# Patient Record
Sex: Female | Born: 1955 | Race: White | Hispanic: No | Smoking: Never smoker
Health system: Southern US, Community
[De-identification: ages and names within clinical notes are randomized; demographics above are authoritative.]

## PROBLEM LIST (undated history)

## (undated) DIAGNOSIS — R51 Headache with orthostatic component, not elsewhere classified: Secondary | ICD-10-CM

## (undated) DIAGNOSIS — T7840XA Allergy, unspecified, initial encounter: Secondary | ICD-10-CM

## (undated) DIAGNOSIS — F32A Depression, unspecified: Secondary | ICD-10-CM

## (undated) DIAGNOSIS — Z8669 Personal history of other diseases of the nervous system and sense organs: Secondary | ICD-10-CM

## (undated) DIAGNOSIS — E039 Hypothyroidism, unspecified: Secondary | ICD-10-CM

## (undated) DIAGNOSIS — Z9189 Other specified personal risk factors, not elsewhere classified: Secondary | ICD-10-CM

## (undated) DIAGNOSIS — Z8719 Personal history of other diseases of the digestive system: Secondary | ICD-10-CM

## (undated) DIAGNOSIS — F419 Anxiety disorder, unspecified: Secondary | ICD-10-CM

## (undated) DIAGNOSIS — J449 Chronic obstructive pulmonary disease, unspecified: Secondary | ICD-10-CM

## (undated) DIAGNOSIS — M1990 Unspecified osteoarthritis, unspecified site: Secondary | ICD-10-CM

## (undated) DIAGNOSIS — J40 Bronchitis, not specified as acute or chronic: Secondary | ICD-10-CM

## (undated) DIAGNOSIS — Z98811 Dental restoration status: Secondary | ICD-10-CM

## (undated) DIAGNOSIS — Z9109 Other allergy status, other than to drugs and biological substances: Secondary | ICD-10-CM

## (undated) DIAGNOSIS — Z9289 Personal history of other medical treatment: Secondary | ICD-10-CM

## (undated) DIAGNOSIS — M2242 Chondromalacia patellae, left knee: Secondary | ICD-10-CM

## (undated) DIAGNOSIS — K219 Gastro-esophageal reflux disease without esophagitis: Secondary | ICD-10-CM

## (undated) DIAGNOSIS — R7302 Impaired glucose tolerance (oral): Secondary | ICD-10-CM

## (undated) DIAGNOSIS — F329 Major depressive disorder, single episode, unspecified: Secondary | ICD-10-CM

## (undated) DIAGNOSIS — Z87898 Personal history of other specified conditions: Secondary | ICD-10-CM

## (undated) HISTORY — PX: WISDOM TOOTH EXTRACTION: SHX21

## (undated) HISTORY — DX: Personal history of other specified conditions: Z87.898

## (undated) HISTORY — DX: Gastro-esophageal reflux disease without esophagitis: K21.9

## (undated) HISTORY — DX: Personal history of other medical treatment: Z92.89

## (undated) HISTORY — PX: COLONOSCOPY: SHX174

## (undated) HISTORY — DX: Allergy, unspecified, initial encounter: T78.40XA

## (undated) HISTORY — PX: TONSILLECTOMY AND ADENOIDECTOMY: SHX28

## (undated) HISTORY — DX: Impaired glucose tolerance (oral): R73.02

## (undated) HISTORY — DX: Chronic obstructive pulmonary disease, unspecified: J44.9

## (undated) HISTORY — DX: Hypothyroidism, unspecified: E03.9

---

## 1984-12-12 HISTORY — PX: TUBAL LIGATION: SHX77

## 2010-06-17 LAB — PULMONARY FUNCTION TEST

## 2011-04-21 ENCOUNTER — Inpatient Hospital Stay (HOSPITAL_COMMUNITY)
Admission: AD | Admit: 2011-04-21 | Discharge: 2011-04-23 | DRG: 419 | Disposition: A | Discharge status: Home / Self Care | Payer: 59 | Source: Ambulatory Visit | Attending: Surgery | Admitting: Surgery

## 2011-04-21 DIAGNOSIS — R112 Nausea with vomiting, unspecified: Secondary | ICD-10-CM | POA: Diagnosis present

## 2011-04-21 DIAGNOSIS — R1011 Right upper quadrant pain: Secondary | ICD-10-CM | POA: Diagnosis present

## 2011-04-21 DIAGNOSIS — K8010 Calculus of gallbladder with chronic cholecystitis without obstruction: Principal | ICD-10-CM | POA: Diagnosis present

## 2011-04-21 DIAGNOSIS — E669 Obesity, unspecified: Secondary | ICD-10-CM | POA: Diagnosis present

## 2011-04-21 HISTORY — PX: CHOLECYSTECTOMY: SHX55

## 2011-04-21 LAB — CBC
HCT: 37.4 % (ref 36.0–46.0)
Hemoglobin: 12.6 g/dL (ref 12.0–15.0)
MCH: 30.9 pg (ref 26.0–34.0)
MCHC: 33.7 g/dL (ref 30.0–36.0)
MCV: 91.7 fL (ref 78.0–100.0)
Platelets: 309 10*3/uL (ref 150–400)
RBC: 4.08 MIL/uL (ref 3.87–5.11)
RDW: 12.4 % (ref 11.5–15.5)
WBC: 6.3 10*3/uL (ref 4.0–10.5)

## 2011-04-21 LAB — COMPREHENSIVE METABOLIC PANEL
ALT: 24 U/L (ref 0–35)
AST: 21 U/L (ref 0–37)
Albumin: 3.8 g/dL (ref 3.5–5.2)
Alkaline Phosphatase: 75 U/L (ref 39–117)
BUN: 18 mg/dL (ref 6–23)
CO2: 29 mEq/L (ref 19–32)
Calcium: 9.3 mg/dL (ref 8.4–10.5)
Chloride: 105 mEq/L (ref 96–112)
Creatinine, Ser: 0.73 mg/dL (ref 0.4–1.2)
GFR calc Af Amer: >60 mL/min (ref >60)
GFR calc non Af Amer: >60 mL/min (ref >60)
Glucose, Bld: 90 mg/dL (ref 70–99)
Potassium: 3.6 mEq/L (ref 3.5–5.1)
Sodium: 141 mEq/L (ref 135–145)
Total Bilirubin: 0.2 mg/dL — ABNORMAL LOW (ref 0.3–1.2)
Total Protein: 6.2 g/dL (ref 6.0–8.3)

## 2011-04-22 ENCOUNTER — Other Ambulatory Visit: Payer: Self-pay | Admitting: Surgery

## 2011-04-22 ENCOUNTER — Inpatient Hospital Stay (HOSPITAL_COMMUNITY): Payer: 59

## 2011-04-22 LAB — SURGICAL PCR SCREEN
MRSA, PCR: NEGATIVE
Staphylococcus aureus: NEGATIVE

## 2011-04-22 NOTE — Op Note (Signed)
[name] [name], [name] [name]             ACCOUNT NO.:  [id number]  MEDICAL RECORD NO.:  [id number]           PATIENT TYPE:  I  LOCATION:  1528                         FACILITY:  [facility]  PHYSICIAN:  Sandria Bales. Ezzard Standing, M.D.  DATE OF BIRTH:  Jan 09, 1956  DATE OF PROCEDURE:  04/21/2011 DATE OF DISCHARGE:                              OPERATIVE REPORT   Tis is an unedited report.  For final edited dictation, please contact [facility]/Liz Katrinka Blazing.  PREOPERATIVE DIAGNOSES:  Cholecystitis, cholelithiasis.  POSTOPERATIVE DIAGNOSIS:  Edematous cholecystitis with cholelithiasis.  PROCEDURE:  Laparoscopic cholecystectomy with intraoperative cholangiogram.  SURGEON:  Sandria Bales. Ezzard Standing, M.D.  FIRST ASSISTANT:  Dr. Magnus Ivan.  ANESTHESIA:  General endotracheal.  ESTIMATED BLOOD LOSS:  Minimal.  INDICATIONS FOR PROCEDURE:  Ms. Sol is a 55 year old white female who sees Dr. Madelyn Flavors of [place], [place] as per primary medical doctor and has seen Dr. Tillman Sers of [facility]. She had epigastric left upper quadrant abdominal pain and has been found to have symptomatic cholelithiasis.  She originally was to have surgery in [place], because of insurance issues is being cared for in [place].  She presented to our urgent office with acute abdominal pain yesterday, was admitted by Dr. Magnus Ivan and now comes for attempted laparoscopic cholecystectomy.  The indications, potential complications of gallbladder surgery were explained to the patient.  Potential complications include but are not limited to bleeding, infection, bile duct injury and possibility of open surgery.  OPERATIVE NOTE:  The patient was placed in a supine position.  Her abdomen was prepped with ChloraPrep.  She was sterilely draped.  She was in room #6.    A time-out was held and surgical checklist run.  I made an infraumbilical incision with sharp dissection carried down the abdominal  cavity.  A 12-mm Hassan trocar was secured with a 0 Vicryl suture.  I then placed an 11-mm trocar in subxiphoid location, 5-mm in the right mid subcostal and 5 mm in the right lateral subcostal location.  I visualized the right and left lobes of liver, unremarkable.  The stomach was unremarkable.  The bowel that I could see was unremarkable.  Her gallbladder was somewhat whitish and had some edema consistent with acute edematous cholecystitis.  I identified the gallbladder and cystic duct junction.  I divided the cystic artery with 2 clips.  I placed a clip on the gallbladder side of the cystic duct and shot intraoperative cholangiogram.  The cholangiogram was shot using a cutoff taut catheter inserted through a 14-gauge Jelco catheter and into the side of the cut cystic duct.  The catheter was secured with an Endoclip.  I used about 15 cc of half- strength Hypaque solution which showed free flow down the cystic duct into the common bile duct up the hepatic radicals and down into the duodenum.  There was no filling defect, mass or leak and this was feltto be normal intraoperative cholangiogram.  The taut catheter was then removed.  The cystic duct triply clipped and divided.  The patient had another branch of the cystic artery which was posterior which I doubly clipped and divided, and  the gallbladder sharply and bluntly dissected from the gallbladder bed using primarily hook Bovie coagulation.  She did have edema of the gallbladder wall.  I reinspected the gallbladder wall and the triangle of Calot at the end of the procedure and there was no bleeding or bile leak from either these areas.  The gallbladder was placed in EndoCatch bag and delivered through the umbilicus.  It had in it several large stones probably 1 to 1.5 cm in size.  There was a single adhesive band to the underneath surface of the umbilicus may have from a prior tubal ligation.  This was divided.  The trocars were  then removed in turn.  There was no bleeding in any trocar site.  The umbilical port was closed with a 0 Vicryl suture.  The skin at each port closed with 5-0 Vicryl suture, painted with Dermabond sterilely dressed.  The patient tolerated the procedure well, was transferred to recovery room in good condition.  I used about 30 cc of 0.25% Marcaine as a local anesthetic at the incision sites.  Her final sponge and needle counts were correct at the end of the case.  She wants to try to go home today.  So we will see how she does in recovery and then go from there.   Sandria Bales. Ezzard Standing, M.D., FACS   DHN/MEDQ  D:  04/22/2011  T:  04/22/2011  Job:  [id number]  cc:   Dr. Madelyn Flavors, [place], [place]  Dr. Tillman Sers, [facility]  Electronically Signed by Ovidio Kin M.D. on 04/22/2011 03:39:03 PM

## 2011-04-27 NOTE — H&P (Signed)
  [name] [name], [name] [name]             ACCOUNT NO.:  [id number]  MEDICAL RECORD NO.:  [id number]           PATIENT TYPE:  I  LOCATION:  1528                         FACILITY:  [facility]  PHYSICIAN:  Abigail Miyamoto, M.D. DATE OF BIRTH:  07-Nov-1956  DATE OF ADMISSION:  04/21/2011 DATE OF DISCHARGE:                             HISTORY & PHYSICAL   CHIEF COMPLAINT:  Right upper quadrant abdominal pain, nausea and vomiting.  HISTORY:  This is a 55 year old female with known gallstones.  She was actually scheduled to have surgery in [place], [place] next week. She, however, has become acutely ill.  She works in the [employer], has presented here emergently.  She has had several attacks since she has been diagnosed of gallstones, but this is the worse one yet.  Her pain has been unrelenting in the epigastric area, it does actually hurt to her left side.  She has had nausea and vomiting with this.  Otherwise, she is without complaints.  PAST MEDICAL HISTORY: 1. Bronchiectasis. 2. Sliding hiatal hernia. 3. Diverticulosis. 4. Gastritis. 5. She has had T and A as well as bilateral tubal ligation.  MEDICATIONS:  Singulair, Allegra, Nasonex, Robinul, multivitamins, etc.  ALLERGIES:  No known drug allergies.  FAMILY HISTORY:  Noncontributory.  SOCIAL HISTORY:  She does not smoke, does drink alcohol.  REVIEW OF SYSTEMS:  A 16-point review of systems signed on chart has been reviewed with the patient.  It is negative from cardiopulmonary standpoint except for bronchiectasis.  PHYSICAL EXAMINATION:  GENERAL:  This is an ill-appearing female, in no acute distress. HEENT:  Height 5 feet 1inch, weight 193 pounds, blood pressure is 145/88, pulse 67, temperature 98.0.  Eyes, anicteric.  Pupils reactive bilaterally.  ENT, external ears, nose are normal.  Hearing is normal. Oropharynx is clear. NECK:  Supple.  There is no cervical adenopathy.  There is no thyromegaly. LUNGS:  Clear  to auscultation bilaterally.  Normal respiratory effort. CARDIOVASCULAR:  Regular rate and rhythm.  There are no murmurs.  There is no peripheral edema. ABDOMEN:  Soft.  There is tenderness with guarding in the epigastrium and right upper quadrant.  There are no hernias.  There is no organomegaly. EXTREMITIES:  Warm and well perfused.  No edema, clubbing, or cyanosis.  DATA REVIEWED:  I have her notes from [place], [place].  These include CAT scan and ultrasound showing to have cholelithiasis.  Most of these were dated from September last year.  IMPRESSION:  This is a patient with cholelithiasis and I suspect acute cholecystitis.  At this point, she is being admitted to the hospital.  I will place her on IV antibiotics and I will get a CBC and CMP.  I have notified Dr. Ovidio Kin for admission, no doubt she will need a laparoscopic cholecystectomy this admission.     Abigail Miyamoto, M.D.     DB/MEDQ  D:  04/21/2011  T:  04/22/2011  Job:  [id number]  Electronically Signed by Abigail Miyamoto M.D. on 04/27/2011 05:39:40 PM

## 2011-05-03 ENCOUNTER — Emergency Department (HOSPITAL_COMMUNITY)
Admission: EM | Admit: 2011-05-03 | Discharge: 2011-05-03 | Disposition: A | Discharge status: Home / Self Care | Payer: No Typology Code available for payment source | Attending: Emergency Medicine | Admitting: Emergency Medicine

## 2011-05-03 DIAGNOSIS — R52 Pain, unspecified: Secondary | ICD-10-CM | POA: Insufficient documentation

## 2011-05-03 DIAGNOSIS — J4489 Other specified chronic obstructive pulmonary disease: Secondary | ICD-10-CM | POA: Insufficient documentation

## 2011-05-03 DIAGNOSIS — Z043 Encounter for examination and observation following other accident: Secondary | ICD-10-CM | POA: Insufficient documentation

## 2011-05-03 DIAGNOSIS — J449 Chronic obstructive pulmonary disease, unspecified: Secondary | ICD-10-CM | POA: Insufficient documentation

## 2011-05-10 NOTE — Discharge Summary (Signed)
  NAMEJOURNIE, [name] [name]             ACCOUNT NO.:  [id number]  MEDICAL RECORD NO.:  [id number]           PATIENT TYPE:  I  LOCATION:  1528                         [facility]ACILITY:  [facility]  PHYSICIAN:  Sandria Bales. Ezzard Standing, M.D.  DATE OF BIRTH:  [birth year]  DATE OF ADMISSION:  04/21/2011 DATE OF DISCHARGE:  04/23/2011                              DISCHARGE SUMMARY  DISCHARGE DIAGNOSES: 1. Cholecystitis with cholelithiasis. 2. Bronchiectasis. 3. Sliding hiatal hernia. 4. History of diverticulosis. 5. History of gastritis.  OPERATIONS PERFORMED:  The patient underwent a laparoscopic cholecystectomy with intraoperative cholangiogram by Dr. Ovidio Kin on Apr 22, 2011.  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 Madelyn Flavors in [place], [place] as her primary medical doctor, sees Dr. Allena Katz in [place], [place], from a GI standpoint.  She has had left upper quadrant abdominal pain.  She has been through a colonoscopy, endoscopy, CT scans which showed no other specific cause for her symptoms and has known gallstones.  She was originally scheduled for surgery in [place] by a surgeon in that area, but her insurance would not cover his care.She then came and was seen by Dr. Magnus Ivan at [facility] for symptomatic cholelithiasis, possible cholecystitis.  Since he was coming off a call, and I was on-call, I took care of [name] [name].  So, on Apr 22, 2011, she was taken to the operating room where she underwent a laparoscopic cholecystectomy with intraoperative cholangiogram.  She did have an edematous cholecystitis.  By Apr 23, 2011, she was afebrile.  Her nausea which she had early after surgery resolved and she was ready for discharge.  DISCHARGE INSTRUCTIONS:   Included that she should not drive for 2 or 3 days.   She is to return to work on Apr 29, 2011.     She is on low-fat diet.  She will start showering on Sunday, Apr 24, 2011.  She is to see me back in 2 or 3 weeks for followup.  DISCHARGE MEDICATIONS:  She could resume her home medicines which included, acidophilus, albuterol, Allegra, Antivert, Dexilant, Lexapro, Nasonex, Robinul Forte, Singulair, and then she uses vitamins over-the- counter such as B12, and then Zantac.  Her final pathology showed cholelithiasis with cholecystitis.  Her discharge condition was good.   Sandria Bales. Ezzard Standing, M.D., FACS   DHN/MEDQ  D:  05/09/2011  T:  05/09/2011  Job:  [id number]  cc:   Dr. Marisue [place], [place]  Dr. Tillman Sers  Electronically Signed by Ovidio Kin M.D. on 05/10/2011 07:47:43 AM

## 2011-05-21 ENCOUNTER — Emergency Department (HOSPITAL_COMMUNITY)
Admission: EM | Admit: 2011-05-21 | Discharge: 2011-05-21 | Disposition: A | Discharge status: Home / Self Care | Payer: 59 | Attending: Emergency Medicine | Admitting: Emergency Medicine

## 2011-05-21 DIAGNOSIS — J449 Chronic obstructive pulmonary disease, unspecified: Secondary | ICD-10-CM | POA: Insufficient documentation

## 2011-05-21 DIAGNOSIS — I808 Phlebitis and thrombophlebitis of other sites: Secondary | ICD-10-CM | POA: Insufficient documentation

## 2011-05-21 DIAGNOSIS — J4489 Other specified chronic obstructive pulmonary disease: Secondary | ICD-10-CM | POA: Insufficient documentation

## 2012-05-03 ENCOUNTER — Emergency Department (HOSPITAL_COMMUNITY)
Admission: EM | Admit: 2012-05-03 | Discharge: 2012-05-03 | Disposition: A | Discharge status: Nursing Home (Includes ICF) | Payer: 59 | Source: Home / Self Care | Attending: Family Medicine | Admitting: Family Medicine

## 2012-05-03 ENCOUNTER — Emergency Department (INDEPENDENT_AMBULATORY_CARE_PROVIDER_SITE_OTHER): Payer: 59

## 2012-05-03 ENCOUNTER — Encounter (HOSPITAL_COMMUNITY): Payer: Self-pay | Admitting: *Deleted

## 2012-05-03 ENCOUNTER — Emergency Department (HOSPITAL_COMMUNITY)
Admission: EM | Admit: 2012-05-03 | Discharge: 2012-05-03 | Disposition: A | Discharge status: Home / Self Care | Payer: 59 | Attending: Emergency Medicine | Admitting: Emergency Medicine

## 2012-05-03 DIAGNOSIS — J189 Pneumonia, unspecified organism: Secondary | ICD-10-CM | POA: Insufficient documentation

## 2012-05-03 DIAGNOSIS — J470 Bronchiectasis with acute lower respiratory infection: Secondary | ICD-10-CM

## 2012-05-03 DIAGNOSIS — J471 Bronchiectasis with (acute) exacerbation: Secondary | ICD-10-CM

## 2012-05-03 DIAGNOSIS — R05 Cough: Secondary | ICD-10-CM | POA: Insufficient documentation

## 2012-05-03 DIAGNOSIS — R059 Cough, unspecified: Secondary | ICD-10-CM | POA: Insufficient documentation

## 2012-05-03 DIAGNOSIS — R0602 Shortness of breath: Secondary | ICD-10-CM | POA: Insufficient documentation

## 2012-05-03 LAB — CBC
HCT: 39.7 % (ref 36.0–46.0)
Hemoglobin: 13.5 g/dL (ref 12.0–15.0)
MCH: 31.7 pg (ref 26.0–34.0)
MCHC: 34 g/dL (ref 30.0–36.0)
MCV: 93.2 fL (ref 78.0–100.0)
Platelets: 299 10*3/uL (ref 150–400)
RBC: 4.26 MIL/uL (ref 3.87–5.11)
RDW: 13.2 % (ref 11.5–15.5)
WBC: 5.4 10*3/uL (ref 4.0–10.5)

## 2012-05-03 LAB — DIFFERENTIAL
Basophils Absolute: 0 10*3/uL (ref 0.0–0.1)
Basophils Relative: 0 % (ref 0–1)
Eosinophils Absolute: 0 10*3/uL (ref 0.0–0.7)
Eosinophils Relative: 1 % (ref 0–5)
Lymphocytes Relative: 31 % (ref 12–46)
Lymphs Abs: 1.7 10*3/uL (ref 0.7–4.0)
Monocytes Absolute: 0.3 10*3/uL (ref 0.1–1.0)
Monocytes Relative: 5 % (ref 3–12)
Neutro Abs: 3.4 10*3/uL (ref 1.7–7.7)
Neutrophils Relative %: 63 % (ref 43–77)

## 2012-05-03 LAB — BASIC METABOLIC PANEL
BUN: 14 mg/dL (ref 6–23)
CO2: 29 mEq/L (ref 19–32)
Calcium: 9.1 mg/dL (ref 8.4–10.5)
Chloride: 102 mEq/L (ref 96–112)
Creatinine, Ser: 0.83 mg/dL (ref 0.50–1.10)
GFR calc Af Amer: 90 mL/min — ABNORMAL LOW (ref >90)
GFR calc non Af Amer: 77 mL/min — ABNORMAL LOW (ref >90)
Glucose, Bld: 96 mg/dL (ref 70–99)
Potassium: 4 mEq/L (ref 3.5–5.1)
Sodium: 139 mEq/L (ref 135–145)

## 2012-05-03 LAB — LACTIC ACID, PLASMA: Lactic Acid, Venous: 0.5 mmol/L (ref 0.5–2.2)

## 2012-05-03 MED ORDER — IPRATROPIUM BROMIDE 0.02 % IN SOLN
0.5000 mg | Freq: Once | RESPIRATORY_TRACT | Status: AC
  Administered 2012-05-03: 0.5 mg via RESPIRATORY_TRACT
  Filled 2012-05-03: qty 2.5

## 2012-05-03 MED ORDER — DEXTROSE 5 % IV SOLN
1.0000 g | Freq: Once | INTRAVENOUS | Status: AC
  Administered 2012-05-03: 1 g via INTRAVENOUS
  Filled 2012-05-03: qty 10

## 2012-05-03 MED ORDER — ALBUTEROL SULFATE (5 MG/ML) 0.5% IN NEBU
5.0000 mg | INHALATION_SOLUTION | Freq: Once | RESPIRATORY_TRACT | Status: AC
  Administered 2012-05-03: 5 mg via RESPIRATORY_TRACT
  Filled 2012-05-03: qty 1

## 2012-05-03 MED ORDER — AZITHROMYCIN 250 MG PO TABS
500.0000 mg | ORAL_TABLET | Freq: Once | ORAL | Status: AC
  Administered 2012-05-03: 500 mg via ORAL
  Filled 2012-05-03: qty 2

## 2012-05-03 MED ORDER — CLARITHROMYCIN ER 500 MG PO TB24: 1000.0000 mg | ORAL_TABLET | Freq: Every day | ORAL | Status: AC

## 2012-05-03 MED ORDER — SODIUM CHLORIDE 0.9 % IV SOLN
INTRAVENOUS | Status: DC
  Administered 2012-05-03: 21:00:00 via INTRAVENOUS

## 2012-05-03 NOTE — Discharge Instructions (Signed)

## 2012-05-03 NOTE — ED Notes (Signed)
Productive cough thick white

## 2012-05-03 NOTE — ED Notes (Signed)
Pt  Reports  Symptoms  Of   Cough  /  Congestion   Shortness  Of  Breath    For  About 4  Days   She      Has  Been talking  Her  Nebs  But  Has  Not cleared       Lung sounds  Are  Tight     She is  Sitting  Upright on  Exam table  In somewhat   Mod  Distress

## 2012-05-03 NOTE — ED Notes (Signed)
ucc transfer she has been ill for 7 days with a cough and feeling well low grade temp.  She was diagnosed with pneumonia today and sent here for further treatment

## 2012-05-03 NOTE — ED Notes (Signed)
Pt pulse ox while ambulating was 95-96% RA

## 2012-05-03 NOTE — ED Provider Notes (Signed)
History     CSN: [id number]  Arrival date & time 05/03/12  1605   First MD Initiated Contact with Patient 05/03/12 1611      Chief Complaint  Patient presents with  . Cough    (Consider location/radiation/quality/duration/timing/severity/associated sxs/prior treatment) Patient is a 56 y.o. female presenting with cough. The history is provided by the patient.  Cough This is a chronic problem. The current episode started more than 2 days ago. The problem has been gradually worsening. The cough is productive of sputum. There has been no fever. Associated symptoms include shortness of breath and wheezing. Treatments tried: home neb rx qid. The treatment provided no relief. She is not a smoker. Her past medical history is significant for bronchiectasis and COPD. Past medical history comments: 2 flares this year per pulmonologist in [place]..    Past Medical History  Diagnosis Date  . Bronchiectasis     Past Surgical History  Procedure Date  . Cholecystectomy     No family history on file.  History  Substance Use Topics  . Smoking status: Not on file  . Smokeless tobacco: Not on file  . Alcohol Use:     OB History    Grav Para Term Preterm Abortions TAB SAB Ect Mult Living                  Review of Systems  Constitutional: Negative.   HENT: Negative.   Respiratory: Positive for cough, shortness of breath and wheezing.     Allergies  Levaquin  Home Medications   Current Outpatient Rx  Name Route Sig Dispense Refill  . BUDESONIDE 0.5 MG/2ML IN SUSP Nebulization Take 0.5 mg by nebulization 2 (two) times daily.    Marland Kitchen CLARITHROMYCIN 500 MG PO TABS Oral Take 500 mg by mouth 2 (two) times daily.    Marland Kitchen FEXOFENADINE HCL 180 MG PO TABS Oral Take 180 mg by mouth daily.    . GUAIFENESIN 100 MG/5ML PO LIQD Oral Take 200 mg by mouth 3 (three) times daily as needed.    . IPRATROPIUM-ALBUTEROL 0.5-2.5 (3) MG/3ML IN SOLN Nebulization Take 3 mLs by nebulization.    . MOMETASONE  FUROATE 50 MCG/ACT NA SUSP Nasal Place 2 sprays into the nose daily.    Marland Kitchen OMEPRAZOLE 20 MG PO CPDR Oral Take 20 mg by mouth daily.    Marland Kitchen PREDNISONE 10 MG PO TABS Oral Take 10 mg by mouth daily.      BP 157/98  Pulse 82  Temp(Src) 99.1 F (37.3 C) (Oral)  Resp 20  SpO2 97%  Physical Exam  Nursing note and vitals reviewed. Constitutional: She appears well-developed and well-nourished.  HENT:  Head: Normocephalic.  Right Ear: External ear normal.  Left Ear: External ear normal.  Nose: Nose normal.  Mouth/Throat: Oropharynx is clear and moist.  Neck: Normal range of motion. Neck supple.  Cardiovascular: Regular rhythm and normal heart sounds.   Pulmonary/Chest: She has decreased breath sounds. She has wheezes.  Lymphadenopathy:    She has no cervical adenopathy.  Skin: Skin is warm and dry.    ED Course  Procedures (including critical care time)  Labs Reviewed - No data to display Dg Chest 2 View  05/03/2012  *RADIOLOGY REPORT*  Clinical Data: Persistent cough.  CHEST - 2 VIEW  Comparison: None.  Findings: Trachea is midline.  Heart size normal.  There is right basilar airspace disease.  Lungs are otherwise clear.  No pleural fluid.  IMPRESSION: Bibasilar pneumonia.  Follow-up  to clearing is recommended.  Original Report Authenticated By: Reyes Ivan, M.D.     1. Bronchiectasis with acute lower respiratory infection       MDM          Linna Hoff, MD 05/03/12 [phone]

## 2012-05-04 NOTE — ED Provider Notes (Signed)
History     CSN: [id number]  Arrival date & time 05/03/12  1739   First MD Initiated Contact with Patient 05/03/12 1823      Chief Complaint  Patient presents with  . Pneumonia    (Consider location/radiation/quality/duration/timing/severity/associated sxs/prior treatment) HPI Comments: [name] [name] is a 56 y.o. Female who has had a cough for 1 week, with SOB. No fever, N/V, weakness dizziness or CP. Similar illness x 2 in last 4 mos. Treated with Biaxin. Using usual home meds without relief. SoB worse with ambulation. No orthopnea  Patient is a 56 y.o. female presenting with pneumonia. The history is provided by the patient.  Pneumonia    Past Medical History  Diagnosis Date  . Bronchiectasis     Past Surgical History  Procedure Date  . Cholecystectomy     No family history on file.  History  Substance Use Topics  . Smoking status: Never Smoker   . Smokeless tobacco: Not on file  . Alcohol Use: No    OB History    Grav Para Term Preterm Abortions TAB SAB Ect Mult Living                  Review of Systems  All other systems reviewed and are negative.    Allergies  Levaquin  Home Medications   Current Outpatient Rx  Name Route Sig Dispense Refill  . BUDESONIDE 0.5 MG/2ML IN SUSP Nebulization Take 0.5 mg by nebulization 2 (two) times daily.    Marland Kitchen FEXOFENADINE HCL 180 MG PO TABS Oral Take 180 mg by mouth daily.    . GUAIFENESIN 100 MG/5ML PO LIQD Oral Take 200 mg by mouth 3 (three) times daily as needed. For cough    . IPRATROPIUM-ALBUTEROL 0.5-2.5 (3) MG/3ML IN SOLN Nebulization Take 3 mLs by nebulization every 4 (four) hours as needed. For shortness of breath    . MOMETASONE FUROATE 50 MCG/ACT NA SUSP Nasal Place 2 sprays into the nose daily.    Marland Kitchen OMEPRAZOLE 20 MG PO CPDR Oral Take 20 mg by mouth daily.    Marland Kitchen CLARITHROMYCIN ER 500 MG PO TB24 Oral Take 2 tablets (1,000 mg total) by mouth daily. 20 tablet 0    BP 134/78  Pulse 80  Temp(Src) 98.9 F  (37.2 C) (Oral)  Resp 24  SpO2 96%  Physical Exam  Nursing note and vitals reviewed. Constitutional: She is oriented to person, place, and time. She appears well-developed and well-nourished.  HENT:  Head: Normocephalic and atraumatic.  Eyes: Conjunctivae and EOM are normal. Pupils are equal, round, and reactive to light.  Neck: Normal range of motion and phonation normal. Neck supple.  Cardiovascular: Normal rate, regular rhythm and intact distal pulses.   Pulmonary/Chest: Effort normal. She has wheezes (scattered). She has no rales. She exhibits no tenderness.       Decreased BS bilateral.  Abdominal: Soft. She exhibits no distension. There is no tenderness. There is no guarding.  Musculoskeletal: Normal range of motion.  Neurological: She is alert and oriented to person, place, and time. She has normal strength. She exhibits normal muscle tone.  Skin: Skin is warm and dry.  Psychiatric: She has a normal mood and affect. Her behavior is normal. Judgment and thought content normal.    ED Course  Procedures (including critical care time)  ED Treatment: IVF, Neb, Rocephin and Zithromax.   Reevaluation:Repeat vitals and O2 Sat are normal. Pt feels better.    Labs Reviewed  BASIC METABOLIC PANEL -  Abnormal; Notable for the following:    GFR calc non Af Amer 77 (*)    GFR calc Af Amer 90 (*)    All other components within normal limits  CBC  DIFFERENTIAL  LACTIC ACID, PLASMA  LAB REPORT - SCANNED   Dg Chest 2 View  05/03/2012  *RADIOLOGY REPORT*  Clinical Data: Persistent cough.  CHEST - 2 VIEW  Comparison: None.  Findings: Trachea is midline.  Heart size normal.  There is right basilar airspace disease.  Lungs are otherwise clear.  No pleural fluid.  IMPRESSION: Bibasilar pneumonia.  Follow-up to clearing is recommended.  Original Report Authenticated By: Reyes Ivan, M.D.     1. Community acquired pneumonia       MDM  Recurrent PNE with Bronchospasm Pt is on  full spectrum treatment for COPD. She improved with a single Neb in ED so I think Steroids are not indicated. She has Nebs at home, and access to f/u care with her Pulmonologist.Doubt metabolic instability, serious bacterial infection or impending vascular collapse; the patient is stable for discharge.    Plan: Home Medications- Biaxin; Home Treatments- rest; Recommended follow up- PCP or Pulm. If not better in 2-3 days.       Flint Melter, MD 05/04/12 [phone]

## 2012-05-14 ENCOUNTER — Telehealth: Payer: Self-pay | Admitting: Pulmonary Disease

## 2012-05-14 NOTE — Telephone Encounter (Signed)
Thanks for giving me the option of working her in sooner.  If one of the other guys have openings sooner, go ahead and get her in and taken care of.

## 2012-05-14 NOTE — Telephone Encounter (Signed)
Kc pls advise if you want to work this pt in for you or see if someone else has a sooner appt time.

## 2012-05-15 NOTE — Telephone Encounter (Signed)
Spoke with Patsy and notified that first available sooner appt is 05-31-12 with MR. Appt was scheduled and nothing further needed.

## 2012-05-16 ENCOUNTER — Encounter: Payer: Self-pay | Admitting: *Deleted

## 2012-05-16 ENCOUNTER — Encounter: Payer: Self-pay | Admitting: Internal Medicine

## 2012-05-16 ENCOUNTER — Ambulatory Visit (INDEPENDENT_AMBULATORY_CARE_PROVIDER_SITE_OTHER): Payer: 59 | Admitting: Internal Medicine

## 2012-05-16 VITALS — BP 130/82 | HR 88 | Temp 98.6°F | Ht 62.0 in | Wt 211.2 lb

## 2012-05-16 DIAGNOSIS — J449 Chronic obstructive pulmonary disease, unspecified: Secondary | ICD-10-CM

## 2012-05-16 DIAGNOSIS — R059 Cough, unspecified: Secondary | ICD-10-CM

## 2012-05-16 DIAGNOSIS — R05 Cough: Secondary | ICD-10-CM | POA: Insufficient documentation

## 2012-05-16 DIAGNOSIS — R053 Chronic cough: Secondary | ICD-10-CM | POA: Insufficient documentation

## 2012-05-16 DIAGNOSIS — K219 Gastro-esophageal reflux disease without esophagitis: Secondary | ICD-10-CM | POA: Insufficient documentation

## 2012-05-16 DIAGNOSIS — J45909 Unspecified asthma, uncomplicated: Secondary | ICD-10-CM | POA: Insufficient documentation

## 2012-05-16 NOTE — Progress Notes (Signed)
Subjective:    Patient ID: [name] [name], female    DOB: 1956/03/25, 56 y.o.   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   reports that she has never smoked. She does not have any smokeless tobacco history on file. Body mass index is 38.63 kg/(m^2). pcp is HARRIS,SYDNEY M, MD Parents died in age 50s from lung caner and brother in 62s from copd. She has pulmonary Dr. Wandra Mannan in [place], [place] but because she is [employer] employee wishes to establish at [place] as primary now.  Her referring notes indicate copd as a diagnosis  Note: she appears anxious and instructions and questions had to be repeated several times.   Reports 2 primary problems - cough and dyspnea   Cough  - all her life from childhood. She is known as the cough lady. Severe at baseline. Mostly dry but can force sputum out when needed that is clear and scanty. Cough is severe. Day and night. Associated feeling of tickle in throat +. Has associated sinus issues - has ENT specialist in [place] for > 20 years. USes all anti sinus measures. Has periodic flares of sinus that will worsen ccough and cause long recovery to baseline. Also, with positive allergy hx NOS in  sinus. Sees visiting [facility] Dr Barb Merino - gives hx of allergy shots in 2012 fall but on hold due to reactions to it.  Also, significant GERD +. Reports hx of severe hiatal hernia. Unclear if nissen fundoplication was ever discused. In terms of pulmonary, recollect ct chest and pft and reportedly shows bronchiectasis diagnosis in 2011. Recollects bronch in 2011 that really helped her; describing aspiration of casts. SHe has frequent infectious exacerbations that make cough worse; most recently 05/02/12 CXR has RLL infiltrate and given antibiotics. 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    Dr  Gretta Cool Reflux Symptom Index (> 13-15 suggestive of LPR cough) 0 -> 5  =  none ->severe problem  Hoarseness of problem with voice 5  Clearing  Of Throat 5  Excess throat mucus or feeling of post nasal drip 5  Difficulty swallowing food, liquid or tablets 0  Cough after eating or lying down 3  Breathing difficulties or choking episodes 4  Troublesome or annoying cough 5  Sensation of something sticking in throat or lump in throat 3  Heartburn, chest pain, indigestion, or stomach acid coming up 4  TOTAL 32      Kouffman Reflux v Neurogenic Cough Differentiator Reflux Neurogenic Comments  Do you awaken from a sound sleep coughing violently?                            With trouble breathing? Yes    Do you have choking episodes when you cannot  Get enough air, gasping for air ?                  Do you usually cough when you lie down into  The bed, or when you just lie down to rest ?                              Do you usually cough after meals or eating?  Do you cough when (or after) you bend over?    Yes    Do you more-or-less cough all day long?  Yes   Does change of temperature make you cough?  Yes   Does laughing or chuckling cause you to cough?  YEs   Do fumes (perfume, automobile fumes, burned  Toast, etc.,) cause you to cough ?       Yes   Does speaking, singing, or talking on the phone cause you to cough   ?                Yes   Total 2 5        Dyspnea - chronic. She is not sure if this is related to obesity. Stable. Class 3 acgtivities like walking stairs brings it on. No relieving factors. No associated chest pain. THere is strong family hx of COPD _ 2 female members died in age 20s from COPD. COPD CAT score is 30 out of 40 and reflects high symptom burden       [facility]  05/03/12  - RLL infiltrate (personally reviewed)    CAT COPD Symptom and Quality of Life Score ([employer])  0 (no burden) to 5 (highest burden)  Never  Cough -> Cough all the time 5  No phlegm in chest -> Chest is full of phlegm 5  No chest tightness -> Chest feels very tight 5  No dyspnea for 1 flight stairs/hill -> Very dyspneic for 1 flight of stairs 4  No limitations for ADL at home -> Very limited with ADL at home 5  Confident leaving home -> Not at all confident leaving home 2  Sleep soundly -> Do not sleep soundly because of lung condition 4  Lots of Energy -> No energy at all 0  TOTAL Score (max 40)  30     Past Medical History  Diagnosis Date  . Bronchiectasis   . COPD (chronic obstructive pulmonary disease)   . Hypoxemia   . GERD (gastroesophageal reflux disease)      Family History  Problem Relation Age of Onset  . Lung cancer Father 52    dies age 66  . Emphysema Father   . COPD Brother 62    died age 57  . Stroke Mother      History   Social History  . Marital Status: Married    Spouse Name: N/A    Number of Children: N/A  . Years of Education: N/A   Occupational History  . nurse [employer]   Social History Main Topics  . Smoking status: Never Smoker   . Smokeless tobacco: Not on file  . Alcohol Use: No  . Drug Use: No  . Sexually Active: Not on file   Other Topics Concern  . Not on file   Social History Narrative  . No narrative on file     Allergies  Allergen Reactions  . Levaquin (Levofloxacin)     Pain in muscles     Outpatient Prescriptions Prior to Visit  Medication Sig Dispense Refill  . budesonide (PULMICORT) 0.5 MG/2ML nebulizer solution Take 0.5 mg by nebulization 2 (two) times daily.      . fexofenadine (ALLEGRA) 180 MG tablet Take 180 mg by mouth daily.      Marland Kitchen guaiFENesin (ROBITUSSIN) 100 MG/5ML liquid Take 200 mg by mouth 3 (three) times daily as needed. For cough      . ipratropium-albuterol (DUONEB) 0.5-2.5 (  3) MG/3ML SOLN Take 3 mLs by nebulization every 4 (four) hours as needed. For shortness of breath      . mometasone (NASONEX) 50 MCG/ACT nasal spray Place 2 sprays  into the nose daily.      Marland Kitchen omeprazole (PRILOSEC) 20 MG capsule Take 20 mg by mouth daily.             Review of Systems  Constitutional: Negative for fever and unexpected weight change.  HENT: Negative for ear pain, nosebleeds, congestion, sore throat, rhinorrhea, sneezing, trouble swallowing, dental problem, postnasal drip and sinus pressure.   Eyes: Negative for redness and itching.  Respiratory: Positive for cough, chest tightness and shortness of breath. Negative for wheezing.   Cardiovascular: Negative for palpitations and leg swelling.  Gastrointestinal: Negative for nausea and vomiting.  Genitourinary: Negative for dysuria.  Musculoskeletal: Negative for joint swelling.  Skin: Negative for rash.  Neurological: Negative for headaches.  Hematological: Does not bruise/bleed easily.  Psychiatric/Behavioral: Negative for dysphoric mood. The patient is not nervous/anxious.        Objective:   Physical Exam  Vitals reviewed. Constitutional: She is oriented to person, place, and time. She appears well-developed and well-nourished. No distress.       Body mass index is 38.63 kg/(m^2).   HENT:  Head: Normocephalic and atraumatic.  Right Ear: External ear normal.  Left Ear: External ear normal.  Mouth/Throat: Oropharynx is clear and moist. No oropharyngeal exudate.       Barking quality to cough +  Eyes: Conjunctivae and EOM are normal. Pupils are equal, round, and reactive to light. Right eye exhibits no discharge. Left eye exhibits no discharge. No scleral icterus.  Neck: Normal range of motion. Neck supple. No JVD present. No tracheal deviation present. No thyromegaly present.  Cardiovascular: Normal rate, regular rhythm, normal heart sounds and intact distal pulses.  Exam reveals no gallop and no friction rub.   No murmur heard. Pulmonary/Chest: Effort normal and breath sounds normal. No respiratory distress. She has no wheezes. She has no rales. She exhibits no tenderness.    Abdominal: Soft. Bowel sounds are normal. She exhibits no distension and no mass. There is no tenderness. There is no rebound and no guarding.  Musculoskeletal: Normal range of motion. She exhibits no edema and no tenderness.  Lymphadenopathy:    She has no cervical adenopathy.  Neurological: She is alert and oriented to person, place, and time. She has normal reflexes. No cranial nerve deficit. She exhibits normal muscle tone. Coordination normal.  Skin: Skin is warm and dry. No rash noted. She is not diaphoretic. No erythema. No pallor.  Psychiatric: She has a normal mood and affect. Her behavior is normal. Judgment and thought content normal.          Assessment & Plan:

## 2012-05-16 NOTE — Assessment & Plan Note (Signed)
Appears to have significant GERD. There is also hx of hiatal hernia. Cough will not improve unless GERD is controlled. Needs GI eval. Will refer to Dr Erick Blinks GI

## 2012-05-16 NOTE — Assessment & Plan Note (Addendum)
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 I counseled her on this but am not sure she understood it well. Sinus issues, GERD and possibly obstructive lung disease are the fuel fanning the fire of cough wit the cough itself having a life of its own in the form of LPR cough.  In order to address this effectively and resolve cough, I need her cooperation with Rx compliance: she has agreed to do it but am not sure; only time will tell  For sinus issues: need her ENT doctor notes, till then continue current Rx. At some point need to consider allergy eval  For GERD: refer [facility] gI  For hx of obstructive lung disease: need outside CT chest cd rom and outside PFT. Repeat cxr and pft in 6 weeks  For recent pulm infitlrate: repeat cxr 6 weeks  Ok to work: she is not contagious

## 2012-05-16 NOTE — Assessment & Plan Note (Signed)
Referring notes say she has copd. She says she has bronchiectasis. I need to know her pft and ct scans. I have asked her to bring CD Rom of her CT chest and also we need prior PFTs to sort out what she has

## 2012-05-16 NOTE — Patient Instructions (Addendum)
Your cough is from sinus, acid reflux and bronchiectasis  In addition you have LPR or cyclical cough which means cough has taken a life of its own Please follow all sinus Rx and bronchiectasis Rx like you have done so far Finish your prednisone course and return to work as scheduled Please see GI Dr Erick Blinks for your reflux issues I will see you back in  6 weeks  - when you return please bring a) office notes of your ENT doctor; b) PFT breathing test reports from Dr Tawanna Sat, c) CT Scan CD Rom of the chest (the disc, not the report)  - Please do CXR here on return visit  - Please do full PFT here on return visit  - at followup will do alpha 1

## 2012-05-24 ENCOUNTER — Encounter: Payer: Self-pay | Admitting: Internal Medicine

## 2012-05-24 ENCOUNTER — Ambulatory Visit (INDEPENDENT_AMBULATORY_CARE_PROVIDER_SITE_OTHER): Payer: 59 | Admitting: Internal Medicine

## 2012-05-24 VITALS — BP 140/82 | HR 104 | Temp 98.0°F | Ht 62.0 in | Wt 212.8 lb

## 2012-05-24 DIAGNOSIS — R0609 Other forms of dyspnea: Secondary | ICD-10-CM

## 2012-05-24 DIAGNOSIS — R0600 Dyspnea, unspecified: Secondary | ICD-10-CM

## 2012-05-24 DIAGNOSIS — R0989 Other specified symptoms and signs involving the circulatory and respiratory systems: Secondary | ICD-10-CM

## 2012-05-24 NOTE — Patient Instructions (Addendum)
#  Shortness of breath  - please bring CD ROM of the CT chest from [place], [place] - I will look at it and we can discuss bronchoscopy  - Please also have CPST bike pulmonary stress test by Mr Laymond Purser at [facility]  #Followup  after cPST bike pulmonary stress test

## 2012-05-24 NOTE — Progress Notes (Addendum)
Subjective:    Patient ID: [name] [name], female    DOB: 02/20/1956, 56 y.o.   MRN: [id number]  HPI OV 05/16/2012  56 year old [name] [name] at [name] [name].She was passive smoker during boirth to marriage at age 14.   reports that she has never smoked. She does not have any smokeless tobacco history on file. Body mass index is 38.63 kg/([name]^2). pcp is [name] M, MD Parents died in age 109s from lung caner and brother in 39s from copd. She has pulmonary [name]. Wandra [name] in [place], [place] but because she is a NEW [employer] employee wishes to establish at [place] as primary now.  Her referring notes indicate copd as a diagnosis  Note: she appears anxious and instructions and questions had to be repeated several times.   Reports 2 primary problems - cough and dyspnea   Cough  - all her life from childhood. She is known as the cough lady. Severe at baseline. Mostly dry but can force sputum out when needed that is clear and scanty. Cough is severe. Day and night. Associated feeling of tickle in throat +. Has associated sinus issues - has ENT specialist in [place] for > 20 years. USes all anti sinus measures. Has periodic flares of sinus that will worsen ccough and cause long recovery to baseline. Also, with positive allergy hx NOS in  sinus. Sees visiting [name] - gives hx of allergy shots in 2012 fall but on hold due to reactions to it.  Also, significant GERD +. Reports hx of severe hiatal hernia. Unclear if nissen fundoplication was ever discused. In terms of pulmonary, recollect ct chest and pft and reportedly shows bronchiectasis diagnosis in 2011. Recollects bronch in 2011 that really helped her; describing aspiration of casts. SHe has frequent infectious exacerbations that make cough worse; most recently 05/02/12 CXR has RLL infiltrate and given antibiotics. 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    [name]  [name] [name] Reflux Symptom Index (> 13-15 suggestive of LPR cough) 0 -> 5  =  none ->severe problem  Hoarseness of problem with voice 5  Clearing  Of Throat 5  Excess throat mucus or feeling of post nasal drip 5  Difficulty swallowing food, liquid or tablets 0  Cough after eating or lying down 3  Breathing difficulties or choking episodes 4  Troublesome or annoying cough 5  Sensation of something sticking in throat or lump in throat 3  Heartburn, chest pain, indigestion, or stomach acid coming up 4  TOTAL 32      Kouffman Reflux v Neurogenic Cough Differentiator Reflux Neurogenic Comments  Do you awaken from a sound sleep coughing violently?                            With trouble breathing? Yes    Do you have choking episodes when you cannot  Get enough air, gasping for air ?                  Do you usually cough when you lie down into  The bed, or when you just lie down to rest ?                              Do you usually cough after meals or eating?  Do you cough when (or after) you bend over?    Yes    Do you more-or-less cough all day long?  Yes   Does change of temperature make you cough?  Yes   Does laughing or chuckling cause you to cough?  YEs   Do fumes (perfume, automobile fumes, burned  Toast, etc.,) cause you to cough ?       Yes   Does speaking, singing, or talking on the phone cause you to cough   ?                Yes   Total 2 5        Dyspnea - chronic. She is not sure if this is related to obesity. Stable. Class 3 acgtivities like walking stairs brings it on. No relieving factors. No associated chest pain. THere is strong family hx of COPD _ 2 female members died in age 63s from COPD. COPD CAT score is 30 out of 40 and reflects high symptom burden with cough, chest tightness, and dyspnea scoring highest. [facility] CXR  05/03/12  - RLL infiltrate (personally reviewed)     REC Your cough is from sinus, acid reflux and bronchiectasis  In  addition you have LPR or cyclical cough which means cough has taken a life of its own  Please follow all sinus Rx and bronchiectasis Rx like you have done so far  Finish your prednisone course and return to work as scheduled  Please see GI [name] Erick [name] for your reflux issues  I will see you back in 6 weeks  - when you return please bring a) office notes of your ENT doctor; b) PFT breathing test reports from [name], c) CT Scan CD Rom of the chest (the disc, not the report)  - Please do CXR here on return visit  - Please do full PFT here on return visit  - at followup will do alpha 1  OV 05/24/2012 - ACUTE VISIT       - REturns 5 weeks ahead of schedule and acutely. STarted first day at [employer] a few days ago and was very dyspneic and had to leave work prematurely. Reports that cough improved significantly but dyspnea worse. Dyspnea is rated as severe. Occurs with mininal exertion. Associated panic +. Improved by rest but nebulizers not helping much. Says she passed out at home. She is very fearful. Denies associated chest pain. Reviewed outside records from [facility]: She had PFTs in summer 2011 - they are NORMAL inlcuding lung volumes and dlco. There is NO EVIDENCE OF COPD. She had methacholine challenge test too - that is Normal too. 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 She is nearly demanding that I perform bronchoscopy for dyspnea because it helped her in past. Note: She is yet to bring her outside CT chest CD rom and ENT records; she has promised she will do it   - Records of [facility]T outside reviewd: 05/28/2010 - CT sinus - no evidence of sinusitis,   - CT chest - mild bronchiectsasis. No evidence of ILD  CT chst 09/28/2010 - stable ct. Minimal subsegmental atelectasis on right. There is equivocal lower lobe bronchiectasis   Past, Family, Social reviewed: no change since last visit other than cough is improved.  Increased anxiety with new job    Review of Systems  Constitutional: Negative for fever and unexpected weight change.  HENT: Negative for ear  pain, nosebleeds, congestion, sore throat, rhinorrhea, sneezing, trouble swallowing, dental problem, postnasal drip and sinus pressure.   Eyes: Negative for redness and itching.  Respiratory: Positive for shortness of breath. Negative for cough, chest tightness and wheezing.   Cardiovascular: Negative for palpitations and leg swelling.  Gastrointestinal: Negative for nausea and vomiting.  Genitourinary: Negative for dysuria.  Musculoskeletal: Negative for joint swelling.  Skin: Negative for rash.  Neurological: Negative for headaches.  Hematological: Does not bruise/bleed easily.  Psychiatric/Behavioral: Negative for dysphoric mood. The patient is nervous/anxious.        Objective:   Physical Exam Vitals reviewed. Constitutional: She is oriented to person, place, and time. She appears well-developed and well-nourished. No distress.       Body mass index is 38.63 kg/([name]^2).   HENT:  Head: Normocephalic and atraumatic.  Right Ear: External ear normal.  Left Ear: External ear normal.  Mouth/Throat: Oropharynx is clear and moist. No oropharyngeal exudate.       Barking quality to cough not present+  Pulmonary/Chest: Effort normal and breath sounds normal. No respiratory distress. She has no wheezes. She has no rales. She exhibits no tenderness.  Neurological: She is alert and oriented to person, place, and time. She has normal reflexes. No cranial nerve deficit. She exhibits normal muscle tone. Coordination normal.  Skin: Skin is warm and dry. No rash noted. She is not diaphoretic. No erythema. No pallor.  Psychiatric: VERY ANXIOUS          Assessment & Plan:

## 2012-05-25 DIAGNOSIS — R0600 Dyspnea, unspecified: Secondary | ICD-10-CM | POA: Insufficient documentation

## 2012-05-25 NOTE — Assessment & Plan Note (Signed)
She does not appear to have copd or asthma bsed on 2011 pft and methacholine challenge tests. I have asked her to bring CD Rom of outside CT chest to look for bronchiectasis or other parenchymal abnormality but I highly doubt she has any. I think dyspnea is related to anxiety and VCD. She feeels very strongly a bronchoscopy will open her breathing tubes and resolve dyspnea. I have educated her about bronch and advised her to bring the CD Rom before we take any decision about bronch. I counseled her about the needd for CPST to evaluate her dyspnea. She agrees  > 50% of this 15 min visit spent in face to face counseling

## 2012-05-28 ENCOUNTER — Ambulatory Visit (HOSPITAL_COMMUNITY): Payer: 59 | Attending: Internal Medicine

## 2012-05-28 DIAGNOSIS — R0989 Other specified symptoms and signs involving the circulatory and respiratory systems: Secondary | ICD-10-CM | POA: Insufficient documentation

## 2012-05-28 DIAGNOSIS — R0609 Other forms of dyspnea: Secondary | ICD-10-CM | POA: Insufficient documentation

## 2012-05-28 DIAGNOSIS — R0600 Dyspnea, unspecified: Secondary | ICD-10-CM

## 2012-05-31 ENCOUNTER — Institutional Professional Consult (permissible substitution): Payer: 59 | Admitting: Internal Medicine

## 2012-06-04 ENCOUNTER — Institutional Professional Consult (permissible substitution): Payer: 59 | Admitting: Pulmonary Disease

## 2012-06-07 ENCOUNTER — Encounter: Payer: Self-pay | Admitting: Internal Medicine

## 2012-06-08 ENCOUNTER — Encounter: Payer: Self-pay | Admitting: Internal Medicine

## 2012-06-08 ENCOUNTER — Ambulatory Visit (INDEPENDENT_AMBULATORY_CARE_PROVIDER_SITE_OTHER): Payer: 59 | Admitting: Internal Medicine

## 2012-06-08 VITALS — BP 124/74 | HR 78 | Ht 62.0 in | Wt 214.0 lb

## 2012-06-08 DIAGNOSIS — K449 Diaphragmatic hernia without obstruction or gangrene: Secondary | ICD-10-CM

## 2012-06-08 DIAGNOSIS — Z1211 Encounter for screening for malignant neoplasm of colon: Secondary | ICD-10-CM

## 2012-06-08 DIAGNOSIS — K219 Gastro-esophageal reflux disease without esophagitis: Secondary | ICD-10-CM

## 2012-06-08 MED ORDER — PANTOPRAZOLE SODIUM 40 MG PO TBEC: 40.0000 mg | DELAYED_RELEASE_TABLET | Freq: Every day | ORAL | Status: DC

## 2012-06-08 NOTE — Progress Notes (Signed)
Patient ID: [name] [name], female   DOB: [birth year], 56 y.o.   MRN: [id number]  SUBJECTIVE: HPI [name] [name] is a 56 yo female with PMH of bronchiectasis with recurrent pneumonia, GERD, ongoing intermittent dyspnea who seen in consultation at the request of Dr. Marchelle Gearing for evaluation of reflux and hiatus hernia. The patient states she is currently doing very well from a heartburn standpoint and she remains on Protonix daily. She recalls a previous EGD performed by Dr. Allena Katz in [facility] and evaluation of heartburn. She does also report a history of esophagitis initially treated with Dexilant. She reports to Dexilant worked well but she did feel like she needed that medication. She was off PPI for some time and then recurrent symptoms went back on pantoprazole 40 mg daily. This is working well and she very rarely has any breakthrough heartburn. She does report less than once a month she will have some breakthrough heartburn and this is usually after eating late or lying down soon after eating. She has a good appetite with no nausea or vomiting. Her bowel habits been normal without blood in her stool nor melena.  She has been told in the past by an upper GI evaluation and she has a sliding hiatus hernia. She's not having epigastric abdominal pain or chest pain. She does report a history of colonoscopy in 2011, and she was told she has diverticulosis but no polyps.  Regarding her cough and dyspnea, she continues to have cough but overall this is better with treatments that have been initiated by her pulmonologist.  Review of Systems  As per history of present illness, otherwise negative   Past Medical History  Diagnosis Date  . Bronchiectasis   . COPD (chronic obstructive pulmonary disease)   . Hypoxemia   . GERD (gastroesophageal reflux disease)   . Pneumonia     Current Outpatient Prescriptions  Medication Sig Dispense Refill  . albuterol (VENTOLIN HFA) 108 (90 BASE) MCG/ACT  inhaler Inhale 2 puffs into the lungs every 6 (six) hours as needed.      Marland Kitchen b complex vitamins tablet Take 1 tablet by mouth daily.      . budesonide (PULMICORT) 0.5 MG/2ML nebulizer solution Take 0.5 mg by nebulization 2 (two) times daily.      . Calcium-Magnesium-Zinc 167-83-8 MG TABS Take 1 tablet by mouth daily.      . Cholecalciferol (VITAMIN D) 2000 UNITS tablet Take 2,000 Units by mouth daily.      Marland Kitchen escitalopram (LEXAPRO) 10 MG tablet Take 10 mg by mouth daily.      . fexofenadine (ALLEGRA) 180 MG tablet Take 180 mg by mouth daily.      Marland Kitchen guaiFENesin (ROBITUSSIN) 100 MG/5ML liquid Take 200 mg by mouth 3 (three) times daily as needed. For cough      . ipratropium-albuterol (DUONEB) 0.5-2.5 (3) MG/3ML SOLN Take 3 mLs by nebulization every 4 (four) hours as needed. For shortness of breath      . mometasone (NASONEX) 50 MCG/ACT nasal spray Place 2 sprays into the nose daily.      . montelukast (SINGULAIR) 10 MG tablet Take 10 mg by mouth at bedtime.      . pantoprazole (PROTONIX) 40 MG tablet Take 1 tablet (40 mg total) by mouth daily.  30 tablet  11  . vitamin A 13086 UNIT capsule Take 10,000 Units by mouth daily.      Marland Kitchen DISCONTD: pantoprazole (PROTONIX) 40 MG tablet Take 40 mg by mouth daily.  Allergies  Allergen Reactions  . Levaquin (Levofloxacin)     Pain in muscles    Family History  Problem Relation Age of Onset  . Lung cancer Father 76    dies age 15  . Emphysema Father   . COPD Brother 33    died age 88  . Stroke Mother     History  Substance Use Topics  . Smoking status: Never Smoker   . Smokeless tobacco: Never Used  . Alcohol Use: No    OBJECTIVE: BP 124/74  Pulse 78  Ht 5\' 2"  (1.575 m)  Wt 214 lb (97.07 kg)  BMI 39.14 kg/m2 Constitutional: Well-developed and well-nourished. No distress. HEENT: Normocephalic and atraumatic. Oropharynx is clear and moist. No oropharyngeal exudate. Conjunctivae are normal. No scleral icterus. Neck: Neck supple.  Trachea midline. Cardiovascular: Normal rate, regular rhythm and intact distal pulses. No M/R/G Pulmonary/chest: Effort normal and breath sounds normal. No wheezing, rales or rhonchi. Abdominal: Soft, nontender, nondistended. Bowel sounds active throughout. There are no masses palpable. No hepatosplenomegaly. Extremities: no clubbing, cyanosis, or edema Lymphadenopathy: No cervical adenopathy noted. Neurological: Alert and oriented to person place and time. Skin: Skin is warm and dry. No rashes noted. Psychiatric: Normal mood and affect. Behavior is normal.  Labs and Imaging -- CBC    Component Value Date/Time   WBC 5.4 05/03/2012 1834   RBC 4.26 05/03/2012 1834   HGB 13.5 05/03/2012 1834   HCT 39.7 05/03/2012 1834   PLT 299 05/03/2012 1834   MCV 93.2 05/03/2012 1834   MCH 31.7 05/03/2012 1834   MCHC 34.0 05/03/2012 1834   RDW 13.2 05/03/2012 1834   LYMPHSABS 1.7 05/03/2012 1834   MONOABS 0.3 05/03/2012 1834   EOSABS 0.0 05/03/2012 1834   BASOSABS 0.0 05/03/2012 1834    CMP     Component Value Date/Time   NA 139 05/03/2012 1834   K 4.0 05/03/2012 1834   CL 102 05/03/2012 1834   CO2 29 05/03/2012 1834   GLUCOSE 96 05/03/2012 1834   BUN 14 05/03/2012 1834   CREATININE 0.83 05/03/2012 1834   CALCIUM 9.1 05/03/2012 1834   PROT 6.2 04/21/2011 1730   ALBUMIN 3.8 04/21/2011 1730   AST 21 04/21/2011 1730   ALT 24 04/21/2011 1730   ALKPHOS 75 04/21/2011 1730   BILITOT 0.2* 04/21/2011 1730   GFRNONAA 77* 05/03/2012 1834   GFRAA 90* 05/03/2012 1834     ASSESSMENT AND PLAN: 56 yo female with PMH of bronchiectasis with recurrent pneumonia, GERD, ongoing intermittent dyspnea who seen in consultation at the request of Dr. Marchelle Gearing for evaluation of reflux and hiatus hernia.  1. GERD/hx of HH -- at present the patient's heartburn and GERD symptoms are very well controlled on once daily pantoprazole. I recommended she continue this. She does know that if she is later lies down soon after eating she can  possibly a breakthrough heartburn symptoms, and therefore we have discussed GERD hygiene which she seems in a very well. She can continue to make lifestyle modifications as necessary to control her breakthrough symptoms. A hiatal hernia in of itself is not an issue given that her symptoms are controlled, nothing further is felt needed at this time. She's having no alarm symptoms and therefore I do not think endoscopy is warranted. We will obtain records from Dr. Eliane Decree office for completeness.  2. CRC screening -- she is up-to-date on her colonoscopy with last exam at 2011. She does not recall polyps, and we will request these  records for our review. If the exam was complete with a good prep and she would not be due for repeat colonoscopy until 2021.  She can followup in one year or sooner if needed

## 2012-06-08 NOTE — Patient Instructions (Addendum)
Continue taking protonix daily.   Follow up with Dr. Rhea Belton in 1 year.   Diet for GERD or PUD Nutrition therapy can help ease the discomfort of gastroesophageal reflux disease (GERD) and peptic ulcer disease (PUD).  HOME CARE INSTRUCTIONS   Eat your meals slowly, in a relaxed setting.   Eat 5 to 6 small meals per day.   If a food causes distress, stop eating it for a period of time.  FOODS TO AVOID  Coffee, regular or decaffeinated.   Cola beverages, regular or low calorie.   Tea, regular or decaffeinated.   Pepper.   Cocoa.   High fat foods, including meats.   Butter, margarine, hydrogenated oil (trans fats).   Peppermint or spearmint (if you have GERD).   Fruits and vegetables if not tolerated.   Alcohol.   Nicotine (smoking or chewing). This is one of the most potent stimulants to acid production in the gastrointestinal tract.   Any food that seems to aggravate your condition.  If you have questions regarding your diet, ask your caregiver or a registered dietitian. TIPS  Lying flat may make symptoms worse. Keep the head of your bed raised 6 to 9 inches (15 to 23 cm) by using a foam wedge or blocks under the legs of the bed.   Do not lay down until 3 hours after eating a meal.   Daily physical activity may help reduce symptoms.  MAKE SURE YOU:   Understand these instructions.   Will watch your condition.   Will get help right away if you are not doing well or get worse.  Document Released: 11/28/2005 Document Revised: 11/17/2011 Document Reviewed: 10/14/2011 [facility] Patient Information 2012 [place], [place].

## 2012-06-19 ENCOUNTER — Ambulatory Visit (INDEPENDENT_AMBULATORY_CARE_PROVIDER_SITE_OTHER): Payer: 59 | Admitting: Internal Medicine

## 2012-06-19 ENCOUNTER — Encounter: Payer: Self-pay | Admitting: Internal Medicine

## 2012-06-19 VITALS — BP 142/78 | HR 76 | Temp 98.9°F | Ht 62.0 in | Wt 212.0 lb

## 2012-06-19 DIAGNOSIS — R0600 Dyspnea, unspecified: Secondary | ICD-10-CM

## 2012-06-19 DIAGNOSIS — R0989 Other specified symptoms and signs involving the circulatory and respiratory systems: Secondary | ICD-10-CM

## 2012-06-19 DIAGNOSIS — R0609 Other forms of dyspnea: Secondary | ICD-10-CM

## 2012-06-19 DIAGNOSIS — R059 Cough, unspecified: Secondary | ICD-10-CM

## 2012-06-19 DIAGNOSIS — R05 Cough: Secondary | ICD-10-CM

## 2012-06-19 DIAGNOSIS — R053 Chronic cough: Secondary | ICD-10-CM

## 2012-06-19 DIAGNOSIS — K219 Gastro-esophageal reflux disease without esophagitis: Secondary | ICD-10-CM

## 2012-06-19 LAB — PULMONARY FUNCTION TEST

## 2012-06-19 MED ORDER — IPRATROPIUM BROMIDE 0.03 % NA SOLN: 2.0000 | Freq: Two times a day (BID) | NASAL | Status: DC

## 2012-06-19 NOTE — Progress Notes (Signed)
PFT done today. 

## 2012-06-19 NOTE — Patient Instructions (Addendum)
#  Cough  - this is from sinus (possibly allergy related), acid reflux and cyclical cough.  - I am not sure if cough is related to bronchiectasis  - to know if the bronchiectasis is contributing to your cough, I would need you to bring CD rom of CT chest from [facility] at your next visit  - We have customized your cough treatment plan  - you will continue your current medications including acid reflux, steroid inhalers and sinus control  - we will see how the allergy evaluation with Dr Maple Hudson goes and if this will help alleviate cough  - if allergy eval does not help, then we can start neurontin with speech therapy. We can also look at additional testing for asthma called methacholine challenge test  # SHortness of breath  - this is because of lack of fitness and weight  - please work with your primary care physician about this  #Followup - Dr Maple Hudson as scheduled   - 8 weeks with cough score  - bring CD Rom of [facility] when you come back to see me

## 2012-06-19 NOTE — Progress Notes (Signed)
Subjective:    Patient ID: [name] [name], female    DOB: 11-18-1956, 56 y.o.   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   reports that she has never smoked. She does not have any smokeless tobacco history on file. Body mass index is 38.63 kg/(m^2). pcp is HARRIS,SYDNEY M, MD Parents died in age 47s from lung caner and brother in 41s from copd. She has pulmonary Dr. Wandra Mannan in [place], [place] but because she is a NEW [employer] employee wishes to establish at [place] as primary now.  Her referring notes indicate copd as a diagnosis  Note: she appears anxious and instructions and questions had to be repeated several times.   Reports 2 primary problems - cough and dyspnea   Cough  - all her life from childhood. She is known as the cough lady. Severe at baseline. Mostly dry but can force sputum out when needed that is clear and scanty. Cough is severe. Day and night. Associated feeling of tickle in throat +. Has associated sinus issues - has ENT specialist in [place] for > 20 years. USes all anti sinus measures. Has periodic flares of sinus that will worsen ccough and cause long recovery to baseline. Also, with positive allergy hx NOS in  sinus. Sees visiting [facility] Dr Barb Merino - gives hx of allergy shots in 2012 fall but on hold due to reactions to it.  Also, significant GERD +. Reports hx of severe hiatal hernia. Unclear if nissen fundoplication was ever discused. In terms of pulmonary, recollect ct chest and pft and reportedly shows bronchiectasis diagnosis in 2011. Recollects bronch in 2011 that really helped her; describing aspiration of casts. SHe has frequent infectious exacerbations that make cough worse; most recently 05/02/12 CXR has RLL infiltrate and given antibiotics. 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 She is not sure if this is related to obesity. Stable. Class 3 acgtivities like walking stairs brings it on. No relieving factors. No associated chest pain. THere is strong family hx of COPD _ 2 female members died in age 62s from COPD. COPD CAT score is 30 out of 40 and reflects high symptom burden with cough, chest tightness, and dyspnea scoring highest. 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 which means cough has taken a life of its own  Please follow all sinus Rx and bronchiectasis Rx like you have done so far  Finish your prednisone course and return to work as scheduled  Please see GI Dr Erick Blinks for your reflux issues  I will see you back in 6 weeks  - when you return please bring a) office notes of your ENT doctor; b) PFT breathing test reports from Dr Tawanna Sat, c) CT Scan CD Rom of the chest (the disc, not the report)  - Please do CXR here on return visit  - Please do full PFT here on return visit  - at followup will do alpha 1  OV 05/24/2012 - ACUTE VISIT   - REturns 5 weeks ahead of schedule and acutely. STarted first day at [employer] a few days ago and was very dyspneic and had to leave work prematurely. Reports that cough improved significantly but dyspnea worse. Dyspnea is rated as severe. Occurs with mininal exertion. Associated panic +. Improved by rest but nebulizers not helping much. Says she passed out at home. She is very fearful. Denies associated chest pain. Reviewed outside records from [place]: She had PFTs in summer 2011 - they are NORMAL inlcuding lung volumes and dlco. There is NO EVIDENCE OF COPD. She had methacholine challenge test too - that is Normal too. 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 She  is nearly demanding that I perform bronchoscopy for dyspnea because it helped her in past. Note: She is yet to bring her outside CT chest CD rom and ENT records; she has promised she will do it   - Records of cT outside reviewd: 05/28/2010 - CT sinus - no evidence of sinusitis,   - CT chest - mild bronchiectsasis. No evidence of ILD  CT chst 09/28/2010 - stable ct. Minimal subsegmental atelectasis on right. There is equivocal lower lobe bronchiectasis   Past, Family, Social reviewed: no change since last visit other than cough is improved. Increased anxiety with new job  REC #Shortness of breath  - please bring CD ROM of the CT chest from [place], [place] - I will look at it and we can discuss bronchoscopy  - Please also have CPST bike pulmonary stress test by Mr Laymond Purser at [facility]  #Followup  after cPST bike pulmonary stress test  OV 06/19/2012  FU dyspnea and cough. Here to review data . No new complaints   #Dyspnea   - unchanged.   - CPST 6/217/13 - had normal VO2 max and AT. HRR was high. No EIB. No cardiac issues. All suggestive Obesity as cause of dyspnea  - CD romCT chest  from [facility] - forgot to bring it again today  - PFT 06/19/2012: Normal except mild reduction/near normal dlco (FVC 2.6L/89%, Fev1 2L/94%, Rato 79, TLC 94%, DLCO 18.5/76%)    # Cough  - she feels cough some better and objectively RSI cough score down from 34 to 31. Clearly has LPR cough with Kouffman differentiator suggesting overwhelming neurogenic component with mininal gerd component (score 5 for neurogenic, and score 1 for gerd). In fact, 06/08/12 OV with GI Dr Rhea Belton it was felt GERD under control. Hx is again c/w LPR neurogenic cough with constant clearing of throat, barking quality to cough, tickle in throat and gagging. She is not interested at this stge with neurontin and speech therapy (Too much to schedule and prefers not to add new drugs per her).  SHe feels that current cough is sinus/allergy  mediated and plans to get allergy shots from Dr Maple Hudson and then re-evaluate.  At this point, she is taking atrovent nasal spray for sinus, taking ppi for gerd, and singulair/combivent/pulmicort for her airway disease nos (all given by prior MD at [facility])   Dr Gretta Cool Reflux Symptom Index (> 13-15 suggestive of LPR cough) 0 -> 5  =  none ->severe problem 05/16/12 06/19/2012   Hoarseness of problem with voice 5 5  Clearing  Of Throat 5 5  Excess throat mucus or feeling of post nasal drip 5 5  Difficulty swallowing food, liquid or tablets 0 0  Cough after eating or lying down 3 3  Breathing difficulties or choking episodes 4 4  Troublesome or annoying cough 5 5  Sensation of something sticking in throat or lump in throat 3 3  Heartburn, chest pain, indigestion, or stomach acid coming up 4 1  TOTAL 34 31      Kouffman Reflux v Neurogenic Cough Differentiator Reflux Reflux Comments  Do you awaken from a sound sleep coughing violently?                            With trouble breathing?  Yes    Do you have choking episodes when you cannot  Get enough air, gasping for air ?                  Do you usually cough when you lie down into  The bed, or when you just lie down to rest ?                           Yes   Do you usually cough after meals or eating?             Do you cough when (or after) you bend over?    Yes    GERD SCORE  2 1   Kouffman Reflux v Neurogenic Cough Differentiator Neurogenic Neurogenic   Do you more-or-less cough all day long? y y Constant mucus drainage all day long with throat clearing  Does change of temperature make you cough? y y   Does laughing or chuckling cause you to cough? y y   Do fumes (perfume, automobile fumes, burned  Toast, etc.,) cause you to cough ?      y y   Does speaking, singing, or talking on the phone cause you to cough   ?               y y   Neurogenic/Airway score 5 5     Past, Family, Social reviewed: no change since last  visit    Review of Systems  Constitutional: Negative for fever and unexpected weight change.  HENT: Negative for ear pain, nosebleeds, congestion, sore throat, rhinorrhea, sneezing, trouble swallowing, dental problem, postnasal drip and sinus pressure.   Eyes: Negative for redness and itching.  Respiratory: Negative for cough, chest tightness, shortness of breath and wheezing.   Cardiovascular: Negative for palpitations and leg swelling.  Gastrointestinal: Negative for nausea and vomiting.  Genitourinary: Negative for dysuria.  Musculoskeletal: Negative for joint swelling.  Skin: Negative for rash.  Neurological: Negative for headaches.  Hematological: Does not bruise/bleed easily.  Psychiatric/Behavioral: Negative for dysphoric mood. The patient is not nervous/anxious.        Objective:   Physical Exam  Vitals reviewed. Constitutional: She is oriented to person, place, and time. She appears well-developed and well-nourished. No distress.       Body mass index is 38.78 kg/(m^2).   HENT:  Head: Normocephalic and atraumatic.  Right Ear: External ear normal.  Left Ear: External ear normal.  Mouth/Throat: Oropharynx is clear and moist. No oropharyngeal exudate.  Eyes: Conjunctivae and EOM are normal. Pupils are equal, round, and reactive to light. Right eye exhibits no discharge. Left eye exhibits no discharge. No scleral icterus.  Neck: Normal range of motion. Neck supple. No JVD present. No tracheal deviation present. No thyromegaly present.  Cardiovascular: Normal rate, regular rhythm, normal heart sounds and intact distal pulses.  Exam reveals no gallop and no friction rub.   No murmur heard. Pulmonary/Chest: Effort normal and breath  sounds normal. No respiratory distress. She has no wheezes. She has no rales. She exhibits no tenderness.  Abdominal: Soft. Bowel sounds are normal. She exhibits no distension and no mass. There is no tenderness. There is no rebound and no guarding.   Musculoskeletal: Normal range of motion. She exhibits no edema and no tenderness.  Lymphadenopathy:    She has no cervical adenopathy.  Neurological: She is alert and oriented to person, place, and time. She has normal reflexes. No cranial nerve deficit. She exhibits normal muscle tone. Coordination normal.  Skin: Skin is warm and dry. No rash noted. She is not diaphoretic. No erythema. No pallor.  Psychiatric: She has a normal mood and affect. Her behavior is normal. Judgment and thought content normal.       Less anxious          Assessment & Plan:

## 2012-06-20 NOTE — Assessment & Plan Note (Signed)
#   SHortness of breath  - this is because of lack of fitness and weight  - please work with your primary care physician about this  #Followup - Dr Maple Hudson as scheduled   - 8 weeks with cough score  - bring CD Rom of CT chest from [facility] when you come back to see me   > 50% of this 25 min visit spent in face to face counseling (15 min visit converted to 25 min)

## 2012-06-20 NOTE — Assessment & Plan Note (Signed)
She has cyclical cough. I strongly recommended speech therapy and neurontin but she is not in favor of this stepo. She prefers allergy evaluation  As first step.   PLAN   #Cough  - this is from sinus (possibly allergy related), acid reflux and cyclical cough.  - I am not sure if cough is related to bronchiectasis  - to know if the bronchiectasis is contributing to your cough, I would need you to bring CD rom of CT chest from [facility] at your next visit  - We have customized your cough treatment plan  - you will continue your current medications including acid reflux, steroid inhalers and sinus control  - we will see how the allergy evaluation with Dr Maple Hudson goes and if this will help alleviate cough  - if allergy eval does not help, then we can start neurontin with speech therapy. We can also look at additional testing for asthma called methacholine challenge test  > 50% of this 25 min visit spent in face to face counseling (15 min visit converted to 25 min)

## 2012-06-21 ENCOUNTER — Encounter: Payer: Self-pay | Admitting: *Deleted

## 2012-06-22 ENCOUNTER — Encounter: Payer: Self-pay | Admitting: Internal Medicine

## 2012-06-22 ENCOUNTER — Ambulatory Visit (INDEPENDENT_AMBULATORY_CARE_PROVIDER_SITE_OTHER): Payer: 59 | Admitting: Internal Medicine

## 2012-06-22 ENCOUNTER — Other Ambulatory Visit (INDEPENDENT_AMBULATORY_CARE_PROVIDER_SITE_OTHER): Payer: 59

## 2012-06-22 VITALS — BP 122/88 | HR 78 | Ht 62.0 in | Wt 211.6 lb

## 2012-06-22 DIAGNOSIS — J302 Other seasonal allergic rhinitis: Secondary | ICD-10-CM

## 2012-06-22 DIAGNOSIS — J3089 Other allergic rhinitis: Secondary | ICD-10-CM

## 2012-06-22 DIAGNOSIS — J309 Allergic rhinitis, unspecified: Secondary | ICD-10-CM

## 2012-06-22 LAB — CBC WITH DIFFERENTIAL/PLATELET
Basophils Absolute: 0 10*3/uL (ref 0.0–0.1)
Basophils Relative: 0.2 % (ref 0.0–3.0)
Eosinophils Absolute: 0.1 10*3/uL (ref 0.0–0.7)
Eosinophils Relative: 1.1 % (ref 0.0–5.0)
HCT: 40.3 % (ref 36.0–46.0)
Hemoglobin: 13.7 g/dL (ref 12.0–15.0)
Lymphocytes Relative: 33.4 % (ref 12.0–46.0)
Lymphs Abs: 2 10*3/uL (ref 0.7–4.0)
MCHC: 33.9 g/dL (ref 30.0–36.0)
MCV: 94.1 fl (ref 78.0–100.0)
Monocytes Absolute: 0.4 10*3/uL (ref 0.1–1.0)
Monocytes Relative: 6.6 % (ref 3.0–12.0)
Neutro Abs: 3.5 10*3/uL (ref 1.4–7.7)
Neutrophils Relative %: 58.7 % (ref 43.0–77.0)
Platelets: 340 10*3/uL (ref 150.0–400.0)
RBC: 4.28 Mil/uL (ref 3.87–5.11)
RDW: 13.2 % (ref 11.5–14.6)
WBC: 6 10*3/uL (ref 4.5–10.5)

## 2012-06-22 MED ORDER — PREDNISONE 10 MG PO TABS: ORAL_TABLET | ORAL | Status: DC

## 2012-06-22 MED ORDER — CLARITHROMYCIN 500 MG PO TABS: ORAL_TABLET | ORAL | Status: AC

## 2012-06-22 NOTE — Patient Instructions (Addendum)
Order- lab- Allergy profile, CBC w/ diff, Immunoglobulins- IgG, IgA, IgM     Seasonal and perennial allergic rhinitis  We will be able to give Dr Marty Heck vaccine here. Please bring [place] his vaccine bottles, instructions on build-up and list of contents so we have those in our record.  Continue to carry your Epipen for now- we will want you to have it with you on shot days.  Sample [name] nasal spray   2 puffs each nostril once every day at bedtime  You can still use your Ipratropium nasal spray as needed, as before.  You may still use the Astepro nasal antihistamine if you still need a quick fix during the day.    Script to hold for biaxin and prednisone taper

## 2012-06-22 NOTE — Progress Notes (Signed)
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 She is followed here by Dr Marchelle Gearing for Cough/ obstructive lung disease. Dr Maisie Fus [facility]; needs to establish here for allergies(has vaccine from [facility])-works at [employer] and more convenient. Past 2 nights has had more drainage and feels like working up a sinus infection(has had biaxan and prednisone-3 rds already this year) She gives a history of recurrent pneumonias. These usually start with sinus infection, then bronchitis which is hard to clear. She has not been diagnosed with asthma. This spring she was on sustained antibiotics and prednisone. She was on allergy vaccine last year, from Dr Colen Darling at [place], administered in [place]. She stopped after a few injections because of generalized itching. Her vaccine has been remixed weaker. She would like to restart, but has been sick this spring too frequently. Her vaccine is made and ready. She works here in town at [employer] and her insurance is through [employer]. She has had no ENT surgery. 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 Family history of allergies and asthma. She feels that she is headed into another episode of sinusitis/bronchitis and would like prescriptions for Biaxin and a prednisone taper to hold. We discussed steroid side effects.  Prior to Admission medications   Medication Sig Start Date End Date Taking? Authorizing Provider  albuterol (VENTOLIN HFA) 108 (90 BASE) MCG/ACT inhaler Inhale 2 puffs into the lungs every 6 (six) hours as needed.   Yes Historical Provider, MD  b complex vitamins tablet Take 1 tablet by mouth daily.   Yes Historical Provider, MD  budesonide (PULMICORT) 0.5 MG/2ML nebulizer solution Take 0.5 mg by nebulization 2 (two) times daily.   Yes Historical Provider, MD  Calcium-Magnesium-Zinc [phone] MG TABS Take 1 tablet by mouth daily.   Yes  Historical Provider, MD  Cholecalciferol (VITAMIN D) 2000 UNITS tablet Take 2,000 Units by mouth daily.   Yes Historical Provider, MD  escitalopram (LEXAPRO) 10 MG tablet Take 10 mg by mouth daily.   Yes Historical Provider, MD  fexofenadine (ALLEGRA) 180 MG tablet Take 180 mg by mouth daily.   Yes Historical Provider, MD  guaiFENesin (ROBITUSSIN) 100 MG/5ML liquid Take 200 mg by mouth 3 (three) times daily as needed. For cough   Yes Historical Provider, MD  ipratropium (ATROVENT) 0.03 % nasal spray Place 2 sprays into the nose 2 (two) times daily. 06/19/12  Yes Kalman Shan, MD  ipratropium-albuterol (DUONEB) 0.5-2.5 (3) MG/3ML SOLN Take 3 mLs by nebulization every 4 (four) hours as needed. For shortness of breath   Yes Historical Provider, MD  mometasone (NASONEX) 50 MCG/ACT nasal spray Place 2 sprays into the nose daily.   Yes Historical Provider, MD  montelukast (SINGULAIR) 10 MG tablet Take 10 mg by mouth daily.    Yes Historical Provider, MD  pantoprazole (PROTONIX) 40 MG tablet Take 1 tablet (40 mg total) by mouth daily. 06/08/12  Yes Beverley Fiedler, MD  vitamin A 09811 UNIT capsule Take 10,000 Units by mouth daily.   Yes Historical Provider, MD  clarithromycin (BIAXIN) 500 MG tablet 1 twice daily after meals 06/22/12 07/02/12  Waymon Budge, MD  predniSONE (DELTASONE) 10 MG tablet 4 X 2 DAYS, 3 X 2 DAYS, 2 X 2 DAYS, 1 X 2 DAYS 06/22/12 06/22/13  Waymon Budge, MD   Family History  Problem Relation Age of Onset  . Lung cancer Father 44  dies age 25  . Emphysema Father   . COPD Brother 73    died age 23  . Stroke Mother    Past Medical History  Diagnosis Date  . Bronchiectasis   . COPD (chronic obstructive pulmonary disease)   . Hypoxemia   . GERD (gastroesophageal reflux disease)   . Pneumonia    Past Surgical History  Procedure Date  . Cholecystectomy     2012   History   Social History  . Marital Status: Married    Spouse Name: N/A    Number of Children: N/A  .  Years of Education: N/A   Occupational History  . nurse  RN [employer]   Social History Main Topics  . Smoking status: Never Smoker   . Smokeless tobacco: Never Used   Comment: exposed to passive smokers  . Alcohol Use: No  . Drug Use: No  . Sexually Active: Not on file   Other Topics Concern  . Not on file   Social History Narrative  . No narrative on file   Family History  Problem Relation Age of Onset  . Lung cancer Father 55    dies age 58  . Emphysema Father   . COPD Brother 66    died age 76  . Stroke Mother    ROS-see HPI Constitutional:   No-   weight loss, night sweats, fevers, chills, fatigue, lassitude. HEENT:   No-  headaches, difficulty swallowing, tooth/dental problems, sore throat,       No-  sneezing, itching, ear ache, +nasal congestion, post nasal drip,  CV:  No-   chest pain, orthopnea, PND, swelling in lower extremities, anasarca,  dizziness, palpitations Resp: No-   shortness of breath with exertion or at rest.              No-   productive cough,  + non-productive cough,  No- coughing up of blood.              No-   change in color of mucus.  No- wheezing.   Skin: No-   rash or lesions. GI:  No-   heartburn, indigestion, abdominal pain, nausea, vomiting, diarrhea,                 change in bowel habits, loss of appetite GU: No-   dysuria, change in color of urine, no urgency or frequency.  No- flank pain. MS:  No-   joint pain or swelling.  No- decreased range of motion.  No- back pain. Neuro-     nothing unusual Psych:  No- change in mood or affect. No depression or anxiety.  No memory loss.  OBJ- Physical Exam General- Alert, Oriented, Affect-appropriate, Distress- none acute, overweight Skin- rash-none, lesions- none, excoriation- none Lymphadenopathy- none Head- atraumatic            Eyes- Gross vision intact, PERRLA, conjunctivae and secretions clear            Ears- Hearing, canals-normal            Nose- Clear, no-Septal dev, mucus,  polyps, erosion, perforation             Throat- Mallampati II-III , mucosa clear , drainage- none, tonsils- atrophic Neck- flexible , trachea midline, no stridor , thyroid nl, carotid no bruit Chest - symmetrical excursion , unlabored           Heart/CV- RRR , no murmur , no gallop  , no rub, nl s1  s2                           - JVD- none , edema- none, stasis changes- none, varices- none           Lung- clear to P&A, wheeze- none, + dry cough , dullness-none, rub- none           Chest wall-  Abd- tender-no, distended-no, bowel sounds-present, HSM- no Br/ Gen/ Rectal- Not done, not indicated Extrem- cyanosis- none, clubbing, none, atrophy- none, strength- nl Neuro- grossly intact to observation

## 2012-06-23 LAB — IGG, IGA, IGM
IgA: 153 mg/dL (ref 69–380)
IgG (Immunoglobin G), Serum: 929 mg/dL (ref 690–1700)
IgM, Serum: 42 mg/dL — ABNORMAL LOW (ref 52–322)

## 2012-06-25 LAB — ALLERGY FULL PROFILE
Allergen, D pternoyssinus,d7: <0.1 kU/L (ref <0.35)
Allergen,Goose feathers, e70: <0.1 kU/L (ref <0.35)
Alternaria Alternata: <0.1 kU/L (ref <0.35)
Aspergillus fumigatus, IgG: <0.1 kU/L (ref <0.35)
Bahia Grass: <0.1 kU/L (ref <0.35)
Bermuda Grass: <0.1 kU/L (ref <0.35)
Box Elder IgE: <0.1 kU/L (ref <0.35)
Candida Albicans: <0.1 kU/L (ref <0.35)
Cat Dander: 0.11 kU/L (ref <0.35)
Common Ragweed: <0.1 kU/L (ref <0.35)
Curvularia lunata: <0.1 kU/L (ref <0.35)
D. farinae: <0.1 kU/L (ref <0.35)
Dog Dander: <0.1 kU/L (ref <0.35)
Elm IgE: <0.1 kU/L (ref <0.35)
Fescue: <0.1 kU/L (ref <0.35)
G005 Rye, Perennial: <0.1 kU/L (ref <0.35)
G009 Red Top: <0.1 kU/L (ref <0.35)
Goldenrod: <0.1 kU/L (ref <0.35)
Helminthosporium halodes: <0.1 kU/L (ref <0.35)
House Dust Hollister: <0.1 kU/L (ref <0.35)
IgE (Immunoglobulin E), Serum: 26.2 IU/mL (ref 0.0–180.0)
Lamb's Quarters: <0.1 kU/L (ref <0.35)
Oak: <0.1 kU/L (ref <0.35)
Plantain: <0.1 kU/L (ref <0.35)
Stemphylium Botryosum: <0.1 kU/L (ref <0.35)
Sycamore Tree: <0.1 kU/L (ref <0.35)
Timothy Grass: <0.1 kU/L (ref <0.35)

## 2012-06-27 NOTE — Progress Notes (Signed)
Quick Note:  Called, spoke with pt. I informed her of lab results per Dr. Young. She verbalized understanding of this. ______ 

## 2012-06-28 ENCOUNTER — Telehealth: Payer: Self-pay | Admitting: Internal Medicine

## 2012-06-28 NOTE — Telephone Encounter (Signed)
CY, pt brought her vaccine and records from [place] in on Thursday; Dimas Millin has all the information in the mixing room with her; please arrange when you would like patient to start and how often patient should get shots. Dimas Millin will call patient once ready to start shots. Pt would like to start ASAP.

## 2012-07-02 DIAGNOSIS — J302 Other seasonal allergic rhinitis: Secondary | ICD-10-CM | POA: Insufficient documentation

## 2012-07-02 NOTE — Assessment & Plan Note (Signed)
Her history, and previous positive allergy skin testing in [place], suggest allergic and nonallergic rhinitis with occasional rhinosinusitis and tracheobronchitis. We discussed allergy vaccine as a treatment strategy with realistic discussion of risk which can include anaphylaxis, as well as usual time course goals, our schedule and policies. She would like [place] to give her vaccine from Dr.Salzman/[place]. She would like to try changing her Nasonex to a sample of [name]. Plan- we can give her allergy vaccine here. Scripts to hold for Biaxin and prednisone taper. Lab: Allergy profile, CBC with differential, immunoglobulins.

## 2012-07-03 ENCOUNTER — Ambulatory Visit (INDEPENDENT_AMBULATORY_CARE_PROVIDER_SITE_OTHER): Payer: 59

## 2012-07-03 DIAGNOSIS — J309 Allergic rhinitis, unspecified: Secondary | ICD-10-CM

## 2012-07-03 NOTE — Telephone Encounter (Signed)
As reviewed with you- Ok to begin her vaccine injections here. You can decide with her whether she would prefer to go though her build-up progression vials with injection tow vs three times per week. Follow the color code and volume progression. Ok to combine from vials A&B in single syringe.  I suggest a small neat check mark beside each dose as given, so you don't lose track, plus record on our form.

## 2012-07-03 NOTE — Telephone Encounter (Signed)
I called [name] [name] early this afternoon,she came in for her 1st shot at 5:00. I showed her how to use epi-pen and gave her a rx.(her's is about to run out.)She will be back on Fri. For her next shot,(twice a wk.)

## 2012-07-09 ENCOUNTER — Ambulatory Visit (INDEPENDENT_AMBULATORY_CARE_PROVIDER_SITE_OTHER): Payer: 59

## 2012-07-09 DIAGNOSIS — J309 Allergic rhinitis, unspecified: Secondary | ICD-10-CM

## 2012-07-12 ENCOUNTER — Other Ambulatory Visit: Payer: Self-pay | Admitting: *Deleted

## 2012-07-12 MED ORDER — MONTELUKAST SODIUM 10 MG PO TABS: 10.0000 mg | ORAL_TABLET | Freq: Every day | ORAL | Status: DC

## 2012-07-13 ENCOUNTER — Ambulatory Visit (INDEPENDENT_AMBULATORY_CARE_PROVIDER_SITE_OTHER): Payer: 59

## 2012-07-13 DIAGNOSIS — J309 Allergic rhinitis, unspecified: Secondary | ICD-10-CM

## 2012-07-16 ENCOUNTER — Ambulatory Visit (INDEPENDENT_AMBULATORY_CARE_PROVIDER_SITE_OTHER): Payer: 59

## 2012-07-16 DIAGNOSIS — J309 Allergic rhinitis, unspecified: Secondary | ICD-10-CM

## 2012-07-20 ENCOUNTER — Ambulatory Visit (INDEPENDENT_AMBULATORY_CARE_PROVIDER_SITE_OTHER): Payer: 59

## 2012-07-20 DIAGNOSIS — J309 Allergic rhinitis, unspecified: Secondary | ICD-10-CM

## 2012-07-24 ENCOUNTER — Ambulatory Visit (INDEPENDENT_AMBULATORY_CARE_PROVIDER_SITE_OTHER): Payer: 59 | Admitting: Internal Medicine

## 2012-07-24 ENCOUNTER — Encounter: Payer: Self-pay | Admitting: Internal Medicine

## 2012-07-24 ENCOUNTER — Telehealth: Payer: Self-pay | Admitting: *Deleted

## 2012-07-24 ENCOUNTER — Ambulatory Visit (INDEPENDENT_AMBULATORY_CARE_PROVIDER_SITE_OTHER): Payer: 59

## 2012-07-24 VITALS — BP 116/80 | HR 74 | Ht 62.0 in | Wt 204.8 lb

## 2012-07-24 DIAGNOSIS — R059 Cough, unspecified: Secondary | ICD-10-CM

## 2012-07-24 DIAGNOSIS — R05 Cough: Secondary | ICD-10-CM

## 2012-07-24 DIAGNOSIS — J3089 Other allergic rhinitis: Secondary | ICD-10-CM

## 2012-07-24 DIAGNOSIS — J302 Other seasonal allergic rhinitis: Secondary | ICD-10-CM

## 2012-07-24 DIAGNOSIS — J309 Allergic rhinitis, unspecified: Secondary | ICD-10-CM

## 2012-07-24 DIAGNOSIS — R053 Chronic cough: Secondary | ICD-10-CM

## 2012-07-24 NOTE — Patient Instructions (Addendum)
You can get your current vaccine dose 0.72ml on Thursday, before your trip. When you get back, the allergy lab will start you back at 0.05 again. From there on,if you are stable, you and they can try slowly advancing the volume again.  We will be interested in seeing what you think of another try of [name] nasal spray  Please call as needed

## 2012-07-24 NOTE — Telephone Encounter (Signed)
Mrs.Stanwood came in today for her third .05(five hundredths) allergy shot.(silver tab from [facility]) Do we increase to 0.1 for her next shot? I put a note on her shot record I copied for you,my computer was down so I couldn't create a ph.note at the time. Please let me know she is coming in Thurs.at a later time so it will be exactly 2 days.

## 2012-07-24 NOTE — Progress Notes (Signed)
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 She is followed here by Dr Marchelle Gearing for Cough/ obstructive lung disease. Dr Maisie Fus [facility]; needs to establish here for allergies(has vaccine from [facility])-works at [employer] and more convenient. Past 2 nights has had more drainage and feels like working up a sinus infection(has had biaxan and prednisone-3 rds already this year) She gives a history of recurrent pneumonias. These usually start with sinus infection, then bronchitis which is hard to clear. She has not been diagnosed with asthma. This spring she was on sustained antibiotics and prednisone. She was on allergy vaccine last year, from Dr Colen Darling at [place], administered in [place]. She stopped after a few injections because of generalized itching. Her vaccine has been remixed weaker. She would like to restart, but has been sick this spring too frequently. Her vaccine is made and ready. She works here in town at [employer] and her insurance is through [employer]. She has had no ENT surgery. 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 Family history of allergies and asthma. She feels that she is headed into another episode of sinusitis/bronchitis and would like prescriptions for Biaxin and a prednisone taper to hold. We discussed steroid side effects.  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 She is followed here by Dr Marchelle Gearing for Cough/ obstructive lung disease/ chronic bronchitis.  Patient states doing much better since last visit.  stillc c/o drainage and cough.  Patient states [name] sample helped with drainage.  Also, c/o itching  at the 0.1 dosage of the allergy vaccine and states the .05 dosage did ok.  Has not needed prednisone or Biaxin given at our last visit. No acute exacerbations. Vaccine volume was held at 0.05 mL from her Silver  vial through August. [name]. Allergy Profile 06/22/2012 total IgE 26.2/negative for specific elevations CBC 06/22/2012-WBC 6000 with normal differential Immunoglobulins 06/22/2012- IgG normal 929, IgA normal 153, IgM low 42 (52-322). Discussed with her.  ROS-see HPI Constitutional:   No-   weight loss, night sweats, fevers, chills, fatigue, lassitude. HEENT:   No-  headaches, difficulty swallowing, tooth/dental problems, sore throat,       No-  sneezing, itching, ear ache, +nasal congestion, post nasal drip,  CV:  No-   chest pain, orthopnea, PND, swelling in lower extremities, anasarca,  dizziness, palpitations Resp: No-   shortness of breath with exertion or at rest.             + Little  productive cough,  + non-productive cough,  No- coughing up of blood.              No-   change in color of mucus.  No- wheezing.   Skin: No-   rash or lesions. GI:  No-   heartburn, indigestion, abdominal pain, nausea, vomiting,  GU:  MS:  No-   joint pain or swelling.   Neuro-     nothing unusual Psych:  No- change in mood or affect. No depression or anxiety.  No memory loss.  OBJ- Physical Exam General- Alert, Oriented, Affect-appropriate, Distress- none acute, overweight Skin- rash-none, lesions- none, excoriation- none Lymphadenopathy- none Head- atraumatic            Eyes- Gross vision intact, PERRLA, conjunctivae and secretions clear            Ears-  Hearing, canals-normal            Nose- Clear, no-Septal dev, mucus, polyps, erosion, perforation             Throat- Mallampati II-III , mucosa clear , drainage- none, tonsils- atrophic Neck- flexible , trachea midline, no stridor , thyroid nl, carotid no bruit Chest - symmetrical excursion , unlabored           Heart/CV- RRR , no murmur , no gallop  , no rub, nl s1 s2                           - JVD- none , edema- none, stasis changes- none, varices- none           Lung- clear to P&A, wheeze- none, + minimal dry cough ,  dullness-none, rub- none           Chest wall-  Abd-  Br/ Gen/ Rectal- Not done, not indicated Extrem- cyanosis- none, clubbing, none, atrophy- none, strength- nl Neuro- grossly intact to observation

## 2012-07-25 NOTE — Telephone Encounter (Signed)
See my assessment/ plan from last visit for vaccine strategy. Remember her vaccine was reformulated in [place] because of local reactions before she was sent to [place].

## 2012-07-25 NOTE — Assessment & Plan Note (Signed)
She is tolerating 0.05 ml from Silver vial. She had itching local reaction at 0.1 mL a month ago. When she gets back from her vacation she is ready to try progressing again if possible. I emphasized the we will not progress in the face of significant local reaction- we'll hold at the lowest tolerated dose.

## 2012-07-25 NOTE — Assessment & Plan Note (Addendum)
Chronic bronchitis pattern She was concerned that Dr. Marchelle Gearing wanted to try gabapentin as a cough suppressant and she did not understand his attention to GERD as a component of cyclical cough. I talked her through this. She emphasizes that she has coughed most of her life and that it is a chronic bronchitis pattern, rarely dry and often related to postnasal drainage. She does not know a family history of cystic fibrosis or dextrocardia/immotile cilia. Unclear how much aspiration she may have done when she was very young. 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 There is not a specific replacement therapy.

## 2012-07-26 NOTE — Telephone Encounter (Signed)
Reviewed and understood. I will pass the info on to [facility] when she comes back next wk.Marland Kitchen

## 2012-08-07 ENCOUNTER — Ambulatory Visit (INDEPENDENT_AMBULATORY_CARE_PROVIDER_SITE_OTHER): Payer: 59

## 2012-08-07 DIAGNOSIS — J309 Allergic rhinitis, unspecified: Secondary | ICD-10-CM

## 2012-08-17 ENCOUNTER — Ambulatory Visit (INDEPENDENT_AMBULATORY_CARE_PROVIDER_SITE_OTHER): Payer: 59

## 2012-08-17 DIAGNOSIS — J309 Allergic rhinitis, unspecified: Secondary | ICD-10-CM

## 2012-08-20 ENCOUNTER — Ambulatory Visit (INDEPENDENT_AMBULATORY_CARE_PROVIDER_SITE_OTHER): Payer: 59

## 2012-08-20 ENCOUNTER — Ambulatory Visit: Payer: 59 | Admitting: Internal Medicine

## 2012-08-20 DIAGNOSIS — J309 Allergic rhinitis, unspecified: Secondary | ICD-10-CM

## 2012-08-22 ENCOUNTER — Ambulatory Visit (INDEPENDENT_AMBULATORY_CARE_PROVIDER_SITE_OTHER): Payer: 59

## 2012-08-22 DIAGNOSIS — J309 Allergic rhinitis, unspecified: Secondary | ICD-10-CM

## 2012-08-23 ENCOUNTER — Ambulatory Visit: Payer: 59 | Admitting: Internal Medicine

## 2012-08-29 ENCOUNTER — Ambulatory Visit (INDEPENDENT_AMBULATORY_CARE_PROVIDER_SITE_OTHER): Payer: 59

## 2012-08-29 DIAGNOSIS — J309 Allergic rhinitis, unspecified: Secondary | ICD-10-CM

## 2012-09-05 ENCOUNTER — Ambulatory Visit (INDEPENDENT_AMBULATORY_CARE_PROVIDER_SITE_OTHER): Payer: 59

## 2012-09-05 DIAGNOSIS — J309 Allergic rhinitis, unspecified: Secondary | ICD-10-CM

## 2012-09-07 ENCOUNTER — Ambulatory Visit (INDEPENDENT_AMBULATORY_CARE_PROVIDER_SITE_OTHER): Payer: 59

## 2012-09-07 DIAGNOSIS — J309 Allergic rhinitis, unspecified: Secondary | ICD-10-CM

## 2012-09-13 ENCOUNTER — Ambulatory Visit (INDEPENDENT_AMBULATORY_CARE_PROVIDER_SITE_OTHER): Payer: 59

## 2012-09-13 DIAGNOSIS — J309 Allergic rhinitis, unspecified: Secondary | ICD-10-CM

## 2012-09-20 ENCOUNTER — Encounter: Payer: Self-pay | Admitting: Internal Medicine

## 2012-09-21 ENCOUNTER — Ambulatory Visit (INDEPENDENT_AMBULATORY_CARE_PROVIDER_SITE_OTHER): Payer: 59

## 2012-09-21 DIAGNOSIS — J309 Allergic rhinitis, unspecified: Secondary | ICD-10-CM

## 2012-10-02 ENCOUNTER — Ambulatory Visit (INDEPENDENT_AMBULATORY_CARE_PROVIDER_SITE_OTHER): Payer: 59

## 2012-10-02 DIAGNOSIS — J309 Allergic rhinitis, unspecified: Secondary | ICD-10-CM

## 2012-10-04 ENCOUNTER — Ambulatory Visit (INDEPENDENT_AMBULATORY_CARE_PROVIDER_SITE_OTHER): Payer: 59

## 2012-10-04 DIAGNOSIS — J309 Allergic rhinitis, unspecified: Secondary | ICD-10-CM

## 2012-10-08 ENCOUNTER — Ambulatory Visit (INDEPENDENT_AMBULATORY_CARE_PROVIDER_SITE_OTHER): Payer: 59

## 2012-10-08 DIAGNOSIS — J309 Allergic rhinitis, unspecified: Secondary | ICD-10-CM

## 2012-10-15 ENCOUNTER — Ambulatory Visit (INDEPENDENT_AMBULATORY_CARE_PROVIDER_SITE_OTHER): Payer: 59

## 2012-10-15 DIAGNOSIS — J309 Allergic rhinitis, unspecified: Secondary | ICD-10-CM

## 2012-10-16 ENCOUNTER — Encounter (HOSPITAL_BASED_OUTPATIENT_CLINIC_OR_DEPARTMENT_OTHER): Payer: Self-pay | Admitting: *Deleted

## 2012-10-16 NOTE — Progress Notes (Signed)
[facility]-has had chronic bronchitis-doing very well now-to bring all inhalers and neb meds-

## 2012-10-18 ENCOUNTER — Other Ambulatory Visit: Payer: Self-pay | Admitting: Physician Assistant

## 2012-10-18 NOTE — H&P (Signed)
[facility] [address]   [address] [place], [place] [address] [phone] A Division of [facility]  Loreta Ave, M.D.   Robert A. Thurston Hole, M.D.   Burnell Blanks, M.D.   Eulas Post, M.D.   Lunette Stands, M.D Buford Dresser, M.D.  Charlsie Quest, M.D.   Estell Harpin, M.D.   Melina Fiddler, M.D. Genene Churn. Barry Dienes, PA-C            Kirstin A. Shepperson, PA-C Josh [place], PA-C [place], [place]  [name], [name]   [id number]      DOB: 1956-03-31 PROGRESS NOTE: 10-09-12 Right knee pain.   HPI: The patient is a 56 year old female who presents with one month exacerbation of right knee pain. She states she had an old injury 7-8 years ago where she fell. She did have an MRI worked up and found to have what she believes to be a lateral meniscus tear. At the time she did not have insurance and elected not to have this surgically repaired as recommended. She was seen at [facility]. Symptoms seemed to have improved after that but over the last several weeks it is greatly affecting her activities of daily living. She has attempted to do some yoga stretching and having difficulty with this. Describes mechanical symptoms of clicking, popping and giving way. The pain is intermittent, severe, stabbing, associated swelling. Feels it is getting worse. Exacerbated by activity, exercise and work. Somewhat relieved with Relafen which she has been taking. Also wearing a knee sleeve which does give some relief but is now beginning to have difficulty at work as she is on her feet throughout most of the day. Also has a complaint of triggering of the right ring finger. States this has been going on for some time now. She has tried to continue to live with it but it is becoming more bothersome for her and inquires about this while she is here. Review of Systems:   A 10-point review of systems obtained and positive for  glasses, contacts, pneumonia in May of 2013, blood in the urine, headaches, sleep apnea mild. Past Medical History:   Bronchiectasis, history of pneumonia, mild sleep apnea per patient (does not use CPAP). Past Surgical History:   T&A, cholecystectomy. She states she has had some nausea with anesthesia in the past.  Current Medications:   Relafen mentioned above. Drug allergy to Levaquin.  Family History:   Negative for diabetes, high blood pressure, heart disease, and arthritis.  Social History:   The patient is a nonsmoker, nondrinker. She is married. 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 Continues to work at this time.   EXAMINATION: The patient is seated in the exam room in no acute distress. Alert and oriented. Appears appropriate age. Vital signs stable. Skin:  no rashes or lesions. Cardiac:  regular rate and rhythm. No sign of murmur. Lungs are clear to auscultation bilaterally. Normal breath sounds, normal effort. Abdomen soft, nontender, nondistended. Active bowel sounds in all 4 quadrants. Neuro:  cranial nerves grossly intact. Rectal/breasts:  not indicated for surgery. Musculoskeletal:  examination of right lower extremity shows she is neurovascularly intact. She has intact dorsiflexion and plantar flexion of the ankle. Denies any calf tenderness with palpation. She has tenderness to palpation along the medial joint line. Also mildly along the lateral. Positive McMurray's. Ambulating with an antalgic gait favoring the right lower extremity. Stable ligamentous testing. Has range  of motion to near full extension. Approximately 100 degrees of flexion before she begins to have some discomfort. Examination of right upper extremity again shows patient is neurovascularly intact. She has obvious reproducible triggering of the right ring finger. Tenderness to palpation over the A1 pulley.   IMAGING: 2 views right knee taken today show overall well maintained joint  space. Appears to have some mild narrowing of the medial compartment bilaterally, left greater than right. No acute injury or fracture.   ASSESSMENT:   1.  Acute exacerbation of a previous meniscus tear right knee.  2.  Right ring trigger finger.   PLAN: Regarding the trigger finger, risks and benefits of corticosteroid injection were discussed today and patient wishes to proceed. See procedure note below. Regarding the right knee, we had a long discussion for conservative treatment versus surgical intervention. Would not necessarily require her to obtain a new MRI with patient history given and current clinical findings. Discussed risks and benefits of right knee arthroscopy, possible medial and lateral meniscectomies, possible microfracture, possible chondroplasty. Patient would like to proceed with this as well. Risks and benefits were discussed. 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 She was given prescriptions today for postoperative medications, extra strength Norco and Phenergan to take as needed for nausea as she has had this in the past. Patient co-evaluated with Dr. Madelon Lips today. He is in agreement with treatment plan. It will be general anesthesia with knee block. She will have a preoperative appointment at [facility] set up.   PROCEDURE: The patient's clinical condition is marked by substantial pain and/or significant functional disability. Other conservative therapy has not provided relief, is contraindicated, or not appropriate. There is a reasonable likelihood that injection will significantly improve the patient's pain and/or functional disability.     The patient was seated on the exam table. Proper injection site identified over the A1 pulley right ring finger. Cleaned and prepped in a sterile fashion using Betadine swab. The site was then aspirated and injected using a mixture of Celestone and Marcaine, 1-1/2 cc and  1-1/2 cc. The patient tolerated the procedure well without complication. She had already noticed a resolution of her triggering.   Treyson Axel A. Katlynn Naser, PA-C

## 2012-10-19 ENCOUNTER — Encounter (HOSPITAL_BASED_OUTPATIENT_CLINIC_OR_DEPARTMENT_OTHER): Payer: Self-pay | Admitting: *Deleted

## 2012-10-19 ENCOUNTER — Encounter (HOSPITAL_BASED_OUTPATIENT_CLINIC_OR_DEPARTMENT_OTHER): Payer: Self-pay | Admitting: Certified Registered"

## 2012-10-19 ENCOUNTER — Ambulatory Visit (HOSPITAL_BASED_OUTPATIENT_CLINIC_OR_DEPARTMENT_OTHER): Payer: 59 | Admitting: Certified Registered"

## 2012-10-19 ENCOUNTER — Ambulatory Visit (HOSPITAL_BASED_OUTPATIENT_CLINIC_OR_DEPARTMENT_OTHER)
Admission: RE | Admit: 2012-10-19 | Discharge: 2012-10-19 | Disposition: A | Discharge status: Home / Self Care | Payer: 59 | Source: Ambulatory Visit | Attending: Orthopedic Surgery | Admitting: Orthopedic Surgery

## 2012-10-19 ENCOUNTER — Encounter (HOSPITAL_BASED_OUTPATIENT_CLINIC_OR_DEPARTMENT_OTHER)
Admission: RE | Disposition: A | Discharge status: Home / Self Care | Payer: Self-pay | Source: Ambulatory Visit | Attending: Orthopedic Surgery

## 2012-10-19 DIAGNOSIS — X58XXXA Exposure to other specified factors, initial encounter: Secondary | ICD-10-CM | POA: Insufficient documentation

## 2012-10-19 DIAGNOSIS — M1710 Unilateral primary osteoarthritis, unspecified knee: Secondary | ICD-10-CM | POA: Insufficient documentation

## 2012-10-19 DIAGNOSIS — IMO0002 Reserved for concepts with insufficient information to code with codable children: Secondary | ICD-10-CM | POA: Insufficient documentation

## 2012-10-19 DIAGNOSIS — M2240 Chondromalacia patellae, unspecified knee: Secondary | ICD-10-CM | POA: Insufficient documentation

## 2012-10-19 HISTORY — PX: CHONDROPLASTY: SHX5177

## 2012-10-19 HISTORY — DX: Bronchitis, not specified as acute or chronic: J40

## 2012-10-19 HISTORY — PX: KNEE ARTHROSCOPY: SHX127

## 2012-10-19 HISTORY — PX: KNEE ARTHROSCOPY WITH LATERAL MENISECTOMY: SHX6193

## 2012-10-19 LAB — POCT HEMOGLOBIN-HEMACUE: Hemoglobin: 12.8 g/dL (ref 12.0–15.0)

## 2012-10-19 SURGERY — ARTHROSCOPY, KNEE
Anesthesia: General | Site: Knee | Laterality: Right | Wound class: Clean

## 2012-10-19 MED ORDER — LACTATED RINGERS IV SOLN
INTRAVENOUS | Status: DC
  Administered 2012-10-19: 09:00:00 via INTRAVENOUS

## 2012-10-19 MED ORDER — ONDANSETRON HCL 4 MG/2ML IJ SOLN
INTRAMUSCULAR | Status: DC | PRN
  Administered 2012-10-19: 4 mg via INTRAVENOUS

## 2012-10-19 MED ORDER — FENTANYL CITRATE 0.05 MG/ML IJ SOLN
INTRAMUSCULAR | Status: DC | PRN
  Administered 2012-10-19 (×2): 50 ug via INTRAVENOUS

## 2012-10-19 MED ORDER — METHYLPREDNISOLONE ACETATE 80 MG/ML IJ SUSP
INTRAMUSCULAR | Status: DC | PRN
  Administered 2012-10-19: 80 mg

## 2012-10-19 MED ORDER — BUPIVACAINE-EPINEPHRINE 0.5% -1:200000 IJ SOLN
INTRAMUSCULAR | Status: DC | PRN
  Administered 2012-10-19: 20 mL

## 2012-10-19 MED ORDER — CHLORHEXIDINE GLUCONATE 4 % EX LIQD: 60.0000 mL | Freq: Once | CUTANEOUS | Status: DC

## 2012-10-19 MED ORDER — SODIUM CHLORIDE 0.9 % IR SOLN
Status: DC | PRN
  Administered 2012-10-19: 6000 mL

## 2012-10-19 MED ORDER — LIDOCAINE HCL (CARDIAC) 20 MG/ML IV SOLN
INTRAVENOUS | Status: DC | PRN
  Administered 2012-10-19: 50 mg via INTRAVENOUS

## 2012-10-19 MED ORDER — SODIUM CHLORIDE 0.9 % IV SOLN: INTRAVENOUS | Status: DC

## 2012-10-19 MED ORDER — EPINEPHRINE HCL 1 MG/ML IJ SOLN
INTRAMUSCULAR | Status: DC | PRN
  Administered 2012-10-19: 2 mg

## 2012-10-19 MED ORDER — PROPOFOL 10 MG/ML IV BOLUS
INTRAVENOUS | Status: DC | PRN
  Administered 2012-10-19: 170 mg via INTRAVENOUS

## 2012-10-19 MED ORDER — DEXAMETHASONE SODIUM PHOSPHATE 4 MG/ML IJ SOLN
INTRAMUSCULAR | Status: DC | PRN
  Administered 2012-10-19: 10 mg via INTRAVENOUS

## 2012-10-19 MED ORDER — CEFAZOLIN SODIUM-DEXTROSE 2-3 GM-% IV SOLR
2.0000 g | INTRAVENOUS | Status: AC
  Administered 2012-10-19: 2 g via INTRAVENOUS

## 2012-10-19 MED ORDER — MIDAZOLAM HCL 5 MG/5ML IJ SOLN
INTRAMUSCULAR | Status: DC | PRN
  Administered 2012-10-19: 2 mg via INTRAVENOUS

## 2012-10-19 SURGICAL SUPPLY — 45 items
BANDAGE ELASTIC 6 VELCRO ST LF (GAUZE/BANDAGES/DRESSINGS) IMPLANT
BANDAGE ESMARK 6X9 LF (GAUZE/BANDAGES/DRESSINGS) IMPLANT
BANDAGE GAUZE ELAST BULKY 4 IN (GAUZE/BANDAGES/DRESSINGS) ×2 IMPLANT
BLADE 4.2CUDA (BLADE) ×2 IMPLANT
BLADE CUDA 5.5 (BLADE) IMPLANT
BLADE CUDA GRT WHITE 3.5 (BLADE) IMPLANT
BLADE CUDA SHAVER 3.5 (BLADE) IMPLANT
BLADE CUTTER MENIS 5.5 (BLADE) IMPLANT
BLADE GREAT WHITE 4.2 (BLADE) IMPLANT
BNDG ESMARK 6X9 LF (GAUZE/BANDAGES/DRESSINGS)
BRUSH SCRUB EZ PLAIN DRY (MISCELLANEOUS) IMPLANT
CANISTER OMNI JUG 16 LITER (MISCELLANEOUS) ×2 IMPLANT
CANISTER SUCTION 2500CC (MISCELLANEOUS) IMPLANT
CUTTER MENISCUS  4.2MM (BLADE)
CUTTER MENISCUS 4.2MM (BLADE) IMPLANT
DRAPE ARTHROSCOPY W/POUCH 114 (DRAPES) ×2 IMPLANT
DRSG EMULSION OIL 3X3 NADH (GAUZE/BANDAGES/DRESSINGS) ×2 IMPLANT
DURAPREP 26ML APPLICATOR (WOUND CARE) ×2 IMPLANT
GLOVE BIOGEL PI IND STRL 8 (GLOVE) ×1 IMPLANT
GLOVE BIOGEL PI INDICATOR 8 (GLOVE) ×1
GLOVE EXAM NITRILE MD LF STRL (GLOVE) ×2 IMPLANT
GLOVE SKINSENSE NS SZ7.0 (GLOVE) ×1
GLOVE SKINSENSE STRL SZ7.0 (GLOVE) ×1 IMPLANT
GLOVE SURG ORTHO 8.0 STRL STRW (GLOVE) ×2 IMPLANT
GOWN PREVENTION PLUS XLARGE (GOWN DISPOSABLE) ×2 IMPLANT
GOWN PREVENTION PLUS XXLARGE (GOWN DISPOSABLE) ×2 IMPLANT
HOLDER KNEE FOAM BLUE (MISCELLANEOUS) ×2 IMPLANT
KNEE WRAP E Z 3 GEL PACK (MISCELLANEOUS) ×2 IMPLANT
NDL SAFETY ECLIPSE 18X1.5 (NEEDLE) ×1 IMPLANT
NEEDLE FILTER BLUNT 18X 1/2SAF (NEEDLE)
NEEDLE FILTER BLUNT 18X1 1/2 (NEEDLE) IMPLANT
NEEDLE HYPO 18GX1.5 SHARP (NEEDLE) ×1
PACK ARTHROSCOPY DSU (CUSTOM PROCEDURE TRAY) ×2 IMPLANT
PACK BASIN DAY SURGERY FS (CUSTOM PROCEDURE TRAY) ×2 IMPLANT
SET ARTHROSCOPY TUBING (MISCELLANEOUS) ×1
SET ARTHROSCOPY TUBING LN (MISCELLANEOUS) ×1 IMPLANT
SPONGE GAUZE 4X4 12PLY (GAUZE/BANDAGES/DRESSINGS) ×2 IMPLANT
SUT ETHILON 4 0 PS 2 18 (SUTURE) ×2 IMPLANT
SYR 5ML LL (SYRINGE) ×2 IMPLANT
SYR CONTROL 10ML LL (SYRINGE) ×2 IMPLANT
TOWEL OR 17X24 6PK STRL BLUE (TOWEL DISPOSABLE) ×2 IMPLANT
WAND 3.0 CAPSURE 30 DEG W/CORD (SURGICAL WAND) IMPLANT
WAND 30 DEG SABER W/CORD (SURGICAL WAND) IMPLANT
WAND STAR VAC 90 (SURGICAL WAND) IMPLANT
WATER STERILE IRR 1000ML POUR (IV SOLUTION) ×2 IMPLANT

## 2012-10-19 NOTE — Transfer of Care (Signed)
Immediate Anesthesia Transfer of Care Note  Patient: [name] [name]  Procedure(s) Performed: Procedure(s) (LRB) with comments: ARTHROSCOPY KNEE (Right) KNEE ARTHROSCOPY WITH LATERAL MENISECTOMY (Right) CHONDROPLASTY (Right)  Patient Location: PACU  Anesthesia Type:General  Level of Consciousness: awake, alert , oriented and patient cooperative  Airway & Oxygen Therapy: Patient Spontanous Breathing and Patient connected to face mask oxygen  Post-op Assessment: Report given to PACU RN and Post -op Vital signs reviewed and stable  Post vital signs: Reviewed and stable  Complications: No apparent anesthesia complications

## 2012-10-19 NOTE — Interval H&P Note (Signed)
History and Physical Interval Note:  10/19/2012 9:40 AM  [name] [name]  has presented today for surgery, with the diagnosis of Right knee chondromalacia and meniscus derangement  The various methods of treatment have been discussed with the patient and family. After consideration of risks, benefits and other options for treatment, the patient has consented to  Procedure(s) (LRB) with comments: ARTHROSCOPY KNEE (Right) KNEE ARTHROSCOPY WITH LATERAL MENISECTOMY (Right) CHONDROPLASTY (Right) as a surgical intervention .  The patient's history has been reviewed, patient examined, no change in status, stable for surgery.  I have reviewed the patient's chart and labs.  Questions were answered to the patient's satisfaction.     Roberto Hlavaty JR,W D

## 2012-10-19 NOTE — H&P (View-Only) (Signed)
[facility] [address]   [address] [place], [place] [address] [phone] A Division of [facility]  Daniel F. Murphy, M.D.   Robert A. Wainer, M.D.   W. Dan Caffrey, M.D.   Marlow Hendrie P. Landau, M.D.   Anna Voytek, M.D Anawalt R. Ibazebo, M.D.  S. Michael Tooke, M.D.   James S. Kramer, M.D.   Rebecca S. Bassett, M.D. James M. Owens, PA-C            Kirstin A. Shepperson, PA-C [name] Jazlene Bares, PA-C Brandon Parry, OPA-C  RE: [name], [name]   [id number]      DOB: 03/30/1956 PROGRESS NOTE: 10-09-12 Right knee pain.   HPI: The patient is a 56-year-old female who presents with one month exacerbation of right knee pain. She states she had an old injury 7-8 years ago where she fell. She did have an MRI worked up and found to have what she believes to be a lateral meniscus tear. At the time she did not have insurance and elected not to have this surgically repaired as recommended. She was seen at [facility]. Symptoms seemed to have improved after that but over the last several weeks it is greatly affecting her activities of daily living. She has attempted to do some yoga stretching and having difficulty with this. Describes mechanical symptoms of clicking, popping and giving way. The pain is intermittent, severe, stabbing, associated swelling. Feels it is getting worse. Exacerbated by activity, exercise and work. Somewhat relieved with Relafen which she has been taking. Also wearing a knee sleeve which does give some relief but is now beginning to have difficulty at work as she is on her feet throughout most of the day. Also has a complaint of triggering of the right ring finger. States this has been going on for some time now. She has tried to continue to live with it but it is becoming more bothersome for her and inquires about this while she is here. Review of Systems:   A 10-point review of systems obtained and positive for  glasses, contacts, pneumonia in May of 2013, blood in the urine, headaches, sleep apnea mild. Past Medical History:   Bronchiectasis, history of pneumonia, mild sleep apnea per patient (does not use CPAP). Past Surgical History:   T&A, cholecystectomy. She states she has had some nausea with anesthesia in the past.  Current Medications:   Relafen mentioned above. Drug allergy to Levaquin.  Family History:   Negative for diabetes, high blood pressure, heart disease, and arthritis.  Social History:   The patient is a nonsmoker, nondrinker. She is married. 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 Continues to work at this time.   EXAMINATION: The patient is seated in the exam room in no acute distress. Alert and oriented. Appears appropriate age. Vital signs stable. Skin:  no rashes or lesions. Cardiac:  regular rate and rhythm. No sign of murmur. Lungs are clear to auscultation bilaterally. Normal breath sounds, normal effort. Abdomen soft, nontender, nondistended. Active bowel sounds in all 4 quadrants. Neuro:  cranial nerves grossly intact. Rectal/breasts:  not indicated for surgery. Musculoskeletal:  examination of right lower extremity shows she is neurovascularly intact. She has intact dorsiflexion and plantar flexion of the ankle. Denies any calf tenderness with palpation. She has tenderness to palpation along the medial joint line. Also mildly along the lateral. Positive McMurray's. Ambulating with an antalgic gait favoring the right lower extremity. Stable ligamentous testing. Has range   of motion to near full extension. Approximately 100 degrees of flexion before she begins to have some discomfort. Examination of right upper extremity again shows patient is neurovascularly intact. She has obvious reproducible triggering of the right ring finger. Tenderness to palpation over the A1 pulley.   IMAGING: 2 views right knee taken today show overall well maintained joint  space. Appears to have some mild narrowing of the medial compartment bilaterally, left greater than right. No acute injury or fracture.   ASSESSMENT:   1.  Acute exacerbation of a previous meniscus tear right knee.  2.  Right ring trigger finger.   PLAN: Regarding the trigger finger, risks and benefits of corticosteroid injection were discussed today and patient wishes to proceed. See procedure note below. Regarding the right knee, we had a long discussion for conservative treatment versus surgical intervention. Would not necessarily require her to obtain a new MRI with patient history given and current clinical findings. Discussed risks and benefits of right knee arthroscopy, possible medial and lateral meniscectomies, possible microfracture, possible chondroplasty. Patient would like to proceed with this as well. Risks and benefits were discussed. She is a [employer] employee so this would be done at the [place] facility on an outpatient basis. She was given prescriptions today for postoperative medications, extra strength Norco and Phenergan to take as needed for nausea as she has had this in the past. Patient co-evaluated with Dr. Caffrey today. He is in agreement with treatment plan. It will be general anesthesia with knee block. She will have a preoperative appointment at [facility] set up.   PROCEDURE: The patient's clinical condition is marked by substantial pain and/or significant functional disability. Other conservative therapy has not provided relief, is contraindicated, or not appropriate. There is a reasonable likelihood that injection will significantly improve the patient's pain and/or functional disability.     The patient was seated on the exam table. Proper injection site identified over the A1 pulley right ring finger. Cleaned and prepped in a sterile fashion using Betadine swab. The site was then aspirated and injected using a mixture of Celestone and Marcaine, 1-1/2 cc and  1-1/2 cc. The patient tolerated the procedure well without complication. She had already noticed a resolution of her triggering.   Taiki Buckwalter A. Peretz Thieme, PA-C 

## 2012-10-19 NOTE — Anesthesia Postprocedure Evaluation (Signed)
  Anesthesia Post-op Note  Patient: [name] [name]  Procedure(s) Performed: Procedure(s) (LRB) with comments: ARTHROSCOPY KNEE (Right) KNEE ARTHROSCOPY WITH LATERAL MENISECTOMY (Right) CHONDROPLASTY (Right)  Patient Location: PACU  Anesthesia Type:General  Level of Consciousness: awake, alert  and oriented  Airway and Oxygen Therapy: Patient Spontanous Breathing and Patient connected to face mask oxygen  Post-op Pain: mild  Post-op Assessment: Post-op Vital signs reviewed  Post-op Vital Signs: Reviewed  Complications: No apparent anesthesia complications

## 2012-10-19 NOTE — Anesthesia Procedure Notes (Addendum)
Anesthesia Regional Block:   Narrative:    Procedure Name: LMA Insertion Date/Time: 10/19/2012 9:57 AM Performed by: Verlan Friends Pre-anesthesia Checklist: Patient identified, Emergency Drugs available, Suction available, Patient being monitored and Timeout performed Patient Re-evaluated:Patient Re-evaluated prior to inductionOxygen Delivery Method: Circle System Utilized Preoxygenation: Pre-oxygenation with 100% oxygen Intubation Type: IV induction Ventilation: Mask ventilation without difficulty LMA: LMA with gastric port inserted LMA Size: 4.0 Number of attempts: 1 Placement Confirmation: positive ETCO2 Tube secured with: Tape Dental Injury: Teeth and Oropharynx as per pre-operative assessment

## 2012-10-19 NOTE — Progress Notes (Signed)
Pt. Accepts risk of burn and Dr. Ivin Booty ok with pt. Leaving rings on

## 2012-10-19 NOTE — Anesthesia Preprocedure Evaluation (Signed)
Anesthesia Evaluation  Patient identified by MRN, date of birth, ID band Patient awake    Reviewed: Allergy & Precautions, H&P , NPO status , Patient's Chart, lab work & pertinent test results  History of Anesthesia Complications (+) PONV  Airway Mallampati: I TM Distance: >3 FB     Dental  (+) Teeth Intact and Dental Advisory Given   Pulmonary  breath sounds clear to auscultation        Cardiovascular Rhythm:Regular     Neuro/Psych    GI/Hepatic GERD-  Medicated and Controlled,  Endo/Other  Morbid obesity  Renal/GU      Musculoskeletal   Abdominal   Peds  Hematology   Anesthesia Other Findings   Reproductive/Obstetrics                           Anesthesia Physical Anesthesia Plan  ASA: II  Anesthesia Plan: General   Post-op Pain Management:    Induction: Intravenous  Airway Management Planned: LMA  Additional Equipment:   Intra-op Plan:   Post-operative Plan: Extubation in OR  Informed Consent: I have reviewed the patients History and Physical, chart, labs and discussed the procedure including the risks, benefits and alternatives for the proposed anesthesia with the patient or authorized representative who has indicated his/her understanding and acceptance.   Dental advisory given  Plan Discussed with: CRNA, Anesthesiologist and Surgeon  Anesthesia Plan Comments:         Anesthesia Quick Evaluation

## 2012-10-22 NOTE — Op Note (Signed)
NAME:  [name] [name], [name] [name] NO.:  [id number]  MEDICAL RECORD NO.:  [id number]  LOCATION:                                 FACILITY:  PHYSICIAN:  Dyke Brackett, M.D.    DATE OF BIRTH:  1956/05/25  DATE OF PROCEDURE:  10/19/2012 DATE OF DISCHARGE:                              OPERATIVE REPORT   PREOPERATIVE DIAGNOSES: 1. Small posterior horn medial meniscus tear. 2. Osteoarthritis of patellofemoral joint, medial compartment.  POSTOPERATIVE DIAGNOSES: 1. Small posterior horn medial meniscus tear. 2. Osteoarthritis of patellofemoral joint, medial compartment.  OPERATION: 1. Partial medial meniscectomy. 2. Debridement of chondroplasty, patellofemoral joint, medial     compartment.  SURGEON:  Dyke Brackett, M.D.  ANESTHESIA: General anesthetic with local supplementation.  DESCRIPTION OF PROCEDURE:  Sterile prep and drape, supine position, leg holder.  Inferomedially, lateral portal was created.  The lateral compartment was normal.  There was no osteoarthritis.  Lateral meniscus was thoroughly closed and normal, ACL and PCL normal.  Medial compartment showed a small posterior horn tear, often about 10-15% of the meniscus, which we trimmed and then generalized grade 3 lesion of chondromalacia, particularly noticeable on the femoral condyle with less involvement of the tibia, which was thoroughly debrided.  On the patellofemoral area, the lateral side of the patellofemoral joint was relatively spared on the medial side including the central trochlear groove in the anterior medial femur as well as the medial one half of patella.  There was grade 3 chondromalacia, was debrided, but no grade 4 changes were appreciated.  Thick medial shelf was resected as well. Knee drained free of fluid.  Portals were infiltrated with 10 mL of 0.5% Marcaine and additional 10 mL 0.5% Marcaine with 80 mg Depo-Medrol into the joint.  Lightly compressive sterile dressing were applied.   Taken to the recovery room in stable condition.     Dyke Brackett, M.D.    WDC/MEDQ  D:  10/19/2012  T:  10/20/2012  Job:  [id number]

## 2012-10-25 ENCOUNTER — Ambulatory Visit (INDEPENDENT_AMBULATORY_CARE_PROVIDER_SITE_OTHER): Payer: 59

## 2012-10-25 DIAGNOSIS — J309 Allergic rhinitis, unspecified: Secondary | ICD-10-CM

## 2012-10-26 ENCOUNTER — Ambulatory Visit: Payer: 59 | Admitting: Physical Therapy

## 2012-11-16 ENCOUNTER — Ambulatory Visit (INDEPENDENT_AMBULATORY_CARE_PROVIDER_SITE_OTHER): Payer: 59

## 2012-11-16 ENCOUNTER — Encounter: Payer: Self-pay | Admitting: Internal Medicine

## 2012-11-16 DIAGNOSIS — J309 Allergic rhinitis, unspecified: Secondary | ICD-10-CM

## 2012-11-20 ENCOUNTER — Ambulatory Visit (INDEPENDENT_AMBULATORY_CARE_PROVIDER_SITE_OTHER): Payer: 59

## 2012-11-20 DIAGNOSIS — J309 Allergic rhinitis, unspecified: Secondary | ICD-10-CM

## 2012-12-11 ENCOUNTER — Ambulatory Visit (INDEPENDENT_AMBULATORY_CARE_PROVIDER_SITE_OTHER): Payer: 59

## 2012-12-11 DIAGNOSIS — J309 Allergic rhinitis, unspecified: Secondary | ICD-10-CM

## 2012-12-14 ENCOUNTER — Ambulatory Visit (INDEPENDENT_AMBULATORY_CARE_PROVIDER_SITE_OTHER): Payer: 59

## 2012-12-14 DIAGNOSIS — J309 Allergic rhinitis, unspecified: Secondary | ICD-10-CM

## 2012-12-17 ENCOUNTER — Ambulatory Visit (INDEPENDENT_AMBULATORY_CARE_PROVIDER_SITE_OTHER): Payer: 59

## 2012-12-17 DIAGNOSIS — J309 Allergic rhinitis, unspecified: Secondary | ICD-10-CM

## 2012-12-19 ENCOUNTER — Ambulatory Visit (INDEPENDENT_AMBULATORY_CARE_PROVIDER_SITE_OTHER): Payer: 59

## 2012-12-19 DIAGNOSIS — J309 Allergic rhinitis, unspecified: Secondary | ICD-10-CM

## 2012-12-26 ENCOUNTER — Ambulatory Visit (INDEPENDENT_AMBULATORY_CARE_PROVIDER_SITE_OTHER): Payer: 59

## 2012-12-26 DIAGNOSIS — J309 Allergic rhinitis, unspecified: Secondary | ICD-10-CM

## 2012-12-31 ENCOUNTER — Ambulatory Visit (INDEPENDENT_AMBULATORY_CARE_PROVIDER_SITE_OTHER): Payer: 59

## 2012-12-31 DIAGNOSIS — J309 Allergic rhinitis, unspecified: Secondary | ICD-10-CM

## 2013-01-01 ENCOUNTER — Ambulatory Visit: Payer: 59

## 2013-01-02 ENCOUNTER — Ambulatory Visit: Payer: 59

## 2013-01-02 ENCOUNTER — Ambulatory Visit (INDEPENDENT_AMBULATORY_CARE_PROVIDER_SITE_OTHER): Payer: 59

## 2013-01-02 DIAGNOSIS — J309 Allergic rhinitis, unspecified: Secondary | ICD-10-CM

## 2013-01-03 ENCOUNTER — Ambulatory Visit: Payer: 59

## 2013-01-08 ENCOUNTER — Ambulatory Visit: Payer: 59

## 2013-01-10 ENCOUNTER — Ambulatory Visit (INDEPENDENT_AMBULATORY_CARE_PROVIDER_SITE_OTHER): Payer: 59

## 2013-01-10 DIAGNOSIS — J309 Allergic rhinitis, unspecified: Secondary | ICD-10-CM

## 2013-01-14 ENCOUNTER — Ambulatory Visit: Payer: 59

## 2013-01-15 ENCOUNTER — Encounter: Payer: Self-pay | Admitting: Internal Medicine

## 2013-01-17 ENCOUNTER — Ambulatory Visit (INDEPENDENT_AMBULATORY_CARE_PROVIDER_SITE_OTHER): Payer: 59

## 2013-01-17 DIAGNOSIS — J309 Allergic rhinitis, unspecified: Secondary | ICD-10-CM

## 2013-01-22 ENCOUNTER — Ambulatory Visit (INDEPENDENT_AMBULATORY_CARE_PROVIDER_SITE_OTHER): Payer: 59

## 2013-01-22 DIAGNOSIS — J309 Allergic rhinitis, unspecified: Secondary | ICD-10-CM

## 2013-01-25 ENCOUNTER — Other Ambulatory Visit (HOSPITAL_COMMUNITY): Payer: Self-pay | Admitting: Orthopedic Surgery

## 2013-01-25 ENCOUNTER — Ambulatory Visit (HOSPITAL_COMMUNITY)
Admission: RE | Admit: 2013-01-25 | Discharge: 2013-01-25 | Disposition: A | Discharge status: Home / Self Care | Payer: 59 | Source: Ambulatory Visit | Attending: Orthopedic Surgery | Admitting: Orthopedic Surgery

## 2013-01-25 DIAGNOSIS — M25561 Pain in right knee: Secondary | ICD-10-CM

## 2013-01-25 DIAGNOSIS — IMO0002 Reserved for concepts with insufficient information to code with codable children: Secondary | ICD-10-CM | POA: Insufficient documentation

## 2013-01-25 DIAGNOSIS — M1710 Unilateral primary osteoarthritis, unspecified knee: Secondary | ICD-10-CM | POA: Insufficient documentation

## 2013-01-25 DIAGNOSIS — X58XXXA Exposure to other specified factors, initial encounter: Secondary | ICD-10-CM | POA: Insufficient documentation

## 2013-01-29 ENCOUNTER — Encounter (HOSPITAL_BASED_OUTPATIENT_CLINIC_OR_DEPARTMENT_OTHER): Payer: Self-pay | Admitting: *Deleted

## 2013-01-29 NOTE — Progress Notes (Signed)
Pt RN at [facility] here for this knee 11/13-no chg-asthma stable

## 2013-01-31 ENCOUNTER — Other Ambulatory Visit: Payer: Self-pay | Admitting: Physician Assistant

## 2013-01-31 NOTE — H&P (Signed)
[name] [name] is an 56 y.o. female.   Chief Complaint: recurrent right knee medial meniscus tear HPI: pt is a 56yo female underwent right knee arthroscopy debridement, partial medial meniscectomy 20-30% on 10/19/12, she had done extremely well with until she presented to the office on 01/17/13.  She did not really receive much relief with po NSAIDs or a cortisone injection, we elected to repeat MRI showing recurrent right medial meniscal tear.  Feels it may have occurred with subsequent events with activity or in therapy but without specific trauma.  Past Medical History  Diagnosis Date  . Bronchiectasis   . COPD (chronic obstructive pulmonary disease)   . Hypoxemia   . GERD (gastroesophageal reflux disease)   . Pneumonia   . Asthma     reactive airway disease  . Bronchitis     chronic  . Wears glasses   . PONV (postoperative nausea and vomiting)     Past Surgical History  Procedure Laterality Date  . Tonsillectomy    . Tubal ligation    . Cholecystectomy      2012  . Colonoscopy    . Wisdom tooth extraction    . Knee arthroscopy  10/19/2012    Procedure: ARTHROSCOPY KNEE;  Surgeon: W D Caffrey Jr., MD;  Location: [facility];  Service: Orthopedics;  Laterality: Right;  . Knee arthroscopy with lateral menisectomy  10/19/2012    Procedure: KNEE ARTHROSCOPY WITH LATERAL MENISECTOMY;  Surgeon: W D Caffrey Jr., MD;  Location: [facility];  Service: Orthopedics;  Laterality: Right;  . Chondroplasty  10/19/2012    Procedure: CHONDROPLASTY;  Surgeon: W D Caffrey Jr., MD;  Location: [facility];  Service: Orthopedics;  Laterality: Right;    Family History  Problem Relation Age of Onset  . Lung cancer Father 56    dies age 56  . Emphysema Father   . COPD Brother 50    died age 50  . Stroke Mother    Social History:  reports that she has never smoked. She has never used smokeless tobacco. She reports that she does not drink alcohol or use  illicit drugs.  Allergies:  Allergies  Allergen Reactions  . Levaquin (Levofloxacin)     Pain in muscles     (Not in a hospital admission)  No results found for this or any previous visit (from the past 48 hour(s)). No results found.  Review of Systems  Constitutional: Negative.   HENT: Negative for hearing loss, ear pain and tinnitus.   Eyes: Negative for blurred vision and double vision.  Respiratory: Negative.   Cardiovascular: Negative.   Gastrointestinal: Negative for nausea, vomiting and abdominal pain.  Genitourinary: Positive for hematuria.  Musculoskeletal: Positive for joint pain. Negative for falls.  Skin: Negative.   Neurological: Positive for headaches. Negative for dizziness, tingling, sensory change and loss of consciousness.  Endo/Heme/Allergies: Does not bruise/bleed easily.  Psychiatric/Behavioral: Negative for depression, suicidal ideas and substance abuse.    There were no vitals taken for this visit. Physical Exam  Constitutional: She is oriented to person, place, and time. She appears well-developed and well-nourished. No distress.  HENT:  Head: Normocephalic and atraumatic.  Nose: Nose normal.  Eyes: Conjunctivae and EOM are normal. Pupils are equal, round, and reactive to light.  Neck: Normal range of motion. Neck supple.  Cardiovascular: Normal rate and regular rhythm.   Respiratory: Effort normal and breath sounds normal. No respiratory distress.  GI: Soft. Bowel sounds are normal.    Musculoskeletal:       Right knee: She exhibits decreased range of motion and swelling. Tenderness found.  Neurological: She is alert and oriented to person, place, and time. No cranial nerve deficit.  Skin: Skin is warm and dry. No rash noted. No erythema.  Psychiatric: She has a normal mood and affect. Her behavior is normal.     Assessment/Plan Recurrent right medial meniscus tear  Discussed risks and benefits of repeat right knee arthroscopy with medial  meniscectomy and patient wishes to proceed.  This will be done as an outpatient procedure at  [facility].  1 hour, general possible knee block.  She was given Norco for pain control following first knee scope.    Remer Couse 01/31/2013, 5:10 PM    

## 2013-02-01 ENCOUNTER — Ambulatory Visit (HOSPITAL_BASED_OUTPATIENT_CLINIC_OR_DEPARTMENT_OTHER)
Admission: RE | Admit: 2013-02-01 | Discharge: 2013-02-01 | Disposition: A | Discharge status: Home / Self Care | Payer: 59 | Source: Ambulatory Visit | Attending: Orthopedic Surgery | Admitting: Orthopedic Surgery

## 2013-02-01 ENCOUNTER — Encounter (HOSPITAL_BASED_OUTPATIENT_CLINIC_OR_DEPARTMENT_OTHER): Payer: Self-pay | Admitting: Anesthesiology

## 2013-02-01 ENCOUNTER — Encounter (HOSPITAL_BASED_OUTPATIENT_CLINIC_OR_DEPARTMENT_OTHER): Payer: Self-pay | Admitting: *Deleted

## 2013-02-01 ENCOUNTER — Ambulatory Visit (HOSPITAL_BASED_OUTPATIENT_CLINIC_OR_DEPARTMENT_OTHER): Payer: 59 | Admitting: Anesthesiology

## 2013-02-01 ENCOUNTER — Encounter (HOSPITAL_BASED_OUTPATIENT_CLINIC_OR_DEPARTMENT_OTHER)
Admission: RE | Disposition: A | Discharge status: Home / Self Care | Payer: Self-pay | Source: Ambulatory Visit | Attending: Orthopedic Surgery

## 2013-02-01 DIAGNOSIS — X58XXXA Exposure to other specified factors, initial encounter: Secondary | ICD-10-CM | POA: Insufficient documentation

## 2013-02-01 DIAGNOSIS — J449 Chronic obstructive pulmonary disease, unspecified: Secondary | ICD-10-CM | POA: Insufficient documentation

## 2013-02-01 DIAGNOSIS — IMO0002 Reserved for concepts with insufficient information to code with codable children: Secondary | ICD-10-CM | POA: Insufficient documentation

## 2013-02-01 DIAGNOSIS — J4489 Other specified chronic obstructive pulmonary disease: Secondary | ICD-10-CM | POA: Insufficient documentation

## 2013-02-01 DIAGNOSIS — J45909 Unspecified asthma, uncomplicated: Secondary | ICD-10-CM | POA: Insufficient documentation

## 2013-02-01 DIAGNOSIS — K219 Gastro-esophageal reflux disease without esophagitis: Secondary | ICD-10-CM | POA: Insufficient documentation

## 2013-02-01 DIAGNOSIS — J479 Bronchiectasis, uncomplicated: Secondary | ICD-10-CM | POA: Insufficient documentation

## 2013-02-01 DIAGNOSIS — R51 Headache: Secondary | ICD-10-CM | POA: Insufficient documentation

## 2013-02-01 HISTORY — PX: KNEE ARTHROSCOPY: SHX127

## 2013-02-01 LAB — POCT HEMOGLOBIN-HEMACUE: Hemoglobin: 13.1 g/dL (ref 12.0–15.0)

## 2013-02-01 SURGERY — ARTHROSCOPY, KNEE
Anesthesia: General | Site: Knee | Laterality: Right | Wound class: Clean

## 2013-02-01 MED ORDER — OXYCODONE HCL 5 MG PO TABS: 5.0000 mg | ORAL_TABLET | Freq: Once | ORAL | Status: DC | PRN

## 2013-02-01 MED ORDER — CHLORHEXIDINE GLUCONATE 4 % EX LIQD: 60.0000 mL | Freq: Once | CUTANEOUS | Status: DC

## 2013-02-01 MED ORDER — MIDAZOLAM HCL 2 MG/2ML IJ SOLN: 1.0000 mg | INTRAMUSCULAR | Status: DC | PRN

## 2013-02-01 MED ORDER — ONDANSETRON HCL 4 MG/2ML IJ SOLN
INTRAMUSCULAR | Status: DC | PRN
  Administered 2013-02-01: 4 mg via INTRAVENOUS

## 2013-02-01 MED ORDER — HYDROMORPHONE HCL PF 1 MG/ML IJ SOLN
0.2500 mg | INTRAMUSCULAR | Status: DC | PRN
  Administered 2013-02-01 (×4): 0.5 mg via INTRAVENOUS

## 2013-02-01 MED ORDER — OXYCODONE HCL 5 MG/5ML PO SOLN: 5.0000 mg | Freq: Once | ORAL | Status: DC | PRN

## 2013-02-01 MED ORDER — PROPOFOL 10 MG/ML IV BOLUS
INTRAVENOUS | Status: DC | PRN
Start: 2013-02-01 — End: 2013-02-01
  Administered 2013-02-01: 200 mg via INTRAVENOUS

## 2013-02-01 MED ORDER — BUPIVACAINE-EPINEPHRINE 0.5% -1:200000 IJ SOLN
INTRAMUSCULAR | Status: DC | PRN
  Administered 2013-02-01: 30 mL

## 2013-02-01 MED ORDER — FENTANYL CITRATE 0.05 MG/ML IJ SOLN: 50.0000 ug | INTRAMUSCULAR | Status: DC | PRN

## 2013-02-01 MED ORDER — LACTATED RINGERS IV SOLN
INTRAVENOUS | Status: DC
  Administered 2013-02-01 (×2): via INTRAVENOUS

## 2013-02-01 MED ORDER — SODIUM CHLORIDE 0.9 % IR SOLN
Status: DC | PRN
  Administered 2013-02-01: 11:00:00

## 2013-02-01 MED ORDER — CEFAZOLIN SODIUM-DEXTROSE 2-3 GM-% IV SOLR
2.0000 g | INTRAVENOUS | Status: AC
  Administered 2013-02-01: 2 g via INTRAVENOUS

## 2013-02-01 MED ORDER — SODIUM CHLORIDE 0.9 % IV SOLN: INTRAVENOUS | Status: DC

## 2013-02-01 MED ORDER — FENTANYL CITRATE 0.05 MG/ML IJ SOLN
INTRAMUSCULAR | Status: DC | PRN
  Administered 2013-02-01 (×2): 25 ug via INTRAVENOUS
  Administered 2013-02-01: 50 ug via INTRAVENOUS

## 2013-02-01 MED ORDER — MIDAZOLAM HCL 5 MG/5ML IJ SOLN
INTRAMUSCULAR | Status: DC | PRN
  Administered 2013-02-01: 2 mg via INTRAVENOUS

## 2013-02-01 MED ORDER — DEXAMETHASONE SODIUM PHOSPHATE 4 MG/ML IJ SOLN
INTRAMUSCULAR | Status: DC | PRN
  Administered 2013-02-01: 10 mg via INTRAVENOUS

## 2013-02-01 MED ORDER — METOCLOPRAMIDE HCL 5 MG/ML IJ SOLN: 10.0000 mg | Freq: Once | INTRAMUSCULAR | Status: DC | PRN

## 2013-02-01 MED ORDER — LIDOCAINE HCL (CARDIAC) 20 MG/ML IV SOLN
INTRAVENOUS | Status: DC | PRN
  Administered 2013-02-01: 75 mg via INTRAVENOUS

## 2013-02-01 MED ORDER — MORPHINE SULFATE 4 MG/ML IJ SOLN
INTRAMUSCULAR | Status: DC | PRN
  Administered 2013-02-01: 4 mg

## 2013-02-01 SURGICAL SUPPLY — 44 items
BANDAGE ELASTIC 6 VELCRO ST LF (GAUZE/BANDAGES/DRESSINGS) ×2 IMPLANT
BANDAGE ESMARK 6X9 LF (GAUZE/BANDAGES/DRESSINGS) IMPLANT
BANDAGE GAUZE ELAST BULKY 4 IN (GAUZE/BANDAGES/DRESSINGS) ×2 IMPLANT
BLADE 4.2CUDA (BLADE) ×2 IMPLANT
BLADE CUDA 5.5 (BLADE) IMPLANT
BLADE CUDA GRT WHITE 3.5 (BLADE) ×2 IMPLANT
BLADE CUDA SHAVER 3.5 (BLADE) IMPLANT
BLADE CUTTER MENIS 5.5 (BLADE) IMPLANT
BLADE GREAT WHITE 4.2 (BLADE) IMPLANT
BNDG ESMARK 6X9 LF (GAUZE/BANDAGES/DRESSINGS)
BRUSH SCRUB EZ PLAIN DRY (MISCELLANEOUS) IMPLANT
CANISTER OMNI JUG 16 LITER (MISCELLANEOUS) ×2 IMPLANT
CANISTER SUCTION 2500CC (MISCELLANEOUS) IMPLANT
CUTTER MENISCUS  4.2MM (BLADE)
CUTTER MENISCUS 4.2MM (BLADE) IMPLANT
DRAPE ARTHROSCOPY W/POUCH 114 (DRAPES) ×2 IMPLANT
DRSG EMULSION OIL 3X3 NADH (GAUZE/BANDAGES/DRESSINGS) ×2 IMPLANT
DURAPREP 26ML APPLICATOR (WOUND CARE) ×2 IMPLANT
GLOVE BIOGEL PI IND STRL 8 (GLOVE) ×1 IMPLANT
GLOVE BIOGEL PI INDICATOR 8 (GLOVE) ×1
GLOVE ECLIPSE 6.5 STRL STRAW (GLOVE) ×4 IMPLANT
GLOVE EXAM NITRILE PF MED BLUE (GLOVE) ×2 IMPLANT
GLOVE INDICATOR 7.0 STRL GRN (GLOVE) ×4 IMPLANT
GLOVE SURG ORTHO 8.0 STRL STRW (GLOVE) ×2 IMPLANT
GOWN PREVENTION PLUS XLARGE (GOWN DISPOSABLE) ×4 IMPLANT
GOWN PREVENTION PLUS XXLARGE (GOWN DISPOSABLE) ×2 IMPLANT
HOLDER KNEE FOAM BLUE (MISCELLANEOUS) ×2 IMPLANT
KNEE WRAP E Z 3 GEL PACK (MISCELLANEOUS) ×2 IMPLANT
NDL SAFETY ECLIPSE 18X1.5 (NEEDLE) ×1 IMPLANT
NEEDLE FILTER BLUNT 18X 1/2SAF (NEEDLE)
NEEDLE FILTER BLUNT 18X1 1/2 (NEEDLE) IMPLANT
NEEDLE HYPO 18GX1.5 SHARP (NEEDLE) ×1
PACK ARTHROSCOPY DSU (CUSTOM PROCEDURE TRAY) ×2 IMPLANT
PACK BASIN DAY SURGERY FS (CUSTOM PROCEDURE TRAY) ×2 IMPLANT
SET ARTHROSCOPY TUBING (MISCELLANEOUS) ×1
SET ARTHROSCOPY TUBING LN (MISCELLANEOUS) ×1 IMPLANT
SPONGE GAUZE 4X4 12PLY (GAUZE/BANDAGES/DRESSINGS) ×2 IMPLANT
SUT ETHILON 4 0 PS 2 18 (SUTURE) ×2 IMPLANT
SYR 5ML LL (SYRINGE) ×2 IMPLANT
TOWEL OR 17X24 6PK STRL BLUE (TOWEL DISPOSABLE) ×2 IMPLANT
WAND 3.0 CAPSURE 30 DEG W/CORD (SURGICAL WAND) IMPLANT
WAND 30 DEG SABER W/CORD (SURGICAL WAND) IMPLANT
WAND STAR VAC 90 (SURGICAL WAND) IMPLANT
WATER STERILE IRR 1000ML POUR (IV SOLUTION) ×2 IMPLANT

## 2013-02-01 NOTE — Transfer of Care (Signed)
Immediate Anesthesia Transfer of Care Note  Patient: [name] [name]  Procedure(s) Performed: Procedure(s) with comments: RIGHT KNEE ARTHROSCOPY WITH MEDIAL OR LATERAL MENISCECTOMY (Right) - RIGHT KNEE ARTHROSCOPY WITH MEDIAL MENISCECTOMY, DEBRIDEMENT CHONDROMALACIA PATELLA  Patient Location: PACU  Anesthesia Type:General  Level of Consciousness: sedated and patient cooperative  Airway & Oxygen Therapy: Patient Spontanous Breathing and Patient connected to face mask oxygen  Post-op Assessment: Report given to PACU RN and Post -op Vital signs reviewed and stable  Post vital signs: Reviewed and stable  Complications: No apparent anesthesia complications

## 2013-02-01 NOTE — H&P (View-Only) (Signed)
[name] [name] is an 57 y.o. female.   Chief Complaint: recurrent right knee medial meniscus tear HPI: pt is a 57yo female underwent right knee arthroscopy debridement, partial medial meniscectomy 20-30% on 10/19/12, she had done extremely well with until she presented to the office on 01/17/13.  She did not really receive much relief with po NSAIDs or a cortisone injection, we elected to repeat MRI showing recurrent right medial meniscal tear.  Feels it may have occurred with subsequent events with activity or in therapy but without specific trauma.  Past Medical History  Diagnosis Date  . Bronchiectasis   . COPD (chronic obstructive pulmonary disease)   . Hypoxemia   . GERD (gastroesophageal reflux disease)   . Pneumonia   . Asthma     reactive airway disease  . Bronchitis     chronic  . Wears glasses   . PONV (postoperative nausea and vomiting)     Past Surgical History  Procedure Laterality Date  . Tonsillectomy    . Tubal ligation    . Cholecystectomy      2012  . Colonoscopy    . Wisdom tooth extraction    . Knee arthroscopy  10/19/2012    Procedure: ARTHROSCOPY KNEE;  Surgeon: Thera Flake., MD;  Location: [facility];  Service: Orthopedics;  Laterality: Right;  . Knee arthroscopy with lateral menisectomy  10/19/2012    Procedure: KNEE ARTHROSCOPY WITH LATERAL MENISECTOMY;  Surgeon: Thera Flake., MD;  Location: [facility];  Service: Orthopedics;  Laterality: Right;  . Chondroplasty  10/19/2012    Procedure: CHONDROPLASTY;  Surgeon: Thera Flake., MD;  Location: [facility];  Service: Orthopedics;  Laterality: Right;    Family History  Problem Relation Age of Onset  . Lung cancer Father 77    dies age 66  . Emphysema Father   . COPD Brother 92    died age 32  . Stroke Mother    Social History:  reports that she has never smoked. She has never used smokeless tobacco. She reports that she does not drink alcohol or use  illicit drugs.  Allergies:  Allergies  Allergen Reactions  . Levaquin (Levofloxacin)     Pain in muscles     (Not in a hospital admission)  No results found for this or any previous visit (from the past 48 hour(s)). No results found.  Review of Systems  Constitutional: Negative.   HENT: Negative for hearing loss, ear pain and tinnitus.   Eyes: Negative for blurred vision and double vision.  Respiratory: Negative.   Cardiovascular: Negative.   Gastrointestinal: Negative for nausea, vomiting and abdominal pain.  Genitourinary: Positive for hematuria.  Musculoskeletal: Positive for joint pain. Negative for falls.  Skin: Negative.   Neurological: Positive for headaches. Negative for dizziness, tingling, sensory change and loss of consciousness.  Endo/Heme/Allergies: Does not bruise/bleed easily.  Psychiatric/Behavioral: Negative for depression, suicidal ideas and substance abuse.    There were no vitals taken for this visit. Physical Exam  Constitutional: She is oriented to person, place, and time. She appears well-developed and well-nourished. No distress.  HENT:  Head: Normocephalic and atraumatic.  Nose: Nose normal.  Eyes: Conjunctivae and EOM are normal. Pupils are equal, round, and reactive to light.  Neck: Normal range of motion. Neck supple.  Cardiovascular: Normal rate and regular rhythm.   Respiratory: Effort normal and breath sounds normal. No respiratory distress.  GI: Soft. Bowel sounds are normal.  Musculoskeletal:       Right knee: She exhibits decreased range of motion and swelling. Tenderness found.  Neurological: She is alert and oriented to person, place, and time. No cranial nerve deficit.  Skin: Skin is warm and dry. No rash noted. No erythema.  Psychiatric: She has a normal mood and affect. Her behavior is normal.     Assessment/Plan Recurrent right medial meniscus tear  Discussed risks and benefits of repeat right knee arthroscopy with medial  meniscectomy and patient wishes to proceed.  This will be done as an outpatient procedure at [facility].  1 hour, general possible knee block.  She was given Norco for pain control following first knee scope.    Margart Sickles 01/31/2013, 5:10 PM

## 2013-02-01 NOTE — Anesthesia Procedure Notes (Signed)
Procedure Name: LMA Insertion Date/Time: 02/01/2013 10:07 AM Performed by: Gar Gibbon Pre-anesthesia Checklist: Patient identified, Emergency Drugs available, Suction available and Patient being monitored Patient Re-evaluated:Patient Re-evaluated prior to inductionOxygen Delivery Method: Circle System Utilized Preoxygenation: Pre-oxygenation with 100% oxygen Intubation Type: IV induction Ventilation: Mask ventilation without difficulty LMA: LMA inserted LMA Size: 4.0 Number of attempts: 1 Airway Equipment and Method: bite block Placement Confirmation: positive ETCO2 Tube secured with: Tape Dental Injury: Teeth and Oropharynx as per pre-operative assessment

## 2013-02-01 NOTE — Interval H&P Note (Signed)
History and Physical Interval Note:  02/01/2013 9:59 AM  [name] [name]  has presented today for surgery, with the diagnosis of RIGHT TEAR MENISCUS MEDIAL KNEE, CHONDROMALACIA-PATELLA 836, 717.7  The various methods of treatment have been discussed with the patient and family. After consideration of risks, benefits and other options for treatment, the patient has consented to  Procedure(s) with comments: RIGHT KNEE ARTHROSCOPY WITH MEDIAL OR LATERAL MENISCECTOMY (Right) - RIGHT KNEE ARTHROSCOPY WITH MEDIAL OR LATERAL MENISCECTOMY as a surgical intervention .  The patient's history has been reviewed, patient examined, no change in status, stable for surgery.  I have reviewed the patient's chart and labs.  Questions were answered to the patient's satisfaction.     Laroy Mustard JR,W D

## 2013-02-01 NOTE — Anesthesia Preprocedure Evaluation (Signed)
Anesthesia Evaluation  Patient identified by MRN, date of birth, ID band Patient awake    Reviewed: Allergy & Precautions, H&P , NPO status , Patient's Chart, lab work & pertinent test results, reviewed documented beta blocker date and time   History of Anesthesia Complications (+) PONV  Airway Mallampati: II TM Distance: >3 FB Neck ROM: full    Dental   Pulmonary shortness of breath and with exertion, asthma , pneumonia -, resolved, COPD COPD inhaler,  breath sounds clear to auscultation        Cardiovascular negative cardio ROS  Rhythm:regular     Neuro/Psych negative neurological ROS  negative psych ROS   GI/Hepatic Neg liver ROS, GERD-  Medicated and Controlled,  Endo/Other  negative endocrine ROS  Renal/GU negative Renal ROS  negative genitourinary   Musculoskeletal   Abdominal   Peds  Hematology negative hematology ROS (+)   Anesthesia Other Findings See surgeon's H&P   Reproductive/Obstetrics negative OB ROS                           Anesthesia Physical Anesthesia Plan  ASA: III  Anesthesia Plan: General   Post-op Pain Management:    Induction: Intravenous  Airway Management Planned: LMA  Additional Equipment:   Intra-op Plan:   Post-operative Plan: Extubation in OR  Informed Consent: I have reviewed the patients History and Physical, chart, labs and discussed the procedure including the risks, benefits and alternatives for the proposed anesthesia with the patient or authorized representative who has indicated his/her understanding and acceptance.   Dental Advisory Given  Plan Discussed with: CRNA and Surgeon  Anesthesia Plan Comments:         Anesthesia Quick Evaluation

## 2013-02-01 NOTE — Anesthesia Postprocedure Evaluation (Signed)
Anesthesia Post Note  Patient: [name] [name]  Procedure(s) Performed: Procedure(s) (LRB): RIGHT KNEE ARTHROSCOPY WITH MEDIAL OR LATERAL MENISCECTOMY (Right)  Anesthesia type: General  Patient location: PACU  Post pain: Pain level controlled  Post assessment: Patient's Cardiovascular Status Stable  Last Vitals:  Filed Vitals:   02/01/13 1200  BP: 131/75  Pulse: 77  Temp:   Resp: 17    Post vital signs: Reviewed and stable  Level of consciousness: alert  Complications: No apparent anesthesia complications

## 2013-02-04 ENCOUNTER — Encounter (HOSPITAL_BASED_OUTPATIENT_CLINIC_OR_DEPARTMENT_OTHER): Payer: Self-pay | Admitting: Orthopedic Surgery

## 2013-02-04 NOTE — Op Note (Signed)
NAME:  [name] [name], QUINTELA NO.:  [id number]  MEDICAL RECORD NO.:  [id number]  LOCATION:                                 FACILITY:  PHYSICIAN:  Dyke Brackett, M.D.    DATE OF BIRTH:  10-Oct-1956  DATE OF PROCEDURE:  02/01/2013 DATE OF DISCHARGE:                              OPERATIVE REPORT   INDICATIONS:  Patient with recurrent medial knee pain after a knee arthroscopy in the late fall with new MRI showing recurrent root tear of the meniscus, thought to be amenable outpatient surgery.  PREOPERATIVE DIAGNOSES: 1. Medial meniscus tear. 2. Grade 3 osteoarthritis patellofemoral joint, medial compartment.  POSTOPERATIVE DIAGNOSES: 1. Medial meniscus tear. 2. Grade 3 osteoarthritis patellofemoral joint, medial compartment.  OPERATION: 1. Partial medial meniscectomy (posterior horn). 2. Debridement chondroplasty medial compartment patellofemoral joint.  SURGEON:  Dyke Brackett, MD  ANESTHESIA:  General with local.  DESCRIPTION:  Using the old inferomedial inferolateral portals, we systematically inspected the knee, lateral compartment, lateral meniscus was normal.  The patellofemoral joint showed again some diffuse grade 3 changes, particularly on the patellofemoral which were debrided.  Medial compartment showed very diffuse grade 3, but no grade 4 changes over the majority of the compartment medially on the femoral condyle as well as the plateau.  There was route tear of the actual meniscus, I trimmed back to a stable edge, but unfortunately she had gone on the tear of the root at the posterior attachment leading to the need to resect the whole posterior horn.  It was estimated that 40% of the meniscus was remaining mainly anteriorly and somewhat medially.  Again, we re-debrided the bone surface as well.  Knee drained free of fluid.  Portals closed with nylon, infiltrated 0.5% Marcaine, 15 mL with 4 mg of morphine with an additional 20 mL into the skin portals,  taken to recovery room in stable condition.     Dyke Brackett, M.D.    WDC/MEDQ  D:  02/01/2013  T:  02/01/2013  Job:  [id number]

## 2013-02-14 ENCOUNTER — Ambulatory Visit (INDEPENDENT_AMBULATORY_CARE_PROVIDER_SITE_OTHER): Payer: 59

## 2013-02-14 DIAGNOSIS — J309 Allergic rhinitis, unspecified: Secondary | ICD-10-CM

## 2013-02-21 ENCOUNTER — Ambulatory Visit (INDEPENDENT_AMBULATORY_CARE_PROVIDER_SITE_OTHER): Payer: 59

## 2013-02-21 DIAGNOSIS — J309 Allergic rhinitis, unspecified: Secondary | ICD-10-CM

## 2013-02-25 ENCOUNTER — Telehealth: Payer: Self-pay | Admitting: Internal Medicine

## 2013-02-25 ENCOUNTER — Ambulatory Visit: Payer: 59

## 2013-02-25 ENCOUNTER — Other Ambulatory Visit: Payer: Self-pay | Admitting: Internal Medicine

## 2013-02-25 MED ORDER — MONTELUKAST SODIUM 10 MG PO TABS: 10.0000 mg | ORAL_TABLET | Freq: Every day | ORAL | Status: DC

## 2013-02-25 NOTE — Telephone Encounter (Signed)
Pt called back & asked to be worked in w/ CY this wk.  I offered 3/18 @ 10:30.  Pt states she can not make this appt due to physical therapy.  Please advise if pt can be worked in for a different date/time.  [name] [name]

## 2013-02-25 NOTE — Telephone Encounter (Signed)
Rx was refilled LMTCB

## 2013-02-26 ENCOUNTER — Ambulatory Visit (INDEPENDENT_AMBULATORY_CARE_PROVIDER_SITE_OTHER): Payer: 59

## 2013-02-26 DIAGNOSIS — J309 Allergic rhinitis, unspecified: Secondary | ICD-10-CM

## 2013-02-26 NOTE — Telephone Encounter (Signed)
lmomtcb for pt I do not see a cell phone # listed for pt

## 2013-02-26 NOTE — Telephone Encounter (Signed)
Returning call can be reached at [phone].[name] [name]

## 2013-02-26 NOTE — Telephone Encounter (Signed)
I spoke with pt. Aware RX has been sent. Pt will make appt at next available. Nothing further was needed

## 2013-02-28 ENCOUNTER — Other Ambulatory Visit (HOSPITAL_COMMUNITY): Payer: Self-pay | Admitting: Orthopedic Surgery

## 2013-02-28 ENCOUNTER — Ambulatory Visit (HOSPITAL_COMMUNITY)
Admission: RE | Admit: 2013-02-28 | Discharge: 2013-02-28 | Disposition: A | Discharge status: Home / Self Care | Payer: 59 | Source: Ambulatory Visit | Attending: Orthopedic Surgery | Admitting: Orthopedic Surgery

## 2013-02-28 DIAGNOSIS — X58XXXA Exposure to other specified factors, initial encounter: Secondary | ICD-10-CM | POA: Insufficient documentation

## 2013-02-28 DIAGNOSIS — M2240 Chondromalacia patellae, unspecified knee: Secondary | ICD-10-CM | POA: Insufficient documentation

## 2013-02-28 DIAGNOSIS — M25569 Pain in unspecified knee: Secondary | ICD-10-CM | POA: Insufficient documentation

## 2013-02-28 DIAGNOSIS — M25562 Pain in left knee: Secondary | ICD-10-CM

## 2013-02-28 DIAGNOSIS — IMO0002 Reserved for concepts with insufficient information to code with codable children: Secondary | ICD-10-CM | POA: Insufficient documentation

## 2013-02-28 DIAGNOSIS — M6740 Ganglion, unspecified site: Secondary | ICD-10-CM | POA: Insufficient documentation

## 2013-03-01 ENCOUNTER — Ambulatory Visit (HOSPITAL_COMMUNITY): Payer: 59

## 2013-03-01 ENCOUNTER — Ambulatory Visit (INDEPENDENT_AMBULATORY_CARE_PROVIDER_SITE_OTHER): Payer: 59

## 2013-03-01 DIAGNOSIS — J309 Allergic rhinitis, unspecified: Secondary | ICD-10-CM

## 2013-03-04 ENCOUNTER — Ambulatory Visit (INDEPENDENT_AMBULATORY_CARE_PROVIDER_SITE_OTHER): Payer: 59

## 2013-03-04 DIAGNOSIS — J309 Allergic rhinitis, unspecified: Secondary | ICD-10-CM

## 2013-03-07 ENCOUNTER — Ambulatory Visit: Payer: 59

## 2013-03-08 ENCOUNTER — Ambulatory Visit (INDEPENDENT_AMBULATORY_CARE_PROVIDER_SITE_OTHER): Payer: 59

## 2013-03-08 DIAGNOSIS — J309 Allergic rhinitis, unspecified: Secondary | ICD-10-CM

## 2013-03-12 ENCOUNTER — Encounter (HOSPITAL_BASED_OUTPATIENT_CLINIC_OR_DEPARTMENT_OTHER): Payer: Self-pay | Admitting: *Deleted

## 2013-03-12 DIAGNOSIS — M2242 Chondromalacia patellae, left knee: Secondary | ICD-10-CM

## 2013-03-12 HISTORY — DX: Chondromalacia patellae, left knee: M22.42

## 2013-03-14 ENCOUNTER — Other Ambulatory Visit: Payer: Self-pay | Admitting: Physician Assistant

## 2013-03-14 NOTE — H&P (Signed)
[name] [name] is an 56 y.o. female.   Chief Complaint: left medial meniscus tear HPI: pt is a 56yo female underwent right knee arthroscopy x2 relatively close together presented to outpatient office with left knee complaints MRI revealed medial meniscus tear.  Patient is a [employer] and would need [facility] performed at [employer] [facility].  Past Medical History  Diagnosis Date  . Bronchiectasis   . COPD (chronic obstructive pulmonary disease)   . GERD (gastroesophageal reflux disease)   . Bronchitis     chronic  . PONV (postoperative nausea and vomiting)   . Hx of migraines   . H/O hiatal hernia     "sliding"  . Osteoarthritis     knees  . Positional headache since 1997    if lies on left side  . Depression   . Anxiety   . 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 Dental crown present     upper  . Abrasion of wrist, left 03/12/2013  . Environmental allergies     receives allergy shots  . Glaucoma high risk     is monitored every 6 mos. - no current med.    Past Surgical History  Procedure Laterality Date  . Tonsillectomy and adenoidectomy      age 4  . Tubal ligation  1986  . Colonoscopy    . Wisdom tooth extraction    . Knee arthroscopy  10/19/2012    Procedure: ARTHROSCOPY KNEE;  Surgeon: W D Caffrey Jr., MD;  Location: [facility];  Service: Orthopedics;  Laterality: Right;  . Knee arthroscopy with lateral menisectomy  10/19/2012    Procedure: KNEE ARTHROSCOPY WITH LATERAL MENISECTOMY;  Surgeon: W D Caffrey Jr., MD;  Location: [facility];  Service: Orthopedics;  Laterality: Right;  . Chondroplasty  10/19/2012    Procedure: CHONDROPLASTY;  Surgeon: W D Caffrey Jr., MD;  Location: [facility];  Service: Orthopedics;  Laterality: Right;  . Knee arthroscopy Right 02/01/2013    Procedure: RIGHT KNEE ARTHROSCOPY WITH MEDIAL MENISCECTOMY, DEBRIDEMENT CHONDROMALACIA PATELLA;  Surgeon: W D Caffrey Jr., MD;  Location: [facility];  Service: Orthopedics;  Laterality: Right;  RIGHT KNEE ARTHROSCOPY WITH MEDIAL MENISCECTOMY, DEBRIDEMENT CHONDROMALACIA PATELLA  . Cholecystectomy  04/21/2011    Family History  Problem Relation Age of Onset  . Lung cancer Father 56    dies age 56  . Emphysema Father   . COPD Brother 50    died age 50  . Stroke Mother    Social History:  reports that she has never smoked. She has never used smokeless tobacco. She reports that  drinks alcohol. She reports that she does not use illicit drugs.  Allergies:  Allergies  Allergen Reactions  . Levaquin (Levofloxacin) Other (See Comments)    MUSCLE PAIN  . Nickel Rash     (Not in a hospital admission)  No results found for this or any previous visit (from the past 48 hour(s)). No results found.  Review of Systems  Constitutional: Negative.   HENT: Negative for hearing loss, ear pain and tinnitus.   Eyes: Negative for blurred vision and double vision.  Respiratory: Negative.   Cardiovascular: Negative.   Gastrointestinal: Negative for nausea, vomiting and abdominal pain.  Genitourinary: Positive for hematuria.  Musculoskeletal: Positive for joint pain.  Skin: Negative.   Neurological: Positive for headaches. Negative for dizziness, tingling, sensory change and loss of consciousness.  Endo/Heme/Allergies: Does not bruise/bleed easily.  Psychiatric/Behavioral: Negative for   depression, suicidal ideas and substance abuse.    There were no vitals taken for this visit. Physical Exam  Constitutional: She is oriented to person, place, and time. She appears well-developed and well-nourished. No distress.  HENT:  Head: Normocephalic and atraumatic.  Nose: Nose normal.  Eyes: Conjunctivae and EOM are normal. Pupils are equal, round, and reactive to light.  Neck: Normal range of motion. Neck supple.  Cardiovascular: Normal rate and regular rhythm.   Respiratory: Effort normal and breath sounds normal. No respiratory distress.   GI: Bowel sounds are normal.  Musculoskeletal:       Left knee: She exhibits decreased range of motion and swelling. Tenderness found. Medial joint line tenderness noted.  Neurological: She is alert and oriented to person, place, and time. No cranial nerve deficit.  Skin: Skin is warm and dry.  Psychiatric: She has a normal mood and affect. Her behavior is normal.     Assessment/Plan Left knee medial meniscus tear, chondromalacia  Discussed risks and benefits of left knee arthroscopy debridement, medial menisectomy possible microfracture patient wishes to proceed.  This will be done outpatient procedure at [facility] [facility].  1 hour, general possible knee block.  She has used Norco post op previously.    [name] [name] 03/14/2013, 1:20 PM    

## 2013-03-15 ENCOUNTER — Ambulatory Visit (HOSPITAL_BASED_OUTPATIENT_CLINIC_OR_DEPARTMENT_OTHER): Payer: 59 | Admitting: Anesthesiology

## 2013-03-15 ENCOUNTER — Encounter (HOSPITAL_BASED_OUTPATIENT_CLINIC_OR_DEPARTMENT_OTHER)
Admission: RE | Disposition: A | Discharge status: Home / Self Care | Payer: Self-pay | Source: Ambulatory Visit | Attending: Orthopedic Surgery

## 2013-03-15 ENCOUNTER — Ambulatory Visit (HOSPITAL_BASED_OUTPATIENT_CLINIC_OR_DEPARTMENT_OTHER)
Admission: RE | Admit: 2013-03-15 | Discharge: 2013-03-15 | Disposition: A | Discharge status: Home / Self Care | Payer: 59 | Source: Ambulatory Visit | Attending: Orthopedic Surgery | Admitting: Orthopedic Surgery

## 2013-03-15 ENCOUNTER — Encounter (HOSPITAL_BASED_OUTPATIENT_CLINIC_OR_DEPARTMENT_OTHER): Payer: Self-pay | Admitting: Anesthesiology

## 2013-03-15 ENCOUNTER — Encounter (HOSPITAL_BASED_OUTPATIENT_CLINIC_OR_DEPARTMENT_OTHER): Payer: Self-pay | Admitting: Certified Registered Nurse Anesthetist

## 2013-03-15 DIAGNOSIS — M1710 Unilateral primary osteoarthritis, unspecified knee: Secondary | ICD-10-CM | POA: Insufficient documentation

## 2013-03-15 DIAGNOSIS — IMO0002 Reserved for concepts with insufficient information to code with codable children: Secondary | ICD-10-CM | POA: Insufficient documentation

## 2013-03-15 DIAGNOSIS — K219 Gastro-esophageal reflux disease without esophagitis: Secondary | ICD-10-CM | POA: Insufficient documentation

## 2013-03-15 DIAGNOSIS — J449 Chronic obstructive pulmonary disease, unspecified: Secondary | ICD-10-CM | POA: Insufficient documentation

## 2013-03-15 DIAGNOSIS — F411 Generalized anxiety disorder: Secondary | ICD-10-CM | POA: Insufficient documentation

## 2013-03-15 DIAGNOSIS — Z883 Allergy status to other anti-infective agents status: Secondary | ICD-10-CM | POA: Insufficient documentation

## 2013-03-15 DIAGNOSIS — X58XXXA Exposure to other specified factors, initial encounter: Secondary | ICD-10-CM | POA: Insufficient documentation

## 2013-03-15 DIAGNOSIS — H409 Unspecified glaucoma: Secondary | ICD-10-CM | POA: Insufficient documentation

## 2013-03-15 DIAGNOSIS — J4489 Other specified chronic obstructive pulmonary disease: Secondary | ICD-10-CM | POA: Insufficient documentation

## 2013-03-15 DIAGNOSIS — F3289 Other specified depressive episodes: Secondary | ICD-10-CM | POA: Insufficient documentation

## 2013-03-15 DIAGNOSIS — M2240 Chondromalacia patellae, unspecified knee: Secondary | ICD-10-CM | POA: Insufficient documentation

## 2013-03-15 DIAGNOSIS — Z9109 Other allergy status, other than to drugs and biological substances: Secondary | ICD-10-CM | POA: Insufficient documentation

## 2013-03-15 DIAGNOSIS — F329 Major depressive disorder, single episode, unspecified: Secondary | ICD-10-CM | POA: Insufficient documentation

## 2013-03-15 HISTORY — DX: Other specified personal risk factors, not elsewhere classified: Z91.89

## 2013-03-15 HISTORY — DX: Personal history of other diseases of the digestive system: Z87.19

## 2013-03-15 HISTORY — DX: Other allergy status, other than to drugs and biological substances: Z91.09

## 2013-03-15 HISTORY — DX: Chondromalacia patellae, left knee: M22.42

## 2013-03-15 HISTORY — DX: Headache: R51

## 2013-03-15 HISTORY — DX: Major depressive disorder, single episode, unspecified: F32.9

## 2013-03-15 HISTORY — DX: Headache with orthostatic component, not elsewhere classified: R51.0

## 2013-03-15 HISTORY — DX: Depression, unspecified: F32.A

## 2013-03-15 HISTORY — DX: Personal history of other diseases of the nervous system and sense organs: Z86.69

## 2013-03-15 HISTORY — DX: Anxiety disorder, unspecified: F41.9

## 2013-03-15 HISTORY — DX: Unspecified osteoarthritis, unspecified site: M19.90

## 2013-03-15 HISTORY — PX: KNEE ARTHROSCOPY: SHX127

## 2013-03-15 HISTORY — DX: Dental restoration status: Z98.811

## 2013-03-15 LAB — POCT HEMOGLOBIN-HEMACUE: Hemoglobin: 12.1 g/dL (ref 12.0–15.0)

## 2013-03-15 SURGERY — ARTHROSCOPY, KNEE
Anesthesia: General | Site: Knee | Laterality: Left | Wound class: Clean

## 2013-03-15 MED ORDER — MORPHINE SULFATE 4 MG/ML IJ SOLN
INTRAMUSCULAR | Status: DC | PRN
  Administered 2013-03-15: 10:00:00 via INTRA_ARTICULAR

## 2013-03-15 MED ORDER — CEFAZOLIN SODIUM-DEXTROSE 2-3 GM-% IV SOLR
2.0000 g | INTRAVENOUS | Status: AC
  Administered 2013-03-15: 2 g via INTRAVENOUS

## 2013-03-15 MED ORDER — SODIUM CHLORIDE 0.9 % IR SOLN
Status: DC | PRN
  Administered 2013-03-15: 3000 mL

## 2013-03-15 MED ORDER — METOCLOPRAMIDE HCL 5 MG/ML IJ SOLN: 5.0000 mg | Freq: Three times a day (TID) | INTRAMUSCULAR | Status: DC | PRN

## 2013-03-15 MED ORDER — MIDAZOLAM HCL 5 MG/5ML IJ SOLN
INTRAMUSCULAR | Status: DC | PRN
  Administered 2013-03-15: 1 mg via INTRAVENOUS

## 2013-03-15 MED ORDER — SODIUM CHLORIDE 0.9 % IV SOLN: INTRAVENOUS | Status: DC

## 2013-03-15 MED ORDER — ACETAMINOPHEN 10 MG/ML IV SOLN
1000.0000 mg | Freq: Once | INTRAVENOUS | Status: AC
  Administered 2013-03-15: 1000 mg via INTRAVENOUS

## 2013-03-15 MED ORDER — METHOCARBAMOL 500 MG PO TABS: 500.0000 mg | ORAL_TABLET | Freq: Four times a day (QID) | ORAL | Status: DC | PRN

## 2013-03-15 MED ORDER — PROPOFOL INFUSION 10 MG/ML OPTIME
INTRAVENOUS | Status: DC | PRN
  Administered 2013-03-15: 100 ug/kg/min via INTRAVENOUS

## 2013-03-15 MED ORDER — DOCUSATE SODIUM 100 MG PO CAPS: 100.0000 mg | ORAL_CAPSULE | Freq: Two times a day (BID) | ORAL | Status: DC

## 2013-03-15 MED ORDER — CHLORHEXIDINE GLUCONATE 4 % EX LIQD: 60.0000 mL | Freq: Once | CUTANEOUS | Status: DC

## 2013-03-15 MED ORDER — METHOCARBAMOL 100 MG/ML IJ SOLN: 500.0000 mg | Freq: Four times a day (QID) | INTRAVENOUS | Status: DC | PRN

## 2013-03-15 MED ORDER — ONDANSETRON HCL 4 MG PO TABS: 4.0000 mg | ORAL_TABLET | Freq: Four times a day (QID) | ORAL | Status: DC | PRN

## 2013-03-15 MED ORDER — LIDOCAINE HCL (CARDIAC) 20 MG/ML IV SOLN
INTRAVENOUS | Status: DC | PRN
  Administered 2013-03-15: 60 mg via INTRAVENOUS

## 2013-03-15 MED ORDER — PROPOFOL 10 MG/ML IV BOLUS
INTRAVENOUS | Status: DC | PRN
  Administered 2013-03-15: 150 mg via INTRAVENOUS

## 2013-03-15 MED ORDER — MIDAZOLAM HCL 2 MG/ML PO SYRP: 12.0000 mg | ORAL_SOLUTION | Freq: Once | ORAL | Status: DC | PRN

## 2013-03-15 MED ORDER — OXYCODONE HCL 5 MG PO TABS
5.0000 mg | ORAL_TABLET | Freq: Once | ORAL | Status: AC | PRN
  Administered 2013-03-15: 5 mg via ORAL

## 2013-03-15 MED ORDER — HYDROMORPHONE HCL PF 1 MG/ML IJ SOLN: 0.5000 mg | INTRAMUSCULAR | Status: DC | PRN

## 2013-03-15 MED ORDER — ONDANSETRON HCL 4 MG/2ML IJ SOLN: 4.0000 mg | Freq: Four times a day (QID) | INTRAMUSCULAR | Status: DC | PRN

## 2013-03-15 MED ORDER — METOCLOPRAMIDE HCL 5 MG PO TABS: 5.0000 mg | ORAL_TABLET | Freq: Three times a day (TID) | ORAL | Status: DC | PRN

## 2013-03-15 MED ORDER — MIDAZOLAM HCL 2 MG/2ML IJ SOLN: 1.0000 mg | INTRAMUSCULAR | Status: DC | PRN

## 2013-03-15 MED ORDER — KETOROLAC TROMETHAMINE 30 MG/ML IJ SOLN
INTRAMUSCULAR | Status: DC | PRN
  Administered 2013-03-15: 30 mg via INTRAVENOUS

## 2013-03-15 MED ORDER — HYDROCODONE-ACETAMINOPHEN 5-325 MG PO TABS: 1.0000 | ORAL_TABLET | ORAL | Status: DC | PRN

## 2013-03-15 MED ORDER — DEXAMETHASONE SODIUM PHOSPHATE 4 MG/ML IJ SOLN
INTRAMUSCULAR | Status: DC | PRN
  Administered 2013-03-15: 10 mg via INTRAVENOUS

## 2013-03-15 MED ORDER — FENTANYL CITRATE 0.05 MG/ML IJ SOLN: 50.0000 ug | INTRAMUSCULAR | Status: DC | PRN

## 2013-03-15 MED ORDER — ONDANSETRON HCL 4 MG/2ML IJ SOLN
INTRAMUSCULAR | Status: DC | PRN
  Administered 2013-03-15: 4 mg via INTRAVENOUS

## 2013-03-15 MED ORDER — FENTANYL CITRATE 0.05 MG/ML IJ SOLN
INTRAMUSCULAR | Status: DC | PRN
  Administered 2013-03-15: 50 ug via INTRAVENOUS

## 2013-03-15 MED ORDER — LACTATED RINGERS IV SOLN
INTRAVENOUS | Status: DC
  Administered 2013-03-15 (×2): via INTRAVENOUS

## 2013-03-15 MED ORDER — HYDROMORPHONE HCL PF 1 MG/ML IJ SOLN: 0.2500 mg | INTRAMUSCULAR | Status: DC | PRN

## 2013-03-15 MED ORDER — OXYCODONE HCL 5 MG/5ML PO SOLN: 5.0000 mg | Freq: Once | ORAL | Status: AC | PRN

## 2013-03-15 SURGICAL SUPPLY — 44 items
BANDAGE ELASTIC 6 VELCRO ST LF (GAUZE/BANDAGES/DRESSINGS) ×2 IMPLANT
BANDAGE ESMARK 6X9 LF (GAUZE/BANDAGES/DRESSINGS) IMPLANT
BANDAGE GAUZE ELAST BULKY 4 IN (GAUZE/BANDAGES/DRESSINGS) ×2 IMPLANT
BLADE 4.2CUDA (BLADE) ×2 IMPLANT
BLADE CUDA 5.5 (BLADE) IMPLANT
BLADE CUDA GRT WHITE 3.5 (BLADE) ×2 IMPLANT
BLADE CUDA SHAVER 3.5 (BLADE) IMPLANT
BLADE CUTTER MENIS 5.5 (BLADE) IMPLANT
BLADE GREAT WHITE 4.2 (BLADE) IMPLANT
BNDG ESMARK 6X9 LF (GAUZE/BANDAGES/DRESSINGS)
BRUSH SCRUB EZ PLAIN DRY (MISCELLANEOUS) IMPLANT
CANISTER OMNI JUG 16 LITER (MISCELLANEOUS) ×2 IMPLANT
CANISTER SUCTION 2500CC (MISCELLANEOUS) ×2 IMPLANT
CUTTER MENISCUS  4.2MM (BLADE)
CUTTER MENISCUS 4.2MM (BLADE) IMPLANT
DRAPE ARTHROSCOPY W/POUCH 114 (DRAPES) ×2 IMPLANT
DRSG EMULSION OIL 3X3 NADH (GAUZE/BANDAGES/DRESSINGS) ×2 IMPLANT
DURAPREP 26ML APPLICATOR (WOUND CARE) ×2 IMPLANT
GLOVE BIO SURGEON STRL SZ 6 (GLOVE) ×2 IMPLANT
GLOVE BIO SURGEON STRL SZ7 (GLOVE) ×2 IMPLANT
GLOVE BIOGEL M STRL SZ7.5 (GLOVE) ×2 IMPLANT
GLOVE BIOGEL PI IND STRL 7.0 (GLOVE) ×1 IMPLANT
GLOVE BIOGEL PI IND STRL 8 (GLOVE) ×2 IMPLANT
GLOVE BIOGEL PI INDICATOR 7.0 (GLOVE) ×1
GLOVE BIOGEL PI INDICATOR 8 (GLOVE) ×2
GLOVE SURG ORTHO 8.0 STRL STRW (GLOVE) ×2 IMPLANT
GOWN PREVENTION PLUS XLARGE (GOWN DISPOSABLE) IMPLANT
GOWN PREVENTION PLUS XXLARGE (GOWN DISPOSABLE) ×6 IMPLANT
HOLDER KNEE FOAM BLUE (MISCELLANEOUS) ×2 IMPLANT
KNEE WRAP E Z 3 GEL PACK (MISCELLANEOUS) ×2 IMPLANT
NDL SAFETY ECLIPSE 18X1.5 (NEEDLE) IMPLANT
NEEDLE HYPO 18GX1.5 SHARP (NEEDLE)
PACK ARTHROSCOPY DSU (CUSTOM PROCEDURE TRAY) ×2 IMPLANT
PACK BASIN DAY SURGERY FS (CUSTOM PROCEDURE TRAY) ×2 IMPLANT
SET ARTHROSCOPY TUBING (MISCELLANEOUS) ×1
SET ARTHROSCOPY TUBING LN (MISCELLANEOUS) ×1 IMPLANT
SPONGE GAUZE 4X4 12PLY (GAUZE/BANDAGES/DRESSINGS) ×2 IMPLANT
SUT ETHILON 4 0 PS 2 18 (SUTURE) ×2 IMPLANT
SYR 5ML LL (SYRINGE) IMPLANT
TOWEL OR 17X24 6PK STRL BLUE (TOWEL DISPOSABLE) ×2 IMPLANT
WAND 3.0 CAPSURE 30 DEG W/CORD (SURGICAL WAND) IMPLANT
WAND 30 DEG SABER W/CORD (SURGICAL WAND) IMPLANT
WAND STAR VAC 90 (SURGICAL WAND) IMPLANT
WATER STERILE IRR 1000ML POUR (IV SOLUTION) ×2 IMPLANT

## 2013-03-15 NOTE — H&P (View-Only) (Signed)
[name] [name] is an 57 y.o. female.   Chief Complaint: left medial meniscus tear HPI: pt is a 57yo female underwent right knee arthroscopy x2 relatively close together presented to outpatient office with left knee complaints MRI revealed medial meniscus tear.  Patient is a [employer] and would need [facility] performed at [facility].  Past Medical History  Diagnosis Date  . Bronchiectasis   . COPD (chronic obstructive pulmonary disease)   . GERD (gastroesophageal reflux disease)   . Bronchitis     chronic  . PONV (postoperative nausea and vomiting)   . Hx of migraines   . H/O hiatal hernia     "sliding"  . Osteoarthritis     knees  . Positional headache since 1997    if lies on left side  . Depression   . Anxiety   . 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 Dental crown present     upper  . Abrasion of wrist, left 03/12/2013  . Environmental allergies     receives allergy shots  . Glaucoma high risk     is monitored every 6 mos. - no current med.    Past Surgical History  Procedure Laterality Date  . Tonsillectomy and adenoidectomy      age 89  . Tubal ligation  1986  . Colonoscopy    . Wisdom tooth extraction    . Knee arthroscopy  10/19/2012    Procedure: ARTHROSCOPY KNEE;  Surgeon: Thera Flake., MD;  Location: [facility];  Service: Orthopedics;  Laterality: Right;  . Knee arthroscopy with lateral menisectomy  10/19/2012    Procedure: KNEE ARTHROSCOPY WITH LATERAL MENISECTOMY;  Surgeon: Thera Flake., MD;  Location: [facility];  Service: Orthopedics;  Laterality: Right;  . Chondroplasty  10/19/2012    Procedure: CHONDROPLASTY;  Surgeon: Thera Flake., MD;  Location: [facility];  Service: Orthopedics;  Laterality: Right;  . Knee arthroscopy Right 02/01/2013    Procedure: RIGHT KNEE ARTHROSCOPY WITH MEDIAL MENISCECTOMY, DEBRIDEMENT CHONDROMALACIA PATELLA;  Surgeon: Thera Flake., MD;  Location: [facility];  Service: Orthopedics;  Laterality: Right;  RIGHT KNEE ARTHROSCOPY WITH MEDIAL MENISCECTOMY, DEBRIDEMENT CHONDROMALACIA PATELLA  . Cholecystectomy  04/21/2011    Family History  Problem Relation Age of Onset  . Lung cancer Father 10    dies age 46  . Emphysema Father   . COPD Brother 43    died age 56  . Stroke Mother    Social History:  reports that she has never smoked. She has never used smokeless tobacco. She reports that  drinks alcohol. She reports that she does not use illicit drugs.  Allergies:  Allergies  Allergen Reactions  . Levaquin (Levofloxacin) Other (See Comments)    MUSCLE PAIN  . Nickel Rash     (Not in a hospital admission)  No results found for this or any previous visit (from the past 48 hour(s)). No results found.  Review of Systems  Constitutional: Negative.   HENT: Negative for hearing loss, ear pain and tinnitus.   Eyes: Negative for blurred vision and double vision.  Respiratory: Negative.   Cardiovascular: Negative.   Gastrointestinal: Negative for nausea, vomiting and abdominal pain.  Genitourinary: Positive for hematuria.  Musculoskeletal: Positive for joint pain.  Skin: Negative.   Neurological: Positive for headaches. Negative for dizziness, tingling, sensory change and loss of consciousness.  Endo/Heme/Allergies: Does not bruise/bleed easily.  Psychiatric/Behavioral: Negative for  depression, suicidal ideas and substance abuse.    There were no vitals taken for this visit. Physical Exam  Constitutional: She is oriented to person, place, and time. She appears well-developed and well-nourished. No distress.  HENT:  Head: Normocephalic and atraumatic.  Nose: Nose normal.  Eyes: Conjunctivae and EOM are normal. Pupils are equal, round, and reactive to light.  Neck: Normal range of motion. Neck supple.  Cardiovascular: Normal rate and regular rhythm.   Respiratory: Effort normal and breath sounds normal. No respiratory distress.   GI: Bowel sounds are normal.  Musculoskeletal:       Left knee: She exhibits decreased range of motion and swelling. Tenderness found. Medial joint line tenderness noted.  Neurological: She is alert and oriented to person, place, and time. No cranial nerve deficit.  Skin: Skin is warm and dry.  Psychiatric: She has a normal mood and affect. Her behavior is normal.     Assessment/Plan Left knee medial meniscus tear, chondromalacia  Discussed risks and benefits of left knee arthroscopy debridement, medial menisectomy possible microfracture patient wishes to proceed.  This will be done outpatient procedure at [facility].  1 hour, general possible knee block.  She has used [employer] post op previously.    [name] 03/14/2013, 1:20 PM

## 2013-03-15 NOTE — Anesthesia Preprocedure Evaluation (Addendum)
Anesthesia Evaluation  Patient identified by MRN, date of birth, ID band Patient awake    Reviewed: Allergy & Precautions, H&P , NPO status , Patient's Chart, lab work & pertinent test results  History of Anesthesia Complications (+) PONV  Airway Mallampati: II TM Distance: >3 FB Neck ROM: Full    Dental no notable dental hx. (+) Teeth Intact and Dental Advisory Given   Pulmonary shortness of breath, COPD COPD inhaler,  breath sounds clear to auscultation  Pulmonary exam normal       Cardiovascular negative cardio ROS  Rhythm:Regular Rate:Normal     Neuro/Psych  Headaches, PSYCHIATRIC DISORDERS    GI/Hepatic Neg liver ROS, hiatal hernia, GERD-  Medicated and Controlled,  Endo/Other  negative endocrine ROS  Renal/GU negative Renal ROS  negative genitourinary   Musculoskeletal   Abdominal   Peds  Hematology negative hematology ROS (+)   Anesthesia Other Findings   Reproductive/Obstetrics negative OB ROS                          Anesthesia Physical Anesthesia Plan  ASA: II  Anesthesia Plan: General   Post-op Pain Management:    Induction: Intravenous  Airway Management Planned: LMA  Additional Equipment:   Intra-op Plan:   Post-operative Plan: Extubation in OR  Informed Consent: I have reviewed the patients History and Physical, chart, labs and discussed the procedure including the risks, benefits and alternatives for the proposed anesthesia with the patient or authorized representative who has indicated his/her understanding and acceptance.   Dental advisory given  Plan Discussed with: CRNA and Surgeon  Anesthesia Plan Comments:         Anesthesia Quick Evaluation

## 2013-03-15 NOTE — Anesthesia Procedure Notes (Signed)
Procedure Name: LMA Insertion Date/Time: 03/15/2013 9:14 AM Performed by: Jennfier Abdulla D Pre-anesthesia Checklist: Patient identified, Emergency Drugs available, Suction available and Patient being monitored Patient Re-evaluated:Patient Re-evaluated prior to inductionOxygen Delivery Method: Circle System Utilized Preoxygenation: Pre-oxygenation with 100% oxygen Intubation Type: IV induction Ventilation: Mask ventilation without difficulty LMA: LMA inserted LMA Size: 4.0 Number of attempts: 1 Airway Equipment and Method: bite block Placement Confirmation: positive ETCO2 Tube secured with: Tape Dental Injury: Teeth and Oropharynx as per pre-operative assessment

## 2013-03-15 NOTE — Interval H&P Note (Signed)
History and Physical Interval Note:  03/15/2013 9:02 AM  [name] [name]  has presented today for surgery, with the diagnosis of LEFT KNEE CHONDROMALACIA PATELLA, CYST ENISCUS/DISCOID/DERANGEMENT  The various methods of treatment have been discussed with the patient and family. After consideration of risks, benefits and other options for treatment, the patient has consented to  Procedure(s): LEFT KNEE ARTHROSCOPY WITH DEBRIDEMENT/SHAVING CHONDROPLASTY WITH MEDIAL MENISECTOMY (Left) as a surgical intervention .  The patient's history has been reviewed, patient examined, no change in status, stable for surgery.  I have reviewed the patient's chart and labs.  Questions were answered to the patient's satisfaction.     Ginia Rudell JR,W D

## 2013-03-15 NOTE — Anesthesia Postprocedure Evaluation (Signed)
  Anesthesia Post-op Note  Patient: [name] [name]  Procedure(s) Performed: Procedure(s): LEFT KNEE ARTHROSCOPY WITH DEBRIDEMENT/SHAVING CHONDROPLASTY WITH MEDIAL MENISECTOMY (Left)  Patient Location: PACU  Anesthesia Type:General  Level of Consciousness: awake, alert  and oriented  Airway and Oxygen Therapy: Patient Spontanous Breathing  Post-op Pain: mild  Post-op Assessment: Post-op Vital signs reviewed, Patient's Cardiovascular Status Stable, Respiratory Function Stable, Patent Airway and No signs of Nausea or vomiting  Post-op Vital Signs: Reviewed and stable  Complications: No apparent anesthesia complications

## 2013-03-15 NOTE — Transfer of Care (Signed)
Immediate Anesthesia Transfer of Care Note  Patient: [name] [name]  Procedure(s) Performed: Procedure(s): LEFT KNEE ARTHROSCOPY WITH DEBRIDEMENT/SHAVING CHONDROPLASTY WITH MEDIAL MENISECTOMY (Left)  Patient Location: PACU  Anesthesia Type:General  Level of Consciousness: awake, alert , oriented and patient cooperative  Airway & Oxygen Therapy: Patient Spontanous Breathing and Patient connected to face mask oxygen  Post-op Assessment: Report given to PACU RN and Post -op Vital signs reviewed and stable  Post vital signs: Reviewed and stable  Complications: No apparent anesthesia complications

## 2013-03-18 ENCOUNTER — Encounter (HOSPITAL_BASED_OUTPATIENT_CLINIC_OR_DEPARTMENT_OTHER): Payer: Self-pay | Admitting: Orthopedic Surgery

## 2013-03-18 ENCOUNTER — Encounter: Payer: Self-pay | Admitting: Internal Medicine

## 2013-03-18 NOTE — Op Note (Signed)
NAME:  [name] [name], [name] [name]                  ACCOUNT NO.:  MEDICAL RECORD NO.:  [id number]  LOCATION:                                 FACILITY:  PHYSICIAN:  Dyke Brackett, M.D.    DATE OF BIRTH:  30-Mar-1956  DATE OF PROCEDURE:  03/15/2013 DATE OF DISCHARGE:                              OPERATIVE REPORT   INDICATIONS:  Symptomatic left knee medial meniscus tear.  Thought to be amenable to outpatient surgery.  PREOPERATIVE DIAGNOSIS: 1. Complex tear with large flap, posterior horn medial meniscus, left     knee. 2. Grade 3 chondromalacia, patellofemoral joint. 3. Grade 3 chondromalacia, medial compartment.  POSTOPERATIVE DIAGNOSIS: 1. Complex tear with large flap, posterior horn medial meniscus, left     knee. 2. Grade 3 chondromalacia, patella femoral joint. 3. Grade 3 chondromalacia, medial compartment.  OPERATION: 1. Partial meniscectomy (posterior 40%.) 2. Debridement of chondroplasty patellofemoral medial compartment.  SURGEON:  Dyke Brackett, M.D.  General with local supplementation.  DESCRIPTION OF PROCEDURE:  She was arthroscoped with an intermediate inferolateral portal.  She had relatively well defined, changes of grade 3 nature of chondromalacia.  These were early grade 3 changes.  Some involvement of the trochlear groove was noted but the anterior aspects of the femur laterally and medially looked reasonably good.  The lateral compartment was essentially normal.  Lateral meniscus and ACL are normal.  Complex tear of the posterior meniscus with a flap of meniscus flipped into the medial gutter.  We debrided this back to the capsule, resected about 40% meniscus substance.  Some grade 3 changes noted certainly in the posterior portion of the tibia and most of the femoral condyle, most of the weight bearing surfaces, but these were grade 3, but to my recollection not quite as advanced as in the opposite right knee which had been previously operated.   Partial meniscectomy was carried out.  Debridement chondroplasty patellofemoral joint, medial compartment of knee drained free of fluid.  Portal was closed with nylon, infiltrated the joint and portals with 30 mg 0.5% Marcaine with epinephrine with 4 mg of morphine, taken to recovery room in stable condition.     Dyke Brackett, M.D.     WDC/MEDQ  D:  03/15/2013  T:  03/15/2013  Job:  [id number]

## 2013-03-19 ENCOUNTER — Ambulatory Visit (INDEPENDENT_AMBULATORY_CARE_PROVIDER_SITE_OTHER): Payer: 59

## 2013-03-19 DIAGNOSIS — J309 Allergic rhinitis, unspecified: Secondary | ICD-10-CM

## 2013-03-21 ENCOUNTER — Ambulatory Visit (INDEPENDENT_AMBULATORY_CARE_PROVIDER_SITE_OTHER): Payer: 59

## 2013-03-21 DIAGNOSIS — J309 Allergic rhinitis, unspecified: Secondary | ICD-10-CM

## 2013-03-26 ENCOUNTER — Ambulatory Visit (INDEPENDENT_AMBULATORY_CARE_PROVIDER_SITE_OTHER): Payer: 59

## 2013-03-26 DIAGNOSIS — J309 Allergic rhinitis, unspecified: Secondary | ICD-10-CM

## 2013-03-28 ENCOUNTER — Ambulatory Visit: Payer: 59

## 2013-04-01 ENCOUNTER — Ambulatory Visit (INDEPENDENT_AMBULATORY_CARE_PROVIDER_SITE_OTHER): Payer: 59

## 2013-04-01 DIAGNOSIS — J309 Allergic rhinitis, unspecified: Secondary | ICD-10-CM

## 2013-04-04 ENCOUNTER — Ambulatory Visit: Payer: 59

## 2013-04-10 ENCOUNTER — Ambulatory Visit (INDEPENDENT_AMBULATORY_CARE_PROVIDER_SITE_OTHER): Payer: 59

## 2013-04-10 DIAGNOSIS — J309 Allergic rhinitis, unspecified: Secondary | ICD-10-CM

## 2013-04-16 ENCOUNTER — Ambulatory Visit (INDEPENDENT_AMBULATORY_CARE_PROVIDER_SITE_OTHER): Payer: 59

## 2013-04-16 DIAGNOSIS — J309 Allergic rhinitis, unspecified: Secondary | ICD-10-CM

## 2013-04-18 ENCOUNTER — Ambulatory Visit (INDEPENDENT_AMBULATORY_CARE_PROVIDER_SITE_OTHER): Payer: 59

## 2013-04-18 DIAGNOSIS — J309 Allergic rhinitis, unspecified: Secondary | ICD-10-CM

## 2013-04-22 ENCOUNTER — Ambulatory Visit (INDEPENDENT_AMBULATORY_CARE_PROVIDER_SITE_OTHER): Payer: 59

## 2013-04-22 DIAGNOSIS — J309 Allergic rhinitis, unspecified: Secondary | ICD-10-CM

## 2013-04-24 ENCOUNTER — Ambulatory Visit (INDEPENDENT_AMBULATORY_CARE_PROVIDER_SITE_OTHER): Payer: 59

## 2013-04-24 DIAGNOSIS — J309 Allergic rhinitis, unspecified: Secondary | ICD-10-CM

## 2013-04-25 ENCOUNTER — Ambulatory Visit: Payer: 59

## 2013-04-29 ENCOUNTER — Encounter: Payer: Self-pay | Admitting: Internal Medicine

## 2013-05-02 ENCOUNTER — Ambulatory Visit (INDEPENDENT_AMBULATORY_CARE_PROVIDER_SITE_OTHER): Payer: 59

## 2013-05-02 DIAGNOSIS — J309 Allergic rhinitis, unspecified: Secondary | ICD-10-CM

## 2013-05-07 ENCOUNTER — Ambulatory Visit: Payer: 59

## 2013-05-17 ENCOUNTER — Ambulatory Visit (INDEPENDENT_AMBULATORY_CARE_PROVIDER_SITE_OTHER): Payer: 59

## 2013-05-17 DIAGNOSIS — J309 Allergic rhinitis, unspecified: Secondary | ICD-10-CM

## 2013-05-22 ENCOUNTER — Ambulatory Visit: Payer: 59

## 2013-05-22 ENCOUNTER — Ambulatory Visit (INDEPENDENT_AMBULATORY_CARE_PROVIDER_SITE_OTHER): Payer: 59

## 2013-05-22 DIAGNOSIS — J309 Allergic rhinitis, unspecified: Secondary | ICD-10-CM

## 2013-05-23 ENCOUNTER — Other Ambulatory Visit: Payer: Self-pay

## 2013-05-23 ENCOUNTER — Ambulatory Visit: Payer: 59

## 2013-05-23 DIAGNOSIS — Z1231 Encounter for screening mammogram for malignant neoplasm of breast: Secondary | ICD-10-CM

## 2013-05-24 ENCOUNTER — Ambulatory Visit: Payer: 59

## 2013-05-27 ENCOUNTER — Telehealth: Payer: Self-pay | Admitting: Internal Medicine

## 2013-05-27 ENCOUNTER — Ambulatory Visit (INDEPENDENT_AMBULATORY_CARE_PROVIDER_SITE_OTHER): Payer: 59

## 2013-05-27 DIAGNOSIS — J309 Allergic rhinitis, unspecified: Secondary | ICD-10-CM

## 2013-05-27 NOTE — Telephone Encounter (Signed)
LMTCB

## 2013-05-28 NOTE — Telephone Encounter (Signed)
Pt returned triage's call.  Holly D Pryor ° °

## 2013-05-28 NOTE — Telephone Encounter (Signed)
lmomtcb for pt 

## 2013-05-28 NOTE — Telephone Encounter (Signed)
[email or web address].Jennifer Castillo, CMA  

## 2013-05-29 NOTE — Telephone Encounter (Signed)
Pt has appt with Dr Maple Hudson on 07-16-13 for yearly f/u.  Her current allergy vaccine that we are giving her was mixed at [facility] prior to her becoming a pt here.  Vaccine will run out prior to her scheduled appt.  Please advise if vaccine can be refilled prior to ov or it pt needs sooner appt please advise on afternoon opening.

## 2013-05-30 ENCOUNTER — Ambulatory Visit: Payer: 59

## 2013-05-30 NOTE — Telephone Encounter (Signed)
[name] [name]/ Tammy- We need to get from Dr [employer] office in [place], or from [place], the allergy vaccine and skin test record so we know what is in her vaccine. That's if she wants [place] to start making her vaccine. Otherwise she neeeds to contact her original source to get hers refilled.

## 2013-05-30 NOTE — Telephone Encounter (Signed)
I called [facility] and left a message with they're allergy dept. Asking for [name] [name] ST. I gave them the Pulmonary [phone]) and Katie and Dr.Young's name.

## 2013-05-30 NOTE — Telephone Encounter (Signed)
Pt states she returned call from [place]. Requests that she be called back today. [name] [name]

## 2013-05-30 NOTE — Telephone Encounter (Signed)
Spoke with patient-aware that TS has called to get records faxed to [place]; so we may get her vaccine and skin test to make her vaccine up here. Pt will be updated as we hear something.

## 2013-06-03 NOTE — Telephone Encounter (Signed)
[name] at Dr Wynell Balloon' office called to double check on what was needed for our records; she is aware that we need skin test results and current vaccine information. She is faxing to our triage fax to my attention so CY can review and decide on vaccine make up- here or have her go through Dr Wynell Balloon' office while waiting to be seen 07-16-13 by CY. Will forward to CY to advise. Thanks.

## 2013-06-06 NOTE — Telephone Encounter (Signed)
Please advise if this has been done, thank you! 

## 2013-06-11 NOTE — Telephone Encounter (Signed)
[name], can this message be signed?  Thanks!

## 2013-06-11 NOTE — Telephone Encounter (Signed)
I am trying to get in touch with Dr Wynell Balloon office again as I never received any papers on this patient. Thanks.

## 2013-06-12 ENCOUNTER — Ambulatory Visit (INDEPENDENT_AMBULATORY_CARE_PROVIDER_SITE_OTHER): Payer: 59

## 2013-06-12 DIAGNOSIS — J309 Allergic rhinitis, unspecified: Secondary | ICD-10-CM

## 2013-06-13 ENCOUNTER — Ambulatory Visit: Payer: 59

## 2013-06-13 NOTE — Telephone Encounter (Signed)
I received the faxed information needed on patient yesterday-I have placed on CY's cart to review and advise.

## 2013-06-17 ENCOUNTER — Ambulatory Visit (INDEPENDENT_AMBULATORY_CARE_PROVIDER_SITE_OTHER): Payer: 59

## 2013-06-17 DIAGNOSIS — J309 Allergic rhinitis, unspecified: Secondary | ICD-10-CM

## 2013-06-18 NOTE — Telephone Encounter (Signed)
Script adjusting her [facility]/ [place] vaccine for our allergy lab completed.

## 2013-06-18 NOTE — Telephone Encounter (Signed)
Dr. Maple Hudson made up vac. In my absence. Done.

## 2013-06-19 NOTE — Telephone Encounter (Signed)
Please advise [name] if you have this thanks

## 2013-06-21 ENCOUNTER — Ambulatory Visit (INDEPENDENT_AMBULATORY_CARE_PROVIDER_SITE_OTHER): Payer: 59

## 2013-06-21 DIAGNOSIS — J309 Allergic rhinitis, unspecified: Secondary | ICD-10-CM

## 2013-06-26 ENCOUNTER — Ambulatory Visit
Admission: RE | Admit: 2013-06-26 | Discharge: 2013-06-26 | Disposition: A | Discharge status: Home / Self Care | Payer: 59 | Source: Ambulatory Visit

## 2013-06-26 ENCOUNTER — Ambulatory Visit: Payer: 59

## 2013-06-26 DIAGNOSIS — Z1231 Encounter for screening mammogram for malignant neoplasm of breast: Secondary | ICD-10-CM

## 2013-06-27 ENCOUNTER — Other Ambulatory Visit: Payer: Self-pay | Admitting: Internal Medicine

## 2013-07-03 ENCOUNTER — Ambulatory Visit (INDEPENDENT_AMBULATORY_CARE_PROVIDER_SITE_OTHER): Payer: 59

## 2013-07-03 DIAGNOSIS — J309 Allergic rhinitis, unspecified: Secondary | ICD-10-CM

## 2013-07-09 ENCOUNTER — Ambulatory Visit (INDEPENDENT_AMBULATORY_CARE_PROVIDER_SITE_OTHER): Payer: 59

## 2013-07-09 DIAGNOSIS — J309 Allergic rhinitis, unspecified: Secondary | ICD-10-CM

## 2013-07-16 ENCOUNTER — Ambulatory Visit (INDEPENDENT_AMBULATORY_CARE_PROVIDER_SITE_OTHER): Payer: 59

## 2013-07-16 ENCOUNTER — Encounter: Payer: Self-pay | Admitting: Internal Medicine

## 2013-07-16 ENCOUNTER — Ambulatory Visit (INDEPENDENT_AMBULATORY_CARE_PROVIDER_SITE_OTHER): Payer: 59 | Admitting: Internal Medicine

## 2013-07-16 VITALS — BP 142/82 | HR 73 | Ht 62.0 in | Wt 212.6 lb

## 2013-07-16 DIAGNOSIS — J309 Allergic rhinitis, unspecified: Secondary | ICD-10-CM

## 2013-07-16 DIAGNOSIS — J3089 Other allergic rhinitis: Secondary | ICD-10-CM

## 2013-07-16 DIAGNOSIS — J302 Other seasonal allergic rhinitis: Secondary | ICD-10-CM

## 2013-07-16 MED ORDER — AZELASTINE HCL 0.15 % NA SOLN: NASAL | Status: DC

## 2013-07-16 NOTE — Progress Notes (Signed)
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 She is followed here by Dr [name] [name] for Cough/ obstructive lung disease. Dr [name] [name] [facility]; needs to establish here for allergies(has vaccine from [facility])-works at [employer] and more convenient. Past 2 nights has had more drainage and feels like working up a sinus infection(has had biaxan and prednisone-3 rds already this year) She gives a history of recurrent pneumonias. These usually start with sinus infection, then bronchitis which is hard to clear. She has not been diagnosed with asthma. This spring she was on sustained antibiotics and prednisone. She was on allergy vaccine last year, from Dr [name] [name] at [place], administered in [place]. She stopped after a few injections because of generalized itching. Her vaccine has been remixed weaker. She would like to restart, but has been sick this spring too frequently. Her vaccine is made and ready. She works here in town at [employer] and her insurance is through [employer]. She has had no ENT surgery. 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 Family history of allergies and asthma. She feels that she is headed into another episode of sinusitis/bronchitis and would like prescriptions for Biaxin and a prednisone taper to hold. We discussed steroid side effects.  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 She is followed here by Dr [name] [name] for Cough/ obstructive lung disease/ chronic bronchitis.  Patient states doing much better since last visit.  stillc c/o drainage and cough.  Patient states [name] sample helped with drainage.  Also, c/o itching  at the 0.1 dosage of the allergy vaccine and states the .05 dosage did ok.  Has not needed prednisone or Biaxin given at our last visit. No acute exacerbations. Vaccine volume was held at 0.05 mL from her Silver  vial through August. [name]. Allergy Profile 06/22/2012 total IgE 26.2/negative for specific elevations CBC 06/22/2012-WBC 6000 with normal differential Immunoglobulins 06/22/2012- IgG normal 929, IgA normal 153, IgM low 42 (52-322). Discussed with her.  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 She is followed here by Dr [name] [name] for Cough/ obstructive lung disease/ chronic bronchitis. [name].  [name] 1:50 .  Does have PND and cough with light mucus.   Did very well until the month of July and then began having increased nasal congestion. This would not have been the spring pollen season. She got a live chicken in April. Wakes coughing. Using Nasonex and ipratropium nasal sprays with daily Allegra.  ROS-see HPI Constitutional:   No-   weight loss, night sweats, fevers, chills, fatigue, lassitude. HEENT:   No-  headaches, difficulty swallowing, tooth/dental problems, sore throat,       No-  sneezing, itching, ear ache, +nasal congestion, post nasal drip,  CV:  No-   chest pain, orthopnea, PND, swelling in lower extremities, anasarca,  dizziness, palpitations Resp: No-   shortness of breath with exertion or at rest.             + Little  productive cough,  + non-productive cough,  No- coughing up of blood.              No-   change in color of mucus.  No- wheezing.   Skin: No-   rash or lesions. GI:  No-   heartburn, indigestion, abdominal  pain, nausea, vomiting,  GU:  MS:  No-   joint pain or swelling.   Neuro-     nothing unusual Psych:  No- change in mood or affect. No depression or anxiety.  No memory loss.  OBJ- Physical Exam General- Alert, Oriented, Affect-appropriate, Distress- none acute, overweight Skin- rash-none, lesions- none, excoriation- none Lymphadenopathy- none Head- atraumatic            Eyes- Gross vision intact, PERRLA, conjunctivae and secretions clear            Ears-  Hearing, canals-normal            Nose- + turbinate edema, no-Septal dev, mucus, polyps, erosion, perforation             Throat- Mallampati II-III , mucosa clear , drainage- none, tonsils- atrophic Neck- flexible , trachea midline, no stridor , thyroid nl, carotid no bruit Chest - symmetrical excursion , unlabored           Heart/CV- RRR , no murmur , no gallop  , no rub, nl s1 s2                           - JVD- none , edema- none, stasis changes- none, varices- none           Lung- clear to P&A, wheeze- none, + minimal dry cough , dullness-none, rub- none           Chest wall-  Abd-  Br/ Gen/ Rectal- Not done, not indicated Extrem- cyanosis- none, clubbing, none, atrophy- none, strength- nl Neuro- grossly intact to observation

## 2013-07-16 NOTE — Patient Instructions (Addendum)
We can continue allergy vaccine. I will check on your dosing- there may be room to increase the strength one more step.  Ok to continue Nasonex nasal spray daily  Ok to continue using the ipratropium nasal spray if needed  Script so you can add a third type of nose spray- antihistamine  Astepro/ azelastine- if needed for the postnasal drip. This and nasonex can be used right on top of each other if you want to.  Ok to try loratadine/ Claritin once daily, in addition to the [place], if needed

## 2013-07-23 ENCOUNTER — Ambulatory Visit: Payer: 59

## 2013-07-24 NOTE — Assessment & Plan Note (Signed)
Having no problems now with allergy vaccine as she continues at 1:50, given weekly. We discussed options and will give prescription for Astepro nasal spray to use of Claritin is insufficient

## 2013-07-25 ENCOUNTER — Ambulatory Visit (INDEPENDENT_AMBULATORY_CARE_PROVIDER_SITE_OTHER): Payer: 59

## 2013-07-25 DIAGNOSIS — J309 Allergic rhinitis, unspecified: Secondary | ICD-10-CM

## 2013-07-30 ENCOUNTER — Ambulatory Visit: Payer: 59

## 2013-08-13 ENCOUNTER — Ambulatory Visit (INDEPENDENT_AMBULATORY_CARE_PROVIDER_SITE_OTHER): Payer: 59

## 2013-08-13 DIAGNOSIS — J309 Allergic rhinitis, unspecified: Secondary | ICD-10-CM

## 2013-08-19 ENCOUNTER — Ambulatory Visit: Payer: 59

## 2013-08-21 ENCOUNTER — Ambulatory Visit (INDEPENDENT_AMBULATORY_CARE_PROVIDER_SITE_OTHER): Payer: 59

## 2013-08-21 DIAGNOSIS — J309 Allergic rhinitis, unspecified: Secondary | ICD-10-CM

## 2013-08-27 ENCOUNTER — Ambulatory Visit: Payer: 59

## 2013-08-29 ENCOUNTER — Ambulatory Visit (INDEPENDENT_AMBULATORY_CARE_PROVIDER_SITE_OTHER): Payer: 59

## 2013-08-29 DIAGNOSIS — J309 Allergic rhinitis, unspecified: Secondary | ICD-10-CM

## 2013-09-03 ENCOUNTER — Ambulatory Visit: Payer: 59

## 2013-09-18 ENCOUNTER — Other Ambulatory Visit: Payer: Self-pay | Admitting: Internal Medicine

## 2013-09-19 ENCOUNTER — Ambulatory Visit (INDEPENDENT_AMBULATORY_CARE_PROVIDER_SITE_OTHER): Payer: 59

## 2013-09-19 DIAGNOSIS — J309 Allergic rhinitis, unspecified: Secondary | ICD-10-CM

## 2013-09-24 ENCOUNTER — Other Ambulatory Visit: Payer: Self-pay | Admitting: Internal Medicine

## 2013-09-24 MED ORDER — IPRATROPIUM BROMIDE 0.03 % NA SOLN: 2.0000 | Freq: Two times a day (BID) | NASAL | Status: DC

## 2013-09-24 NOTE — Telephone Encounter (Signed)
Electronically sent refill

## 2013-10-02 ENCOUNTER — Ambulatory Visit (INDEPENDENT_AMBULATORY_CARE_PROVIDER_SITE_OTHER): Payer: 59

## 2013-10-02 DIAGNOSIS — J309 Allergic rhinitis, unspecified: Secondary | ICD-10-CM

## 2013-10-08 ENCOUNTER — Ambulatory Visit (INDEPENDENT_AMBULATORY_CARE_PROVIDER_SITE_OTHER): Payer: 59

## 2013-10-08 DIAGNOSIS — J309 Allergic rhinitis, unspecified: Secondary | ICD-10-CM

## 2013-10-15 ENCOUNTER — Ambulatory Visit: Payer: 59 | Admitting: Internal Medicine

## 2013-10-15 ENCOUNTER — Ambulatory Visit (INDEPENDENT_AMBULATORY_CARE_PROVIDER_SITE_OTHER): Payer: 59

## 2013-10-15 DIAGNOSIS — J309 Allergic rhinitis, unspecified: Secondary | ICD-10-CM

## 2013-10-18 ENCOUNTER — Ambulatory Visit: Payer: 59 | Admitting: Internal Medicine

## 2013-10-24 ENCOUNTER — Ambulatory Visit (INDEPENDENT_AMBULATORY_CARE_PROVIDER_SITE_OTHER): Payer: 59

## 2013-10-24 ENCOUNTER — Other Ambulatory Visit (HOSPITAL_COMMUNITY): Payer: Self-pay | Admitting: *Deleted

## 2013-10-24 DIAGNOSIS — E039 Hypothyroidism, unspecified: Secondary | ICD-10-CM

## 2013-10-24 DIAGNOSIS — J309 Allergic rhinitis, unspecified: Secondary | ICD-10-CM

## 2013-10-29 ENCOUNTER — Ambulatory Visit: Payer: 59

## 2013-10-30 ENCOUNTER — Ambulatory Visit (INDEPENDENT_AMBULATORY_CARE_PROVIDER_SITE_OTHER): Payer: 59

## 2013-10-30 ENCOUNTER — Encounter: Payer: Self-pay | Admitting: Obstetrics and Gynecology

## 2013-10-30 ENCOUNTER — Ambulatory Visit (INDEPENDENT_AMBULATORY_CARE_PROVIDER_SITE_OTHER): Payer: 59 | Admitting: Obstetrics and Gynecology

## 2013-10-30 VITALS — BP 140/70 | HR 80 | Ht 60.5 in | Wt 193.0 lb

## 2013-10-30 DIAGNOSIS — Z01419 Encounter for gynecological examination (general) (routine) without abnormal findings: Secondary | ICD-10-CM

## 2013-10-30 DIAGNOSIS — J309 Allergic rhinitis, unspecified: Secondary | ICD-10-CM

## 2013-10-30 DIAGNOSIS — Z0142 Encounter for cervical smear to confirm findings of recent normal smear following initial abnormal smear: Secondary | ICD-10-CM

## 2013-10-30 DIAGNOSIS — N812 Incomplete uterovaginal prolapse: Secondary | ICD-10-CM

## 2013-10-30 DIAGNOSIS — R32 Unspecified urinary incontinence: Secondary | ICD-10-CM

## 2013-10-30 LAB — POCT URINALYSIS DIPSTICK
Bilirubin, UA: NEGATIVE
Glucose, UA: NEGATIVE
Ketones, UA: NEGATIVE
Leukocytes, UA: NEGATIVE
Nitrite, UA: NEGATIVE
Protein, UA: NEGATIVE
Urobilinogen, UA: NEGATIVE
pH, UA: 5

## 2013-10-30 NOTE — Patient Instructions (Addendum)

## 2013-10-30 NOTE — Progress Notes (Signed)
Patient ID: [name] [name], female   DOB: June 03, 1956, 57 y.o.   MRN: [id number] GYNECOLOGY VISIT  PCP:   Madelyn Flavors MD [place], [place])  Referring provider:   HPI: 57 y.o.   Married  Caucasian  female   [phone] with Patient's last menstrual period was 12/12/2005.   here for   AEX.  Has a known rectocele, cystocele and uterine prolapse, which are worsening.  Saw a urologist already.  Was fitted for a pessary, which did not help at all. This was a cube - 4 - 6 year ago.  No physical therapy.  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  Leaks a little bit with cough, sneezing, and laughing.  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  Up once or twice a night to void.  No dysuria. Does perirectal splinting to have bowel movements.  Recent diagnosis of hypothyroidism.  Just started Armour thyroid.  Having a thyroid ultrasound tomorrow.   Patient doing weight loss - lost 27 pounds since mid September.  On Belviq and dietary changes.   Hgb:    PCP Urine:  1+ RBC's - has had this life long.  Has seen urologist in [place].  Cystoscopy, CT scan, IVP - normal.   GYNECOLOGIC HISTORY: Patient's last menstrual period was 12/12/2005. Sexually active:  yes Partner preference: female Contraception: postmenopausal   Menopausal hormone therapy: used Arbone  progesterone cream, but she has not used this recently.  DES exposure:   no Blood transfusions: no   Sexually transmitted diseases:   no GYN Procedures:  no Mammogram:   02/2013 wnl:[facility]              Pap:   2012 wnl History of abnormal pap smear:  no   OB History   Grav Para Term Preterm Abortions TAB SAB Ect Mult Living   4 4 4       4        LIFESTYLE: Exercise:   walking            Tobacco:    no Alcohol:       2 glasses of wine per week Drug use:  no  OTHER HEALTH MAINTENANCE: Tetanus/TDap:   2007 Gardisil:              n/a Influenza:            09/2013 Zostavax:              no  Bone density:      [place] Colonoscopy:      [place]icul[place].  Next colonoscopy due 2021  Cholesterol check: unsure  Family History  Problem Relation Age of Onset  . Lung cancer Father 86    dies age 53  . Emphysema Father   . COPD Brother 59    died age 82  . Stroke Mother   . Hypertension Sister   . Thyroid disease Sister     Graves Disease    Patient Active Problem List   Diagnosis Date Noted  . Seasonal and perennial allergic rhinitis 07/02/2012  . Dyspnea 05/25/2012  . GERD (gastroesophageal reflux disease) 05/16/2012  . Chronic cough 05/16/2012  . Obstructive lung disease 05/16/2012   Past Medical History  Diagnosis Date  . Bronchiectasis   . COPD (chronic obstructive pulmonary disease)   . GERD (gastroesophageal reflux disease)   . Bronchitis     chronic  .  PONV (postoperative nausea and vomiting)   . Hx of migraines   . H/O hiatal hernia     "sliding"  . Osteoarthritis     knees  . Positional headache since 1997    if lies on left side  . Depression   . Anxiety   . 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 Dental crown present     upper  . Abrasion of wrist, left 03/12/2013  . Environmental allergies     receives allergy shots  . Glaucoma high risk     is monitored every 6 mos. - no current med.  . Thyroid disease     hypothyroid    Past Surgical History  Procedure Laterality Date  . Tonsillectomy and adenoidectomy      age 75  . Tubal ligation  1986  . Colonoscopy    . Wisdom tooth extraction    . Knee arthroscopy  10/19/2012    Procedure: ARTHROSCOPY KNEE;  Surgeon: Thera Flake., MD;  Location: [facility];  Service: Orthopedics;  Laterality: Right;  . Knee arthroscopy with lateral menisectomy  10/19/2012    Procedure: KNEE ARTHROSCOPY WITH LATERAL MENISECTOMY;  Surgeon: Thera Flake., MD;  Location: [facility];  Service: Orthopedics;  Laterality: Right;  .  Chondroplasty  10/19/2012    Procedure: CHONDROPLASTY;  Surgeon: Thera Flake., MD;  Location: [facility];  Service: Orthopedics;  Laterality: Right;  . Knee arthroscopy Right 02/01/2013    Procedure: RIGHT KNEE ARTHROSCOPY WITH MEDIAL MENISCECTOMY, DEBRIDEMENT CHONDROMALACIA PATELLA;  Surgeon: Thera Flake., MD;  Location: [facility];  Service: Orthopedics;  Laterality: Right;  RIGHT KNEE ARTHROSCOPY WITH MEDIAL MENISCECTOMY, DEBRIDEMENT CHONDROMALACIA PATELLA  . Cholecystectomy  04/21/2011  . Knee arthroscopy Left 03/15/2013    Procedure: LEFT KNEE ARTHROSCOPY WITH DEBRIDEMENT/SHAVING CHONDROPLASTY WITH MEDIAL MENISECTOMY;  Surgeon: Thera Flake., MD;  Location: [facility];  Service: Orthopedics;  Laterality: Left;    ALLERGIES: Levaquin and Nickel  Current Outpatient Prescriptions  Medication Sig Dispense Refill  . albuterol (VENTOLIN HFA) 108 (90 BASE) MCG/ACT inhaler Inhale 2 puffs into the lungs every 6 (six) hours as needed.      Marland Kitchen b complex vitamins tablet Take 1 tablet by mouth daily.      . Calcium-Magnesium-Zinc 167-83-8 MG TABS Take 1 tablet by mouth daily.      . Celecoxib (CELEBREX PO) Take 1 capsule by mouth daily.      . Cholecalciferol (VITAMIN D) 2000 UNITS tablet Take 5,000 Units by mouth daily.       . fexofenadine (ALLEGRA) 180 MG tablet Take 180 mg by mouth daily.      Marland Kitchen ipratropium (ATROVENT) 0.03 % nasal spray Place 2 sprays into the nose every 12 (twelve) hours.  30 mL  6  . Lorcaserin HCl (BELVIQ) 10 MG TABS Take 1 tablet by mouth 2 (two) times daily.      . mometasone (NASONEX) 50 MCG/ACT nasal spray Place 2 sprays into the nose daily.      . montelukast (SINGULAIR) 10 MG tablet TAKE 1 TABLET BY MOUTH ONCE DAILY  90 tablet  1  . pantoprazole (PROTONIX) 40 MG tablet TAKE 1 TABLET BY MOUTH ONCE DAILY  30 tablet  11  . thyroid (ARMOUR) 15 MG tablet Take 15 mg by mouth daily.      . vitamin A 45409 UNIT capsule Take 10,000  Units by mouth daily.      Marland Kitchen  lisinopril (PRINIVIL,ZESTRIL) 5 MG tablet Take 5 mg by mouth daily.       No current facility-administered medications for this visit.     ROS:  Pertinent items are noted in HPI.  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  Lives in [place] and commutes every day.   PHYSICAL EXAMINATION:    BP 140/70  Pulse 80  Ht 5' 0.5" (1.537 m)  Wt 193 lb (87.544 kg)  BMI 37.06 kg/m2  LMP 12/12/2005   Wt Readings from Last 3 Encounters:  10/30/13 193 lb (87.544 kg)  07/16/13 212 lb 9.6 oz (96.435 kg)  03/15/13 203 lb (92.08 kg)     Ht Readings from Last 3 Encounters:  10/30/13 5' 0.5" (1.537 m)  07/16/13 5\' 2"  (1.575 m)  03/15/13 5\' 2"  (1.575 m)    General appearance: alert, cooperative and appears stated age Head: Normocephalic, without obvious abnormality, atraumatic Neck: no adenopathy, supple, symmetrical, trachea midline and thyroid not enlarged, symmetric, no tenderness/mass/nodules Lungs: clear to auscultation bilaterally Breasts: Inspection negative, No nipple retraction or dimpling, No nipple discharge or bleeding, No axillary or supraclavicular adenopathy, Normal to palpation without dominant masses Heart: regular rate and rhythm Abdomen: small pannus noted, soft, non-tender; no masses,  no organomegaly Extremities: extremities normal, atraumatic, no cyanosis or edema Skin: Skin color, texture, turgor normal. No rashes or lesions Lymph nodes: Cervical, supraclavicular, and axillary nodes normal. No abnormal inguinal nodes palpated Neurologic: Grossly normal  Pelvic: External genitalia:  no lesions              Urethra:  normal appearing urethra with no masses, tenderness or lesions              Bartholins and Skenes: normal                 Vagina: normal appearing vagina with normal color and discharge, no lesions.  Second degree cystocele, first degree rectocele, first degree uterine prolapse.               Cervix: normal  appearance              Pap and high risk HPV testing done: yes.            Bimanual Exam:  Uterus:  uterus is normal size, shape, consistency and nontender                                      Adnexa: normal adnexa in size, nontender and no masses                                      Rectovaginal: Confirms                                      Anus:  normal sphincter tone, no lesions  ASSESSMENT  Incomplete uterovaginal prolapse.  Failed cube pessary. Genuine stress incontinence. Glaucoma.  Microscopic hematuria with negative work up.  Overweight.  Doing weight loss.  PLAN  Mammogram yearly.  Encouraged self breast exam.  Pap smear and high risk HPV testing. Labs with PCP.  Counseled on prolapse and incontinence and treatment options including observation, physical therapy, pessary, and surgery. If surgery is chosen,  I would recommend  proceeding with urodynamic testing.  For a surgical approach, I currently would proceed with a vaginal hysterectomy with anterior and posterior colporrhaphy with possible vaginal vault suspension and biological graft use and possible midurethral sling with cystoscopy.  I have had a preliminary discussion of risks of surgery which include but are not limited to bleeding, infection, damage to surrounding organs, reaction to anesthesia, pneumonia, DVT, PE, death, recurrence of prolapse, need for reoperation, and graft exposure/erosion.  I have given the patient written materials from [facility] on pelvic organ prolapse and urinary incontinence. She will contact the office when she makes a decision about the route she would like to pursue.   An additional 30 minutes were spent with the patient face to face regarding prolapse and incontinence.  Over 50% was spent in counseling the patient.   After visit summary to the patient.     Return annually or prn   An After Visit Summary was printed and given to the patient.  30 additional minutes of face to face time  discussing prolapse and urinary incontinence - over 50% of time spent in counseling.

## 2013-10-31 ENCOUNTER — Ambulatory Visit (HOSPITAL_COMMUNITY)
Admission: RE | Admit: 2013-10-31 | Discharge: 2013-10-31 | Disposition: A | Discharge status: Home / Self Care | Payer: 59 | Source: Ambulatory Visit | Attending: Family Medicine | Admitting: Family Medicine

## 2013-10-31 DIAGNOSIS — R599 Enlarged lymph nodes, unspecified: Secondary | ICD-10-CM | POA: Insufficient documentation

## 2013-10-31 DIAGNOSIS — E039 Hypothyroidism, unspecified: Secondary | ICD-10-CM | POA: Insufficient documentation

## 2013-10-31 DIAGNOSIS — E041 Nontoxic single thyroid nodule: Secondary | ICD-10-CM | POA: Insufficient documentation

## 2013-11-04 LAB — IPS PAP TEST WITH HPV

## 2013-11-12 ENCOUNTER — Ambulatory Visit (INDEPENDENT_AMBULATORY_CARE_PROVIDER_SITE_OTHER): Payer: 59

## 2013-11-12 DIAGNOSIS — J309 Allergic rhinitis, unspecified: Secondary | ICD-10-CM

## 2013-12-03 ENCOUNTER — Other Ambulatory Visit (HOSPITAL_COMMUNITY): Payer: Self-pay | Admitting: Orthopedic Surgery

## 2013-12-03 DIAGNOSIS — M25562 Pain in left knee: Secondary | ICD-10-CM

## 2013-12-10 ENCOUNTER — Ambulatory Visit (INDEPENDENT_AMBULATORY_CARE_PROVIDER_SITE_OTHER): Payer: 59

## 2013-12-10 ENCOUNTER — Ambulatory Visit (HOSPITAL_COMMUNITY)
Admission: RE | Admit: 2013-12-10 | Discharge: 2013-12-10 | Disposition: A | Discharge status: Home / Self Care | Payer: 59 | Source: Ambulatory Visit | Attending: Orthopedic Surgery | Admitting: Orthopedic Surgery

## 2013-12-10 DIAGNOSIS — M2240 Chondromalacia patellae, unspecified knee: Secondary | ICD-10-CM | POA: Insufficient documentation

## 2013-12-10 DIAGNOSIS — M6740 Ganglion, unspecified site: Secondary | ICD-10-CM | POA: Insufficient documentation

## 2013-12-10 DIAGNOSIS — M1710 Unilateral primary osteoarthritis, unspecified knee: Secondary | ICD-10-CM | POA: Insufficient documentation

## 2013-12-10 DIAGNOSIS — M25469 Effusion, unspecified knee: Secondary | ICD-10-CM | POA: Insufficient documentation

## 2013-12-10 DIAGNOSIS — M25562 Pain in left knee: Secondary | ICD-10-CM

## 2013-12-10 DIAGNOSIS — J309 Allergic rhinitis, unspecified: Secondary | ICD-10-CM

## 2013-12-10 DIAGNOSIS — M898X9 Other specified disorders of bone, unspecified site: Secondary | ICD-10-CM | POA: Insufficient documentation

## 2013-12-17 ENCOUNTER — Ambulatory Visit: Payer: 59

## 2013-12-19 ENCOUNTER — Encounter: Payer: Self-pay | Admitting: Internal Medicine

## 2013-12-31 ENCOUNTER — Ambulatory Visit (INDEPENDENT_AMBULATORY_CARE_PROVIDER_SITE_OTHER): Payer: 59

## 2013-12-31 DIAGNOSIS — J309 Allergic rhinitis, unspecified: Secondary | ICD-10-CM

## 2014-01-08 ENCOUNTER — Ambulatory Visit (INDEPENDENT_AMBULATORY_CARE_PROVIDER_SITE_OTHER): Payer: 59

## 2014-01-08 ENCOUNTER — Telehealth: Payer: Self-pay | Admitting: Internal Medicine

## 2014-01-08 DIAGNOSIS — J309 Allergic rhinitis, unspecified: Secondary | ICD-10-CM

## 2014-01-08 NOTE — Telephone Encounter (Signed)
Ok to move her up to  Allergy vaccine 1:10, build per protocol.

## 2014-01-08 NOTE — Telephone Encounter (Signed)
Thank you. I will do that:)

## 2014-01-08 NOTE — Telephone Encounter (Signed)
[name] [name] is on 1:50 at 0.5. You said in your notes" I will check on your dosing-there may be room to increase the strength one more step.(at her last visit 07/16/13) Is there room? The reason I'm asking she just ran out of 1:50 vac.Marland Kitchen Please let me know if you do want to change it so I can make up her vac.. Thanks, Alroy Bailiff

## 2014-01-09 ENCOUNTER — Ambulatory Visit (INDEPENDENT_AMBULATORY_CARE_PROVIDER_SITE_OTHER): Payer: 59

## 2014-01-09 DIAGNOSIS — J309 Allergic rhinitis, unspecified: Secondary | ICD-10-CM

## 2014-01-13 ENCOUNTER — Ambulatory Visit: Payer: 59

## 2014-01-29 ENCOUNTER — Ambulatory Visit (INDEPENDENT_AMBULATORY_CARE_PROVIDER_SITE_OTHER): Payer: 59

## 2014-01-29 DIAGNOSIS — J309 Allergic rhinitis, unspecified: Secondary | ICD-10-CM

## 2014-02-04 ENCOUNTER — Ambulatory Visit: Payer: 59

## 2014-02-10 ENCOUNTER — Ambulatory Visit (INDEPENDENT_AMBULATORY_CARE_PROVIDER_SITE_OTHER): Payer: 59

## 2014-02-10 DIAGNOSIS — J309 Allergic rhinitis, unspecified: Secondary | ICD-10-CM

## 2014-02-18 ENCOUNTER — Ambulatory Visit: Payer: 59

## 2014-02-20 ENCOUNTER — Telehealth: Payer: Self-pay | Admitting: Internal Medicine

## 2014-02-20 ENCOUNTER — Other Ambulatory Visit: Payer: Self-pay | Admitting: *Deleted

## 2014-02-20 MED ORDER — IPRATROPIUM-ALBUTEROL 0.5-2.5 (3) MG/3ML IN SOLN: 3.0000 mL | RESPIRATORY_TRACT | Status: DC | PRN

## 2014-02-20 MED ORDER — ALBUTEROL SULFATE (2.5 MG/3ML) 0.083% IN NEBU: 2.5000 mg | INHALATION_SOLUTION | Freq: Four times a day (QID) | RESPIRATORY_TRACT | Status: DC | PRN

## 2014-02-20 MED ORDER — IPRATROPIUM-ALBUTEROL 0.5-2.5 (3) MG/3ML IN SOLN: 3.0000 mL | Freq: Four times a day (QID) | RESPIRATORY_TRACT | Status: DC | PRN

## 2014-02-20 MED ORDER — IPRATROPIUM BROMIDE 0.02 % IN SOLN: 0.5000 mg | Freq: Four times a day (QID) | RESPIRATORY_TRACT | Status: DC | PRN

## 2014-02-20 NOTE — Telephone Encounter (Signed)
Ok to refill atrovent neb  Ok to Rx albuterol 0.083, # 90, 1 every 6 hours, if needed, ref prn

## 2014-02-20 NOTE — Telephone Encounter (Signed)
Spoke with the pharmacist  Pt was requesting duoneb rather than albuterol and atrovent seperately  I advised this is fine and gave VO for 90 days with 3 rf Nothing further needed

## 2014-02-20 NOTE — Telephone Encounter (Signed)
Pt aware RX's have been sent. Nothing further needed 

## 2014-02-20 NOTE — Telephone Encounter (Signed)
Called spoke with pt. She is requesting refill on albuterol and atrovent nebs. I do not see where we have ever filled albuterol neb for pt but atrovent was last refilled 06/2012.  Pt last OV 07/16/13 w/ CDY. Pt reports these meds are expired. Wants refill ASAP today. Please advise Dr. Annamaria Boots thanks

## 2014-02-20 NOTE — Telephone Encounter (Signed)
Not on pt med list ok to add per CDY

## 2014-02-20 NOTE — Telephone Encounter (Signed)
lmomtcb x1 for pt--no neb meds on pt med list.

## 2014-02-20 NOTE — Telephone Encounter (Signed)
Pt is returning call & can be reached at [phone].  [name] [name]

## 2014-02-21 ENCOUNTER — Ambulatory Visit (INDEPENDENT_AMBULATORY_CARE_PROVIDER_SITE_OTHER): Payer: 59

## 2014-02-21 DIAGNOSIS — J309 Allergic rhinitis, unspecified: Secondary | ICD-10-CM

## 2014-03-04 ENCOUNTER — Ambulatory Visit: Payer: 59

## 2014-03-27 ENCOUNTER — Ambulatory Visit (INDEPENDENT_AMBULATORY_CARE_PROVIDER_SITE_OTHER): Payer: 59

## 2014-03-27 DIAGNOSIS — J309 Allergic rhinitis, unspecified: Secondary | ICD-10-CM

## 2014-04-07 ENCOUNTER — Ambulatory Visit (INDEPENDENT_AMBULATORY_CARE_PROVIDER_SITE_OTHER): Payer: 59

## 2014-04-07 DIAGNOSIS — J309 Allergic rhinitis, unspecified: Secondary | ICD-10-CM

## 2014-04-17 ENCOUNTER — Other Ambulatory Visit: Payer: Self-pay | Admitting: Internal Medicine

## 2014-04-23 ENCOUNTER — Ambulatory Visit (INDEPENDENT_AMBULATORY_CARE_PROVIDER_SITE_OTHER): Payer: 59

## 2014-04-23 DIAGNOSIS — J309 Allergic rhinitis, unspecified: Secondary | ICD-10-CM

## 2014-05-01 ENCOUNTER — Telehealth: Payer: Self-pay | Admitting: Internal Medicine

## 2014-05-01 ENCOUNTER — Ambulatory Visit (INDEPENDENT_AMBULATORY_CARE_PROVIDER_SITE_OTHER): Payer: 59

## 2014-05-01 ENCOUNTER — Ambulatory Visit (INDEPENDENT_AMBULATORY_CARE_PROVIDER_SITE_OTHER): Payer: 59 | Admitting: Internal Medicine

## 2014-05-01 ENCOUNTER — Encounter: Payer: Self-pay | Admitting: Internal Medicine

## 2014-05-01 VITALS — BP 132/76 | HR 66 | Temp 98.6°F | Ht 62.0 in | Wt 158.6 lb

## 2014-05-01 DIAGNOSIS — J4521 Mild intermittent asthma with (acute) exacerbation: Secondary | ICD-10-CM

## 2014-05-01 DIAGNOSIS — J45901 Unspecified asthma with (acute) exacerbation: Secondary | ICD-10-CM

## 2014-05-01 DIAGNOSIS — J309 Allergic rhinitis, unspecified: Secondary | ICD-10-CM

## 2014-05-01 DIAGNOSIS — J302 Other seasonal allergic rhinitis: Secondary | ICD-10-CM

## 2014-05-01 DIAGNOSIS — J3089 Other allergic rhinitis: Principal | ICD-10-CM

## 2014-05-01 MED ORDER — METHYLPREDNISOLONE ACETATE 80 MG/ML IJ SUSP
80.0000 mg | Freq: Once | INTRAMUSCULAR | Status: AC
  Administered 2014-05-01: 80 mg via INTRAMUSCULAR

## 2014-05-01 MED ORDER — AZITHROMYCIN 250 MG PO TABS: ORAL_TABLET | ORAL | Status: DC

## 2014-05-01 NOTE — Patient Instructions (Addendum)
Depo 80  Script Zpak  If you have no problems with allergy vaccine at the 1:50 concentration, then we will move you up to the 1:10 concentration. You can ask the allergy lab to discuss this with me.

## 2014-05-01 NOTE — Progress Notes (Signed)
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 She is followed here by Dr Chase Caller for Cough/ obstructive lung disease. Dr Marcello Moores [facility]; needs to establish here for allergies(has vaccine from [facility])-works at [employer] and more convenient. Past 2 nights has had more drainage and feels like working up a sinus infection(has had biaxan and prednisone-3 rds already this year) She gives a history of recurrent pneumonias. These usually start with sinus infection, then bronchitis which is hard to clear. She has not been diagnosed with asthma. This spring she was on sustained antibiotics and prednisone. She was on allergy vaccine last year, from Dr Lady Saucier at [place], administered in [place]. She stopped after a few injections because of generalized itching. Her vaccine has been remixed weaker. She would like to restart, but has been sick this spring too frequently. Her vaccine is made and ready. She works here in town at [employer] and her insurance is through [employer]. She has had no ENT surgery. 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 Family history of allergies and asthma. She feels that she is headed into another episode of sinusitis/bronchitis and would like prescriptions for Biaxin and a prednisone taper to hold. We discussed steroid side effects.  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 She is followed here by Dr Chase Caller for Cough/ obstructive lung disease/ chronic bronchitis.  Patient states doing much better since last visit.  stillc c/o drainage and cough.  Patient states [name] sample helped with drainage.  Also, c/o itching  at the 0.1 dosage of the allergy vaccine and states the .05 dosage did ok.  Has not needed prednisone or Biaxin given at our last visit. No acute exacerbations. Vaccine volume was held at 0.05 mL from her Silver  vial through August. [name]. Allergy Profile 06/22/2012 total IgE 26.2/negative for specific elevations CBC 06/22/2012-WBC 6000 with normal differential Immunoglobulins 06/22/2012- IgG normal 929, IgA normal 153, IgM low 42 (52-322). Discussed with her.  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 She is followed here by Dr Chase Caller for Cough/ obstructive lung disease/ chronic bronchitis. [name].  [name] 1:50 .  Does have PND and cough with light mucus.   Did very well until the month of July and then began having increased nasal congestion. This would not have been the spring pollen season. She got a live chicken in April. Wakes coughing. Using Nasonex and ipratropium nasal sprays with daily Allegra.  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 She is followed here by Dr Chase Caller for Cough/ obstructive lung disease/ chronic bronchitis, complicated by GERD. C/o headache, PND, body ache, fatigue, cough with out  mucus production, mild SOB with moderate activity, and mild chest tightness d/t chest congestion per pt.  Symptoms occuring x 1 week. She has been outdoors a lot a week ago and blames that exposure for sinus headache, burning eyes-now mostly chest tightness and cough. She continues allergy vaccine 1:50 0.3 ML's GH, admitting she misses occasional dose. Actively remodeling home with associated dust exposure.   ROS-see HPI Constitutional:   No-   weight loss, night sweats, fevers, chills, fatigue, lassitude. HEENT:   No-  headaches, difficulty swallowing, tooth/dental problems, sore throat,  No-  sneezing, itching, ear ache, +nasal congestion, post nasal drip,  CV:  No-   chest pain, orthopnea, PND, swelling in lower extremities, anasarca,  dizziness, palpitations Resp: No-   shortness of breath with exertion or at rest.              No- productive cough,  + non-productive cough,  No- coughing up of blood.              No-   change in color of mucus.  No- wheezing.   Skin: No-   rash or lesions. GI:  No-   heartburn, indigestion, abdominal pain, nausea, vomiting,  GU:  MS:  No-   joint pain or swelling.   Neuro-     nothing unusual Psych:  No- change in mood or affect. No depression or anxiety.  No memory loss.  OBJ- Physical Exam General- Alert, Oriented, Affect-appropriate, Distress- none acute, overweight Skin- rash-none, lesions- none, excoriation- none Lymphadenopathy- none Head- atraumatic            Eyes- Gross vision intact, PERRLA, conjunctivae and secretions clear            Ears- Hearing, canals-normal            Nose- + turbinate edema, no-Septal dev, mucus, polyps, erosion, perforation             Throat- Mallampati II-III , mucosa clear , drainage- none, tonsils- atrophic, + hoarse Neck- flexible , trachea midline, no stridor , thyroid nl, carotid no bruit Chest - symmetrical excursion , unlabored           Heart/CV- RRR , no murmur , no gallop  , no rub, nl s1 s2                           - JVD- none , edema- none, stasis changes- none, varices- none           Lung- clear to P&A, wheeze- none, + minimal dry cough , dullness-none, rub- none           Chest wall-  Abd-  Br/ Gen/ Rectal- Not done, not indicated Extrem- cyanosis- none, clubbing, none, atrophy- none, strength- nl Neuro- grossly intact to observation

## 2014-05-01 NOTE — Telephone Encounter (Signed)
Spoke with the pt  She is c/o sore throat, chest congestion, cough, fever and aches  OV with CDY for today at 1:30

## 2014-05-07 ENCOUNTER — Ambulatory Visit (INDEPENDENT_AMBULATORY_CARE_PROVIDER_SITE_OTHER): Payer: 59

## 2014-05-07 DIAGNOSIS — J309 Allergic rhinitis, unspecified: Secondary | ICD-10-CM

## 2014-05-09 ENCOUNTER — Telehealth: Payer: Self-pay | Admitting: Internal Medicine

## 2014-05-09 NOTE — Telephone Encounter (Signed)
[name] [name] started 1:10 01/29/14, she has missed some shots. She just reached 0.5  05/07/14, this was her most recent shot. She's been sick for 3 wks. And is not much better.[name] [name] wanted me to ask you how long should she allow For the vac. To start working. She is living in a renovation: dust,glue and paint. I explained to her it really hasn't had a chance to work since she hasn't been consistent.(She missed a few shots here and there.) Please advise.

## 2014-05-11 NOTE — Telephone Encounter (Signed)
Individual response time is partly genetic. Need to be considering if symptoms are infection or some other issue now    (you mention building renovation), as Spring pollen season is winding down. We may need to see her.

## 2014-05-12 NOTE — Telephone Encounter (Signed)
I called [name] [name] I gave her your advice,she understood and will come in if she's no better. She 's coming in for her shot today or Thurs.Marland Kitchen

## 2014-05-16 ENCOUNTER — Ambulatory Visit (INDEPENDENT_AMBULATORY_CARE_PROVIDER_SITE_OTHER): Payer: 59

## 2014-05-16 DIAGNOSIS — J309 Allergic rhinitis, unspecified: Secondary | ICD-10-CM

## 2014-05-22 ENCOUNTER — Ambulatory Visit: Payer: 59

## 2014-05-26 ENCOUNTER — Ambulatory Visit (INDEPENDENT_AMBULATORY_CARE_PROVIDER_SITE_OTHER): Payer: 59

## 2014-05-26 DIAGNOSIS — J309 Allergic rhinitis, unspecified: Secondary | ICD-10-CM

## 2014-06-02 ENCOUNTER — Ambulatory Visit: Payer: 59

## 2014-06-03 ENCOUNTER — Ambulatory Visit (INDEPENDENT_AMBULATORY_CARE_PROVIDER_SITE_OTHER): Payer: 59

## 2014-06-03 DIAGNOSIS — J309 Allergic rhinitis, unspecified: Secondary | ICD-10-CM

## 2014-06-09 ENCOUNTER — Ambulatory Visit (INDEPENDENT_AMBULATORY_CARE_PROVIDER_SITE_OTHER): Payer: 59

## 2014-06-09 DIAGNOSIS — J309 Allergic rhinitis, unspecified: Secondary | ICD-10-CM

## 2014-06-11 ENCOUNTER — Ambulatory Visit (INDEPENDENT_AMBULATORY_CARE_PROVIDER_SITE_OTHER): Payer: 59

## 2014-06-11 DIAGNOSIS — J309 Allergic rhinitis, unspecified: Secondary | ICD-10-CM

## 2014-06-16 ENCOUNTER — Ambulatory Visit: Payer: 59

## 2014-06-17 ENCOUNTER — Ambulatory Visit (INDEPENDENT_AMBULATORY_CARE_PROVIDER_SITE_OTHER): Payer: 59

## 2014-06-17 DIAGNOSIS — J309 Allergic rhinitis, unspecified: Secondary | ICD-10-CM

## 2014-06-22 NOTE — Assessment & Plan Note (Signed)
Has been tolerating that allergy vaccine 1:50 0.3 mL. Plan-if she can get her shots more regularly and build the volume we will look at increasing concentrations at 1:10

## 2014-06-22 NOTE — Assessment & Plan Note (Addendum)
Acute exacerbation Plan-Depo-Medrol, Z-Pak

## 2014-06-24 ENCOUNTER — Ambulatory Visit: Payer: 59

## 2014-06-25 ENCOUNTER — Ambulatory Visit (INDEPENDENT_AMBULATORY_CARE_PROVIDER_SITE_OTHER): Payer: 59

## 2014-06-25 DIAGNOSIS — J309 Allergic rhinitis, unspecified: Secondary | ICD-10-CM

## 2014-07-03 ENCOUNTER — Ambulatory Visit (INDEPENDENT_AMBULATORY_CARE_PROVIDER_SITE_OTHER): Payer: 59

## 2014-07-03 DIAGNOSIS — J309 Allergic rhinitis, unspecified: Secondary | ICD-10-CM

## 2014-07-08 ENCOUNTER — Ambulatory Visit (INDEPENDENT_AMBULATORY_CARE_PROVIDER_SITE_OTHER): Payer: 59

## 2014-07-08 DIAGNOSIS — J309 Allergic rhinitis, unspecified: Secondary | ICD-10-CM

## 2014-07-17 ENCOUNTER — Ambulatory Visit (INDEPENDENT_AMBULATORY_CARE_PROVIDER_SITE_OTHER): Payer: 59

## 2014-07-17 DIAGNOSIS — J309 Allergic rhinitis, unspecified: Secondary | ICD-10-CM

## 2014-07-23 ENCOUNTER — Encounter: Payer: Self-pay | Admitting: Internal Medicine

## 2014-07-24 ENCOUNTER — Ambulatory Visit (INDEPENDENT_AMBULATORY_CARE_PROVIDER_SITE_OTHER): Payer: 59

## 2014-07-24 DIAGNOSIS — J309 Allergic rhinitis, unspecified: Secondary | ICD-10-CM

## 2014-07-31 ENCOUNTER — Ambulatory Visit (INDEPENDENT_AMBULATORY_CARE_PROVIDER_SITE_OTHER): Payer: 59

## 2014-07-31 DIAGNOSIS — J309 Allergic rhinitis, unspecified: Secondary | ICD-10-CM

## 2014-08-04 ENCOUNTER — Other Ambulatory Visit: Payer: Self-pay | Admitting: Internal Medicine

## 2014-08-05 ENCOUNTER — Ambulatory Visit (INDEPENDENT_AMBULATORY_CARE_PROVIDER_SITE_OTHER): Payer: 59

## 2014-08-05 DIAGNOSIS — J309 Allergic rhinitis, unspecified: Secondary | ICD-10-CM

## 2014-08-19 ENCOUNTER — Encounter: Payer: Self-pay | Admitting: Obstetrics and Gynecology

## 2014-08-19 ENCOUNTER — Ambulatory Visit (INDEPENDENT_AMBULATORY_CARE_PROVIDER_SITE_OTHER): Payer: 59

## 2014-08-19 DIAGNOSIS — J309 Allergic rhinitis, unspecified: Secondary | ICD-10-CM

## 2014-08-21 ENCOUNTER — Telehealth: Payer: Self-pay | Admitting: Internal Medicine

## 2014-08-21 NOTE — Telephone Encounter (Signed)
I was getting ready to mix pt.'s vac. And I in your last ov notes you had said if she reached her maintenance dose you would consider increasing her vac.[name] [name] Pt. Has been at 0.5 of 1:50 for awhile now. Please let me know if you wish to increase her vac. So I can have it ready when she comes in next wk.. Thanks, Alroy Bailiff

## 2014-08-21 NOTE — Telephone Encounter (Signed)
Noted. I will make it up and have her vac. Ready for next wk.

## 2014-08-21 NOTE — Telephone Encounter (Signed)
Ok to advance to 1:10 with this next order, Thanks -CDY

## 2014-08-22 ENCOUNTER — Other Ambulatory Visit: Payer: Self-pay | Admitting: Internal Medicine

## 2014-08-25 ENCOUNTER — Ambulatory Visit (INDEPENDENT_AMBULATORY_CARE_PROVIDER_SITE_OTHER): Payer: 59

## 2014-08-25 DIAGNOSIS — J309 Allergic rhinitis, unspecified: Secondary | ICD-10-CM

## 2014-08-28 ENCOUNTER — Ambulatory Visit (INDEPENDENT_AMBULATORY_CARE_PROVIDER_SITE_OTHER): Payer: 59

## 2014-08-28 DIAGNOSIS — J309 Allergic rhinitis, unspecified: Secondary | ICD-10-CM

## 2014-09-04 ENCOUNTER — Ambulatory Visit (INDEPENDENT_AMBULATORY_CARE_PROVIDER_SITE_OTHER): Payer: 59

## 2014-09-04 DIAGNOSIS — J309 Allergic rhinitis, unspecified: Secondary | ICD-10-CM

## 2014-09-09 ENCOUNTER — Ambulatory Visit: Payer: 59

## 2014-09-12 ENCOUNTER — Ambulatory Visit (INDEPENDENT_AMBULATORY_CARE_PROVIDER_SITE_OTHER): Payer: 59

## 2014-09-12 DIAGNOSIS — J309 Allergic rhinitis, unspecified: Secondary | ICD-10-CM

## 2014-09-18 ENCOUNTER — Ambulatory Visit (INDEPENDENT_AMBULATORY_CARE_PROVIDER_SITE_OTHER): Payer: 59

## 2014-09-18 DIAGNOSIS — J309 Allergic rhinitis, unspecified: Secondary | ICD-10-CM

## 2014-09-24 ENCOUNTER — Ambulatory Visit: Payer: 59

## 2014-09-24 ENCOUNTER — Telehealth: Payer: Self-pay | Admitting: Obstetrics and Gynecology

## 2014-09-24 NOTE — Telephone Encounter (Signed)
Call to pt dr cx/rs lmtcb

## 2014-10-02 ENCOUNTER — Ambulatory Visit (INDEPENDENT_AMBULATORY_CARE_PROVIDER_SITE_OTHER): Payer: 59

## 2014-10-02 ENCOUNTER — Telehealth: Payer: Self-pay | Admitting: Internal Medicine

## 2014-10-02 ENCOUNTER — Ambulatory Visit (INDEPENDENT_AMBULATORY_CARE_PROVIDER_SITE_OTHER): Payer: 59 | Admitting: Adult Health

## 2014-10-02 ENCOUNTER — Encounter: Payer: Self-pay | Admitting: Adult Health

## 2014-10-02 VITALS — BP 144/80 | HR 80 | Temp 97.9°F | Ht 62.0 in | Wt 152.0 lb

## 2014-10-02 DIAGNOSIS — J309 Allergic rhinitis, unspecified: Secondary | ICD-10-CM

## 2014-10-02 DIAGNOSIS — J4531 Mild persistent asthma with (acute) exacerbation: Secondary | ICD-10-CM

## 2014-10-02 MED ORDER — AZITHROMYCIN 250 MG PO TABS: ORAL_TABLET | ORAL | Status: AC

## 2014-10-02 NOTE — Assessment & Plan Note (Signed)
Flare with URI /allergic rhinitis  ?Ace aggravating cough and frequent flare   Plan  Zpack take as directed.  Mucinex DM Twice daily  As needed  Cough/congestion .  Saline nasal rinses and gel As needed   Astelin 2 puffs At bedtime   Remain of Nasonex .  Fluids and rest  Would discuss with family doctor regarding Lisinopril -side effects of cough, may benefit from change.  Please contact office for sooner follow up if symptoms do not improve or worsen or seek emergency care  Follow up Dr. Annamaria Boots  In 3-4 months and As needed

## 2014-10-02 NOTE — Telephone Encounter (Signed)
Pt to see Tammy today at 2:00.

## 2014-10-02 NOTE — Progress Notes (Signed)
   Subjective:    Patient ID: [name] [name], female    DOB: 07-09-1956, 58 y.o.   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 Remains on Allegra and singulair .  Presents for an acute office visit.  Complains of chills, body aches and scratchy sore throat for past 2 weeks..She saw eye doctor who told her to stop Nasonex due to increased optic pressure. She was told to try Astelin . Has been off Nasonex for 3 weeks.  Flu shot utd  Last cxr was in 03/2014 with PCP in [place], reported nml.  On Ace inhibitor .       Review of Systems Constitutional:   No  weight loss, night sweats,  + Fevers, chills, fatigue, or  lassitude.  HEENT:   No headaches,  Difficulty swallowing,  Tooth/dental problems, or  Sore throat,                No sneezing, itching, ear ache, + nasal congestion, post nasal drip,   CV:  No chest pain,  Orthopnea, PND, swelling in lower extremities, anasarca, dizziness, palpitations, syncope.   GI  No heartburn, indigestion, abdominal pain, nausea, vomiting, diarrhea, change in bowel habits, loss of appetite, bloody stools.   Resp: N   No chest wall deformity  Skin: no rash or lesions.  GU: no dysuria, change in color of urine, no urgency or frequency.  No flank pain, no hematuria   MS:  No joint pain or swelling.  No decreased range of motion.  No back pain.  Psych:  No change in mood or affect. No depression or anxiety.  No memory loss.          Objective:   Physical Exam GEN: A/Ox3; pleasant , NAD, well nourished   HEENT:  [place]/AT,  EACs-clear, TMs-wnl, NOSE-clear, drainage THROAT-clear, no lesions, no postnasal drip or exudate noted. ,   NECK:  Supple w/ fair ROM; no JVD; normal carotid impulses w/o bruits; no thyromegaly or nodules palpated; no lymphadenopathy., frequent throat clearing noted during  exam  RESP  Clear  P & A; w/o, wheezes/ rales/ or rhonchi.no accessory muscle use, no dullness to percussion  CARD:  RRR, no m/r/g  , no peripheral edema, pulses intact, no cyanosis or clubbing.  GI:   Soft & nt; nml bowel sounds; no organomegaly or masses detected.  Musco: Warm bil, no deformities or joint swelling noted.   Neuro: alert, no focal deficits noted.    Skin: Warm, no lesions or rashes         Assessment & Plan:

## 2014-10-02 NOTE — Patient Instructions (Signed)
Zpack take as directed.  Mucinex DM Twice daily  As needed  Cough/congestion .  Saline nasal rinses and gel As needed   Astelin 2 puffs At bedtime   Remain of Nasonex .  Fluids and rest  Would discuss with family doctor regarding Lisinopril -side effects of cough, may benefit from change.  Please contact office for sooner follow up if symptoms do not improve or worsen or seek emergency care  Follow up Dr. Annamaria Boots  In 3-4 months and As needed

## 2014-10-09 ENCOUNTER — Ambulatory Visit (INDEPENDENT_AMBULATORY_CARE_PROVIDER_SITE_OTHER): Payer: 59

## 2014-10-09 DIAGNOSIS — J309 Allergic rhinitis, unspecified: Secondary | ICD-10-CM

## 2014-10-13 ENCOUNTER — Encounter: Payer: Self-pay | Admitting: Adult Health

## 2014-10-14 ENCOUNTER — Ambulatory Visit (INDEPENDENT_AMBULATORY_CARE_PROVIDER_SITE_OTHER): Payer: 59

## 2014-10-14 DIAGNOSIS — J309 Allergic rhinitis, unspecified: Secondary | ICD-10-CM

## 2014-10-17 ENCOUNTER — Encounter: Payer: Self-pay | Admitting: Internal Medicine

## 2014-10-21 ENCOUNTER — Ambulatory Visit: Payer: 59

## 2014-10-22 ENCOUNTER — Ambulatory Visit (INDEPENDENT_AMBULATORY_CARE_PROVIDER_SITE_OTHER): Payer: 59

## 2014-10-22 DIAGNOSIS — J309 Allergic rhinitis, unspecified: Secondary | ICD-10-CM

## 2014-10-31 ENCOUNTER — Encounter: Payer: Self-pay | Admitting: Internal Medicine

## 2014-10-31 ENCOUNTER — Ambulatory Visit: Payer: 59 | Admitting: Obstetrics and Gynecology

## 2014-10-31 ENCOUNTER — Ambulatory Visit: Payer: 59

## 2014-11-03 ENCOUNTER — Other Ambulatory Visit: Payer: Self-pay | Admitting: Internal Medicine

## 2014-11-03 ENCOUNTER — Ambulatory Visit: Payer: 59 | Admitting: Obstetrics and Gynecology

## 2014-11-14 ENCOUNTER — Other Ambulatory Visit: Payer: Self-pay | Admitting: Internal Medicine

## 2014-11-17 ENCOUNTER — Emergency Department (HOSPITAL_COMMUNITY)
Admission: EM | Admit: 2014-11-17 | Discharge: 2014-11-17 | Disposition: A | Discharge status: Home / Self Care | Payer: 59 | Attending: Emergency Medicine | Admitting: Emergency Medicine

## 2014-11-17 ENCOUNTER — Emergency Department (HOSPITAL_COMMUNITY): Payer: 59

## 2014-11-17 ENCOUNTER — Encounter (HOSPITAL_COMMUNITY): Payer: Self-pay | Admitting: Emergency Medicine

## 2014-11-17 DIAGNOSIS — G43909 Migraine, unspecified, not intractable, without status migrainosus: Secondary | ICD-10-CM | POA: Insufficient documentation

## 2014-11-17 DIAGNOSIS — E039 Hypothyroidism, unspecified: Secondary | ICD-10-CM | POA: Diagnosis not present

## 2014-11-17 DIAGNOSIS — R002 Palpitations: Secondary | ICD-10-CM | POA: Insufficient documentation

## 2014-11-17 DIAGNOSIS — F419 Anxiety disorder, unspecified: Secondary | ICD-10-CM | POA: Insufficient documentation

## 2014-11-17 DIAGNOSIS — I10 Essential (primary) hypertension: Secondary | ICD-10-CM | POA: Diagnosis not present

## 2014-11-17 DIAGNOSIS — J449 Chronic obstructive pulmonary disease, unspecified: Secondary | ICD-10-CM | POA: Diagnosis not present

## 2014-11-17 DIAGNOSIS — M1990 Unspecified osteoarthritis, unspecified site: Secondary | ICD-10-CM | POA: Insufficient documentation

## 2014-11-17 DIAGNOSIS — K219 Gastro-esophageal reflux disease without esophagitis: Secondary | ICD-10-CM | POA: Insufficient documentation

## 2014-11-17 DIAGNOSIS — Z7982 Long term (current) use of aspirin: Secondary | ICD-10-CM | POA: Diagnosis not present

## 2014-11-17 DIAGNOSIS — F329 Major depressive disorder, single episode, unspecified: Secondary | ICD-10-CM | POA: Diagnosis not present

## 2014-11-17 DIAGNOSIS — Z79899 Other long term (current) drug therapy: Secondary | ICD-10-CM | POA: Diagnosis not present

## 2014-11-17 LAB — BASIC METABOLIC PANEL
Anion gap: 14 (ref 5–15)
BUN: 13 mg/dL (ref 6–23)
CO2: 25 mEq/L (ref 19–32)
Calcium: 9.3 mg/dL (ref 8.4–10.5)
Chloride: 105 mEq/L (ref 96–112)
Creatinine, Ser: 0.68 mg/dL (ref 0.50–1.10)
GFR calc Af Amer: >90 mL/min (ref >90)
GFR calc non Af Amer: >90 mL/min (ref >90)
Glucose, Bld: 109 mg/dL — ABNORMAL HIGH (ref 70–99)
Potassium: 4.2 mEq/L (ref 3.7–5.3)
Sodium: 144 mEq/L (ref 137–147)

## 2014-11-17 LAB — CBC
HCT: 35 % — ABNORMAL LOW (ref 36.0–46.0)
Hemoglobin: 11.8 g/dL — ABNORMAL LOW (ref 12.0–15.0)
MCH: 32.1 pg (ref 26.0–34.0)
MCHC: 33.7 g/dL (ref 30.0–36.0)
MCV: 95.1 fL (ref 78.0–100.0)
Platelets: 326 10*3/uL (ref 150–400)
RBC: 3.68 MIL/uL — ABNORMAL LOW (ref 3.87–5.11)
RDW: 12.7 % (ref 11.5–15.5)
WBC: 5.4 10*3/uL (ref 4.0–10.5)

## 2014-11-17 LAB — I-STAT TROPONIN, ED: Troponin i, poc: 0 ng/mL (ref 0.00–0.08)

## 2014-11-17 NOTE — ED Provider Notes (Signed)
CSN: [id number]     Arrival date & time 11/17/14  1837 History   First MD Initiated Contact with Patient 11/17/14 1917     Chief Complaint  Patient presents with  . Palpitations   [name] [name] is a 58 y.o. female with a history of hypertension, GERD, COPD, anxiety, depression who presents the ED complaining of palpitations intermittently for the past 3 weeks which have been worse today. Patient reports she's felt like her heart is pounding since around 6 AM this morning when she woke up. The patient denies chest pain or shortness of breath. The intensity of her palpitations waxes and wanes throughout the day. Patient has checked her pulse and it has been normal. Patient reports she's been more fatigued over the past 3 weeks and has had an extensive workup for Epstein-Barr virus recently. Patient also reports she's had a normal TSH recently. The patient reports she drinks a large cup of coffee every day and drinks no other caffeine during the day. Patient reports checking a glass of wine a day. Patient denies personal history of cardiovascular disease. Patient reports that her brother died of MI at age 38. Patient is not a smoker and has no history of hyperlipidemia. Patient reports a normal exercise stress test in 2013. The patient denies fevers, chest pain, shortness of breath, abdominal pain, nausea, vomiting, numbness, tingling, or cough.  (Consider location/radiation/quality/duration/timing/severity/associated sxs/prior Treatment) HPI  Past Medical History  Diagnosis Date  . Bronchiectasis   . COPD (chronic obstructive pulmonary disease)   . GERD (gastroesophageal reflux disease)   . Bronchitis     chronic  . PONV (postoperative nausea and vomiting)   . Hx of migraines   . H/O hiatal hernia     "sliding"  . Osteoarthritis     knees  . Positional headache since 1997    if lies on left side  . Depression   . Anxiety   . 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 Dental crown  present     upper  . Abrasion of wrist, left 03/12/2013  . Environmental allergies     receives allergy shots  . Glaucoma high risk     is monitored every 6 mos. - no current med.  . Thyroid disease     hypothyroid   Past Surgical History  Procedure Laterality Date  . Tonsillectomy and adenoidectomy      age 9  . Tubal ligation  1986  . Colonoscopy    . Wisdom tooth extraction    . Knee arthroscopy  10/19/2012    Procedure: ARTHROSCOPY KNEE;  Surgeon: Yvette Rack., MD;  Location: [facility];  Service: Orthopedics;  Laterality: Right;  . Knee arthroscopy with lateral menisectomy  10/19/2012    Procedure: KNEE ARTHROSCOPY WITH LATERAL MENISECTOMY;  Surgeon: Yvette Rack., MD;  Location: [facility];  Service: Orthopedics;  Laterality: Right;  . Chondroplasty  10/19/2012    Procedure: CHONDROPLASTY;  Surgeon: Yvette Rack., MD;  Location: [facility];  Service: Orthopedics;  Laterality: Right;  . Knee arthroscopy Right 02/01/2013    Procedure: RIGHT KNEE ARTHROSCOPY WITH MEDIAL MENISCECTOMY, DEBRIDEMENT CHONDROMALACIA PATELLA;  Surgeon: Yvette Rack., MD;  Location: [facility];  Service: Orthopedics;  Laterality: Right;  RIGHT KNEE ARTHROSCOPY WITH MEDIAL MENISCECTOMY, DEBRIDEMENT CHONDROMALACIA PATELLA  . Cholecystectomy  04/21/2011  . Knee arthroscopy Left 03/15/2013    Procedure: LEFT KNEE ARTHROSCOPY WITH DEBRIDEMENT/SHAVING CHONDROPLASTY WITH  MEDIAL MENISECTOMY;  Surgeon: Yvette Rack., MD;  Location: [facility];  Service: Orthopedics;  Laterality: Left;   Family History  Problem Relation Age of Onset  . Lung cancer Father 69    dies age 85  . Emphysema Father   . COPD Brother 71    died age 11  . Stroke Mother   . Hypertension Sister   . Thyroid disease Sister     Graves Disease   History  Substance Use Topics  . Smoking status: Never Smoker   . Smokeless tobacco: Never Used  . Alcohol Use: 1.0  oz/week    2 drink(s) per week     Comment: occasionally   OB History    Gravida Para Term Preterm AB TAB SAB Ectopic Multiple Living   4 4 4       4      Review of Systems  Constitutional: Negative for fever and chills.  HENT: Negative for congestion, ear pain and sore throat.   Eyes: Negative for pain, redness and visual disturbance.  Respiratory: Negative for cough, chest tightness, shortness of breath and wheezing.   Cardiovascular: Positive for palpitations. Negative for chest pain and leg swelling.  Gastrointestinal: Negative for nausea, vomiting, abdominal pain and diarrhea.  Genitourinary: Negative for dysuria, urgency, hematuria and difficulty urinating.  Musculoskeletal: Negative for back pain and neck pain.  Skin: Negative for rash and wound.  Neurological: Negative for dizziness, syncope, speech difficulty, weakness, light-headedness, numbness and headaches.  All other systems reviewed and are negative.     Allergies  Levaquin and Nickel  Home Medications   Prior to Admission medications   Medication Sig Start Date End Date Taking? Authorizing Provider  albuterol (PROVENTIL) (2.5 MG/3ML) 0.083% nebulizer solution Take 3 mLs (2.5 mg total) by nebulization every 6 (six) hours as needed for wheezing or shortness of breath. 02/20/14  Yes Deneise Lever, MD  albuterol (VENTOLIN HFA) 108 (90 BASE) MCG/ACT inhaler Inhale 2 puffs into the lungs every 6 (six) hours as needed for wheezing or shortness of breath.    Yes Historical Provider, MD  ALPRAZolam (XANAX) 0.25 MG tablet Take 0.125-0.25 mg by mouth daily.   Yes Historical Provider, MD  aspirin 81 MG chewable tablet Chew 162 mg by mouth daily.    Yes Historical Provider, MD  B Complex Vitamins (B-COMPLEX/B-12) LIQD Place 5 mLs under the tongue daily at 2 PM daily at 2 PM.   Yes Historical Provider, MD  Calcium-Magnesium-Zinc [phone] MG TABS Take 1 tablet by mouth daily.   Yes Historical Provider, MD  Cholecalciferol  (VITAMIN D) 2000 UNITS tablet Take 5,000 Units by mouth daily.    Yes Historical Provider, MD  fexofenadine (ALLEGRA) 180 MG tablet Take 180 mg by mouth daily.   Yes Historical Provider, MD  ipratropium (ATROVENT) 0.02 % nebulizer solution Take 2.5 mLs (0.5 mg total) by nebulization every 6 (six) hours as needed for wheezing or shortness of breath. 02/20/14  Yes Deneise Lever, MD  ipratropium (ATROVENT) 0.03 % nasal spray Place 2 sprays into the nose every 12 (twelve) hours as needed for rhinitis.  09/24/13  Yes Deneise Lever, MD  ipratropium-albuterol (DUONEB) 0.5-2.5 (3) MG/3ML SOLN Take 3 mLs by nebulization every 4 (four) hours as needed. Patient taking differently: Take 3 mLs by nebulization every 4 (four) hours as needed (wheezing or SOB).  02/20/14  Yes Deneise Lever, MD  lisinopril (PRINIVIL,ZESTRIL) 5 MG tablet Take 5 mg by mouth daily.  Yes Historical Provider, MD  montelukast (SINGULAIR) 10 MG tablet TAKE 1 TABLET BY MOUTH ONCE DAILY 11/03/14  Yes Deneise Lever, MD  pantoprazole (PROTONIX) 40 MG tablet TAKE 1 TABLET BY MOUTH ONCE DAILY 06/27/13  Yes Jerene Bears, MD  thyroid (ARMOUR) 15 MG tablet Take 15 mg by mouth daily.   Yes Historical Provider, MD  Travoprost, BAK Free, (TRAVATAN) 0.004 % SOLN ophthalmic solution Place 1 drop into both eyes at bedtime.   Yes Historical Provider, MD  vitamin A 10000 UNIT capsule Take 10,000 Units by mouth daily.   Yes Historical Provider, MD  zolpidem (AMBIEN) 10 MG tablet Take 10 mg by mouth at bedtime.   Yes Historical Provider, MD   BP 128/69 mmHg  Pulse 55  Temp(Src) 98 F (36.7 C) (Oral)  Resp 17  SpO2 99%  LMP 12/12/2005 Physical Exam  Constitutional: She appears well-developed and well-nourished. No distress.  The patient appears slightly anxious.   HENT:  Head: Normocephalic and atraumatic.  Right Ear: External ear normal.  Left Ear: External ear normal.  Mouth/Throat: Oropharynx is clear and moist. No oropharyngeal exudate.   Eyes: Conjunctivae are normal. Pupils are equal, round, and reactive to light. Right eye exhibits no discharge. Left eye exhibits no discharge.  Neck: Neck supple.  Cardiovascular: Normal rate, regular rhythm, normal heart sounds and intact distal pulses.  Exam reveals no gallop and no friction rub.   No murmur heard. Heart rate 68. Bilateral radial pulses are intact.   Pulmonary/Chest: Effort normal and breath sounds normal. No respiratory distress. She has no wheezes. She has no rales. She exhibits no tenderness.  Abdominal: Soft. She exhibits no distension. There is no tenderness.  Musculoskeletal: She exhibits no edema.  No lower extremity edema. Patient is spontaneously moving all extremities in a coordinated fashion exhibiting good strength. Calves are non-tender to palpation.    Lymphadenopathy:    She has no cervical adenopathy.  Neurological: She is alert. Coordination normal.  Skin: Skin is warm and dry. No rash noted. She is not diaphoretic. No erythema. No pallor.  Psychiatric: Her behavior is normal. Her mood appears anxious.  Patient appears slightly anxious.   Nursing note and vitals reviewed.   ED Course  Procedures (including critical care time) Labs Review Labs Reviewed  CBC - Abnormal; Notable for the following:    RBC 3.68 (*)    Hemoglobin 11.8 (*)    HCT 35.0 (*)    All other components within normal limits  BASIC METABOLIC PANEL - Abnormal; Notable for the following:    Glucose, Bld 109 (*)    All other components within normal limits  I-STAT TROPOININ, ED    Imaging Review Dg Chest 2 View  11/17/2014   CLINICAL DATA:  Palpitations for 2 weeks, worsening, history COPD, bronchiectasis  EXAM: CHEST  2 VIEW  COMPARISON:  05/03/2012  FINDINGS: Enlargement of cardiac silhouette.  Mediastinal contours and pulmonary vascularity normal.  Lungs clear.  No pleural effusion or pneumothorax.  Bones unremarkable.  IMPRESSION: Mild enlargement of cardiac silhouette.  No  acute abnormalities.   Electronically Signed   By: Lavonia Dana M.D.   On: 11/17/2014 20:51     EKG Interpretation   Date/Time:  Monday November 17 2014 18:48:26 EST Ventricular Rate:  67 PR Interval:  141 QRS Duration: 88 QT Interval:  407 QTC Calculation: 430 R Axis:   89 Text Interpretation:  Sinus rhythm Low voltage, precordial leads No old  tracing to compare  Confirmed by KNAPP  MD-J, JON [phone]) on 11/17/2014  7:20:35 PM     Filed Vitals:   11/17/14 1851 11/17/14 1933 11/17/14 2120  BP: 167/72  128/69  Pulse:  67 55  Temp: 98 F (36.7 C)    TempSrc: Oral    Resp: 13  17  SpO2: 100%  99%    MDM   Meds given in ED:  Medications - No data to display  Discharge Medication List as of 11/17/2014  9:34 PM      Final diagnoses:  Palpitations   AVNEET ASHMORE is a 58 y.o. female with a history of hypertension, GERD, COPD, anxiety, depression who presents the ED complaining of palpitations intermittently for the past 3 weeks which have been worse today. Patient denies having any chest pain or shortness of breath. Patient reports feels like her heart is pounding in the ED. Patient is afebrile and nontoxic-appearing. The patient is an sinus rhythm on EKG. Patient is not tachycardic. The patient is not hypoxic. Patient had a normal TSH by her PCP.  Patient negative troponin. The patient's CBC and CMP are unremarkable. The patient's chest x-ray showed mild enlargement of her cardiac silhouette but was otherwise negative for acute abnormalities. I advised the patient to make an appointment with [facility] for follow-up with her palpitations. Advised patient to make a follow-up appointment with her primary care provider this week. Strict return precautions were provided.  Advised the patient to return to the emergency department with new or worsening symptoms, chest pain, shortness of breath or new concerns. The patient verbalized understanding and agreement  with plan.  This patient was discussed with Dr. Venora Maples who agrees with assessment and plan.    Hanley Hays, PA-C 11/18/14 [place], MD 11/18/14 [phone]

## 2014-11-17 NOTE — ED Notes (Signed)
Per pt, states heart palpitations on and off for months-states she is on FMLA from [employer] her PCP has been working her up for [employer]

## 2014-11-17 NOTE — ED Notes (Signed)
Bed: WA04 Expected date:  Expected time:  Means of arrival:  Comments: Hold for triage 2  

## 2014-11-17 NOTE — Discharge Instructions (Signed)

## 2014-11-25 ENCOUNTER — Encounter: Payer: Self-pay | Admitting: Cardiovascular Disease

## 2014-12-02 ENCOUNTER — Ambulatory Visit (INDEPENDENT_AMBULATORY_CARE_PROVIDER_SITE_OTHER): Payer: 59

## 2014-12-02 DIAGNOSIS — J309 Allergic rhinitis, unspecified: Secondary | ICD-10-CM

## 2014-12-09 ENCOUNTER — Telehealth: Payer: Self-pay | Admitting: Internal Medicine

## 2014-12-09 NOTE — Telephone Encounter (Signed)
Called, spoke with pt -  Pt c/o "extreme bouts with fatigue," feels like she "can't get breath," and can barely walk across the room (pt unsure if this is d/t weakness or SOB). Chest tightness present.  Feels feverish at times but does not have a thermometer to check temp.  No cough.  States symptoms started a few days after receiving flu vaccine on Sep 19, 2014. 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  Was then seen by PCP who, per pt, did not feel she had Randell Patient.  Pt states PCP then tested her for Lyme disease and sent labs for IgA, IgG, and IgM.  Pt reports results were all negative.  She is on LOA from work x 7-8 wks now.  Symptoms still present.  Pt has read from CDC that Lyme disease tests are not conclusive.  She lives in [place], [place] and [place] farm x [place] y[place] -- pt concerned she does have Lyme Disease.  Reports she has called ID in [place] who advised they do not treat Lyme Disease in [place].  She would like to be worked in with Dr. Annamaria Boots, if possible, to help figure out what is going on.  Pt also unsure if she should be starting with our office for help and would like to know if Dr. Annamaria Boots can provide guidance on this as well.  Dr. Annamaria Boots, pls advise.  Thank you.  Note:  Pt states she has been unable to come in for Allergy injections d/t symptoms and has been unable to drive.  She is unsure if she will make it in this coming Thursday for this.  Last OV with Dr. Annamaria Boots: 05/01/14; asked to f/u in 1 yr Was seen by TP on 10/02/14; asked to f/u with Dr. Annamaria Boots in 4 months Pending OV with Dr. Annamaria Boots: 05/01/2015  Allergies  Allergen Reactions  . Levaquin [Levofloxacin] Other (See Comments)    MUSCLE PAIN  . Nickel Rash     Current Outpatient Prescriptions on File Prior to Visit  Medication Sig Dispense Refill  . albuterol (PROVENTIL) (2.5 MG/3ML) 0.083% nebulizer solution Take 3 mLs (2.5 mg total) by nebulization every 6 (six) hours as needed for  wheezing or shortness of breath. 360 mL prn  . albuterol (VENTOLIN HFA) 108 (90 BASE) MCG/ACT inhaler Inhale 2 puffs into the lungs every 6 (six) hours as needed for wheezing or shortness of breath.     . ALPRAZolam (XANAX) 0.25 MG tablet Take 0.125-0.25 mg by mouth daily.    Marland Kitchen aspirin 81 MG chewable tablet Chew 162 mg by mouth daily.     . B Complex Vitamins (B-COMPLEX/B-12) LIQD Place 5 mLs under the tongue daily at 2 PM daily at 2 PM.    . Calcium-Magnesium-Zinc 167-83-8 MG TABS Take 1 tablet by mouth daily.    . Cholecalciferol (VITAMIN D) 2000 UNITS tablet Take 5,000 Units by mouth daily.     . fexofenadine (ALLEGRA) 180 MG tablet Take 180 mg by mouth daily.    Marland Kitchen ipratropium (ATROVENT) 0.02 % nebulizer solution Take 2.5 mLs (0.5 mg total) by nebulization every 6 (six) hours as needed for wheezing or shortness of breath. 360 mL prn  . ipratropium (ATROVENT) 0.03 % nasal spray Place 2 sprays into the nose every 12 (twelve) hours as needed for rhinitis.     Marland Kitchen ipratropium-albuterol (DUONEB) 0.5-2.5 (3) MG/3ML SOLN Take 3 mLs by nebulization every 4 (four)  hours as needed. (Patient taking differently: Take 3 mLs by nebulization every 4 (four) hours as needed (wheezing or SOB). ) 360 mL 3  . lisinopril (PRINIVIL,ZESTRIL) 5 MG tablet Take 5 mg by mouth daily.    . montelukast (SINGULAIR) 10 MG tablet TAKE 1 TABLET BY MOUTH ONCE DAILY 90 tablet 0  . pantoprazole (PROTONIX) 40 MG tablet TAKE 1 TABLET BY MOUTH ONCE DAILY 30 tablet 11  . thyroid (ARMOUR) 15 MG tablet Take 15 mg by mouth daily.    . Travoprost, BAK Free, (TRAVATAN) 0.004 % SOLN ophthalmic solution Place 1 drop into both eyes at bedtime.    . vitamin A 10000 UNIT capsule Take 10,000 Units by mouth daily.    Marland Kitchen zolpidem (AMBIEN) 10 MG tablet Take 10 mg by mouth at bedtime.     No current facility-administered medications on file prior to visit.

## 2014-12-09 NOTE — Telephone Encounter (Signed)
Ok to bring her in to see me at next open routine slot. Meanwhile she may want to talk with her doctor about whether referral to [facility] or to [facility] would be helpful.

## 2014-12-09 NOTE — Telephone Encounter (Signed)
Spoke with patient-she wants sooner appt; per CY patient can come in to see another MD if any sooner appts. Pt has been scheduled to see MW on Monday at 4:15pm.

## 2014-12-11 ENCOUNTER — Ambulatory Visit: Payer: 59

## 2014-12-15 ENCOUNTER — Ambulatory Visit (INDEPENDENT_AMBULATORY_CARE_PROVIDER_SITE_OTHER): Payer: 59

## 2014-12-15 ENCOUNTER — Encounter: Payer: Self-pay | Admitting: Internal Medicine

## 2014-12-15 ENCOUNTER — Ambulatory Visit (INDEPENDENT_AMBULATORY_CARE_PROVIDER_SITE_OTHER): Payer: 59 | Admitting: Internal Medicine

## 2014-12-15 VITALS — BP 140/76 | HR 70 | Temp 99.0°F | Ht 61.0 in | Wt 152.8 lb

## 2014-12-15 DIAGNOSIS — R053 Chronic cough: Secondary | ICD-10-CM

## 2014-12-15 DIAGNOSIS — J4531 Mild persistent asthma with (acute) exacerbation: Secondary | ICD-10-CM

## 2014-12-15 DIAGNOSIS — J309 Allergic rhinitis, unspecified: Secondary | ICD-10-CM

## 2014-12-15 DIAGNOSIS — R05 Cough: Secondary | ICD-10-CM

## 2014-12-15 MED ORDER — NEBULIZER COMPRESSOR MISC: Status: DC

## 2014-12-15 MED ORDER — NEBIVOLOL HCL 10 MG PO TABS: 10.0000 mg | ORAL_TABLET | Freq: Every day | ORAL | Status: DC

## 2014-12-15 MED ORDER — ALBUTEROL SULFATE HFA 108 (90 BASE) MCG/ACT IN AERS: INHALATION_SPRAY | RESPIRATORY_TRACT | Status: DC

## 2014-12-15 MED ORDER — PANTOPRAZOLE SODIUM 40 MG PO TBEC: 40.0000 mg | DELAYED_RELEASE_TABLET | Freq: Two times a day (BID) | ORAL | Status: DC

## 2014-12-15 MED ORDER — METHYLPREDNISOLONE ACETATE 80 MG/ML IJ SUSP
120.0000 mg | Freq: Once | INTRAMUSCULAR | Status: AC
  Administered 2014-12-15: 120 mg via INTRAMUSCULAR

## 2014-12-15 NOTE — Patient Instructions (Addendum)
Protonix 40 mg Take 30- 60 min before your first and last meals of the day  Depomedrol 120 IM today  Stop lisinopril and start bystolic 10 mg daily instead which should also help your palpitation   Work on inhaler technique:  relax and gently blow all the way out then take a nice smooth deep breath back in, triggering the inhaler at same time you start breathing in.  Hold for up to 5 seconds if you can.  Rinse and gargle with water when done     Only use your albuterol (proair or ventolin)as a rescue medication to be used if you can't catch your breath by resting or doing a relaxed purse lip breathing pattern.  - The less you use it, the better it will work when you need it. - Ok to use up to 2 puffs  every 4 hours if you must but call for immediate appointment if use goes up over your usual need but the goal is less than twice a week - Don't leave home without it !!  (think of it like the spare tire for your car)   For cough mucinex or mucinex dm as needed   If cough or breathing problems continue while off lisinopril but you will need to bring meds in two bags, maintenance and as needed.

## 2014-12-15 NOTE — Progress Notes (Signed)
Subjective:    Patient ID: [name] [name], female    DOB: [birth year],     MRN: [id number]  HPI 10/02/2014 Acute OV  19 yoF never smoker [name] [name]/ [name] [name]  referred by [name] [name] in [name] [name] , followed for allergic rhinitis, chronic bronchitis , and GERD  On allergy vaccine weekly . Remains on Allegra and singulair .  Presents for an acute office visit.  Complains of chills, body aches and scratchy sore throat for past 2 weeks..She saw eye doctor who told her to stop Nasonex due to increased optic pressure. She was told to try Astelin . Has been off Nasonex for 3 weeks.  Flu shot utd  Last cxr was in 03/2014 with PCP in [place], reported nml.  On Ace inhibitor .  rec Zpack take as directed.  Mucinex DM Twice daily  As needed  Cough/congestion .  Saline nasal rinses and gel As needed   Astelin 2 puffs At bedtime   Remain of Nasonex .  Fluids and rest  Would discuss with family doctor regarding Lisinopril -side effects of cough, may benefit from change   12/15/2014 acute ov/[name] [name] re:  Chronic Cough/ fatigue/ still on ACEi  Chief Complaint  Patient presents with  . Acute Visit    Pt of [name] [name]. Pt c/o extreme fatigue- esp in the am since had infulenza vaccine 10/8/5. She also c/o chest tightness.   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 No unusual exp hx or h/o childhood pna/ asthma or knowledge of premature birth.  Sleeping ok without nocturnal  or early am exacerbation  of respiratory  c/o's or need for noct saba. Also denies any obvious fluctuation of symptoms with weather or environmental changes or other aggravating or alleviating factors except  as outlined above   Current Medications, Allergies, Complete Past Medical History, Past Surgical History, Family History, and Social History were reviewed in [employer] record.  ROS  The following are not active complaints unless bolded sore throat, dysphagia, dental problems, itching, sneezing,  nasal congestion or excess/ purulent secretions, ear ache,   fever, chills, sweats, unintended wt loss, pleuritic or exertional cp, hemoptysis,  orthopnea pnd or leg swelling, presyncope, palpitations, heartburn, abdominal pain, anorexia, nausea, vomiting, diarrhea  or change in bowel or urinary habits, change in stools or urine, dysuria,hematuria,  rash, arthralgias, visual complaints, headache, numbness weakness or ataxia or problems with walking or coordination,  change in mood/affect or memory.               Objective:   Physical Exam GEN: A/Ox3; pleasant , NAD, well nourished / abn affect  Wt Readings from Last 3 Encounters:  12/15/14 152 lb 12.8 oz (69.31 kg)  10/02/14 152 lb (68.947 kg)  05/01/14 158 lb 9.6 oz (71.94 kg)    Vital signs reviewed   HEENT:  [place]/AT,  EACs-clear, TMs-wnl, NOSE-clear, drainage THROAT-clear, no lesions, no postnasal drip or exudate noted. ,   NECK:  Supple w/ fair ROM; no JVD; normal carotid impulses w/o bruits; no thyromegaly or nodules palpated; no lymphadenopathy.  frequent throat clearing noted during exam  RESP  Clear  P & A; w/o, wheezes/ rales/ or rhonchi.no accessory muscle use, no dullness to percussion  CARD:  RRR, no m/r/g  , no peripheral edema, pulses intact, no cyanosis or clubbing.  GI:   Soft & nt; nml bowel sounds; no organomegaly or masses detected.  Musco: Warm bil, no deformities or joint swelling noted.   Neuro: alert, no focal deficits noted.    Skin: Warm, no lesions or rashes         Assessment & Plan:

## 2014-12-16 ENCOUNTER — Other Ambulatory Visit (HOSPITAL_COMMUNITY): Payer: Self-pay | Admitting: Orthopedic Surgery

## 2014-12-16 DIAGNOSIS — M4802 Spinal stenosis, cervical region: Secondary | ICD-10-CM

## 2014-12-17 ENCOUNTER — Encounter: Payer: Self-pay | Admitting: Internal Medicine

## 2014-12-17 NOTE — Assessment & Plan Note (Signed)

## 2014-12-17 NOTE — Assessment & Plan Note (Addendum)
DDX of  difficult airways management all start with A and  include Adherence, Ace Inhibitors, Acid Reflux, Active Sinus Disease, Alpha 1 Antitripsin deficiency, Anxiety masquerading as Airways dz,  ABPA,  allergy(esp in young), Aspiration (esp in elderly), Adverse effects of DPI,  Active smokers, plus two Bs  = Bronchiectasis and Beta blocker use..and one C= CHF  Adherence is always the initial "prime suspect" and is a multilayered concern that requires a "trust but verify" approach in every patient - starting with knowing how to use medications, especially inhalers, correctly, keeping up with refills and understanding the fundamental difference between maintenance and prns vs those medications only taken for a very short course and then stopped and not refilled.  - not really clear what meds she takes regularly vs prn - The proper method of use, as well as anticipated side effects, of a metered-dose inhaler are discussed and demonstrated to the patient. 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  ACEi effects> only way to sort out is stop for now see hbp  ? Allergy > depomedrol 120 IM x one but no further steroids   ? Acid (or non-acid) GERD > always difficult to exclude as up to 75% of pts in some series report no assoc GI/ Heartburn symptoms> rec max (24h)  acid suppression and diet restrictions/ reviewed and instructions given in writing.   See instructions for specific recommendations which were reviewed directly with the patient who was given a copy with highlighter outlining the key components.

## 2014-12-18 ENCOUNTER — Ambulatory Visit (HOSPITAL_COMMUNITY)
Admission: RE | Admit: 2014-12-18 | Discharge: 2014-12-18 | Disposition: A | Discharge status: Home / Self Care | Payer: 59 | Source: Ambulatory Visit | Attending: Orthopedic Surgery | Admitting: Orthopedic Surgery

## 2014-12-18 DIAGNOSIS — M4802 Spinal stenosis, cervical region: Secondary | ICD-10-CM | POA: Insufficient documentation

## 2014-12-22 ENCOUNTER — Ambulatory Visit: Payer: 59

## 2014-12-23 ENCOUNTER — Ambulatory Visit (INDEPENDENT_AMBULATORY_CARE_PROVIDER_SITE_OTHER): Payer: 59

## 2014-12-23 DIAGNOSIS — J309 Allergic rhinitis, unspecified: Secondary | ICD-10-CM

## 2014-12-26 ENCOUNTER — Telehealth: Payer: Self-pay | Admitting: Internal Medicine

## 2014-12-26 NOTE — Telephone Encounter (Signed)
Offer prednisone 10 mg, # 20, 4 X 2 DAYS, 3 X 2 DAYS, 2 X 2 DAYS, 1 X 2 DAYS  

## 2014-12-26 NOTE — Telephone Encounter (Signed)
rx has been called to her pharmacy   i have called the pt and she  Is aware. Nothing further is needed.

## 2014-12-26 NOTE — Telephone Encounter (Signed)
Called and spoke with pt and she stated that she was seen by MW on 1-4 and was given depo shot.  She stated that this did help some but she is still having the allergy feeling, low grade fever, chest tightnes and she wanted to know if CY will call in a pred taper for her.  She has a pending appt with CY in feb 2016.  If so, will need [facility] [place] at [phone].  CY please advise. Thanks  Allergies  Allergen Reactions  . Levaquin [Levofloxacin] Other (See Comments)    MUSCLE PAIN  . Nickel Rash    Current Outpatient Prescriptions on File Prior to Visit  Medication Sig Dispense Refill  . albuterol (PROAIR HFA) 108 (90 BASE) MCG/ACT inhaler 2 puffs every 4 hours as needed only  if your can't catch your breath 1 Inhaler 1  . albuterol (PROVENTIL) (2.5 MG/3ML) 0.083% nebulizer solution Take 3 mLs (2.5 mg total) by nebulization every 6 (six) hours as needed for wheezing or shortness of breath. 360 mL prn  . albuterol (VENTOLIN HFA) 108 (90 BASE) MCG/ACT inhaler Inhale 2 puffs into the lungs every 6 (six) hours as needed for wheezing or shortness of breath.     . ALPRAZolam (XANAX) 0.25 MG tablet Take 0.125-0.25 mg by mouth daily.    . B Complex Vitamins (B-COMPLEX/B-12) LIQD Place 5 mLs under the tongue daily at 2 PM daily at 2 PM.    . Cholecalciferol (VITAMIN D) 2000 UNITS tablet Take 5,000 Units by mouth daily.     Marland Kitchen doxycycline (VIBRA-TABS) 100 MG tablet Take 100 mg by mouth 2 (two) times daily.    . fexofenadine (ALLEGRA) 180 MG tablet Take 180 mg by mouth daily.    Marland Kitchen ipratropium (ATROVENT) 0.02 % nebulizer solution Take 2.5 mLs (0.5 mg total) by nebulization every 6 (six) hours as needed for wheezing or shortness of breath. 360 mL prn  . ipratropium (ATROVENT) 0.03 % nasal spray Place 2 sprays into the nose every 12 (twelve) hours as needed for rhinitis.     Marland Kitchen ipratropium-albuterol (DUONEB) 0.5-2.5 (3) MG/3ML SOLN Take 3 mLs by nebulization every 4 (four) hours as needed.  (Patient taking differently: Take 3 mLs by nebulization every 4 (four) hours as needed (wheezing or SOB). ) 360 mL 3  . levothyroxine (SYNTHROID, LEVOTHROID) 25 MCG tablet Take 25 mcg by mouth daily before breakfast.    . liothyronine (CYTOMEL) 5 MCG tablet Take 5 mcg by mouth daily.    . magnesium oxide (MAG-OX) 400 MG tablet Take 800 mg by mouth daily.    . metroNIDAZOLE (FLAGYL) 500 MG tablet Take 500 mg by mouth 2 (two) times daily.    . montelukast (SINGULAIR) 10 MG tablet TAKE 1 TABLET BY MOUTH ONCE DAILY 90 tablet 0  . nebivolol (BYSTOLIC) 10 MG tablet Take 1 tablet (10 mg total) by mouth daily.    . Nebulizers (NEBULIZER COMPRESSOR) MISC Use as directed 1 each 0  . pantoprazole (PROTONIX) 40 MG tablet Take 1 tablet (40 mg total) by mouth 2 (two) times daily before a meal.    . Travoprost, BAK Free, (TRAVATAN) 0.004 % SOLN ophthalmic solution Place 1 drop into both eyes at bedtime.    . vitamin A 10000 UNIT capsule Take 10,000 Units by mouth daily.    Marland Kitchen zolpidem (AMBIEN) 10 MG tablet Take 10 mg by mouth at bedtime.     No current facility-administered medications on file prior to visit.

## 2014-12-30 ENCOUNTER — Ambulatory Visit (INDEPENDENT_AMBULATORY_CARE_PROVIDER_SITE_OTHER): Payer: 59

## 2014-12-30 ENCOUNTER — Telehealth: Payer: Self-pay | Admitting: Internal Medicine

## 2014-12-30 DIAGNOSIS — J309 Allergic rhinitis, unspecified: Secondary | ICD-10-CM

## 2014-12-30 NOTE — Telephone Encounter (Signed)
CY are you okay with [place] scheduling patient in for allergy skin testing for 01-2015?  Looks as though patient was last tested 11-10-2010 by Dr Marcello Moores O'Neill's office in [place]. Thanks.

## 2014-12-30 NOTE — Telephone Encounter (Signed)
Okay to schedule allergy testing with usual instructions about avoidance of all antihistamines

## 2014-12-31 NOTE — Telephone Encounter (Signed)
Spoke with patient-aware to be here on Wednesday 01-14-15 at 2:00pm for skin testing. Pt was told verbally the allergy skin testing protocol. Also, patient requested a sooner appt date and time with CY for usual follow up-pt has been schedule for Tuesday 01-06-15 at 10:15am and will get allergy skin testing protocol sheet then. Nothing more needed at this time.

## 2015-01-01 ENCOUNTER — Encounter: Payer: Self-pay | Admitting: Cardiovascular Disease

## 2015-01-01 ENCOUNTER — Ambulatory Visit (INDEPENDENT_AMBULATORY_CARE_PROVIDER_SITE_OTHER): Payer: 59 | Admitting: Cardiovascular Disease

## 2015-01-01 VITALS — BP 156/84 | HR 60 | Ht 61.0 in | Wt 152.0 lb

## 2015-01-01 DIAGNOSIS — E039 Hypothyroidism, unspecified: Secondary | ICD-10-CM | POA: Insufficient documentation

## 2015-01-01 DIAGNOSIS — R002 Palpitations: Secondary | ICD-10-CM | POA: Insufficient documentation

## 2015-01-01 DIAGNOSIS — R0989 Other specified symptoms and signs involving the circulatory and respiratory systems: Secondary | ICD-10-CM | POA: Insufficient documentation

## 2015-01-01 DIAGNOSIS — R0600 Dyspnea, unspecified: Secondary | ICD-10-CM

## 2015-01-01 NOTE — Patient Instructions (Addendum)
Your physician recommends that you continue on your current medications as directed. Please refer to the Current Medication list given to you today.  Your physician has requested that you have an abdominal aorta duplex. During this test, an ultrasound is used to evaluate the aorta. Allow 30 minutes for this exam. Do not eat after midnight the day before and avoid carbonated beverages  Your physician has requested that you have an echocardiogram. Echocardiography is a painless test that uses sound waves to create images of your heart. It provides your doctor with information about the size and shape of your heart and how well your heart's chambers and valves are working. This procedure takes approximately one hour. There are no restrictions for this procedure.  Your physician recommends that you schedule a follow-up appointment in: as needed with Dr. Acie Fredrickson

## 2015-01-01 NOTE — Progress Notes (Signed)
Cardiology Office Note   Date:  01/01/2015   ID:  [name] [name], [name] [name] 1956-02-02, MRN [id number]  PCP:  Talmage Coin, MD  Cardiologist:   Thayer Headings, MD   Chief Complaint  Patient presents with  . Chest Pain    New Patient      History of Present Illness: [name] [name] is a 59 y.o. female who presents for evaluation of palpitations.    1. HTN 2. Asthma 3. Chest pressure 4. Hypothyroidism  She has been seen by Dr. Quincy Simmonds.   She has been sick since Oct. After getting a flu shot. She has cold intolerence.   In mid November, she started getting some palpitations.   She went out on FMLA in [facility] November.   She feels like her heart is "bounding "    She has had extreme fatigue  She gets winded with exertion , but no real shortness of breath.   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 Lots of exposure to tics. At least 4-6 bites this summer.   Glass of wine in the evenings .  Tried cutting out her wine for 3 weeks, - this did not change the heart bounding.   Past Medical History  Diagnosis Date  . Bronchiectasis   . COPD (chronic obstructive pulmonary disease)   . GERD (gastroesophageal reflux disease)   . Bronchitis     chronic  . PONV (postoperative nausea and vomiting)   . Hx of migraines   . H/O hiatal hernia     "sliding"  . Osteoarthritis     knees  . Positional headache since 1997    if lies on left side  . Depression   . Anxiety   . 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 Dental crown present     upper  . Abrasion of wrist, left 03/12/2013  . Environmental allergies     receives allergy shots  . Glaucoma high risk     is monitored every 6 mos. - no current med.  . Thyroid disease     hypothyroid    Past Surgical History  Procedure Laterality Date  . Tonsillectomy and adenoidectomy      age 20  . Tubal ligation  1986  .  Colonoscopy    . Wisdom tooth extraction    . Knee arthroscopy  10/19/2012    Procedure: ARTHROSCOPY KNEE;  Surgeon: Yvette Rack., MD;  Location: [facility];  Service: Orthopedics;  Laterality: Right;  . Knee arthroscopy with lateral menisectomy  10/19/2012    Procedure: KNEE ARTHROSCOPY WITH LATERAL MENISECTOMY;  Surgeon: Yvette Rack., MD;  Location: [facility];  Service: Orthopedics;  Laterality: Right;  . Chondroplasty  10/19/2012    Procedure: CHONDROPLASTY;  Surgeon: Yvette Rack., MD;  Location: [facility];  Service: Orthopedics;  Laterality: Right;  . Knee arthroscopy Right 02/01/2013    Procedure: RIGHT KNEE ARTHROSCOPY WITH MEDIAL MENISCECTOMY, DEBRIDEMENT CHONDROMALACIA PATELLA;  Surgeon: Yvette Rack., MD;  Location: [facility];  Service: Orthopedics;  Laterality: Right;  RIGHT KNEE ARTHROSCOPY WITH MEDIAL MENISCECTOMY, DEBRIDEMENT CHONDROMALACIA PATELLA  . Cholecystectomy  04/21/2011  . Knee arthroscopy Left 03/15/2013    Procedure: LEFT KNEE ARTHROSCOPY WITH DEBRIDEMENT/SHAVING CHONDROPLASTY WITH MEDIAL MENISECTOMY;  Surgeon: Yvette Rack., MD;  Location: [facility];  Service: Orthopedics;  Laterality: Left;     Current Outpatient Prescriptions  Medication Sig Dispense Refill  . albuterol (PROAIR HFA) 108 (90 BASE) MCG/ACT inhaler 2 puffs every 4 hours as needed only  if your can't catch your breath 1 Inhaler 1  . albuterol (PROVENTIL) (2.5 MG/3ML) 0.083% nebulizer solution Take 3 mLs (2.5 mg total) by nebulization every 6 (six) hours as needed for wheezing or shortness of breath. 360 mL prn  . albuterol (VENTOLIN HFA) 108 (90 BASE) MCG/ACT inhaler Inhale 2 puffs into the lungs every 6 (six) hours as needed for wheezing or shortness of breath.     . ALPRAZolam (XANAX) 0.25 MG tablet Take 0.125-0.25 mg by mouth daily. As needed anxiety    . B Complex Vitamins (B-COMPLEX/B-12) LIQD Place 5 mLs under the  tongue daily at 2 PM daily at 2 PM.    . Cholecalciferol (VITAMIN D) 2000 UNITS tablet Take 5,000 Units by mouth daily.     . Iodine, [place], TABS Take 1 tablet by mouth daily.    Marland Kitchen ipratropium (ATROVENT) 0.02 % nebulizer solution Take 2.5 mLs (0.5 mg total) by nebulization every 6 (six) hours as needed for wheezing or shortness of breath. 360 mL prn  . ipratropium (ATROVENT) 0.03 % nasal spray Place 2 sprays into the nose every 12 (twelve) hours as needed for rhinitis.     Marland Kitchen ipratropium-albuterol (DUONEB) 0.5-2.5 (3) MG/3ML SOLN Take 3 mLs by nebulization every 4 (four) hours as needed. (Patient taking differently: Take 3 mLs by nebulization every 4 (four) hours as needed (wheezing or SOB). ) 360 mL 3  . levothyroxine (SYNTHROID, LEVOTHROID) 25 MCG tablet Take 25 mcg by mouth daily before breakfast.    . liothyronine (CYTOMEL) 5 MCG tablet Take 5 mcg by mouth daily.    . magnesium oxide (MAG-OX) 400 MG tablet Take 800 mg by mouth daily.    . montelukast (SINGULAIR) 10 MG tablet TAKE 1 TABLET BY MOUTH ONCE DAILY 90 tablet 0  . Multiple Vitamins-Minerals (MULTIVITAMIN ADULT PO) Take 1 tablet by mouth daily.    . nebivolol (BYSTOLIC) 10 MG tablet Take 1 tablet (10 mg total) by mouth daily.    . Nebulizers (NEBULIZER COMPRESSOR) MISC Use as directed 1 each 0  . pantoprazole (PROTONIX) 40 MG tablet Take 1 tablet (40 mg total) by mouth 2 (two) times daily before a meal.    . predniSONE (DELTASONE) 10 MG tablet Take 10 mg by mouth daily. Take 4 tablets by mouth once a day with food for 2 days, then 3 for 2, then 2 for 2, then 1 for 2.  0  . Probiotic Product (PROBIOTIC DAILY) CAPS Take 1 capsule by mouth daily.    . Travoprost, BAK Free, (TRAVATAN) 0.004 % SOLN ophthalmic solution Place 1 drop into both eyes at bedtime.    . vitamin A 10000 UNIT capsule Take 10,000 Units by mouth daily.    Marland Kitchen zolpidem (AMBIEN) 10 MG tablet Take 10 mg by mouth at bedtime.     No current facility-administered medications  for this visit.    Allergies:   Levaquin and Nickel    Social History:  The patient  reports that she has never smoked. She has never used smokeless tobacco. She reports that she drinks about 1.0 oz of alcohol per week. She reports that she does not use illicit drugs.   Family History:  The patient's family history includes COPD (age of onset: 40) in her brother;  Emphysema in her father; Hypertension in her sister; Lung cancer (age of onset: 16) in her father; Stroke in her mother; Thyroid disease in her sister.    ROS:  Please see the history of present illness.   Otherwise, review of systems are positive for none.   All other systems are reviewed and negative.    PHYSICAL EXAM: VS:  BP 156/84 mmHg  Pulse 60  Ht 5\' 1"  (1.549 m)  Wt 152 lb (68.947 kg)  BMI 28.74 kg/m2  SpO2 99%  LMP 12/12/2005 , BMI Body mass index is 28.74 kg/(m^2). GEN: Well nourished, well developed, in no acute distress HEENT: normal Neck: no JVD, carotid bruits, or masses Cardiac: RRR; no murmurs, rubs, or gallops,no edema  Respiratory:  clear to auscultation bilaterally, normal work of breathing GI: soft, , palpible abdomina aorta,  No bruits MS: no deformity or atrophy Skin: warm and dry, no rash Neuro:  Strength and sensation are intact Psych: euthymic mood, full affect   EKG:  EKG is not ordered today. The ekg ordered today demonstrates    Recent Labs: 11/17/2014: BUN 13; Creatinine 0.68; Hemoglobin 11.8*; Platelets 326; Potassium 4.2; Sodium 144    Lipid Panel No results found for: CHOL, TRIG, HDL, CHOLHDL, VLDL, LDLCALC, LDLDIRECT    Wt Readings from Last 3 Encounters:  01/01/15 152 lb (68.947 kg)  12/15/14 152 lb 12.8 oz (69.31 kg)  10/02/14 152 lb (68.947 kg)      Other studies Reviewed: Additional studies/ records that were reviewed today include: holter monitor. Review of the above records demonstrates:  Normal sinus rhythm    ASSESSMENT AND PLAN:  1.  Heart palpitations: The  patient complains of a bounding heart sensation. Her Holter monitors completely normal. She has a bounding heart even despite having a normal cardiac exam. She's had some thyroid issues.  2. Pulsatile abdominal aorta: Unable to palpate her abdominal aorta. We'll get an abdominal duplex scan for further evaluation.  3. Hypothyroidism: Patient has had some thyroid medication adjustment. I suggested that she see her endocrinologist for further evaluation of her thyroid.  4. Multiple tick bites: This may be the ultimate etiology of her symptoms .  She has  had multiple tick bites. There's been a lot of recent study on the affect  of tick bites and subsequent allergic reactions. Will rule out that I was not able to contribute any further to this discussion.   Current medicines are reviewed at length with the patient today.  The patient does not have concerns regarding medicines.  The following changes have been made:  no change  Labs/ tests ordered today include: echo, abdominal duplex   No orders of the defined types were placed in this encounter.     Disposition:   FU with me as needed.    Signed, Alese Furniss, Wonda Cheng, MD  01/01/2015 10:13 AM    [address], [place], [place]  [address] Phone: [phone]; Fax: [phone]

## 2015-01-02 ENCOUNTER — Ambulatory Visit: Payer: 59 | Admitting: Cardiovascular Disease

## 2015-01-06 ENCOUNTER — Ambulatory Visit: Payer: 59 | Admitting: Internal Medicine

## 2015-01-06 ENCOUNTER — Encounter: Payer: Self-pay | Admitting: Internal Medicine

## 2015-01-06 ENCOUNTER — Ambulatory Visit (HOSPITAL_BASED_OUTPATIENT_CLINIC_OR_DEPARTMENT_OTHER): Payer: 59 | Admitting: Cardiology

## 2015-01-06 ENCOUNTER — Other Ambulatory Visit (INDEPENDENT_AMBULATORY_CARE_PROVIDER_SITE_OTHER): Payer: 59

## 2015-01-06 ENCOUNTER — Ambulatory Visit (INDEPENDENT_AMBULATORY_CARE_PROVIDER_SITE_OTHER): Payer: 59

## 2015-01-06 ENCOUNTER — Ambulatory Visit (INDEPENDENT_AMBULATORY_CARE_PROVIDER_SITE_OTHER): Payer: 59 | Admitting: Internal Medicine

## 2015-01-06 ENCOUNTER — Ambulatory Visit (HOSPITAL_COMMUNITY): Payer: 59 | Attending: Cardiology | Admitting: Cardiology

## 2015-01-06 ENCOUNTER — Encounter (HOSPITAL_COMMUNITY): Payer: 59

## 2015-01-06 ENCOUNTER — Ambulatory Visit: Payer: 59

## 2015-01-06 VITALS — BP 132/80 | HR 54 | Ht 61.0 in | Wt 151.8 lb

## 2015-01-06 DIAGNOSIS — R0600 Dyspnea, unspecified: Secondary | ICD-10-CM | POA: Insufficient documentation

## 2015-01-06 DIAGNOSIS — J309 Allergic rhinitis, unspecified: Secondary | ICD-10-CM

## 2015-01-06 DIAGNOSIS — I10 Essential (primary) hypertension: Secondary | ICD-10-CM | POA: Insufficient documentation

## 2015-01-06 DIAGNOSIS — R002 Palpitations: Secondary | ICD-10-CM | POA: Insufficient documentation

## 2015-01-06 DIAGNOSIS — J4530 Mild persistent asthma, uncomplicated: Secondary | ICD-10-CM

## 2015-01-06 DIAGNOSIS — J3089 Other allergic rhinitis: Secondary | ICD-10-CM

## 2015-01-06 DIAGNOSIS — R0989 Other specified symptoms and signs involving the circulatory and respiratory systems: Secondary | ICD-10-CM | POA: Insufficient documentation

## 2015-01-06 DIAGNOSIS — J45909 Unspecified asthma, uncomplicated: Secondary | ICD-10-CM

## 2015-01-06 DIAGNOSIS — J302 Other seasonal allergic rhinitis: Secondary | ICD-10-CM

## 2015-01-06 LAB — COMPREHENSIVE METABOLIC PANEL
ALT: 22 U/L (ref 0–35)
AST: 15 U/L (ref 0–37)
Albumin: 4 g/dL (ref 3.5–5.2)
Alkaline Phosphatase: 57 U/L (ref 39–117)
BUN: 20 mg/dL (ref 6–23)
CO2: 30 mEq/L (ref 19–32)
Calcium: 9.2 mg/dL (ref 8.4–10.5)
Chloride: 103 mEq/L (ref 96–112)
Creatinine, Ser: 0.69 mg/dL (ref 0.40–1.20)
GFR: 92.46 mL/min (ref >60.00)
Glucose, Bld: 98 mg/dL (ref 70–99)
Potassium: 4.1 mEq/L (ref 3.5–5.1)
Sodium: 141 mEq/L (ref 135–145)
Total Bilirubin: 0.5 mg/dL (ref 0.2–1.2)
Total Protein: 6.6 g/dL (ref 6.0–8.3)

## 2015-01-06 LAB — SEDIMENTATION RATE: Sed Rate: 12 mm/hr (ref 0–22)

## 2015-01-06 LAB — CBC WITH DIFFERENTIAL/PLATELET
Basophils Absolute: 0 10*3/uL (ref 0.0–0.1)
Basophils Relative: 0.4 % (ref 0.0–3.0)
Eosinophils Absolute: 0.1 10*3/uL (ref 0.0–0.7)
Eosinophils Relative: 0.8 % (ref 0.0–5.0)
HCT: 38 % (ref 36.0–46.0)
Hemoglobin: 13.2 g/dL (ref 12.0–15.0)
Lymphocytes Relative: 45.2 % (ref 12.0–46.0)
Lymphs Abs: 3.7 10*3/uL (ref 0.7–4.0)
MCHC: 34.7 g/dL (ref 30.0–36.0)
MCV: 93.8 fl (ref 78.0–100.0)
Monocytes Absolute: 0.6 10*3/uL (ref 0.1–1.0)
Monocytes Relative: 7.6 % (ref 3.0–12.0)
Neutro Abs: 3.7 10*3/uL (ref 1.4–7.7)
Neutrophils Relative %: 46 % (ref 43.0–77.0)
Platelets: 317 10*3/uL (ref 150.0–400.0)
RBC: 4.05 Mil/uL (ref 3.87–5.11)
RDW: 13.3 % (ref 11.5–15.5)
WBC: 8.1 10*3/uL (ref 4.0–10.5)

## 2015-01-06 LAB — C-REACTIVE PROTEIN: CRP: <0.1 mg/dL — ABNORMAL LOW (ref 0.5–20.0)

## 2015-01-06 LAB — CORTISOL: Cortisol, Plasma: 1.4 ug/dL

## 2015-01-06 NOTE — Progress Notes (Signed)
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 She is followed here by Dr [name] [name] for Cough/ obstructive lung disease/ chronic bronchitis. Patient states doing much better since last visit. stillc c/o drainage and cough. Patient states [name] sample helped with drainage. Also, c/o itching at the 0.1 dosage of the allergy vaccine and states the .05 dosage did ok.  Has not needed prednisone or Biaxin given at our last visit. No acute exacerbations. Vaccine volume was held at 0.05 mL from her Silver vial through August. [name]. Allergy Profile 06/22/2012 total IgE 26.2/negative for specific elevations CBC 06/22/2012-WBC 6000 with normal differential Immunoglobulins 06/22/2012- IgG normal 929, IgA normal 153, IgM low 42 (52-322). Discussed with her.  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 She is followed here by Dr [name] [name] for Cough/ obstructive lung disease/ chronic bronchitis. [name]. [name] 1:50 . Does have PND and cough with light mucus.  Did very well until the month of July and then began having increased nasal congestion. This would not have been the spring pollen season. She got a live chicken in April. Wakes coughing. Using Nasonex and ipratropium nasal sprays with daily Allegra.  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 She is followed here by Dr [name] [name] for Cough/ obstructive lung disease/ chronic bronchitis, complicated by GERD. C/o headache, PND, body ache, fatigue, cough with out mucus production, mild SOB with moderate activity, and mild chest tightness d/t chest congestion per pt. Symptoms occuring x 1 week. She has been outdoors a lot a week ago and blames that exposure for  sinus headache, burning eyes-now mostly chest tightness and cough. She continues allergy vaccine 1:50 0.3 ML's GH, admitting she misses occasional dose. Actively remodeling home with associated dust exposure.  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 Remains on Allegra and singulair .  Presents for an acute office visit.  Complains of chills, body aches and scratchy sore throat for past 2 weeks..She saw eye doctor who told her to stop Nasonex due to increased optic pressure. She was told to try Astelin . Has been off Nasonex for 3 weeks.  Flu shot utd  Last cxr was in 03/2014 with PCP in [place], reported nml.  On Ace inhibitor .  rec Zpack take as directed.  Mucinex DM Twice daily  As needed  Cough/congestion .  Saline nasal rinses and gel As needed   Astelin 2 puffs At bedtime   Remain of Nasonex .  Fluids and rest  Would discuss with family doctor regarding Lisinopril -side effects of cough, may benefit from change   12/15/2014 acute ov/Wert re:  Chronic Cough/ fatigue/ still on ACEi  Chief Complaint  Patient presents with  . Acute Visit    Pt of Dr [name] [name]. Pt c/o extreme fatigue- esp in the am since had infulenza vaccine 10/8/5. She also c/o chest tightness.   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 No unusual exp hx or h/o childhood pna/ asthma or knowledge of premature birth. Sleeping ok without nocturnal  or early am exacerbation  of respiratory  c/o's or need for noct saba. 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 She has been followed here by Dr [name] [name] for Cough/ obstructive lung disease/ chronic bronchitis, complicated by GERD. Pt is currently on FMLA due to possible lyme disease, not sure yet what diagnosis is.  Pt says she has been sick every since she had her flu shot on 09/19/2014.     Allergy vaccine 1:10 GH  scheduled for update allergy skin testing next week. She has been seen here a couple of times for ongoing problem-3 days after her flu shot in October she woke during the night with severe myalgias, fever. Since then has felt hot and cold, fatigue. Has worked closely with her primary physician and with a dermatologist/alternative medicine provider. Initial concern of Lyme disease. Out of work since November 16 because of this illness. Now feels she is gradually getting a little better. Coughing much less. Myalgias have largely resolved. Began improving after she was treated with doxycycline and Flagyl for Lyme disease-connection unclear. Still feels tired and intermittently chilled to the bone and flushed. Saw no benefit from Depo-Medrol prednisone taper. Changed from lisinopril to a beta blocker. Thyroid replacement pending assessment by Dr. Ellison/endocrinology next week.  ROS-see HPI Constitutional:   No-   weight loss, night sweats, +fevers, chills,+ fatigue, lassitude. HEENT:   No-  headaches, difficulty swallowing, tooth/dental problems, sore throat,       No-  sneezing, itching, ear ache, nasal congestion, post nasal drip,  CV:  No-   chest pain, orthopnea, PND, swelling in lower extremities, anasarca,                                  dizziness, palpitations Resp: No-   shortness of breath with exertion or at rest.              No-   productive cough,  No  non-productive cough,  No- coughing up of blood.              No-   change in color of mucus.  No- wheezing.   Skin: No-   rash or lesions. GI:  No-   heartburn, indigestion, abdominal pain, nausea, vomiting,  GU:  MS:  No-   joint pain or swelling.   Neuro-     nothing unusual Psych:  No- change in mood or affect. No depression or anxiety.  No memory loss.    Objective:  OBJ- Physical Exam  +looks tired/depressed General- Alert, Oriented, Affect-appropriate, Distress- none acute Skin- rash-none, lesions- none, excoriation- none Lymphadenopathy- none Head- atraumatic            Eyes- Gross vision intact, PERRLA, conjunctivae and secretions clear            Ears- Hearing, canals-normal  Nose- Clear, no-Septal dev, mucus, polyps, erosion, perforation             Throat- Mallampati II , mucosa clear , drainage- none, tonsils- atrophic Neck- flexible , trachea midline, no stridor , thyroid nl, carotid no bruit Chest - symmetrical excursion , unlabored           Heart/CV- RRR , no murmur , no gallop  , no rub, nl s1 s2                           - JVD- none , edema- none, stasis changes- none, varices- none           Lung- clear to P&A, wheeze- none, cough- none , dullness-none, rub- none           Chest wall-  Abd-  Br/ Gen/ Rectal- Not done, not indicated Extrem- cyanosis- none, clubbing, none, atrophy- none, strength- nl Neuro- grossly intact to observation         Assessment & Plan:

## 2015-01-06 NOTE — Assessment & Plan Note (Signed)
Consider if any of her complaints of fatigue are due to beta blocker with heart rate 54

## 2015-01-06 NOTE — Assessment & Plan Note (Signed)
Bronchitis component is actually not evident now, with cough substantially resolved. She is describing nonspecific inflammatory symptoms that might be viral. Role of depression, endocrinopathy unclear. It is good that she will be seeing Dr. Loanne Drilling. We can draw a random cortisol for his use. Plan-CBC with differential, comprehensive chemistry, random cortisol. Keep pending appointments

## 2015-01-06 NOTE — Progress Notes (Signed)
Abdominal Aorta Duplex performed  

## 2015-01-06 NOTE — Patient Instructions (Addendum)
Order- labs-CBC w diff, CMET, CRP, Sed rate, random cortisol   Dx asthmatic bronchitis   Keep appointment for allergy skin testing

## 2015-01-06 NOTE — Assessment & Plan Note (Signed)
She is concerned about dogs in her home, dust from gravel driveway home environment does not sound like a serious problem. We have already scheduled follow-up skin testing at her request and she will keep that appointment. Meanwhile she continues allergy vaccine which has been well-tolerated.

## 2015-01-06 NOTE — Progress Notes (Signed)
Echo performed. 

## 2015-01-12 ENCOUNTER — Ambulatory Visit (INDEPENDENT_AMBULATORY_CARE_PROVIDER_SITE_OTHER): Payer: 59 | Admitting: Endocrinology

## 2015-01-12 ENCOUNTER — Encounter: Payer: Self-pay | Admitting: Endocrinology

## 2015-01-12 VITALS — BP 132/84 | HR 59 | Temp 98.8°F | Ht 63.0 in | Wt 153.0 lb

## 2015-01-12 DIAGNOSIS — E039 Hypothyroidism, unspecified: Secondary | ICD-10-CM

## 2015-01-12 LAB — T3, FREE: T3, Free: 3.6 pg/mL (ref 2.3–4.2)

## 2015-01-12 LAB — TSH: TSH: 1.42 u[IU]/mL (ref 0.35–4.50)

## 2015-01-12 LAB — T4, FREE: Free T4: 0.75 ng/dL (ref 0.60–1.60)

## 2015-01-12 NOTE — Progress Notes (Signed)
Subjective:    Patient ID: [name] [name], female    DOB: 05-18-1956, 59 y.o.   MRN: [id number]  HPI Pt reports hypothyroidism was dx'ed in 2014.  she has been on prescribed thyroid hormone therapy since then.  she has never taken kelp or any other type of non-prescribed thyroid product.  she has never had thyroid surgery, or XRT to the neck.  she has never been on amiodarone or lithium.    She presented with severe cold intolerance throughout the body, and assoc fatigue.  sxs are improved on thyroid hormone rx, but not resolved.   Past Medical History  Diagnosis Date  . Bronchiectasis   . COPD (chronic obstructive pulmonary disease)   . GERD (gastroesophageal reflux disease)   . Bronchitis     chronic  . PONV (postoperative nausea and vomiting)   . Hx of migraines   . H/O hiatal hernia     "sliding"  . Osteoarthritis     knees  . Positional headache since 1997    if lies on left side  . Depression   . Anxiety   . 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 Dental crown present     upper  . Abrasion of wrist, left 03/12/2013  . Environmental allergies     receives allergy shots  . Glaucoma high risk     is monitored every 6 mos. - no current med.  . Thyroid disease     hypothyroid    Past Surgical History  Procedure Laterality Date  . Tonsillectomy and adenoidectomy      age 45  . Tubal ligation  1986  . Colonoscopy    . Wisdom tooth extraction    . Knee arthroscopy  10/19/2012    Procedure: ARTHROSCOPY KNEE;  Surgeon: Yvette Rack., MD;  Location: [facility];  Service: Orthopedics;  Laterality: Right;  . Knee arthroscopy with lateral menisectomy  10/19/2012    Procedure: KNEE ARTHROSCOPY WITH LATERAL MENISECTOMY;  Surgeon: Yvette Rack., MD;  Location: [facility];  Service: Orthopedics;  Laterality: Right;  . Chondroplasty  10/19/2012    Procedure: CHONDROPLASTY;  Surgeon: Yvette Rack., MD;  Location: [facility];   Service: Orthopedics;  Laterality: Right;  . Knee arthroscopy Right 02/01/2013    Procedure: RIGHT KNEE ARTHROSCOPY WITH MEDIAL MENISCECTOMY, DEBRIDEMENT CHONDROMALACIA PATELLA;  Surgeon: Yvette Rack., MD;  Location: [facility];  Service: Orthopedics;  Laterality: Right;  RIGHT KNEE ARTHROSCOPY WITH MEDIAL MENISCECTOMY, DEBRIDEMENT CHONDROMALACIA PATELLA  . Cholecystectomy  04/21/2011  . Knee arthroscopy Left 03/15/2013    Procedure: LEFT KNEE ARTHROSCOPY WITH DEBRIDEMENT/SHAVING CHONDROPLASTY WITH MEDIAL MENISECTOMY;  Surgeon: Yvette Rack., MD;  Location: [facility];  Service: Orthopedics;  Laterality: Left;    History   Social History  . Marital Status: Married    Spouse Name: N/A    Number of Children: N/A  . Years of Education: N/A   Occupational History  . nurse  [employer]   Social History Main Topics  . Smoking status: Never Smoker   . Smokeless tobacco: Never Used  . Alcohol Use: 1.0 oz/week    2 drink(s) per week     Comment: occasionally  . Drug Use: No  . Sexual Activity:    Partners: Male    Birth Control/ Protection: Post-menopausal   Other Topics Concern  . Not on file   Social History Narrative  Current Outpatient Prescriptions on File Prior to Visit  Medication Sig Dispense Refill  . albuterol (PROAIR HFA) 108 (90 BASE) MCG/ACT inhaler 2 puffs every 4 hours as needed only  if your can't catch your breath 1 Inhaler 1  . albuterol (PROVENTIL) (2.5 MG/3ML) 0.083% nebulizer solution Take 3 mLs (2.5 mg total) by nebulization every 6 (six) hours as needed for wheezing or shortness of breath. 360 mL prn  . albuterol (VENTOLIN HFA) 108 (90 BASE) MCG/ACT inhaler Inhale 2 puffs into the lungs every 6 (six) hours as needed for wheezing or shortness of breath.     . ALPRAZolam (XANAX) 0.25 MG tablet Take 0.125-0.25 mg by mouth daily. As needed anxiety    . azelastine (ASTELIN) 0.1 % nasal spray Place into both nostrils 2 (two) times  daily. Use in each nostril as directed    . B Complex Vitamins (B-COMPLEX/B-12) LIQD Place 5 mLs under the tongue daily at 2 PM daily at 2 PM.    . Cholecalciferol (VITAMIN D) 2000 UNITS tablet Take 5,000 Units by mouth daily.     . Iodine, Kelp, TABS Take 1 tablet by mouth daily.    Marland Kitchen ipratropium (ATROVENT) 0.02 % nebulizer solution Take 2.5 mLs (0.5 mg total) by nebulization every 6 (six) hours as needed for wheezing or shortness of breath. 360 mL prn  . ipratropium (ATROVENT) 0.03 % nasal spray Place 2 sprays into the nose every 12 (twelve) hours as needed for rhinitis.     Marland Kitchen ipratropium-albuterol (DUONEB) 0.5-2.5 (3) MG/3ML SOLN Take 3 mLs by nebulization every 4 (four) hours as needed. (Patient taking differently: Take 3 mLs by nebulization every 4 (four) hours as needed (wheezing or SOB). ) 360 mL 3  . levothyroxine (SYNTHROID, LEVOTHROID) 25 MCG tablet Take 25 mcg by mouth daily before breakfast.    . liothyronine (CYTOMEL) 5 MCG tablet Take 5 mcg by mouth daily.    . magnesium oxide (MAG-OX) 400 MG tablet Take 800 mg by mouth daily.    . mometasone (NASONEX) 50 MCG/ACT nasal spray Place 2 sprays into the nose daily.    . montelukast (SINGULAIR) 10 MG tablet TAKE 1 TABLET BY MOUTH ONCE DAILY 90 tablet 0  . Multiple Vitamins-Minerals (MULTIVITAMIN ADULT PO) Take 1 tablet by mouth daily.    . nebivolol (BYSTOLIC) 10 MG tablet Take 1 tablet (10 mg total) by mouth daily.    . Nebulizers (NEBULIZER COMPRESSOR) MISC Use as directed 1 each 0  . pantoprazole (PROTONIX) 40 MG tablet Take 1 tablet (40 mg total) by mouth 2 (two) times daily before a meal.    . predniSONE (DELTASONE) 10 MG tablet Take 10 mg by mouth daily. Take 4 tablets by mouth once a day with food for 2 days, then 3 for 2, then 2 for 2, then 1 for 2.  0  . Probiotic Product (PROBIOTIC DAILY) CAPS Take 1 capsule by mouth daily.    . Travoprost, BAK Free, (TRAVATAN) 0.004 % SOLN ophthalmic solution Place 1 drop into both eyes at  bedtime.    . vitamin A 10000 UNIT capsule Take 10,000 Units by mouth daily.    Marland Kitchen zolpidem (AMBIEN) 10 MG tablet Take 10 mg by mouth at bedtime.     No current facility-administered medications on file prior to visit.    Allergies  Allergen Reactions  . Levaquin [Levofloxacin] Other (See Comments)    MUSCLE PAIN  . Nickel Rash    Family History  Problem Relation Age of  Onset  . Lung cancer Father 33    dies age 54  . Emphysema Father   . COPD Brother 47    died age 5  . Stroke Mother   . Hypertension Sister   . Thyroid disease Sister     Graves Disease    BP 132/84 mmHg  Pulse 59  Temp(Src) 98.8 F (37.1 C) (Oral)  Ht 5\' 3"  (1.6 m)  Wt 153 lb (69.4 kg)  BMI 27.11 kg/m2  SpO2 97%  LMP 12/12/2005  Review of Systems denies depression, sob, constipation, numbness, blurry vision, muscle weakness, menopausal sxs, easy bruising, and syncope.  She reports myalgias, hair loss, weight gain, dry skin, and rhinorrhea.    Objective:   Physical Exam VS: see vs page GEN: no distress HEAD: head: no deformity eyes: no periorbital swelling, no proptosis external nose and ears are normal mouth: no lesion seen NECK: supple, thyroid is not enlarged CHEST WALL: no deformity LUNGS:  Clear to auscultation.   CV: reg rate and rhythm, no murmur ABD: abdomen is soft, nontender.  no hepatosplenomegaly.  not distended.  no hernia.  MUSCULOSKELETAL: muscle bulk and strength are grossly normal.  no obvious joint swelling.  gait is normal and steady EXTEMITIES: no deformity. no edema PULSES: no carotid bruit. NEURO:  cn 2-12 grossly intact.   readily moves all 4's.  sensation is intact to touch on all 4's.   SKIN:  Normal texture and temperature.  No rash or suspicious lesion is visible.   NODES:  None palpable at the neck.  PSYCH: alert, well-oriented.  anxious and tearful.   outside test results are reviewed: TPO antibodies are neg  Thyroid US: very small multinodular  goiter  Lab Results  Component Value Date   TSH 1.42 01/12/2015       Assessment & Plan:  Hypothyroidism, well-replaced.  Low random cortisol, new, due to exogenous glucocorticoids.  Anxiety, severe, not thyroid-related.     Patient is advised the following: Patient Instructions  blood tests are being requested for you today.  We'll let you know about the results. i would be happy to also recheck the cortisone level.  However, we would need to wait until you are off the prednisone x 2 weeks.  This is a blood test, and injection, and a repeat blood test 45 minutes later.          Hypothyroidism The thyroid is a large gland located in the lower front of your neck. The thyroid gland helps control metabolism. Metabolism is how your body handles food. It controls metabolism with the hormone thyroxine. When this gland is underactive (hypothyroid), it produces too little hormone.  CAUSES These include:   Absence or destruction of thyroid tissue.  Goiter due to iodine deficiency.  Goiter due to medications.  Congenital defects (since birth).  Problems with the pituitary. This causes a lack of TSH (thyroid stimulating hormone). This hormone tells the thyroid to turn out more hormone. SYMPTOMS  Lethargy (feeling as though you have no energy)  Cold intolerance  Weight gain (in spite of normal food intake)  Dry skin  Coarse hair  Menstrual irregularity (if severe, may lead to infertility)  Slowing of thought processes Cardiac problems are also caused by insufficient amounts of thyroid hormone. Hypothyroidism in the newborn is cretinism, and is an extreme form. It is important that this form be treated adequately and immediately or it will lead rapidly to retarded physical and mental development. DIAGNOSIS  To prove hypothyroidism,  your caregiver may do blood tests and ultrasound tests. Sometimes the signs are hidden. It may be necessary for your caregiver to watch this  illness with blood tests either before or after diagnosis and treatment. TREATMENT  Low levels of thyroid hormone are increased by using synthetic thyroid hormone. This is a safe, effective treatment. It usually takes about four weeks to gain the full effects of the medication. After you have the full effect of the medication, it will generally take another four weeks for problems to leave. Your caregiver may start you on low doses. If you have had heart problems the dose may be gradually increased. It is generally not an emergency to get rapidly to normal. HOME CARE INSTRUCTIONS   Take your medications as your caregiver suggests. Let your caregiver know of any medications you are taking or start taking. Your caregiver will help you with dosage schedules.  As your condition improves, your dosage needs may increase. It will be necessary to have continuing blood tests as suggested by your caregiver.  Report all suspected medication side effects to your caregiver. SEEK MEDICAL CARE IF: Seek medical care if you develop:  Sweating.  Tremulousness (tremors).  Anxiety.  Rapid weight loss.  Heat intolerance.  Emotional swings.  Diarrhea.  Weakness. SEEK IMMEDIATE MEDICAL CARE IF:  You develop chest pain, an irregular heart beat (palpitations), or a rapid heart beat. MAKE SURE YOU:   Understand these instructions.  Will watch your condition.  Will get help right away if you are not doing well or get worse. Document Released: 11/28/2005 Document Revised: 02/20/2012 Document Reviewed: 07/18/2008 [facility] Patient Information 2015 [place], [place]. This information is not intended to replace advice given to you by your health care provider. Make sure you discuss any questions you have with your health care provider.

## 2015-01-12 NOTE — Patient Instructions (Addendum)
blood tests are being requested for you today.  We'll let you know about the results. i would be happy to also recheck the cortisone level.  However, we would need to wait until you are off the prednisone x 2 weeks.  This is a blood test, and injection, and a repeat blood test 45 minutes later.          Hypothyroidism The thyroid is a large gland located in the lower front of your neck. The thyroid gland helps control metabolism. Metabolism is how your body handles food. It controls metabolism with the hormone thyroxine. When this gland is underactive (hypothyroid), it produces too little hormone.  CAUSES These include:   Absence or destruction of thyroid tissue.  Goiter due to iodine deficiency.  Goiter due to medications.  Congenital defects (since birth).  Problems with the pituitary. This causes a lack of TSH (thyroid stimulating hormone). This hormone tells the thyroid to turn out more hormone. SYMPTOMS  Lethargy (feeling as though you have no energy)  Cold intolerance  Weight gain (in spite of normal food intake)  Dry skin  Coarse hair  Menstrual irregularity (if severe, may lead to infertility)  Slowing of thought processes Cardiac problems are also caused by insufficient amounts of thyroid hormone. Hypothyroidism in the newborn is cretinism, and is an extreme form. It is important that this form be treated adequately and immediately or it will lead rapidly to retarded physical and mental development. DIAGNOSIS  To prove hypothyroidism, your caregiver may do blood tests and ultrasound tests. Sometimes the signs are hidden. It may be necessary for your caregiver to watch this illness with blood tests either before or after diagnosis and treatment. TREATMENT  Low levels of thyroid hormone are increased by using synthetic thyroid hormone. This is a safe, effective treatment. It usually takes about four weeks to gain the full effects of the medication. After you have the  full effect of the medication, it will generally take another four weeks for problems to leave. Your caregiver may start you on low doses. If you have had heart problems the dose may be gradually increased. It is generally not an emergency to get rapidly to normal. HOME CARE INSTRUCTIONS   Take your medications as your caregiver suggests. Let your caregiver know of any medications you are taking or start taking. Your caregiver will help you with dosage schedules.  As your condition improves, your dosage needs may increase. It will be necessary to have continuing blood tests as suggested by your caregiver.  Report all suspected medication side effects to your caregiver. SEEK MEDICAL CARE IF: Seek medical care if you develop:  Sweating.  Tremulousness (tremors).  Anxiety.  Rapid weight loss.  Heat intolerance.  Emotional swings.  Diarrhea.  Weakness. SEEK IMMEDIATE MEDICAL CARE IF:  You develop chest pain, an irregular heart beat (palpitations), or a rapid heart beat. MAKE SURE YOU:   Understand these instructions.  Will watch your condition.  Will get help right away if you are not doing well or get worse. Document Released: 11/28/2005 Document Revised: 02/20/2012 Document Reviewed: 07/18/2008 [facility] Patient Information 2015 [place], [place]. This information is not intended to replace advice given to you by your health care provider. Make sure you discuss any questions you have with your health care provider.

## 2015-01-14 ENCOUNTER — Other Ambulatory Visit: Payer: 59

## 2015-01-14 ENCOUNTER — Ambulatory Visit (INDEPENDENT_AMBULATORY_CARE_PROVIDER_SITE_OTHER): Payer: 59 | Admitting: Internal Medicine

## 2015-01-14 ENCOUNTER — Ambulatory Visit: Payer: 59

## 2015-01-14 ENCOUNTER — Encounter: Payer: Self-pay | Admitting: Internal Medicine

## 2015-01-14 VITALS — BP 126/60 | HR 71 | Ht 61.0 in | Wt 152.0 lb

## 2015-01-14 DIAGNOSIS — J3089 Other allergic rhinitis: Principal | ICD-10-CM

## 2015-01-14 DIAGNOSIS — J4530 Mild persistent asthma, uncomplicated: Secondary | ICD-10-CM

## 2015-01-14 DIAGNOSIS — R5383 Other fatigue: Secondary | ICD-10-CM

## 2015-01-14 DIAGNOSIS — J309 Allergic rhinitis, unspecified: Secondary | ICD-10-CM

## 2015-01-14 DIAGNOSIS — R05 Cough: Secondary | ICD-10-CM

## 2015-01-14 DIAGNOSIS — J302 Other seasonal allergic rhinitis: Secondary | ICD-10-CM

## 2015-01-14 DIAGNOSIS — R053 Chronic cough: Secondary | ICD-10-CM

## 2015-01-14 LAB — RHEUMATOID FACTOR: Rhuematoid fact SerPl-aCnc: <10 IU/mL (ref <=14)

## 2015-01-14 NOTE — Patient Instructions (Signed)
We will remix and restart your allergy vaccine here.  I will plan on seeing you in 2 months to see how the allergy vaccine buildup is going.. You can decide whether to keep or cancel the appointment for Feb, 15.

## 2015-01-14 NOTE — Assessment & Plan Note (Signed)
Cough and dyspnea complaints seem to be improving and the systemic malaise described at last visit is less apparent. There is a component of depression.

## 2015-01-14 NOTE — Progress Notes (Signed)
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 She is followed here by Dr Chase Caller for Cough/ obstructive lung disease/ chronic bronchitis. Patient states doing much better since last visit. stillc c/o drainage and cough. Patient states [name] sample helped with drainage. Also, c/o itching at the 0.1 dosage of the allergy vaccine and states the .05 dosage did ok.  Has not needed prednisone or Biaxin given at our last visit. No acute exacerbations. Vaccine volume was held at 0.05 mL from her Silver vial through August. [name]. Allergy Profile 06/22/2012 total IgE 26.2/negative for specific elevations CBC 06/22/2012-WBC 6000 with normal differential Immunoglobulins 06/22/2012- IgG normal 929, IgA normal 153, IgM low 42 (52-322). Discussed with her.  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 She is followed here by Dr Chase Caller for Cough/ obstructive lung disease/ chronic bronchitis. [name]. [name] 1:50 . Does have PND and cough with light mucus.  Did very well until the month of July and then began having increased nasal congestion. This would not have been the spring pollen season. She got a live chicken in April. Wakes coughing. Using Nasonex and ipratropium nasal sprays with daily Allegra.  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 She is followed here by Dr Chase Caller for Cough/ obstructive lung disease/ chronic bronchitis, complicated by GERD. C/o headache, PND, body ache, fatigue, cough with out mucus production, mild SOB with moderate activity, and mild chest tightness d/t chest congestion per pt. Symptoms occuring x 1 week. She has been outdoors a lot a week ago and blames that exposure for  sinus headache, burning eyes-now mostly chest tightness and cough. She continues allergy vaccine 1:50 0.3 ML's GH, admitting she misses occasional dose. Actively remodeling home with associated dust exposure.  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 Remains on Allegra and singulair .  Presents for an acute office visit.  Complains of chills, body aches and scratchy sore throat for past 2 weeks..She saw eye doctor who told her to stop Nasonex due to increased optic pressure. She was told to try Astelin . Has been off Nasonex for 3 weeks.  Flu shot utd  Last cxr was in 03/2014 with PCP in [place], reported nml.  On Ace inhibitor .  rec Zpack take as directed.  Mucinex DM Twice daily  As needed  Cough/congestion .  Saline nasal rinses and gel As needed   Astelin 2 puffs At bedtime   Remain of Nasonex .  Fluids and rest  Would discuss with family doctor regarding Lisinopril -side effects of cough, may benefit from change   12/15/2014 acute ov/Wert re:  Chronic Cough/ fatigue/ still on ACEi  Chief Complaint  Patient presents with  . Acute Visit    Pt of Dr Annamaria Boots. Pt c/o extreme fatigue- esp in the am since had infulenza vaccine 10/8/5. She also c/o chest tightness.   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 No unusual exp hx or h/o childhood pna/ asthma or knowledge of premature birth. Sleeping ok without nocturnal  or early am exacerbation  of respiratory  c/o's or need for noct saba. 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 She has been followed here by Dr Chase Caller for Cough/ obstructive lung disease/ chronic bronchitis, complicated by GERD. Pt is currently on FMLA due to possible lyme disease, not sure yet what diagnosis is.  Pt says she has been sick every since she had her flu shot on 09/19/2014.     Allergy vaccine 1:10 GH  scheduled for update allergy skin testing next week. She has been seen here a couple of times for ongoing problem-3 days after her flu shot in October she woke during the night with severe myalgias, fever. Since then has felt hot and cold, fatigue. Has worked closely with her primary physician and with a dermatologist/alternative medicine provider. Initial concern of Lyme disease. Out of work since November 16 because of this illness. Now feels she is gradually getting a little better. Coughing much less. Myalgias have largely resolved. Began improving after she was treated with doxycycline and Flagyl for Lyme disease-connection unclear. Still feels tired and intermittently chilled to the bone and flushed. Saw no benefit from Depo-Medrol prednisone taper. Changed from lisinopril to a beta blocker. Thyroid replacement pending assessment by Dr. Ellison/endocrinology next week.  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 She has been followed here by Dr Chase Caller for Cough/ obstructive lung disease/ chronic bronchitis, complicated by GERD. Has been getting allergy vaccine here based on original testing by Dr Jinny Blossom. She describes difficult home situation with disabled husband. She is returning to work at [employer]. Her PCP requests we draw ANA and RF while here. Normal CBC and immunoglobulins 2013.    Allergy skin test- 01/14/15-positive for mixed grass pollens, fall weeds, several tree pollens, dust mite, feather, cat and some molds. We compared this to the contents of her current allergy vaccine started in 2011. After discussion, she wants to remix and restart here based on current testing.  ROS-see HPI Constitutional:   No-   weight loss, night sweats, +fevers, chills,+ fatigue, lassitude. HEENT:   No-  headaches, difficulty swallowing, tooth/dental problems, sore throat,       No-  sneezing, itching, ear ache, nasal congestion, post nasal drip,  CV:  No-   chest pain, orthopnea, PND, swelling in lower extremities, anasarca,                                  dizziness, palpitations Resp: No-   shortness of breath with exertion or at rest.              No-   productive cough,  No non-productive cough,  No- coughing up of blood.              No-   change  in color of mucus.  No- wheezing.   Skin: No-   rash or lesions. GI:  No-   heartburn, indigestion, abdominal pain, nausea, vomiting,  GU:  MS:  No-   joint pain or swelling.   Neuro-     nothing unusual Psych:  No- change in mood or affect. + depression or anxiety.  No memory loss.    Objective:  OBJ- Physical Exam  +looks tired/depressed General- Alert, Oriented, Affect-appropriate, Distress- none acute Skin- rash-none, lesions- none, excoriation- none Lymphadenopathy- none Head- atraumatic            Eyes- Gross vision intact, PERRLA, conjunctivae and secretions clear            Ears- Hearing, canals-normal            Nose- Clear, no-Septal dev, mucus, polyps, erosion, perforation             Throat- Mallampati II , mucosa clear , drainage- none, tonsils- atrophic Neck- flexible , trachea midline, no stridor , thyroid nl, carotid no bruit Chest - symmetrical excursion , unlabored           Heart/CV- RRR , no murmur , no gallop  , no rub, nl s1 s2                           - JVD- none , edema- none, stasis changes- none, varices-  none           Lung- clear to P&A, wheeze- none, cough- none , dullness-none, rub- none           Chest wall-  Abd-  Br/ Gen/ Rectal- Not done, not indicated Extrem- cyanosis- none, clubbing, none, atrophy- none, strength- nl Neuro- grossly intact to observation     Assessment & Plan:

## 2015-01-14 NOTE — Assessment & Plan Note (Signed)
We discussed options of stopping allergy vaccine for observation, continuing current mix or restarting new, based on latest skin testing. She chooses to restart. Risk benefit considerations and realistic goals Plan-restart and rebuild new vaccine

## 2015-01-15 ENCOUNTER — Telehealth: Payer: Self-pay | Admitting: Internal Medicine

## 2015-01-15 ENCOUNTER — Encounter: Payer: Self-pay | Admitting: Internal Medicine

## 2015-01-15 LAB — ANA: Anti Nuclear Antibody(ANA): NEGATIVE

## 2015-01-15 NOTE — Telephone Encounter (Signed)
LMTCB

## 2015-01-16 NOTE — Telephone Encounter (Signed)
Spoke with pt and gave her the results of ANA and RF. Nothing further was needed.

## 2015-01-19 ENCOUNTER — Ambulatory Visit (INDEPENDENT_AMBULATORY_CARE_PROVIDER_SITE_OTHER): Payer: 59

## 2015-01-19 DIAGNOSIS — J309 Allergic rhinitis, unspecified: Secondary | ICD-10-CM

## 2015-01-21 ENCOUNTER — Telehealth: Payer: Self-pay | Admitting: Internal Medicine

## 2015-01-21 NOTE — Telephone Encounter (Signed)
Called pt. 01/19/15 to let her know her vac. Is ready for her to start. She will call for an appt. When she knows she's coming to [place]

## 2015-01-26 ENCOUNTER — Ambulatory Visit: Payer: 59 | Admitting: Internal Medicine

## 2015-01-29 ENCOUNTER — Other Ambulatory Visit (INDEPENDENT_AMBULATORY_CARE_PROVIDER_SITE_OTHER): Payer: 59

## 2015-01-29 ENCOUNTER — Other Ambulatory Visit: Payer: Self-pay | Admitting: Endocrinology

## 2015-01-29 ENCOUNTER — Ambulatory Visit (INDEPENDENT_AMBULATORY_CARE_PROVIDER_SITE_OTHER): Payer: 59

## 2015-01-29 DIAGNOSIS — M791 Myalgia, unspecified site: Secondary | ICD-10-CM

## 2015-01-29 DIAGNOSIS — E279 Disorder of adrenal gland, unspecified: Secondary | ICD-10-CM | POA: Insufficient documentation

## 2015-01-29 DIAGNOSIS — J309 Allergic rhinitis, unspecified: Secondary | ICD-10-CM

## 2015-01-29 LAB — CORTISOL
Cortisol, Plasma: 13.7 ug/dL
Cortisol, Plasma: 19.4 ug/dL

## 2015-01-29 MED ORDER — COSYNTROPIN 0.25 MG IJ SOLR
0.2500 mg | Freq: Once | INTRAMUSCULAR | Status: AC
  Administered 2015-01-29: 0.25 mg via INTRAMUSCULAR

## 2015-02-03 ENCOUNTER — Ambulatory Visit (INDEPENDENT_AMBULATORY_CARE_PROVIDER_SITE_OTHER): Payer: 59

## 2015-02-03 DIAGNOSIS — J309 Allergic rhinitis, unspecified: Secondary | ICD-10-CM

## 2015-02-05 ENCOUNTER — Other Ambulatory Visit (HOSPITAL_COMMUNITY)
Admission: AD | Admit: 2015-02-05 | Discharge: 2015-02-05 | Disposition: A | Discharge status: Home / Self Care | Payer: 59 | Source: Ambulatory Visit | Attending: Family Medicine | Admitting: Family Medicine

## 2015-02-05 DIAGNOSIS — I10 Essential (primary) hypertension: Secondary | ICD-10-CM | POA: Insufficient documentation

## 2015-02-05 DIAGNOSIS — B351 Tinea unguium: Secondary | ICD-10-CM | POA: Insufficient documentation

## 2015-02-05 LAB — BASIC METABOLIC PANEL
Anion gap: 4 — ABNORMAL LOW (ref 5–15)
BUN: 17 mg/dL (ref 6–23)
CO2: 29 mmol/L (ref 19–32)
Calcium: 9.1 mg/dL (ref 8.4–10.5)
Chloride: 106 mmol/L (ref 96–112)
Creatinine, Ser: 0.7 mg/dL (ref 0.50–1.10)
GFR calc Af Amer: >90 mL/min (ref >90)
GFR calc non Af Amer: >90 mL/min (ref >90)
Glucose, Bld: 97 mg/dL (ref 70–99)
Potassium: 4.5 mmol/L (ref 3.5–5.1)
Sodium: 139 mmol/L (ref 135–145)

## 2015-02-05 LAB — HEPATIC FUNCTION PANEL
ALT: 19 U/L (ref 0–35)
AST: 20 U/L (ref 0–37)
Albumin: 4.4 g/dL (ref 3.5–5.2)
Alkaline Phosphatase: 53 U/L (ref 39–117)
Bilirubin, Direct: <0.1 mg/dL (ref 0.0–0.5)
Total Bilirubin: 0.5 mg/dL (ref 0.3–1.2)
Total Protein: 6.7 g/dL (ref 6.0–8.3)

## 2015-02-05 LAB — CBC
HCT: 38.8 % (ref 36.0–46.0)
Hemoglobin: 13 g/dL (ref 12.0–15.0)
MCH: 32.6 pg (ref 26.0–34.0)
MCHC: 33.5 g/dL (ref 30.0–36.0)
MCV: 97.2 fL (ref 78.0–100.0)
Platelets: 331 10*3/uL (ref 150–400)
RBC: 3.99 MIL/uL (ref 3.87–5.11)
RDW: 13.4 % (ref 11.5–15.5)
WBC: 5.4 10*3/uL (ref 4.0–10.5)

## 2015-02-05 LAB — LIPID PANEL
Cholesterol: 247 mg/dL — ABNORMAL HIGH (ref 0–200)
HDL: 79 mg/dL (ref >39)
LDL Cholesterol: 152 mg/dL — ABNORMAL HIGH (ref 0–99)
Total CHOL/HDL Ratio: 3.1 RATIO
Triglycerides: 82 mg/dL (ref <150)
VLDL: 16 mg/dL (ref 0–40)

## 2015-02-06 ENCOUNTER — Ambulatory Visit (INDEPENDENT_AMBULATORY_CARE_PROVIDER_SITE_OTHER): Payer: 59

## 2015-02-06 DIAGNOSIS — J309 Allergic rhinitis, unspecified: Secondary | ICD-10-CM

## 2015-02-10 ENCOUNTER — Ambulatory Visit (INDEPENDENT_AMBULATORY_CARE_PROVIDER_SITE_OTHER): Payer: 59

## 2015-02-10 DIAGNOSIS — J309 Allergic rhinitis, unspecified: Secondary | ICD-10-CM

## 2015-02-11 ENCOUNTER — Ambulatory Visit: Payer: 59

## 2015-02-12 ENCOUNTER — Ambulatory Visit: Payer: 59

## 2015-02-12 ENCOUNTER — Ambulatory Visit (INDEPENDENT_AMBULATORY_CARE_PROVIDER_SITE_OTHER): Payer: 59

## 2015-02-12 DIAGNOSIS — J309 Allergic rhinitis, unspecified: Secondary | ICD-10-CM

## 2015-02-17 ENCOUNTER — Ambulatory Visit (INDEPENDENT_AMBULATORY_CARE_PROVIDER_SITE_OTHER): Payer: 59

## 2015-02-17 DIAGNOSIS — J309 Allergic rhinitis, unspecified: Secondary | ICD-10-CM

## 2015-02-20 ENCOUNTER — Ambulatory Visit: Payer: 59

## 2015-02-23 ENCOUNTER — Encounter: Payer: Self-pay | Admitting: Internal Medicine

## 2015-02-23 ENCOUNTER — Telehealth: Payer: Self-pay | Admitting: Internal Medicine

## 2015-02-23 NOTE — Telephone Encounter (Signed)
Dr Kenton Kingfisher asks about being able to use beta blocker while she is on allergy shots. Discussed. Verified with allergy lab that she has not had reactions to shots. Continue allergy shots here with caution.

## 2015-02-26 ENCOUNTER — Ambulatory Visit (INDEPENDENT_AMBULATORY_CARE_PROVIDER_SITE_OTHER): Payer: 59

## 2015-02-26 DIAGNOSIS — J309 Allergic rhinitis, unspecified: Secondary | ICD-10-CM

## 2015-03-04 ENCOUNTER — Ambulatory Visit (INDEPENDENT_AMBULATORY_CARE_PROVIDER_SITE_OTHER): Payer: 59

## 2015-03-04 ENCOUNTER — Other Ambulatory Visit: Payer: Self-pay | Admitting: Internal Medicine

## 2015-03-04 DIAGNOSIS — J309 Allergic rhinitis, unspecified: Secondary | ICD-10-CM | POA: Diagnosis not present

## 2015-03-12 ENCOUNTER — Ambulatory Visit (INDEPENDENT_AMBULATORY_CARE_PROVIDER_SITE_OTHER): Payer: 59

## 2015-03-12 DIAGNOSIS — J309 Allergic rhinitis, unspecified: Secondary | ICD-10-CM

## 2015-03-17 ENCOUNTER — Ambulatory Visit (INDEPENDENT_AMBULATORY_CARE_PROVIDER_SITE_OTHER): Payer: 59

## 2015-03-17 DIAGNOSIS — J309 Allergic rhinitis, unspecified: Secondary | ICD-10-CM

## 2015-03-23 ENCOUNTER — Ambulatory Visit (INDEPENDENT_AMBULATORY_CARE_PROVIDER_SITE_OTHER): Payer: 59

## 2015-03-23 DIAGNOSIS — J309 Allergic rhinitis, unspecified: Secondary | ICD-10-CM

## 2015-03-26 ENCOUNTER — Ambulatory Visit (INDEPENDENT_AMBULATORY_CARE_PROVIDER_SITE_OTHER): Payer: 59

## 2015-03-26 DIAGNOSIS — J309 Allergic rhinitis, unspecified: Secondary | ICD-10-CM | POA: Diagnosis not present

## 2015-03-30 ENCOUNTER — Ambulatory Visit (INDEPENDENT_AMBULATORY_CARE_PROVIDER_SITE_OTHER): Payer: 59

## 2015-03-30 DIAGNOSIS — J309 Allergic rhinitis, unspecified: Secondary | ICD-10-CM

## 2015-03-31 ENCOUNTER — Ambulatory Visit (INDEPENDENT_AMBULATORY_CARE_PROVIDER_SITE_OTHER): Payer: 59

## 2015-03-31 DIAGNOSIS — J309 Allergic rhinitis, unspecified: Secondary | ICD-10-CM

## 2015-04-02 ENCOUNTER — Telehealth: Payer: Self-pay | Admitting: Internal Medicine

## 2015-04-02 ENCOUNTER — Ambulatory Visit: Payer: 59

## 2015-04-02 NOTE — Telephone Encounter (Signed)
Pt. Has been itching progressive worse for the last three or four wks. I talked to you about this you advised me to tell  her to stop the shots for a month. If the itching stops it was the shots if not it's something else. I gave her your recs. And she's concerned her allegy related symptoms she was having before she started the shots will come back. She also remembered the minute I left the room to talk to you her daughter gave her a dog (yorkie, sti-zoo mix, they don't shed.) I also Advised her in light of this new info. She may want to see if Her daughter or someone can take the dog for 2 wks. And see if that could be it.   Just wanted you to know.

## 2015-04-02 NOTE — Telephone Encounter (Signed)
I would not expect allergy vaccine to be causing this itching, but best way to be sure is to skip shots for one month.

## 2015-04-06 ENCOUNTER — Ambulatory Visit: Payer: 59

## 2015-04-07 NOTE — Telephone Encounter (Signed)
Gave pt. recs she is reluctantly going to stop shots for a month. Nothing further needed. Closing note.

## 2015-05-01 ENCOUNTER — Ambulatory Visit: Payer: 59 | Admitting: Internal Medicine

## 2015-05-13 ENCOUNTER — Other Ambulatory Visit: Payer: Self-pay | Admitting: Internal Medicine

## 2015-05-13 MED ORDER — MOMETASONE FUROATE 50 MCG/ACT NA SUSP: 2.0000 | Freq: Every day | NASAL | Status: DC

## 2015-07-20 ENCOUNTER — Telehealth: Payer: Self-pay | Admitting: Internal Medicine

## 2015-07-20 MED ORDER — DOXYCYCLINE HYCLATE 100 MG PO TABS: ORAL_TABLET | ORAL | Status: DC

## 2015-07-20 NOTE — Telephone Encounter (Signed)
Pt calling requesting antibiotic Pt states that she feels this is related to her allergies - sore throat, swollen lymph nodes, cough with pale yellow mucus, body aches and chills x 5 days - worsening.  Denies fever. Pt states that that she is normally given Biaxin abx. 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 Pt stopped allergy vaccines in April d/t reaction.  Pt also mentioned that she was at the beach last week and her (3)Granddaughters and Daughter-in-law have hand foot and mouth with high fevers.  Pt states that she does not think she has any symptoms related to Hand, Foot and Mouth - no sores or blisters.   Please advise Dr Annamaria Boots. Thanks.  Allergies  Allergen Reactions  . Levaquin [Levofloxacin] Other (See Comments)    MUSCLE PAIN  . Nickel Rash     Medication List       This list is accurate as of: 07/20/15  1:08 PM.  Always use your most recent med list.               ALPRAZolam 0.25 MG tablet  Commonly known as:  XANAX  Take 0.125-0.25 mg by mouth daily. As needed anxiety     azelastine 0.1 % nasal spray  Commonly known as:  ASTELIN  Place into both nostrils 2 (two) times daily. Use in each nostril as directed     B-Complex/B-12 Liqd  Place 5 mLs under the tongue daily at 2 PM daily at 2 PM.     Iodine (Kelp) Tabs  Take 1 tablet by mouth daily.     ipratropium 0.02 % nebulizer solution  Commonly known as:  ATROVENT  Take 2.5 mLs (0.5 mg total) by nebulization every 6 (six) hours as needed for wheezing or shortness of breath.     ipratropium 0.03 % nasal spray  Commonly known as:  ATROVENT  Place 2 sprays into the nose every 12 (twelve) hours as needed for rhinitis.     ipratropium-albuterol 0.5-2.5 (3) MG/3ML Soln  Commonly known as:  DUONEB  Take 3 mLs by nebulization every 4 (four) hours as needed.     levothyroxine 25 MCG tablet  Commonly known as:  SYNTHROID, LEVOTHROID  Take  25 mcg by mouth daily before breakfast.     liothyronine 5 MCG tablet  Commonly known as:  CYTOMEL  Take 5 mcg by mouth daily.     magnesium oxide 400 MG tablet  Commonly known as:  MAG-OX  Take 800 mg by mouth daily.     mometasone 50 MCG/ACT nasal spray  Commonly known as:  NASONEX  Place 2 sprays into the nose daily.     montelukast 10 MG tablet  Commonly known as:  SINGULAIR  TAKE 1 TABLET BY MOUTH ONCE DAILY     MULTIVITAMIN ADULT PO  Take 1 tablet by mouth daily.     nebivolol 10 MG tablet  Commonly known as:  BYSTOLIC  Take 1 tablet (10 mg total) by mouth daily.     Nebulizer Compressor Misc  Use as directed     pantoprazole 40 MG tablet  Commonly known as:  PROTONIX  Take 1 tablet (40 mg total) by mouth 2 (two) times daily before a meal.     predniSONE 10 MG tablet  Commonly known as:  DELTASONE  Take 10 mg by mouth daily. Take 4 tablets by mouth once a day with food for 2  days, then 3 for 2, then 2 for 2, then 1 for 2.     PROBIOTIC DAILY Caps  Take 1 capsule by mouth daily.     Travoprost (BAK Free) 0.004 % Soln ophthalmic solution  Commonly known as:  TRAVATAN  Place 1 drop into both eyes at bedtime.     VENTOLIN HFA 108 (90 BASE) MCG/ACT inhaler  Generic drug:  albuterol  Inhale 2 puffs into the lungs every 6 (six) hours as needed for wheezing or shortness of breath.     albuterol (2.5 MG/3ML) 0.083% nebulizer solution  Commonly known as:  PROVENTIL  Take 3 mLs (2.5 mg total) by nebulization every 6 (six) hours as needed for wheezing or shortness of breath.     albuterol 108 (90 BASE) MCG/ACT inhaler  Commonly known as:  PROAIR HFA  2 puffs every 4 hours as needed only  if your can't catch your breath     vitamin A 10000 UNIT capsule  Take 10,000 Units by mouth daily.     Vitamin D 2000 UNITS tablet  Take 5,000 Units by mouth daily.     zolpidem 10 MG tablet  Commonly known as:  AMBIEN  Take 10 mg by mouth at bedtime.

## 2015-07-20 NOTE — Telephone Encounter (Signed)
lmomtcb for pt 

## 2015-07-20 NOTE — Telephone Encounter (Signed)
Offer either Zpak or doxycycline 10 mg, # 8, 2 today then one daily- whichever she feels works best for her.

## 2015-07-20 NOTE — Telephone Encounter (Signed)
Pt returned call  [phone]

## 2015-07-20 NOTE — Telephone Encounter (Signed)
Called and spoke to pt. Informed her of the recs per CY. Pt requesting doxy. Rx sent to preferred pharmacy. Pt verbalized understanding and denied any further questions or concerns at this time.

## 2015-08-05 ENCOUNTER — Other Ambulatory Visit: Payer: Self-pay | Admitting: Internal Medicine

## 2015-08-05 ENCOUNTER — Encounter: Payer: Self-pay | Admitting: Internal Medicine

## 2015-08-14 ENCOUNTER — Telehealth: Payer: Self-pay | Admitting: Internal Medicine

## 2015-08-14 NOTE — Telephone Encounter (Signed)
Thanks. We will consider her stopped.

## 2015-08-14 NOTE — Telephone Encounter (Signed)
A sick message was sent 07/20/15 and they mentioned she had stopped her vac.. You had told her 04/02/15 To stop her all.vac. For a month. She was itching really bad all over. She took your advice and hasn't had a shot since 03/30/15. Just wanted to make sure you knew about this.

## 2015-08-18 NOTE — Telephone Encounter (Signed)
Noted  

## 2015-12-14 DIAGNOSIS — F39 Unspecified mood [affective] disorder: Secondary | ICD-10-CM | POA: Diagnosis not present

## 2015-12-14 MED FILL — PALIPERIDONE ER 3 MG TABLET: 3 | 30 days supply | Qty: 30 | Fill #0

## 2015-12-15 DIAGNOSIS — F418 Other specified anxiety disorders: Secondary | ICD-10-CM | POA: Diagnosis not present

## 2015-12-24 DIAGNOSIS — F418 Other specified anxiety disorders: Secondary | ICD-10-CM | POA: Diagnosis not present

## 2015-12-24 DIAGNOSIS — H40009 Preglaucoma, unspecified, unspecified eye: Secondary | ICD-10-CM | POA: Diagnosis not present

## 2015-12-28 MED FILL — LEVOTHYROXINE 25 MCG TABLET: 25 | 90 days supply | Qty: 90 | Fill #2

## 2015-12-28 MED FILL — PARoxetine HCL 20 MG TABS: 20 | 30 days supply | Qty: 30 | Fill #0

## 2015-12-28 MED FILL — LIOTHYRONINE SOD 5 MCG TAB: 5 | 90 days supply | Qty: 90 | Fill #2

## 2015-12-29 MED FILL — ALPRAZolam 0.25 MG TABS: 0.25 | 30 days supply | Qty: 120 | Fill #2

## 2015-12-31 MED FILL — busPIRone HCL 30 MG TABS: 30 | 90 days supply | Qty: 180 | Fill #0

## 2016-01-02 DIAGNOSIS — F39 Unspecified mood [affective] disorder: Secondary | ICD-10-CM | POA: Diagnosis not present

## 2016-01-04 DIAGNOSIS — F418 Other specified anxiety disorders: Secondary | ICD-10-CM | POA: Diagnosis not present

## 2016-01-07 MED FILL — PANTOPRAZOLE SOD DR 40 MG T: 40 | 90 days supply | Qty: 90 | Fill #1

## 2016-01-08 DIAGNOSIS — F4310 Post-traumatic stress disorder, unspecified: Secondary | ICD-10-CM | POA: Diagnosis not present

## 2016-01-11 MED FILL — PALIPERIDONE ER 3 MG TABLET: 3 | 30 days supply | Qty: 30 | Fill #0

## 2016-01-11 MED FILL — PARoxetine HCL 20 MG TABS: 20 | 30 days supply | Qty: 45 | Fill #0

## 2016-01-20 MED FILL — MOMETASONE FUROATE 50 MCG S: 50 | 30 days supply | Qty: 17 | Fill #2

## 2016-01-27 DIAGNOSIS — F4321 Adjustment disorder with depressed mood: Secondary | ICD-10-CM | POA: Diagnosis not present

## 2016-01-27 DIAGNOSIS — F411 Generalized anxiety disorder: Secondary | ICD-10-CM | POA: Diagnosis not present

## 2016-01-27 DIAGNOSIS — E781 Pure hyperglyceridemia: Secondary | ICD-10-CM | POA: Diagnosis not present

## 2016-01-27 DIAGNOSIS — R7302 Impaired glucose tolerance (oral): Secondary | ICD-10-CM | POA: Diagnosis not present

## 2016-01-27 DIAGNOSIS — I10 Essential (primary) hypertension: Secondary | ICD-10-CM | POA: Diagnosis not present

## 2016-01-27 DIAGNOSIS — E039 Hypothyroidism, unspecified: Secondary | ICD-10-CM | POA: Diagnosis not present

## 2016-01-29 DIAGNOSIS — J020 Streptococcal pharyngitis: Secondary | ICD-10-CM | POA: Diagnosis not present

## 2016-01-29 DIAGNOSIS — M791 Myalgia: Secondary | ICD-10-CM | POA: Diagnosis not present

## 2016-01-29 DIAGNOSIS — R05 Cough: Secondary | ICD-10-CM | POA: Diagnosis not present

## 2016-02-08 MED FILL — MONTELUKAST SOD 10 MG TAB: 10 | 90 days supply | Qty: 90 | Fill #1

## 2016-02-09 DIAGNOSIS — F4310 Post-traumatic stress disorder, unspecified: Secondary | ICD-10-CM | POA: Diagnosis not present

## 2016-02-09 MED FILL — TOPIRAMATE 25 MG TABLET: 25 | 30 days supply | Qty: 120 | Fill #0

## 2016-02-09 MED FILL — PARoxetine HCL 20 MG TABS: 20 | 30 days supply | Qty: 60 | Fill #0

## 2016-02-09 MED FILL — ALPRAZolam 0.25 MG TABS: 0.25 | 30 days supply | Qty: 120 | Fill #0

## 2016-02-10 DIAGNOSIS — F39 Unspecified mood [affective] disorder: Secondary | ICD-10-CM | POA: Diagnosis not present

## 2016-02-10 MED FILL — PALIPERIDONE ER 3 MG TABLET: 3 | 30 days supply | Qty: 30 | Fill #1

## 2016-02-10 MED FILL — ZOLPIDEM TARTRATE 10 MG TAB: 10 | 90 days supply | Qty: 90 | Fill #0

## 2016-03-01 DIAGNOSIS — F4310 Post-traumatic stress disorder, unspecified: Secondary | ICD-10-CM | POA: Diagnosis not present

## 2016-03-01 MED FILL — ARIPiprazole 10 MG TABS: 10 | 30 days supply | Qty: 30 | Fill #0

## 2016-03-08 MED FILL — JUBLIA 10% TOPICAL SOLUTION: 10 | 10 days supply | Qty: 8 | Fill #0

## 2016-03-10 DIAGNOSIS — F4310 Post-traumatic stress disorder, unspecified: Secondary | ICD-10-CM | POA: Diagnosis not present

## 2016-03-15 DIAGNOSIS — M545 Low back pain: Secondary | ICD-10-CM | POA: Diagnosis not present

## 2016-03-15 DIAGNOSIS — M25551 Pain in right hip: Secondary | ICD-10-CM | POA: Diagnosis not present

## 2016-03-15 MED FILL — traMADol HCL 50 MG TABS: 50 | 7 days supply | Qty: 60 | Fill #0

## 2016-03-15 MED FILL — predniSONE 10 MG (48) TBPK: 10 | 12 days supply | Qty: 48 | Fill #0

## 2016-03-18 DIAGNOSIS — F4310 Post-traumatic stress disorder, unspecified: Secondary | ICD-10-CM | POA: Diagnosis not present

## 2016-03-18 MED FILL — PARoxetine HCL 20 MG TABS: 20 | 30 days supply | Qty: 60 | Fill #1

## 2016-03-18 MED FILL — ARIPiprazole 15 MG TABS: 15 | 30 days supply | Qty: 30 | Fill #0

## 2016-03-18 MED FILL — TOPIRAMATE 100 MG TABLET: 100 | 30 days supply | Qty: 30 | Fill #0

## 2016-03-18 MED FILL — ALPRAZolam 0.25 MG TABS: 0.25 | 30 days supply | Qty: 120 | Fill #1

## 2016-03-18 MED FILL — CELECOXIB 200 MG CAPSULE: 200 | 30 days supply | Qty: 30 | Fill #0

## 2016-03-24 MED FILL — LIOTHYRONINE SOD 5 MCG TAB: 5 | 90 days supply | Qty: 90 | Fill #3

## 2016-03-24 MED FILL — LEVOTHYROXINE 25 MCG TABLET: 25 | 90 days supply | Qty: 90 | Fill #3

## 2016-04-04 MED FILL — PARoxetine HCL 20 MG TABS: 20 | 30 days supply | Qty: 90 | Fill #0

## 2016-04-04 MED FILL — ARIPiprazole 15 MG TABS: 15 | 30 days supply | Qty: 45 | Fill #0

## 2016-04-05 DIAGNOSIS — F4310 Post-traumatic stress disorder, unspecified: Secondary | ICD-10-CM | POA: Diagnosis not present

## 2016-04-05 MED FILL — BYSTOLIC 10 MG TABLET: 10 | 90 days supply | Qty: 90 | Fill #0

## 2016-04-11 DIAGNOSIS — F4310 Post-traumatic stress disorder, unspecified: Secondary | ICD-10-CM | POA: Diagnosis not present

## 2016-04-21 DIAGNOSIS — F4310 Post-traumatic stress disorder, unspecified: Secondary | ICD-10-CM | POA: Diagnosis not present

## 2016-04-21 MED FILL — ALPRAZolam 0.25 MG TABS: 0.25 | 30 days supply | Qty: 120 | Fill #2

## 2016-04-25 DIAGNOSIS — F410 Panic disorder [episodic paroxysmal anxiety] without agoraphobia: Secondary | ICD-10-CM | POA: Diagnosis not present

## 2016-04-25 DIAGNOSIS — I10 Essential (primary) hypertension: Secondary | ICD-10-CM | POA: Diagnosis not present

## 2016-04-25 DIAGNOSIS — R7301 Impaired fasting glucose: Secondary | ICD-10-CM | POA: Diagnosis not present

## 2016-04-25 DIAGNOSIS — E039 Hypothyroidism, unspecified: Secondary | ICD-10-CM | POA: Diagnosis not present

## 2016-05-04 ENCOUNTER — Other Ambulatory Visit: Payer: Self-pay | Admitting: Internal Medicine

## 2016-05-04 MED FILL — PANTOPRAZOLE SOD DR 40 MG T: 40 | 90 days supply | Qty: 90 | Fill #2

## 2016-05-04 MED FILL — MONTELUKAST SOD 10 MG TAB: 10 | 90 days supply | Qty: 90 | Fill #0

## 2016-05-04 MED FILL — PARoxetine HCL 20 MG TABS: 20 | 30 days supply | Qty: 90 | Fill #1

## 2016-05-04 MED FILL — CELECOXIB 200 MG CAPSULE: 200 | 30 days supply | Qty: 30 | Fill #1

## 2016-05-11 DIAGNOSIS — F4310 Post-traumatic stress disorder, unspecified: Secondary | ICD-10-CM | POA: Diagnosis not present

## 2016-05-11 MED FILL — metFORMIN HCL 500 MG TABS: 500 | 30 days supply | Qty: 120 | Fill #0

## 2016-05-11 MED FILL — OLANZapine 20 MG TABS: 20 | 30 days supply | Qty: 30 | Fill #0

## 2016-05-17 MED FILL — ZOLPIDEM TARTRATE 10 MG TAB: 10 | 90 days supply | Qty: 90 | Fill #0

## 2016-05-24 DIAGNOSIS — F4310 Post-traumatic stress disorder, unspecified: Secondary | ICD-10-CM | POA: Diagnosis not present

## 2016-06-07 ENCOUNTER — Other Ambulatory Visit: Payer: Self-pay | Admitting: Internal Medicine

## 2016-06-07 MED FILL — OLANZapine 20 MG TABS: 20 | 30 days supply | Qty: 30 | Fill #1

## 2016-06-07 MED FILL — metFORMIN HCL 500 MG TABS: 500 | 30 days supply | Qty: 120 | Fill #1

## 2016-06-07 MED FILL — PARoxetine HCL 20 MG TABS: 20 | 30 days supply | Qty: 90 | Fill #0

## 2016-06-09 MED FILL — ALPRAZolam 0.25 MG TABS: 0.25 | 30 days supply | Qty: 120 | Fill #0

## 2016-06-09 MED FILL — MOMETASONE FUROATE 50 MCG S: 50 | 30 days supply | Qty: 17 | Fill #0

## 2016-06-29 DIAGNOSIS — E785 Hyperlipidemia, unspecified: Secondary | ICD-10-CM | POA: Diagnosis not present

## 2016-06-29 DIAGNOSIS — F411 Generalized anxiety disorder: Secondary | ICD-10-CM | POA: Diagnosis not present

## 2016-06-29 DIAGNOSIS — R7302 Impaired glucose tolerance (oral): Secondary | ICD-10-CM | POA: Diagnosis not present

## 2016-06-29 DIAGNOSIS — I10 Essential (primary) hypertension: Secondary | ICD-10-CM | POA: Diagnosis not present

## 2016-06-29 DIAGNOSIS — E039 Hypothyroidism, unspecified: Secondary | ICD-10-CM | POA: Diagnosis not present

## 2016-06-30 DIAGNOSIS — F4310 Post-traumatic stress disorder, unspecified: Secondary | ICD-10-CM | POA: Diagnosis not present

## 2016-06-30 MED FILL — OLANZapine 20 MG TABS: 20 | 30 days supply | Qty: 30 | Fill #0

## 2016-06-30 MED FILL — LORazepam 0.5 MG TABS: 0.5 | 30 days supply | Qty: 120 | Fill #0

## 2016-06-30 MED FILL — PARoxetine HCL 40 MG TABS: 40 | 30 days supply | Qty: 60 | Fill #0

## 2016-06-30 MED FILL — cloNIDine HCL 0.1 MG TABS: 0.1 | 30 days supply | Qty: 60 | Fill #0

## 2016-07-06 MED FILL — LEVOTHYROXINE 25 MCG TABLET: 25 | 90 days supply | Qty: 90 | Fill #0

## 2016-07-06 MED FILL — LIOTHYRONINE SOD 5 MCG TAB: 5 | 90 days supply | Qty: 90 | Fill #0

## 2016-07-11 DIAGNOSIS — F4310 Post-traumatic stress disorder, unspecified: Secondary | ICD-10-CM | POA: Diagnosis not present

## 2016-07-27 MED FILL — OLANZapine 20 MG TABS: 20 | 30 days supply | Qty: 30 | Fill #1

## 2016-07-27 MED FILL — PARoxetine HCL 40 MG TABS: 40 | 30 days supply | Qty: 60 | Fill #1

## 2016-07-27 MED FILL — cloNIDine HCL 0.1 MG TABS: 0.1 | 30 days supply | Qty: 60 | Fill #1

## 2016-07-28 MED FILL — BYSTOLIC 10 MG TABLET: 10 | 90 days supply | Qty: 90 | Fill #1

## 2016-07-29 MED FILL — metFORMIN HCL 500 MG TABS: 500 | 30 days supply | Qty: 120 | Fill #0

## 2016-08-03 DIAGNOSIS — F4310 Post-traumatic stress disorder, unspecified: Secondary | ICD-10-CM | POA: Diagnosis not present

## 2016-08-03 MED FILL — LORazepam 1 MG TABS: 1 | 20 days supply | Qty: 60 | Fill #0

## 2016-08-03 MED FILL — GABAPENTIN 300 MG CAPSULE: 300 | 30 days supply | Qty: 60 | Fill #0

## 2016-09-05 MED FILL — metFORMIN HCL 500 MG TABS: 500 | 30 days supply | Qty: 120 | Fill #1

## 2016-09-05 MED FILL — OLANZapine 20 MG TABS: 20 | 30 days supply | Qty: 30 | Fill #0

## 2016-09-05 MED FILL — GABAPENTIN 300 MG CAPSULE: 300 | 30 days supply | Qty: 60 | Fill #1

## 2016-09-05 MED FILL — LORazepam 1 MG TABS: 1 | 20 days supply | Qty: 60 | Fill #1

## 2016-09-08 DIAGNOSIS — F4310 Post-traumatic stress disorder, unspecified: Secondary | ICD-10-CM | POA: Diagnosis not present

## 2016-09-13 MED FILL — PARoxetine HCL 40 MG TABS: 40 | 30 days supply | Qty: 60 | Fill #0

## 2016-09-19 MED FILL — GABAPENTIN 300 MG CAPSULE: 300 | 30 days supply | Qty: 180 | Fill #0

## 2016-09-28 DIAGNOSIS — E039 Hypothyroidism, unspecified: Secondary | ICD-10-CM | POA: Diagnosis not present

## 2016-09-28 DIAGNOSIS — R609 Edema, unspecified: Secondary | ICD-10-CM | POA: Diagnosis not present

## 2016-09-28 DIAGNOSIS — I10 Essential (primary) hypertension: Secondary | ICD-10-CM | POA: Diagnosis not present

## 2016-09-28 DIAGNOSIS — R5383 Other fatigue: Secondary | ICD-10-CM | POA: Diagnosis not present

## 2016-09-29 DIAGNOSIS — F4310 Post-traumatic stress disorder, unspecified: Secondary | ICD-10-CM | POA: Diagnosis not present

## 2016-09-30 ENCOUNTER — Other Ambulatory Visit (HOSPITAL_COMMUNITY): Payer: Self-pay | Admitting: Family Medicine

## 2016-09-30 DIAGNOSIS — R609 Edema, unspecified: Secondary | ICD-10-CM

## 2016-09-30 DIAGNOSIS — R600 Localized edema: Secondary | ICD-10-CM

## 2016-10-03 ENCOUNTER — Encounter: Payer: Self-pay | Admitting: Cardiology

## 2016-10-05 ENCOUNTER — Ambulatory Visit (HOSPITAL_COMMUNITY)
Admission: RE | Admit: 2016-10-05 | Discharge: 2016-10-05 | Disposition: A | Discharge status: Home / Self Care | Payer: 59 | Source: Ambulatory Visit | Attending: Family Medicine | Admitting: Family Medicine

## 2016-10-05 ENCOUNTER — Ambulatory Visit (HOSPITAL_COMMUNITY): Payer: 59

## 2016-10-05 DIAGNOSIS — K5730 Diverticulosis of large intestine without perforation or abscess without bleeding: Secondary | ICD-10-CM | POA: Insufficient documentation

## 2016-10-05 DIAGNOSIS — K449 Diaphragmatic hernia without obstruction or gangrene: Secondary | ICD-10-CM | POA: Insufficient documentation

## 2016-10-05 DIAGNOSIS — R945 Abnormal results of liver function studies: Secondary | ICD-10-CM | POA: Diagnosis not present

## 2016-10-05 DIAGNOSIS — R748 Abnormal levels of other serum enzymes: Secondary | ICD-10-CM | POA: Diagnosis not present

## 2016-10-05 DIAGNOSIS — R600 Localized edema: Secondary | ICD-10-CM | POA: Insufficient documentation

## 2016-10-05 DIAGNOSIS — R609 Edema, unspecified: Secondary | ICD-10-CM

## 2016-10-05 MED ORDER — IOPAMIDOL (ISOVUE-300) INJECTION 61%
100.0000 mL | Freq: Once | INTRAVENOUS | Status: AC | PRN
  Administered 2016-10-05: 100 mL via INTRAVENOUS

## 2016-10-06 ENCOUNTER — Ambulatory Visit: Payer: 59 | Admitting: Cardiology

## 2016-10-06 MED FILL — PANTOPRAZOLE SOD DR 40 MG T: 40 | 90 days supply | Qty: 90 | Fill #0

## 2016-10-06 MED FILL — LORazepam 1 MG TABS: 1 | 25 days supply | Qty: 75 | Fill #0

## 2016-10-10 MED FILL — LIOTHYRONINE SOD 5 MCG TAB: 5 | 90 days supply | Qty: 90 | Fill #1

## 2016-10-10 MED FILL — OLANZapine 20 MG TABS: 20 | 30 days supply | Qty: 30 | Fill #1

## 2016-10-10 MED FILL — metFORMIN HCL 500 MG TABS: 500 | 30 days supply | Qty: 120 | Fill #2

## 2016-10-10 MED FILL — LEVOTHYROXINE 25 MCG TABLET: 25 | 90 days supply | Qty: 90 | Fill #1

## 2016-10-11 DIAGNOSIS — H5212 Myopia, left eye: Secondary | ICD-10-CM | POA: Diagnosis not present

## 2016-10-11 DIAGNOSIS — H5211 Myopia, right eye: Secondary | ICD-10-CM | POA: Diagnosis not present

## 2016-10-11 DIAGNOSIS — H524 Presbyopia: Secondary | ICD-10-CM | POA: Diagnosis not present

## 2016-10-11 DIAGNOSIS — H52222 Regular astigmatism, left eye: Secondary | ICD-10-CM | POA: Diagnosis not present

## 2016-10-13 DIAGNOSIS — R609 Edema, unspecified: Secondary | ICD-10-CM | POA: Diagnosis not present

## 2016-10-14 ENCOUNTER — Ambulatory Visit: Payer: 59 | Admitting: Cardiology

## 2016-10-16 MED FILL — PARoxetine HCL 40 MG TABS: 40 | 30 days supply | Qty: 60 | Fill #1

## 2016-10-17 MED FILL — GABAPENTIN 300 MG CAPSULE: 300 | 30 days supply | Qty: 180 | Fill #1

## 2016-10-18 ENCOUNTER — Ambulatory Visit: Payer: 59 | Admitting: Physician Assistant

## 2016-10-19 ENCOUNTER — Encounter: Payer: Self-pay | Admitting: Physician Assistant

## 2016-10-19 ENCOUNTER — Ambulatory Visit (INDEPENDENT_AMBULATORY_CARE_PROVIDER_SITE_OTHER): Payer: 59 | Admitting: Physician Assistant

## 2016-10-19 VITALS — BP 100/70 | HR 60 | Ht 61.0 in | Wt 194.0 lb

## 2016-10-19 DIAGNOSIS — R600 Localized edema: Secondary | ICD-10-CM | POA: Diagnosis not present

## 2016-10-19 DIAGNOSIS — I10 Essential (primary) hypertension: Secondary | ICD-10-CM | POA: Diagnosis not present

## 2016-10-19 DIAGNOSIS — J45909 Unspecified asthma, uncomplicated: Secondary | ICD-10-CM | POA: Diagnosis not present

## 2016-10-19 NOTE — Progress Notes (Signed)
Cardiology Office Note:    Date:  10/19/2016   ID:  [name] [name], DOB [birth year], MRN [id number]  PCP:  Talmage Coin, MD  Cardiologist:  Dr. Liam Rogers   Electrophysiologist:  n/a  Referring MD: Talmage Coin, MD   Chief Complaint  Patient presents with  . Leg Swelling    History of Present Illness:    [name] [name] is a 60 y.o. female with a hx of COPD with chronic bronchitis, hypothyroidism, GERD, depression/anxiety.  She was evaluated by Dr. Liam Rogers in 1/16 for palpitations. Holter was normal.  Echo was normal. PRN follow up was recommended.  She is referred back by her PCP for leg edema.  Creatinine was normal on blood work. Abdominal and pelvic CT 10/05/16 demonstrated no significant findings.   The patient was placed on gabapentin 900 mg twice a day by her psychiatrist for treatment of anxiety. Shortly after this, she developed significant LE edema in both legs. Since being placed on Lasix by PCP, her edema is much improved. She denies any significant changes in her dyspnea. She denies any chest discomfort, syncope, orthopnea, PND.  Prior CV studies that were reviewed today include:    Aorta Duplex 1/16 Normal caliber abdominal aorta, common and external iliac arteries, without focal stenosis, or dilatation. Patent IVC.  Echo 1/16 EF 60-65, no RWMA, Gr 1 DD, mild LAE, trivial pericardial eff post to heart  Holter 12/15 NSR, PACs  Cardiopulmonary Stress Test 7/13 Conclusion: Exercise testing with gas exchange demonstrates a normal functional capacity when compared to matched sedentary norms. There does not appear to be an overt ventilatory limitation to the exercise. There appears to be a significant limitation related to her body habitus.   Past Medical History:  Diagnosis Date  . Anxiety   . Bronchiectasis   . Bronchitis    chronic  . 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 COPD (chronic obstructive pulmonary disease) ([place])   . Dental  crown present    upper  . Depression   . Environmental allergies    receives allergy shots  . GERD (gastroesophageal reflux disease)   . 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 - no current med.  . Glucose intolerance (impaired glucose tolerance)    on Metformin  . H/O hiatal hernia    "sliding"  . History of echocardiogram    Echo 1/16: EF 60-65, no RWMA, Gr 1 DD, mild LAE, trivial pericardial eff post to heart  . History of palpitations    Holter 12/15: NSR, PACs  . Hx of migraines   . Hypothyroidism   . Osteoarthritis    knees  . Positional headache since 1997   if lies on left side    Past Surgical History:  Procedure Laterality Date  . CHOLECYSTECTOMY  04/21/2011  . CHONDROPLASTY  10/19/2012   Procedure: CHONDROPLASTY;  Surgeon: Yvette Rack., MD;  Location: [facility];  Service: Orthopedics;  Laterality: Right;  . COLONOSCOPY    . KNEE ARTHROSCOPY  10/19/2012   Procedure: ARTHROSCOPY KNEE;  Surgeon: Yvette Rack., MD;  Location: [facility];  Service: Orthopedics;  Laterality: Right;  . KNEE ARTHROSCOPY Right 02/01/2013   Procedure: RIGHT KNEE ARTHROSCOPY WITH MEDIAL MENISCECTOMY, DEBRIDEMENT CHONDROMALACIA PATELLA;  Surgeon: Yvette Rack., MD;  Location: [facility];  Service: Orthopedics;  Laterality: Right;  RIGHT KNEE ARTHROSCOPY WITH MEDIAL MENISCECTOMY, DEBRIDEMENT CHONDROMALACIA PATELLA  . KNEE ARTHROSCOPY  Left 03/15/2013   Procedure: LEFT KNEE ARTHROSCOPY WITH DEBRIDEMENT/SHAVING CHONDROPLASTY WITH MEDIAL MENISECTOMY;  Surgeon: Yvette Rack., MD;  Location: [facility];  Service: Orthopedics;  Laterality: Left;  . KNEE ARTHROSCOPY WITH LATERAL MENISECTOMY  10/19/2012   Procedure: KNEE ARTHROSCOPY WITH LATERAL MENISECTOMY;  Surgeon: Yvette Rack., MD;  Location: [facility];  Service: Orthopedics;  Laterality: Right;  . TONSILLECTOMY AND ADENOIDECTOMY     age 30  . TUBAL LIGATION   1986  . WISDOM TOOTH EXTRACTION      Current Medications: Current Meds  Medication Sig  . albuterol (PROAIR HFA) 108 (90 BASE) MCG/ACT inhaler 2 puffs every 4 hours as needed only  if your can't catch your breath  . albuterol (PROVENTIL) (2.5 MG/3ML) 0.083% nebulizer solution Take 3 mLs (2.5 mg total) by nebulization every 6 (six) hours as needed for wheezing or shortness of breath.  Marland Kitchen albuterol (VENTOLIN HFA) 108 (90 BASE) MCG/ACT inhaler Inhale 2 puffs into the lungs every 6 (six) hours as needed for wheezing or shortness of breath.   . ALPRAZolam (XANAX) 0.25 MG tablet Take 0.125-0.25 mg by mouth daily. As needed anxiety  . azelastine (ASTELIN) 0.1 % nasal spray Place into both nostrils 2 (two) times daily. Use in each nostril as directed  . B Complex Vitamins (B-COMPLEX/B-12) LIQD Place 5 mLs under the tongue daily at 2 PM daily at 2 PM.  . Cholecalciferol (VITAMIN D) 2000 UNITS tablet Take 5,000 Units by mouth daily.   Marland Kitchen doxycycline (VIBRA-TABS) 100 MG tablet Take 2 tablets today then 1 tablet daily till gone.  . furosemide (LASIX) 20 MG tablet Take 20 mg by mouth daily.  . Iodine, Kelp, TABS Take 1 tablet by mouth daily.  Marland Kitchen ipratropium (ATROVENT) 0.02 % nebulizer solution Take 2.5 mLs (0.5 mg total) by nebulization every 6 (six) hours as needed for wheezing or shortness of breath.  Marland Kitchen ipratropium (ATROVENT) 0.03 % nasal spray Place 2 sprays into the nose every 12 (twelve) hours as needed for rhinitis.   Marland Kitchen ipratropium-albuterol (DUONEB) 0.5-2.5 (3) MG/3ML SOLN Take 3 mLs by nebulization every 6 (six) hours as needed (wheezinf or shortness of breath (sob)).  Marland Kitchen levothyroxine (SYNTHROID, LEVOTHROID) 25 MCG tablet Take 25 mcg by mouth daily before breakfast.  . liothyronine (CYTOMEL) 5 MCG tablet Take 5 mcg by mouth daily.  . magnesium oxide (MAG-OX) 400 MG tablet Take 800 mg by mouth daily.  . mometasone (NASONEX) 50 MCG/ACT nasal spray PLACE 2 SPRAYS INTO THE NOSE DAILY.  . montelukast  (SINGULAIR) 10 MG tablet TAKE 1 TABLET BY MOUTH ONCE DAILY  . Multiple Vitamins-Minerals (MULTIVITAMIN ADULT PO) Take 1 tablet by mouth daily.  . nebivolol (BYSTOLIC) 10 MG tablet Take 1 tablet (10 mg total) by mouth daily.  . Nebulizers (NEBULIZER COMPRESSOR) MISC Use as directed  . NONFORMULARY OR COMPOUNDED ITEM Allergy Vaccine 1:50000 build up (01/19/15) Given at [facility]  . OLANZapine (ZYPREXA) 20 MG tablet Take 20 mg by mouth at bedtime.  . pantoprazole (PROTONIX) 40 MG tablet Take 1 tablet (40 mg total) by mouth 2 (two) times daily before a meal.  . PARoxetine HCl (PAXIL PO) Take 80 mg by mouth daily.  . Probiotic Product (PROBIOTIC DAILY) CAPS Take 1 capsule by mouth daily.  . Travoprost, BAK Free, (TRAVATAN) 0.004 % SOLN ophthalmic solution Place 1 drop into both eyes at bedtime.  . vitamin A 10000 UNIT capsule Take 10,000 Units by mouth daily.  Allergies:   Levaquin [levofloxacin] and Nickel   Social History   Social History  . Marital status: Married    Spouse name: N/A  . Number of children: N/A  . Years of education: N/A   Occupational History  . nurse  RN [employer]   Social History Main Topics  . Smoking status: Never Smoker  . Smokeless tobacco: Never Used  . Alcohol use 1.0 oz/week    2 drink(s) per week     Comment: occasionally  . Drug use: No  . Sexual activity: Yes    Partners: Male    Birth control/ protection: Post-menopausal   Other Topics Concern  . None   Social History Narrative  . None     Family History:  The patient's family history includes COPD (age of onset: 36) in her brother; Emphysema in her father; Hypertension in her sister; Lung cancer (age of onset: 68) in her father; Stroke in her mother; Thyroid disease in her sister.   ROS:   Please see the history of present illness.    Review of Systems  Cardiovascular: Positive for leg swelling.  Psychiatric/Behavioral: Positive for depression.   All other systems reviewed  and are negative.   EKGs/Labs/Other Test Reviewed:    EKG:  EKG is  ordered today.  The ekg ordered today demonstrates NSR, HR 61, normal axis, low voltage, QTc 428 ms  Recent Labs: No results found for requested labs within last 8760 hours.  Labs from PCP 10/13/16:  BUN 24, creatinine 0.80, K 4.3 Labs from PCP 06/29/16:  TC 231, TG 79, HDL 69, LDL 146, Hgb 13.7, ALT 20, TSH 0.58  Recent Lipid Panel    Component Value Date/Time   CHOL 247 (H) 02/05/2015 0850   TRIG 82 02/05/2015 0850   HDL 79 02/05/2015 0850   CHOLHDL 3.1 02/05/2015 0850   VLDL 16 02/05/2015 0850   LDLCALC 152 (H) 02/05/2015 0850   Abdominal/pelvic CT 1. No evidence of acute inflammatory process within abdomen. 2. Normal appendix.  No pericecal inflammation. 3. No small bowel obstruction. 4. Colonic stool as described above. No evidence of colonic obstruction. 5. Few diverticula sigmoid colon. No evidence of acute colitis or diverticulitis. 6. Degenerative changes thoracolumbar spine.  Physical Exam:    VS:  BP 100/70   Pulse 60   Ht 5\' 1"  (1.549 m)   Wt 194 lb (88 kg)   LMP 12/12/2005   BMI 36.66 kg/m     Wt Readings from Last 3 Encounters:  10/19/16 194 lb (88 kg)  01/14/15 152 lb (68.9 kg)  01/12/15 153 lb (69.4 kg)     Physical Exam  Constitutional: She is oriented to person, place, and time. She appears well-developed and well-nourished. No distress.  HENT:  Head: Normocephalic and atraumatic.  Eyes: No scleral icterus.  Neck: Normal range of motion. No JVD present.  Cardiovascular: Normal rate, regular rhythm, S1 normal, S2 normal and normal heart sounds.   No murmur heard. Pulmonary/Chest: Breath sounds normal. She has no wheezes. She has no rhonchi. She has no rales.  Abdominal: Soft. There is no tenderness.  Musculoskeletal: She exhibits edema.  Trace-1+ bilateral LE edema  Neurological: She is alert and oriented to person, place, and time.  Skin: Skin is warm and dry.    Psychiatric: She has a normal mood and affect.    ASSESSMENT:    1. Bilateral leg edema   2. Asthma, unspecified asthma severity, unspecified whether complicated, unspecified whether persistent  3. Essential hypertension    PLAN:    In order of problems listed above:  1. Edema - I suspect her edema is all secondary to gabapentin. There is a 2-8% incidence of peripheral edema with gabapentin. She does have significant lung disease and could be at risk for pulmonary hypertension and right-sided heart failure. Overall, she does not appear to be volume overloaded. She may have an element of venous insufficiency. I have recommended repeating her echocardiogram to reassess her pulmonary pressures and right ventricular function. She may remain on Lasix as long as she can tolerate this. She can follow-up with Dr. Acie Fredrickson as needed or sooner if her echocardiogram is abnormal.  2. Asthma - Follow-up with pulmonology and PCP as directed.  3. HTN - Blood pressure controlled on Bystolic.   Medication Adjustments/Labs and Tests Ordered: Current medicines are reviewed at length with the patient today.  Concerns regarding medicines are outlined above.  Medication changes, Labs and Tests ordered today are outlined in the Patient Instructions noted below. Patient Instructions  Medication Instructions:  Your physician recommends that you continue on your current medications as directed. Please refer to the Current Medication list given to you today.  Labwork: NONE  Testing/Procedures: Your physician has requested that you have an echocardiogram. Echocardiography is a painless test that uses sound waves to create images of your heart. It provides your doctor with information about the size and shape of your heart and how well your heart's chambers and valves are working. This procedure takes approximately one hour. There are no restrictions for this procedure.  Follow-Up: DR. Acie Fredrickson AS NEEDED  Any  Other Special Instructions Will Be Listed Below (If Applicable).  If you need a refill on your cardiac medications before your next appointment, please call your pharmacy.  Signed, Richardson Dopp, PA-C  10/19/2016 4:41 PM    [facility] [address], [place], [place]  [address] Phone: [phone]; Fax: [phone]

## 2016-10-19 NOTE — Patient Instructions (Addendum)
Medication Instructions:  Your physician recommends that you continue on your current medications as directed. Please refer to the Current Medication list given to you today.  Labwork: NONE  Testing/Procedures: Your physician has requested that you have an echocardiogram. Echocardiography is a painless test that uses sound waves to create images of your heart. It provides your doctor with information about the size and shape of your heart and how well your heart's chambers and valves are working. This procedure takes approximately one hour. There are no restrictions for this procedure.  Follow-Up: DR. Acie Fredrickson AS NEEDED  Any Other Special Instructions Will Be Listed Below (If Applicable).  If you need a refill on your cardiac medications before your next appointment, please call your pharmacy.

## 2016-10-25 DIAGNOSIS — L03213 Periorbital cellulitis: Secondary | ICD-10-CM | POA: Diagnosis not present

## 2016-10-25 DIAGNOSIS — H40013 Open angle with borderline findings, low risk, bilateral: Secondary | ICD-10-CM | POA: Diagnosis not present

## 2016-10-27 DIAGNOSIS — F4310 Post-traumatic stress disorder, unspecified: Secondary | ICD-10-CM | POA: Diagnosis not present

## 2016-10-27 MED FILL — GABAPENTIN 600 MG TABLET: 600 | 30 days supply | Qty: 120 | Fill #0

## 2016-10-27 MED FILL — SERTRALINE HCL 100 MG TAB: 100 | 37 days supply | Qty: 60 | Fill #0

## 2016-10-27 MED FILL — TRAVATAN Z 0.004% EYE DROP: 0.004 | 25 days supply | Qty: 3 | Fill #0

## 2016-11-07 MED FILL — BYSTOLIC 10 MG TABLET: 10 | 90 days supply | Qty: 90 | Fill #2

## 2016-11-07 MED FILL — LORazepam 1 MG TABS: 1 | 25 days supply | Qty: 75 | Fill #1

## 2016-11-08 MED FILL — metFORMIN HCL 500 MG TABS: 500 | 30 days supply | Qty: 120 | Fill #0

## 2016-11-08 MED FILL — OLANZapine 20 MG TABS: 20 | 30 days supply | Qty: 30 | Fill #0

## 2016-11-09 ENCOUNTER — Other Ambulatory Visit: Payer: Self-pay

## 2016-11-09 ENCOUNTER — Ambulatory Visit (HOSPITAL_COMMUNITY): Payer: 59 | Attending: Cardiology

## 2016-11-09 DIAGNOSIS — R600 Localized edema: Secondary | ICD-10-CM | POA: Diagnosis not present

## 2016-11-09 DIAGNOSIS — R002 Palpitations: Secondary | ICD-10-CM | POA: Insufficient documentation

## 2016-11-09 DIAGNOSIS — R0600 Dyspnea, unspecified: Secondary | ICD-10-CM | POA: Diagnosis not present

## 2016-11-10 ENCOUNTER — Encounter: Payer: Self-pay | Admitting: Physician Assistant

## 2016-11-14 DIAGNOSIS — H04123 Dry eye syndrome of bilateral lacrimal glands: Secondary | ICD-10-CM | POA: Diagnosis not present

## 2016-11-14 DIAGNOSIS — H40023 Open angle with borderline findings, high risk, bilateral: Secondary | ICD-10-CM | POA: Diagnosis not present

## 2016-11-17 ENCOUNTER — Other Ambulatory Visit (HOSPITAL_COMMUNITY): Payer: Self-pay | Admitting: Family Medicine

## 2016-11-17 ENCOUNTER — Ambulatory Visit (HOSPITAL_COMMUNITY)
Admission: RE | Admit: 2016-11-17 | Discharge: 2016-11-17 | Disposition: A | Discharge status: Home / Self Care | Payer: 59 | Source: Ambulatory Visit | Attending: Family Medicine | Admitting: Family Medicine

## 2016-11-17 DIAGNOSIS — Z9049 Acquired absence of other specified parts of digestive tract: Secondary | ICD-10-CM | POA: Diagnosis not present

## 2016-11-17 DIAGNOSIS — I10 Essential (primary) hypertension: Secondary | ICD-10-CM

## 2016-11-17 DIAGNOSIS — J479 Bronchiectasis, uncomplicated: Secondary | ICD-10-CM | POA: Insufficient documentation

## 2016-11-23 DIAGNOSIS — F4310 Post-traumatic stress disorder, unspecified: Secondary | ICD-10-CM | POA: Diagnosis not present

## 2016-11-24 MED FILL — GABAPENTIN 600 MG TABLET: 600 | 30 days supply | Qty: 120 | Fill #1

## 2016-11-29 MED FILL — SERTRALINE HCL 100 MG TAB: 100 | 30 days supply | Qty: 90 | Fill #0

## 2016-11-30 DIAGNOSIS — R609 Edema, unspecified: Secondary | ICD-10-CM | POA: Diagnosis not present

## 2016-11-30 DIAGNOSIS — I10 Essential (primary) hypertension: Secondary | ICD-10-CM | POA: Diagnosis not present

## 2016-11-30 DIAGNOSIS — F418 Other specified anxiety disorders: Secondary | ICD-10-CM | POA: Diagnosis not present

## 2016-12-13 MED FILL — CELECOXIB 200 MG CAPSULE: 200 | 30 days supply | Qty: 30 | Fill #2

## 2016-12-14 DIAGNOSIS — F4310 Post-traumatic stress disorder, unspecified: Secondary | ICD-10-CM | POA: Diagnosis not present

## 2016-12-15 MED FILL — metFORMIN HCL 500 MG TABS: 500 | 30 days supply | Qty: 120 | Fill #1

## 2016-12-19 MED FILL — MOMETASONE FUROATE 50 MCG S: 50 | 30 days supply | Qty: 17 | Fill #1

## 2016-12-19 MED FILL — LORazepam 1 MG TABS: 1 | 25 days supply | Qty: 75 | Fill #0

## 2016-12-22 MED FILL — GABAPENTIN 600 MG TABLET: 600 | 30 days supply | Qty: 180 | Fill #0

## 2017-01-02 MED FILL — SERTRALINE HCL 100 MG TAB: 100 | 30 days supply | Qty: 90 | Fill #1

## 2017-01-03 MED FILL — LIOTHYRONINE SOD 5 MCG TAB: 5 | 90 days supply | Qty: 90 | Fill #2

## 2017-01-03 MED FILL — LEVOTHYROXINE 25 MCG TABLET: 25 | 90 days supply | Qty: 90 | Fill #2

## 2017-01-03 MED FILL — PANTOPRAZOLE SOD DR 40 MG T: 40 | 90 days supply | Qty: 90 | Fill #1

## 2017-01-04 MED FILL — OLANZapine 10 MG TABS: 10 | 30 days supply | Qty: 30 | Fill #0

## 2017-01-16 DIAGNOSIS — F4310 Post-traumatic stress disorder, unspecified: Secondary | ICD-10-CM | POA: Diagnosis not present

## 2017-01-22 MED FILL — GABAPENTIN 600 MG TABLET: 600 | 30 days supply | Qty: 180 | Fill #1

## 2017-02-03 DIAGNOSIS — R609 Edema, unspecified: Secondary | ICD-10-CM | POA: Diagnosis not present

## 2017-02-03 DIAGNOSIS — I10 Essential (primary) hypertension: Secondary | ICD-10-CM | POA: Diagnosis not present

## 2017-02-03 DIAGNOSIS — E785 Hyperlipidemia, unspecified: Secondary | ICD-10-CM | POA: Diagnosis not present

## 2017-02-03 DIAGNOSIS — R7302 Impaired glucose tolerance (oral): Secondary | ICD-10-CM | POA: Diagnosis not present

## 2017-02-03 DIAGNOSIS — F418 Other specified anxiety disorders: Secondary | ICD-10-CM | POA: Diagnosis not present

## 2017-02-09 MED FILL — LORazepam 1 MG TABS: 1 | 25 days supply | Qty: 75 | Fill #0

## 2017-02-09 MED FILL — SERTRALINE HCL 100 MG TAB: 100 | 30 days supply | Qty: 90 | Fill #0

## 2017-02-13 MED FILL — BYSTOLIC 10 MG TABLET: 10 | 90 days supply | Qty: 90 | Fill #3

## 2017-02-14 MED FILL — OLANZapine 10 MG TABS: 10 | 30 days supply | Qty: 30 | Fill #0

## 2017-03-03 MED FILL — GABAPENTIN 600 MG TABLET: 600 | 30 days supply | Qty: 120 | Fill #0

## 2017-03-14 DIAGNOSIS — R635 Abnormal weight gain: Secondary | ICD-10-CM | POA: Diagnosis not present

## 2017-03-14 DIAGNOSIS — I10 Essential (primary) hypertension: Secondary | ICD-10-CM | POA: Diagnosis not present

## 2017-03-14 DIAGNOSIS — E039 Hypothyroidism, unspecified: Secondary | ICD-10-CM | POA: Diagnosis not present

## 2017-03-14 DIAGNOSIS — F418 Other specified anxiety disorders: Secondary | ICD-10-CM | POA: Diagnosis not present

## 2017-03-16 DIAGNOSIS — F4310 Post-traumatic stress disorder, unspecified: Secondary | ICD-10-CM | POA: Diagnosis not present

## 2017-03-16 MED FILL — SERTRALINE HCL 100 MG TAB: 100 | 30 days supply | Qty: 90 | Fill #0

## 2017-03-16 MED FILL — OLANZapine 7.5 MG TABS: 7.5 | 30 days supply | Qty: 30 | Fill #0

## 2017-03-17 MED FILL — MONTELUKAST SOD 10 MG TAB: 10 | 90 days supply | Qty: 90 | Fill #0

## 2017-04-03 MED FILL — MOMETASONE FUROATE 50 MCG S: 50 | 30 days supply | Qty: 17 | Fill #2

## 2017-04-03 MED FILL — TRAVATAN Z 0.004% EYE DROP: 0.004 | 30 days supply | Qty: 3 | Fill #1

## 2017-04-03 MED FILL — GABAPENTIN 600 MG TABLET: 600 | 30 days supply | Qty: 120 | Fill #0

## 2017-04-12 MED FILL — LIOTHYRONINE SOD 5 MCG TAB: 5 | 90 days supply | Qty: 90 | Fill #3

## 2017-04-12 MED FILL — PANTOPRAZOLE SOD DR 40 MG T: 40 | 90 days supply | Qty: 90 | Fill #2

## 2017-04-12 MED FILL — SERTRALINE HCL 100 MG TAB: 100 | 30 days supply | Qty: 90 | Fill #1

## 2017-04-12 MED FILL — LEVOTHYROXINE 25 MCG TABLET: 25 | 90 days supply | Qty: 90 | Fill #3

## 2017-04-13 MED FILL — LORazepam 1 MG TABS: 1 | 25 days supply | Qty: 75 | Fill #0

## 2017-04-14 MED FILL — OLANZapine 5 MG TABS: 5 | 30 days supply | Qty: 30 | Fill #0

## 2017-04-27 ENCOUNTER — Telehealth: Payer: 59 | Admitting: Nurse Practitioner

## 2017-04-27 DIAGNOSIS — J019 Acute sinusitis, unspecified: Secondary | ICD-10-CM | POA: Diagnosis not present

## 2017-04-27 DIAGNOSIS — F4310 Post-traumatic stress disorder, unspecified: Secondary | ICD-10-CM | POA: Diagnosis not present

## 2017-04-27 MED ORDER — FLUTICASONE PROPIONATE 50 MCG/ACT NA SUSP: 2.0000 | Freq: Every day | NASAL | 0 refills | Status: DC

## 2017-04-27 MED ORDER — AMOXICILLIN-POT CLAVULANATE 875-125 MG PO TABS: 1.0000 | ORAL_TABLET | Freq: Two times a day (BID) | ORAL | 0 refills | Status: AC

## 2017-04-27 NOTE — Progress Notes (Signed)
We are sorry that you are not feeling well.  Here is how we plan to help!  Based on what you have shared with me it looks like you have sinusitis.  Sinusitis is inflammation and infection in the sinus cavities of the head.  Based on your presentation I believe you most likely have Acute Bacterial Sinusitis.  This is an infection caused by bacteria and is treated with antibiotics. I have prescribed Augmentin 875mg /125mg  one tablet twice daily with food, for 10 days. You may use an oral decongestant such as Mucinex D or if you have glaucoma or high blood pressure use plain Mucinex. I am also prescribing a nasal steroid spray, Flonase.  Please use 2 sprays in each nostril daily for 10 days. Flonase is a steroid nasal spray help and can safely be used as often as needed for congestion.  If you develop worsening sinus pain, fever or notice severe headache and vision changes, or if symptoms are not better after completion of antibiotic, please schedule an appointment with a health care provider.    Sinus infections are not as easily transmitted as other respiratory infection, however we still recommend that you avoid close contact with loved ones, especially the very young and elderly.  Remember to wash your hands thoroughly throughout the day as this is the number one way to prevent the spread of infection!   Home Care:  Only take medications as instructed by your medical team.  Complete the entire course of an antibiotic.  Do not take these medications with alcohol.  A steam or ultrasonic humidifier can help congestion.  You can place a towel over your head and breathe in the steam from hot water coming from a faucet.  Avoid close contacts especially the very young and the elderly.  Cover your mouth when you cough or sneeze.  Always remember to wash your hands.  Get Help Right Away If:  You develop worsening fever or sinus pain.  You develop a severe head ache or visual changes.  Your  symptoms persist after you have completed your treatment plan.  Make sure you  Understand these instructions.  Will watch your condition.  Will get help right away if you are not doing well or get worse.  Your e-visit answers were reviewed by a board certified advanced clinical practitioner to complete your personal care plan.  Depending on the condition, your plan could have included both over the counter or prescription medications.  If there is a problem please reply once you have received a response from your provider.  Your safety is important to [place].  If you have drug allergies check your prescription carefully.    You can use MyChart to ask questions about today's visit, request a non-urgent call back, or ask for a work or school excuse for 24 hours related to this e-Visit. If it has been greater than 24 hours you will need to follow up with your provider, or enter a new e-Visit to address those concerns.  You will get an e-mail in the next two days asking about your experience.  I hope that your e-visit has been valuable and will speed your recovery. Thank you for using e-visits.

## 2017-05-05 DIAGNOSIS — F418 Other specified anxiety disorders: Secondary | ICD-10-CM | POA: Diagnosis not present

## 2017-05-05 DIAGNOSIS — I10 Essential (primary) hypertension: Secondary | ICD-10-CM | POA: Diagnosis not present

## 2017-05-05 DIAGNOSIS — E669 Obesity, unspecified: Secondary | ICD-10-CM | POA: Diagnosis not present

## 2017-05-18 MED FILL — SERTRALINE HCL 100 MG TAB: 100 | 30 days supply | Qty: 90 | Fill #2

## 2017-05-24 MED FILL — TRAVATAN Z 0.004% EYE DROP: 0.004 | 30 days supply | Qty: 3 | Fill #2

## 2017-05-24 MED FILL — MOMETASONE FUROATE 50 MCG S: 50 | 30 days supply | Qty: 17 | Fill #3

## 2017-05-29 DIAGNOSIS — F4310 Post-traumatic stress disorder, unspecified: Secondary | ICD-10-CM | POA: Diagnosis not present

## 2017-06-07 MED FILL — LORazepam 1 MG TABS: 1 | 25 days supply | Qty: 75 | Fill #0

## 2017-06-07 MED FILL — BYSTOLIC 10 MG TABLET: 10 | 90 days supply | Qty: 90 | Fill #0

## 2017-06-19 MED FILL — SERTRALINE HCL 100 MG TAB: 100 | 30 days supply | Qty: 90 | Fill #3

## 2017-06-19 MED FILL — MONTELUKAST SOD 10 MG TAB: 10 | 90 days supply | Qty: 90 | Fill #1

## 2017-06-30 DIAGNOSIS — Z789 Other specified health status: Secondary | ICD-10-CM | POA: Diagnosis not present

## 2017-06-30 DIAGNOSIS — W57XXXA Bitten or stung by nonvenomous insect and other nonvenomous arthropods, initial encounter: Secondary | ICD-10-CM | POA: Diagnosis not present

## 2017-06-30 DIAGNOSIS — Z139 Encounter for screening, unspecified: Secondary | ICD-10-CM | POA: Diagnosis not present

## 2017-07-04 DIAGNOSIS — F4310 Post-traumatic stress disorder, unspecified: Secondary | ICD-10-CM | POA: Diagnosis not present

## 2017-07-04 MED FILL — PHENTERMINE 37.5 MG TABLET: 37.5 | 33 days supply | Qty: 30 | Fill #0

## 2017-07-04 MED FILL — GABAPENTIN 300 MG CAPSULE: 300 | 30 days supply | Qty: 120 | Fill #0

## 2017-07-10 MED FILL — VRAYLAR 1.5 MG CAPSULE: 1.5 | 30 days supply | Qty: 30 | Fill #0

## 2017-07-14 DIAGNOSIS — E559 Vitamin D deficiency, unspecified: Secondary | ICD-10-CM | POA: Diagnosis not present

## 2017-07-14 DIAGNOSIS — E785 Hyperlipidemia, unspecified: Secondary | ICD-10-CM | POA: Diagnosis not present

## 2017-07-14 DIAGNOSIS — I10 Essential (primary) hypertension: Secondary | ICD-10-CM | POA: Diagnosis not present

## 2017-07-14 DIAGNOSIS — E669 Obesity, unspecified: Secondary | ICD-10-CM | POA: Diagnosis not present

## 2017-07-14 DIAGNOSIS — G4700 Insomnia, unspecified: Secondary | ICD-10-CM | POA: Diagnosis not present

## 2017-07-14 DIAGNOSIS — R7302 Impaired glucose tolerance (oral): Secondary | ICD-10-CM | POA: Diagnosis not present

## 2017-07-14 DIAGNOSIS — E039 Hypothyroidism, unspecified: Secondary | ICD-10-CM | POA: Diagnosis not present

## 2017-07-14 DIAGNOSIS — F4310 Post-traumatic stress disorder, unspecified: Secondary | ICD-10-CM | POA: Diagnosis not present

## 2017-07-14 DIAGNOSIS — F418 Other specified anxiety disorders: Secondary | ICD-10-CM | POA: Diagnosis not present

## 2017-07-14 MED FILL — traZODone HCL 50 MG TABS: 50 | 60 days supply | Qty: 30 | Fill #0

## 2017-07-17 MED FILL — LIOTHYRONINE SODIUM 5 MCG T: 5 | 90 days supply | Qty: 90 | Fill #0

## 2017-07-17 MED FILL — LEVOTHYROXINE 25 MCG TABLET: 25 | 90 days supply | Qty: 90 | Fill #0

## 2017-07-19 MED FILL — ATORVASTATIN 40 MG TABLET: 40 | 90 days supply | Qty: 90 | Fill #0

## 2017-07-20 ENCOUNTER — Other Ambulatory Visit: Payer: Self-pay | Admitting: Obstetrics and Gynecology

## 2017-07-20 DIAGNOSIS — Z1231 Encounter for screening mammogram for malignant neoplasm of breast: Secondary | ICD-10-CM

## 2017-07-21 ENCOUNTER — Ambulatory Visit
Admission: RE | Admit: 2017-07-21 | Discharge: 2017-07-21 | Disposition: A | Discharge status: Home / Self Care | Payer: 59 | Source: Ambulatory Visit | Attending: Obstetrics and Gynecology | Admitting: Obstetrics and Gynecology

## 2017-07-21 DIAGNOSIS — Z1231 Encounter for screening mammogram for malignant neoplasm of breast: Secondary | ICD-10-CM | POA: Diagnosis not present

## 2017-07-23 MED FILL — SERTRALINE HCL 100 MG TAB: 100 | 30 days supply | Qty: 90 | Fill #4

## 2017-07-25 ENCOUNTER — Other Ambulatory Visit: Payer: Self-pay | Admitting: Obstetrics and Gynecology

## 2017-07-25 DIAGNOSIS — R928 Other abnormal and inconclusive findings on diagnostic imaging of breast: Secondary | ICD-10-CM

## 2017-07-28 ENCOUNTER — Ambulatory Visit
Admission: RE | Admit: 2017-07-28 | Discharge: 2017-07-28 | Disposition: A | Discharge status: Home / Self Care | Payer: 59 | Source: Ambulatory Visit | Attending: Obstetrics and Gynecology | Admitting: Obstetrics and Gynecology

## 2017-07-28 DIAGNOSIS — N6489 Other specified disorders of breast: Secondary | ICD-10-CM | POA: Diagnosis not present

## 2017-07-28 DIAGNOSIS — R928 Other abnormal and inconclusive findings on diagnostic imaging of breast: Secondary | ICD-10-CM | POA: Diagnosis not present

## 2017-08-01 ENCOUNTER — Other Ambulatory Visit: Payer: 59

## 2017-08-01 MED FILL — PANTOPRAZOLE SOD DR 40 MG T: 40 | 90 days supply | Qty: 90 | Fill #0

## 2017-08-08 ENCOUNTER — Other Ambulatory Visit: Payer: Self-pay | Admitting: Internal Medicine

## 2017-08-08 MED FILL — TRAVATAN Z 0.004% EYE DROP: 0.004 | 30 days supply | Qty: 3 | Fill #3

## 2017-08-08 MED FILL — PHENTERMINE 37.5 MG TABLET: 37.5 | 33 days supply | Qty: 30 | Fill #1

## 2017-08-10 MED FILL — VRAYLAR 1.5 MG CAPSULE: 1.5 | 30 days supply | Qty: 30 | Fill #1

## 2017-08-22 DIAGNOSIS — F4310 Post-traumatic stress disorder, unspecified: Secondary | ICD-10-CM | POA: Diagnosis not present

## 2017-08-22 MED FILL — ZOLPIDEM TARTRATE 10 MG TAB: 10 | 30 days supply | Qty: 30 | Fill #0

## 2017-09-06 DIAGNOSIS — J202 Acute bronchitis due to streptococcus: Secondary | ICD-10-CM | POA: Diagnosis not present

## 2017-09-06 DIAGNOSIS — J0120 Acute ethmoidal sinusitis, unspecified: Secondary | ICD-10-CM | POA: Diagnosis not present

## 2017-09-14 MED FILL — SERTRALINE HCL 100 MG TAB: 100 | 30 days supply | Qty: 90 | Fill #0

## 2017-09-19 MED FILL — ZOLPIDEM TARTRATE 10 MG TAB: 10 | 30 days supply | Qty: 30 | Fill #1

## 2017-09-19 MED FILL — LORazepam 1 MG TABS: 1 | 25 days supply | Qty: 75 | Fill #0

## 2017-09-20 MED FILL — PHENTERMINE 37.5 MG TABLET: 37.5 | 33 days supply | Qty: 30 | Fill #2

## 2017-09-25 MED FILL — MOMETASONE FUROATE 50 MCG S: 50 | 30 days supply | Qty: 17 | Fill #0

## 2017-10-02 MED FILL — BYSTOLIC 10 MG TABLET: 10 | 90 days supply | Qty: 90 | Fill #1

## 2017-10-02 MED FILL — MONTELUKAST SOD 10 MG TAB: 10 | 90 days supply | Qty: 90 | Fill #2

## 2017-10-06 ENCOUNTER — Telehealth: Payer: Self-pay | Admitting: Hematology

## 2017-10-06 NOTE — Telephone Encounter (Signed)
FMLA forms completed and faxed on 10/06/2017 @ 6:38 am.  Called and spoke with Susanne Borders @ 4:29 pm to follow-up and let them know that forms were completed and faxed.

## 2017-10-16 MED FILL — LEVOTHYROXINE 25 MCG TABLET: 25 | 90 days supply | Qty: 90 | Fill #1

## 2017-10-16 MED FILL — SERTRALINE HCL 100 MG TAB: 100 | 30 days supply | Qty: 90 | Fill #1

## 2017-10-16 MED FILL — LIOTHYRONINE SODIUM 5 MCG T: 5 | 90 days supply | Qty: 90 | Fill #1

## 2017-10-18 MED FILL — ZOLPIDEM TARTRATE 10 MG TAB: 10 | 30 days supply | Qty: 30 | Fill #0

## 2017-11-10 ENCOUNTER — Ambulatory Visit: Payer: Self-pay

## 2017-11-10 ENCOUNTER — Other Ambulatory Visit: Payer: Self-pay | Admitting: Occupational Medicine

## 2017-11-10 DIAGNOSIS — M25531 Pain in right wrist: Secondary | ICD-10-CM

## 2017-11-10 DIAGNOSIS — M25532 Pain in left wrist: Secondary | ICD-10-CM

## 2017-11-10 DIAGNOSIS — S20211A Contusion of right front wall of thorax, initial encounter: Secondary | ICD-10-CM

## 2017-11-13 DIAGNOSIS — F4310 Post-traumatic stress disorder, unspecified: Secondary | ICD-10-CM | POA: Diagnosis not present

## 2017-11-14 DIAGNOSIS — M549 Dorsalgia, unspecified: Secondary | ICD-10-CM | POA: Diagnosis not present

## 2017-11-14 DIAGNOSIS — I10 Essential (primary) hypertension: Secondary | ICD-10-CM | POA: Diagnosis not present

## 2017-11-14 DIAGNOSIS — E785 Hyperlipidemia, unspecified: Secondary | ICD-10-CM | POA: Diagnosis not present

## 2017-11-14 DIAGNOSIS — E039 Hypothyroidism, unspecified: Secondary | ICD-10-CM | POA: Diagnosis not present

## 2017-11-14 DIAGNOSIS — E559 Vitamin D deficiency, unspecified: Secondary | ICD-10-CM | POA: Diagnosis not present

## 2017-11-14 DIAGNOSIS — R7309 Other abnormal glucose: Secondary | ICD-10-CM | POA: Diagnosis not present

## 2017-11-14 DIAGNOSIS — F418 Other specified anxiety disorders: Secondary | ICD-10-CM | POA: Diagnosis not present

## 2017-11-15 MED FILL — ZOLPIDEM TARTRATE 10 MG TAB: 10 | 30 days supply | Qty: 30 | Fill #1

## 2017-11-21 MED FILL — PANTOPRAZOLE SOD DR 40 MG T: 40 | 90 days supply | Qty: 90 | Fill #0

## 2017-11-22 MED FILL — SERTRALINE HCL 100 MG TAB: 100 | 30 days supply | Qty: 90 | Fill #0

## 2017-11-24 MED FILL — TRAVATAN Z 0.004% EYE DROP: 0.004 | 30 days supply | Qty: 3 | Fill #0

## 2017-11-24 MED FILL — CELECOXIB 200 MG CAPS: 200 | 30 days supply | Qty: 30 | Fill #0

## 2017-11-24 MED FILL — MOMETASONE FUROATE 50 MCG S: 50 | 30 days supply | Qty: 17 | Fill #1

## 2017-11-27 ENCOUNTER — Ambulatory Visit: Payer: Self-pay

## 2017-11-27 ENCOUNTER — Other Ambulatory Visit: Payer: Self-pay | Admitting: Occupational Medicine

## 2017-11-27 DIAGNOSIS — M545 Low back pain: Secondary | ICD-10-CM

## 2017-12-04 ENCOUNTER — Other Ambulatory Visit (HOSPITAL_COMMUNITY): Payer: Self-pay | Admitting: Orthopedic Surgery

## 2017-12-04 DIAGNOSIS — M545 Low back pain: Secondary | ICD-10-CM

## 2017-12-08 ENCOUNTER — Ambulatory Visit (HOSPITAL_COMMUNITY): Payer: PRIVATE HEALTH INSURANCE

## 2017-12-08 ENCOUNTER — Ambulatory Visit (HOSPITAL_COMMUNITY)
Admission: RE | Admit: 2017-12-08 | Discharge: 2017-12-08 | Disposition: A | Discharge status: Home / Self Care | Payer: PRIVATE HEALTH INSURANCE | Source: Ambulatory Visit | Attending: Orthopedic Surgery | Admitting: Orthopedic Surgery

## 2017-12-09 ENCOUNTER — Ambulatory Visit (HOSPITAL_COMMUNITY)
Admission: RE | Admit: 2017-12-09 | Discharge: 2017-12-09 | Disposition: A | Discharge status: Home / Self Care | Payer: PRIVATE HEALTH INSURANCE | Source: Ambulatory Visit | Attending: Orthopedic Surgery | Admitting: Orthopedic Surgery

## 2017-12-09 DIAGNOSIS — M545 Low back pain: Secondary | ICD-10-CM

## 2017-12-09 DIAGNOSIS — M4802 Spinal stenosis, cervical region: Secondary | ICD-10-CM | POA: Insufficient documentation

## 2017-12-09 DIAGNOSIS — M48061 Spinal stenosis, lumbar region without neurogenic claudication: Secondary | ICD-10-CM | POA: Insufficient documentation

## 2017-12-09 DIAGNOSIS — M5021 Other cervical disc displacement,  high cervical region: Secondary | ICD-10-CM | POA: Insufficient documentation

## 2017-12-13 MED FILL — ZOLPIDEM TARTRATE 10 MG TAB: 10 | 30 days supply | Qty: 30 | Fill #0

## 2017-12-15 MED FILL — CELECOXIB 200 MG CAPSULE: 200 | 30 days supply | Qty: 30 | Fill #0

## 2017-12-15 MED FILL — traMADol HCL 50 MG TABS: 50 | 5 days supply | Qty: 40 | Fill #0

## 2017-12-15 MED FILL — CYCLOBENZAPRINE 5 MG TABLET: 5 | 30 days supply | Qty: 60 | Fill #0

## 2017-12-18 MED FILL — predniSONE 5 MG (48) TBPK: 5 | 12 days supply | Qty: 48 | Fill #0

## 2017-12-18 MED FILL — GABAPENTIN 300 MG CAPSULE: 300 | 30 days supply | Qty: 90 | Fill #0

## 2017-12-20 ENCOUNTER — Ambulatory Visit: Payer: PRIVATE HEALTH INSURANCE

## 2017-12-20 ENCOUNTER — Other Ambulatory Visit: Payer: Self-pay | Admitting: Occupational Medicine

## 2017-12-20 DIAGNOSIS — M25562 Pain in left knee: Secondary | ICD-10-CM

## 2017-12-20 DIAGNOSIS — M25561 Pain in right knee: Secondary | ICD-10-CM

## 2017-12-28 MED FILL — LORazepam 1 MG TABS: 1 | 25 days supply | Qty: 75 | Fill #0

## 2018-01-01 ENCOUNTER — Ambulatory Visit: Payer: PRIVATE HEALTH INSURANCE | Attending: Orthopedic Surgery

## 2018-01-01 ENCOUNTER — Other Ambulatory Visit: Payer: Self-pay

## 2018-01-01 DIAGNOSIS — M542 Cervicalgia: Secondary | ICD-10-CM | POA: Insufficient documentation

## 2018-01-01 DIAGNOSIS — M25561 Pain in right knee: Secondary | ICD-10-CM | POA: Diagnosis present

## 2018-01-01 DIAGNOSIS — M25542 Pain in joints of left hand: Secondary | ICD-10-CM | POA: Insufficient documentation

## 2018-01-01 DIAGNOSIS — M545 Low back pain: Secondary | ICD-10-CM | POA: Insufficient documentation

## 2018-01-01 DIAGNOSIS — M25562 Pain in left knee: Secondary | ICD-10-CM | POA: Insufficient documentation

## 2018-01-01 DIAGNOSIS — M25541 Pain in joints of right hand: Secondary | ICD-10-CM | POA: Insufficient documentation

## 2018-01-01 NOTE — Therapy (Signed)
[place], [place], [address] Phone: [phone]   Fax:  [phone]  Physical Therapy Evaluation  Patient Details  Name: [name] [name] MRN: [id number] Date of Birth: 1956-03-17 Referring Provider: Phylliss Bob, MD   Encounter Date: 01/01/2018  PT End of Session - 01/01/18 1536    Visit Number  1    Number of Visits  8    Date for PT Re-Evaluation  01/26/18    Authorization Type  [facility]    PT Start Time  0310 late    PT Stop Time  0352    PT Time Calculation (min)  42 min    Activity Tolerance  Patient tolerated treatment well;Patient limited by pain;No increased pain    Behavior During Therapy  WFL for tasks assessed/performed       Past Medical History:  Diagnosis Date  . Anxiety   . Bronchiectasis   . Bronchitis    chronic  . 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 COPD (chronic obstructive pulmonary disease) ([place])   . Dental crown present    upper  . Depression   . Environmental allergies    receives allergy shots  . GERD (gastroesophageal reflux disease)   . 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 - no current med.  . Glucose intolerance (impaired glucose tolerance)    on Metformin  . H/O hiatal hernia    "sliding"  . History of echocardiogram    Echo 1/16: EF 60-65, no RWMA, Gr 1 DD, mild LAE, trivial pericardial eff post to heart  . History of echocardiogram    Echo 11/17: EF 55-60, no RWMA, Gr 1 DD, normal RV function.  Marland Kitchen History of palpitations    Holter 12/15: NSR, PACs  . Hx of migraines   . Hypothyroidism   . Osteoarthritis    knees  . Positional headache since 1997   if lies on left side    Past Surgical History:  Procedure Laterality Date  . CHOLECYSTECTOMY  04/21/2011  . CHONDROPLASTY  10/19/2012   Procedure: CHONDROPLASTY;  Surgeon: Yvette Rack., MD;  Location: [place];  Service: Orthopedics;  Laterality: Right;  . COLONOSCOPY     . KNEE ARTHROSCOPY  10/19/2012   Procedure: ARTHROSCOPY KNEE;  Surgeon: Yvette Rack., MD;  Location: [facility];  Service: Orthopedics;  Laterality: Right;  . KNEE ARTHROSCOPY Right 02/01/2013   Procedure: RIGHT KNEE ARTHROSCOPY WITH MEDIAL MENISCECTOMY, DEBRIDEMENT CHONDROMALACIA PATELLA;  Surgeon: Yvette Rack., MD;  Location: [facility];  Service: Orthopedics;  Laterality: Right;  RIGHT KNEE ARTHROSCOPY WITH MEDIAL MENISCECTOMY, DEBRIDEMENT CHONDROMALACIA PATELLA  . KNEE ARTHROSCOPY Left 03/15/2013   Procedure: LEFT KNEE ARTHROSCOPY WITH DEBRIDEMENT/SHAVING CHONDROPLASTY WITH MEDIAL MENISECTOMY;  Surgeon: Yvette Rack., MD;  Location: [facility];  Service: Orthopedics;  Laterality: Left;  . KNEE ARTHROSCOPY WITH LATERAL MENISECTOMY  10/19/2012   Procedure: KNEE ARTHROSCOPY WITH LATERAL MENISECTOMY;  Surgeon: Yvette Rack., MD;  Location: [facility];  Service: Orthopedics;  Laterality: Right;  . TONSILLECTOMY AND ADENOIDECTOMY     age 62  . TUBAL LIGATION  1986  . WISDOM TOOTH EXTRACTION      There were no vitals filed for this visit.   Subjective Assessment - 01/01/18 1514    Subjective  She report stepping into hole in parlking lot at work and feel on hands and  knee.      Limitations  -- Limited at work. bending,     How long can you sit comfortably?  As needed but pain with slouching    How long can you stand comfortably?  10 min    How long can you walk comfortably?  As needed    Diagnostic tests  xray: negative all . MRI neck and lower back: Degenerative changes with stenosis    Patient Stated Goals  She wants to improve to normal activity at work and decr pain.     Currently in Pain?  Yes    Pain Location  Knee    Pain Orientation  Right;Left    Pain Descriptors / Indicators  Burning bruise    Pain Type  Chronic pain    Pain Onset  More than a month ago    Pain Frequency  Constant    Aggravating Factors   weight  bearing    Pain Relieving Factors  rest.  , ice    Multiple Pain Sites  Yes    Pain Score  4    Pain Location  Back    Pain Orientation  Posterior;Mid;Left;Right    Pain Descriptors / Indicators  Aching    Pain Type  -- sub acute    Pain Onset  More than a month ago    Pain Frequency  Constant    Aggravating Factors   holding arms up,  sitting slouched    Pain Relieving Factors  meds. , heat pad    Pain Location  Hand    Pain Orientation  Right;Left    Pain Descriptors / Indicators  Numbness;Shooting    Pain Type  Acute pain    Pain Radiating Towards  both arms    Pain Onset  More than a month ago    Pain Frequency  Intermittent    Aggravating Factors   not sure    Pain Relieving Factors  moveing hands         [facility] PT Assessment - 01/01/18 0001      Assessment   Medical Diagnosis  LBP     Referring Provider  Phylliss Bob, MD    Onset Date/Surgical Date  -- 11/04/17    Prior Therapy  No      Precautions   Precaution Comments  Light duty, limit bend and twisting 10 pounds she is using bracing / ace wraps to knees        Restrictions   Weight Bearing Restrictions  No      Balance Screen   Has the patient fallen in the past 6 months  Yes    How many times?  1 in parking lot at work in hole    Has the patient had a decrease in activity level because of a fear of falling?   Yes    Is the patient reluctant to leave their home because of a fear of falling?   No      Prior Function   Level of Independence  Independent      Observation/Other Assessments   Focus on Therapeutic Outcomes (FOTO)   56% limited      Posture/Postural Control   Posture Comments  cervical ext , forwrd head , rounded shoulders       ROM / Strength   AROM / PROM / Strength  AROM;Strength      AROM   AROM Assessment Site  Lumbar;Cervical    Cervical Flexion  45  Cervical Extension  40    Cervical - Right Side Bend  30    Cervical - Left Side Bend  25    Cervical - Right Rotation  60     Cervical - Left Rotation  55    Lumbar Flexion  40    Lumbar Extension  20    Lumbar - Right Side Bend  15    Lumbar - Left Side Bend  15      Strength   Overall Strength Comments  UE WNL, hips 4/5 and quads 4/5 mostly due to pain. Poor abdominals       Flexibility   Soft Tissue Assessment /Muscle Length  yes    Hamstrings  WNL      Ambulation/Gait   Gait Comments  WNL             Objective measurements completed on examination: See above findings.                PT Short Term Goals - 01/01/18 1607      PT SHORT TERM GOAL #1   Title  She will be independent with initial HEP    Time  2    Period  Weeks    Status  New      PT SHORT TERM GOAL #2   Title  She will report understanign of proper posture and able to demo same.     Time  2    Period  Weeks    Status  New        PT Long Term Goals - 01/01/18 1608      PT LONG TERM GOAL #1   Title  She will be independent with all HEP issued    Time  4    Period  Weeks    Status  New      PT LONG TERM GOAL #2   Title  She will report pain in lower and upper back decreased 75% or more.     Time  4    Period  Weeks    Status  New      PT LONG TERM GOAL #3   Title  She will report UE symptoms decreased  75% or more allowing her to return to normal work taks    Time  4    Period  Weeks    Status  New      PT LONG TERM GOAL #4   Title  She will report knee pain decreased 30% or more.     Time  4    Period  Weeks    Status  New      PT LONG TERM GOAL #5   Title  FOTO score improved to 40% limited or less to demo functional improvement    Time  4    Period  Weeks    Status  New             Plan - 01/01/18 1552    Clinical Impression Statement  MS Romberg presents post fall on hands and knees in parking lot at work. she has myltiple areas of complaint and is limtied to light duty at work. Her scans indicate significnat degenerative changes  some may have been prior to fall    History  and Personal Factors relevant to plan of care:  multiple knee surgeries, obesity, probable on going degenerative changes in spine    Clinical Presentation  Evolving    Clinical Presentation  due to:  multiple areas of pain from fall    Clinical Decision Making  Moderate    Rehab Potential  Good    PT Frequency  2x / week    PT Duration  4 weeks    PT Treatment/Interventions  Cryotherapy;Electrical Stimulation;Iontophoresis 4mg /ml Dexamethasone;Moist Heat;Traction;Therapeutic exercise;Manual techniques;Taping;Dry needling;Passive range of motion;Patient/family education;Therapeutic activities    PT Next Visit Plan  REview neck exer and wrist stretch , use modalities , Add to HEP.  manual    PT Home Exercise Plan  cervical rot and sidebend with chin tuck 4 fingers off chest, wrist prayer stretch    Consulted and Agree with Plan of Care  Patient       Patient will benefit from skilled therapeutic intervention in order to improve the following deficits and impairments:  Pain, Increased muscle spasms, Postural dysfunction, Decreased range of motion, Decreased activity tolerance, Decreased strength, Impaired UE functional use  Visit Diagnosis: Cervicalgia  Bilateral low back pain, unspecified chronicity, with sciatica presence unspecified  Right knee pain, unspecified chronicity  Left knee pain, unspecified chronicity  Pain in joints of right hand  Pain in joints of left hand     Problem List Patient Active Problem List   Diagnosis Date Noted  . Adrenal disorder ([place]) 01/29/2015  . Palpable abdominal aorta 01/01/2015  . Heart palpitations 01/01/2015  . Hypothyroidism 01/01/2015  . Seasonal and perennial allergic rhinitis 07/02/2012  . Dyspnea 05/25/2012  . GERD (gastroesophageal reflux disease) 05/16/2012  . Chronic cough 05/16/2012  . Asthma with bronchitis 05/16/2012    Darrel Hoover  PT 01/01/2018, 4:17 PM  [facility] [address] [place], [place], [address] Phone: [phone]   Fax:  [phone]  Name: [name] [name] MRN: [id number] Date of Birth: [birth year]

## 2018-01-09 ENCOUNTER — Ambulatory Visit (HOSPITAL_COMMUNITY)
Admission: EM | Admit: 2018-01-09 | Discharge: 2018-01-09 | Disposition: A | Discharge status: Home / Self Care | Payer: 59 | Attending: Family Medicine | Admitting: Family Medicine

## 2018-01-09 ENCOUNTER — Encounter: Payer: Self-pay | Admitting: Physical Therapy

## 2018-01-09 ENCOUNTER — Encounter (HOSPITAL_COMMUNITY): Payer: Self-pay | Admitting: Emergency Medicine

## 2018-01-09 DIAGNOSIS — J019 Acute sinusitis, unspecified: Secondary | ICD-10-CM | POA: Diagnosis not present

## 2018-01-09 MED ORDER — AMOXICILLIN-POT CLAVULANATE 875-125 MG PO TABS: 1.0000 | ORAL_TABLET | Freq: Two times a day (BID) | ORAL | 0 refills | Status: AC

## 2018-01-09 MED FILL — SERTRALINE HCL 100 MG TAB: 100 | 30 days supply | Qty: 90 | Fill #1

## 2018-01-09 NOTE — ED Triage Notes (Signed)
PT reports sinusitis symptoms that started 3 days ago.

## 2018-01-09 NOTE — Discharge Instructions (Signed)
Complete course of antibiotics. Continue with daily nasonex and allegra as already taking.  Tylenol and/or ibuprofen as needed for pain or fevers.   Push fluids to ensure adequate hydration and keep secretions thin.   Neti pot or nasal saline throughout the day to promote drainage. If symptoms worsen or do not improve in the next week to return to be seen or to follow up with your PCP.

## 2018-01-09 NOTE — ED Provider Notes (Signed)
[place]    CSN: [id number] Arrival date & time: 01/09/18  1519     History   Chief Complaint Chief Complaint  Patient presents with  . Facial Pain    HPI [name] [name] is a 62 y.o. female.   [name] [name] presents with complaints of facial pressure, sore throat, swollen lymph nodes, slight cough which started three days ago. She took ibuprofen this morning which did help, as well as mucinex and tylenol. Uses nasonex at bedtime as well as allegra daily. States she tends to get frequent sinus infections. History of allergies and asthma. Uses a neti pot. No known fevers. No known ill contacts. Without gi/gu complaints. Patient very concerned as her husband is receiving cancer treatment.   ROS per HPI.       Past Medical History:  Diagnosis Date  . Anxiety   . Bronchiectasis   . Bronchitis    chronic  . 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 COPD (chronic obstructive pulmonary disease) ([place])   . Dental crown present    upper  . Depression   . Environmental allergies    receives allergy shots  . GERD (gastroesophageal reflux disease)   . 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 - no current med.  . Glucose intolerance (impaired glucose tolerance)    on Metformin  . H/O hiatal hernia    "sliding"  . History of echocardiogram    Echo 1/16: EF 60-65, no RWMA, Gr 1 DD, mild LAE, trivial pericardial eff post to heart  . History of echocardiogram    Echo 11/17: EF 55-60, no RWMA, Gr 1 DD, normal RV function.  Marland Kitchen History of palpitations    Holter 12/15: NSR, PACs  . Hx of migraines   . Hypothyroidism   . Osteoarthritis    knees  . Positional headache since 1997   if lies on left side    Patient Active Problem List   Diagnosis Date Noted  . Adrenal disorder ([place]) 01/29/2015  . Palpable abdominal aorta 01/01/2015  . Heart palpitations 01/01/2015  . Hypothyroidism 01/01/2015  . Seasonal and perennial allergic rhinitis 07/02/2012  . Dyspnea  05/25/2012  . GERD (gastroesophageal reflux disease) 05/16/2012  . Chronic cough 05/16/2012  . Asthma with bronchitis 05/16/2012    Past Surgical History:  Procedure Laterality Date  . CHOLECYSTECTOMY  04/21/2011  . CHONDROPLASTY  10/19/2012   Procedure: CHONDROPLASTY;  Surgeon: Yvette Rack., MD;  Location: [facility];  Service: Orthopedics;  Laterality: Right;  . COLONOSCOPY    . KNEE ARTHROSCOPY  10/19/2012   Procedure: ARTHROSCOPY KNEE;  Surgeon: Yvette Rack., MD;  Location: [facility];  Service: Orthopedics;  Laterality: Right;  . KNEE ARTHROSCOPY Right 02/01/2013   Procedure: RIGHT KNEE ARTHROSCOPY WITH MEDIAL MENISCECTOMY, DEBRIDEMENT CHONDROMALACIA PATELLA;  Surgeon: Yvette Rack., MD;  Location: [facility];  Service: Orthopedics;  Laterality: Right;  RIGHT KNEE ARTHROSCOPY WITH MEDIAL MENISCECTOMY, DEBRIDEMENT CHONDROMALACIA PATELLA  . KNEE ARTHROSCOPY Left 03/15/2013   Procedure: LEFT KNEE ARTHROSCOPY WITH DEBRIDEMENT/SHAVING CHONDROPLASTY WITH MEDIAL MENISECTOMY;  Surgeon: Yvette Rack., MD;  Location: [facility];  Service: Orthopedics;  Laterality: Left;  . KNEE ARTHROSCOPY WITH LATERAL MENISECTOMY  10/19/2012   Procedure: KNEE ARTHROSCOPY WITH LATERAL MENISECTOMY;  Surgeon: Yvette Rack., MD;  Location: [facility];  Service: Orthopedics;  Laterality: Right;  . TONSILLECTOMY AND ADENOIDECTOMY  age 63  . [place]  . WISDOM TOOTH EXTRACTION      OB History    Gravida Para Term Preterm AB Living   4 4 4     4    SAB TAB Ectopic Multiple Live Births                   Home Medications    Prior to Admission medications   Medication Sig Start Date End Date Taking? Authorizing Provider  albuterol (PROAIR HFA) 108 (90 BASE) MCG/ACT inhaler 2 puffs every 4 hours as needed only  if your can't catch your breath 12/15/14   Tanda Rockers, MD  albuterol (PROVENTIL) (2.5 MG/3ML) 0.083%  nebulizer solution Take 3 mLs (2.5 mg total) by nebulization every 6 (six) hours as needed for wheezing or shortness of breath. 02/20/14   Baird Lyons D, MD  albuterol (VENTOLIN HFA) 108 (90 BASE) MCG/ACT inhaler Inhale 2 puffs into the lungs every 6 (six) hours as needed for wheezing or shortness of breath.     [provider]  ALPRAZolam Duanne Moron) 0.25 MG tablet Take 0.125-0.25 mg by mouth daily. As needed anxiety    [provider]  amoxicillin-clavulanate (AUGMENTIN) 875-125 MG tablet Take 1 tablet by mouth every 12 (twelve) hours for 10 days. 01/09/18 01/19/18  Zigmund Gottron, NP  azelastine (ASTELIN) 0.1 % nasal spray Place into both nostrils 2 (two) times daily. Use in each nostril as directed    [provider]  B Complex Vitamins (B-COMPLEX/B-12) LIQD Place 5 mLs under the tongue daily at 2 PM daily at 2 PM.    [provider]  Cholecalciferol (VITAMIN D) 2000 UNITS tablet Take 5,000 Units by mouth daily.     [provider]  fluticasone (FLONASE) 50 MCG/ACT nasal spray Place 2 sprays into both nostrils daily. 04/27/17 05/07/17  Kara Dies, NP  furosemide (LASIX) 20 MG tablet Take 20 mg by mouth daily. 09/28/16   [provider]  Iodine, [place], TABS Take 1 tablet by mouth daily.    [provider]  ipratropium (ATROVENT) 0.02 % nebulizer solution Take 2.5 mLs (0.5 mg total) by nebulization every 6 (six) hours as needed for wheezing or shortness of breath. 02/20/14   Baird Lyons D, MD  ipratropium (ATROVENT) 0.03 % nasal spray Place 2 sprays into the nose every 12 (twelve) hours as needed for rhinitis.  09/24/13   Baird Lyons D, MD  ipratropium-albuterol (DUONEB) 0.5-2.5 (3) MG/3ML SOLN Take 3 mLs by nebulization every 6 (six) hours as needed (wheezinf or shortness of breath (sob)).    [provider]  levothyroxine (SYNTHROID, LEVOTHROID) 25 MCG tablet Take 25 mcg by mouth daily before breakfast.    [provider]  liothyronine (CYTOMEL) 5 MCG tablet Take 5 mcg by mouth daily.    [provider]  magnesium oxide (MAG-OX) 400 MG tablet Take 800 mg by mouth daily.    [provider]  mometasone (NASONEX) 50 MCG/ACT nasal spray PLACE 2 SPRAYS INTO THE NOSE DAILY. 06/09/16   Baird Lyons D, MD  montelukast (SINGULAIR) 10 MG tablet TAKE 1 TABLET BY MOUTH ONCE DAILY 05/04/16   Baird Lyons D, MD  Multiple Vitamins-Minerals (MULTIVITAMIN ADULT PO) Take 1 tablet by mouth daily.    [provider]  nebivolol (BYSTOLIC) 10 MG tablet Take 1 tablet (10 mg total) by mouth daily. 12/15/14   Tanda Rockers, MD  Nebulizers (NEBULIZER COMPRESSOR) MISC Use as  directed 12/15/14   Tanda Rockers, MD  NONFORMULARY OR COMPOUNDED ITEM Allergy Vaccine 1:50000 build up (01/19/15) Given at [facility]    [provider]  OLANZapine (ZYPREXA) 20 MG tablet Take 20 mg by mouth at bedtime.    [provider]  pantoprazole (PROTONIX) 40 MG tablet Take 1 tablet (40 mg total) by mouth 2 (two) times daily before a meal. 12/15/14   Tanda Rockers, MD  PARoxetine HCl (PAXIL PO) Take 80 mg by mouth daily.    [provider]  Probiotic Product (PROBIOTIC DAILY) CAPS Take 1 capsule by mouth daily.    [provider]  Travoprost, BAK Free, (TRAVATAN) 0.004 % SOLN ophthalmic solution Place 1 drop into both eyes at bedtime.    [provider]  vitamin A 10000 UNIT capsule Take 10,000 Units by mouth daily.    [provider]    Family History Family History  Problem Relation Age of Onset  . Lung cancer Father 71       dies age 35  . Emphysema Father   . COPD Brother 36       died age 36  . Stroke Mother   . Hypertension Sister   . Thyroid disease Sister        Graves Disease    Social History Social History   Tobacco Use  . Smoking status: Never Smoker  . Smokeless tobacco: Never Used  Substance Use Topics  . Alcohol use: Yes     Alcohol/week: 1.0 oz    Types: 2 drink(s) per week    Comment: occasionally  . Drug use: No     Allergies   Levaquin [levofloxacin] and Nickel   Review of Systems Review of Systems   Physical Exam Triage Vital Signs ED Triage Vitals  Enc Vitals Group     BP 01/09/18 1607 (!) 147/89     Pulse Rate 01/09/18 1607 81     Resp 01/09/18 1607 18     Temp 01/09/18 1607 99 F (37.2 C)     Temp Source 01/09/18 1607 Oral     SpO2 01/09/18 1607 96 %     Weight 01/09/18 1610 194 lb (88 kg)     Height --      Head Circumference --      Peak Flow --      Pain Score --      Pain Loc --      Pain Edu? --      Excl. in [place]? --    No data found.  Updated Vital Signs BP (!) 147/89 (BP Location: Left Arm) Comment: Notified [place]  Pulse 81   Temp 99 F (37.2 C) (Oral)   Resp 18   Wt 194 lb (88 kg)   LMP 12/12/2005   SpO2 96%   BMI 36.66 kg/m   Visual Acuity Right Eye Distance:   Left Eye Distance:   Bilateral Distance:    Right Eye Near:   Left Eye Near:    Bilateral Near:     Physical Exam  Constitutional: She is oriented to person, place, and time. She appears well-developed and well-nourished. No distress.  HENT:  Head: Normocephalic and atraumatic.  Right Ear: Tympanic membrane, external ear and ear canal normal.  Left Ear: Tympanic membrane, external ear and ear canal normal.  Nose: Rhinorrhea present. Right sinus exhibits frontal sinus tenderness. Left sinus exhibits frontal sinus tenderness.  Mouth/Throat: Uvula is midline, oropharynx is clear and moist  and mucous membranes are normal. No tonsillar exudate.  Eyes: Conjunctivae and EOM are normal. Pupils are equal, round, and reactive to light.  Cardiovascular: Normal rate, regular rhythm and normal heart sounds.  Pulmonary/Chest: Effort normal and breath sounds normal.  Neurological: She is alert and oriented to person, place, and time.  Skin: Skin is warm and dry.     UC Treatments / Results  Labs (all  labs ordered are listed, but only abnormal results are displayed) Labs Reviewed - No data to display  EKG  EKG Interpretation None       Radiology No results found.  Procedures Procedures (including critical care time)  Medications Ordered in UC Medications - No data to display   Initial Impression / Assessment and Plan / UC Course  I have reviewed the triage vital signs and the nursing notes.  Pertinent labs & imaging results that were available during my care of the patient were reviewed by me and considered in my medical decision making (see chart for details).     Discussed with patient that this is likely viral sinusitis, at length. Patient tearful and upset. augmentin prescribed at this time, recommended patient start taking at least in another 4 days if symptoms persist. Continue with mucinex, ibuprofen, tylenol. Medications as previously prescribed. Push fluids. If symptoms worsen or do not improve in the next week to return to be seen or to follow up with PCP.  Patient verbalized understanding and agreeable to plan.    Final Clinical Impressions(s) / UC Diagnoses   Final diagnoses:  Acute sinusitis, recurrence not specified, unspecified location    ED Discharge Orders        Ordered    amoxicillin-clavulanate (AUGMENTIN) 875-125 MG tablet  Every 12 hours     01/09/18 1655       Controlled Substance Prescriptions [place] Controlled Substance Registry consulted? Not Applicable   Zigmund Gottron, NP 01/09/18 [phone]

## 2018-01-10 ENCOUNTER — Encounter: Payer: Self-pay | Admitting: Physical Therapy

## 2018-01-15 ENCOUNTER — Ambulatory Visit: Payer: 59

## 2018-01-15 ENCOUNTER — Telehealth: Payer: Self-pay | Admitting: Internal Medicine

## 2018-01-15 MED FILL — CYCLOBENZAPRINE 5 MG TABLET: 5 | 30 days supply | Qty: 60 | Fill #1

## 2018-01-15 MED FILL — MONTELUKAST SOD 10 MG TAB: 10 | 90 days supply | Qty: 90 | Fill #3

## 2018-01-15 MED FILL — MOMETASONE FUROATE 50 MCG S: 50 | 30 days supply | Qty: 17 | Fill #2

## 2018-01-15 NOTE — Telephone Encounter (Signed)
States went to [facility] and was given augmentin for severe sinus infection but states needs other medication as not helping.

## 2018-01-15 NOTE — Telephone Encounter (Signed)
[name] [name]- I'm happy to see her again. Any time to work her in next 7-10 days?

## 2018-01-15 NOTE — Telephone Encounter (Signed)
Left message for patient to call back  

## 2018-01-15 NOTE — Telephone Encounter (Signed)
Patient returned call.  She was scheduled for 01/24/2018 at 11:30 am per Katie's note.  Patient was advised to check in at 11:15 am.  No call back is needed.

## 2018-01-15 NOTE — Telephone Encounter (Signed)
Noted. Patient is scheduled for appointment. Nothing further needed. Will close encounter.

## 2018-01-15 NOTE — Telephone Encounter (Signed)
Please have patient come in on Wednesday 01-24-18 at 11:30am. Okay to double book. Thanks.

## 2018-01-15 NOTE — Telephone Encounter (Signed)
Spoke with patient. She stated that she was unaware that it had been so long since she had last CY. She had stopped coming in due to her feeling well.   She has a history of chronic sinusitis. The past episode has been going on since October 2018. She has constant sinus headaches and facial pressure. She has been taking Allegra, Mucinex and using saline rinses with no help. She was seen by urgent care and was prescribed Augmentin 875 on 1/29.   She wants to know if there is anyway possible you would be willing to take her back as a patient and see her sometime this week?   CY, please advise. Thanks!

## 2018-01-16 MED FILL — TRAVATAN Z 0.004% EYE DROP: 0.004 | 30 days supply | Qty: 3 | Fill #1

## 2018-01-16 MED FILL — ZOLPIDEM TARTRATE 10 MG TAB: 10 | 30 days supply | Qty: 30 | Fill #0

## 2018-01-16 MED FILL — BYSTOLIC 10 MG TABLET: 10 | 90 days supply | Qty: 90 | Fill #2

## 2018-01-17 DIAGNOSIS — J321 Chronic frontal sinusitis: Secondary | ICD-10-CM | POA: Diagnosis not present

## 2018-01-17 DIAGNOSIS — J4520 Mild intermittent asthma, uncomplicated: Secondary | ICD-10-CM | POA: Diagnosis not present

## 2018-01-17 MED FILL — traMADol HCL 50 MG TABS: 50 | 5 days supply | Qty: 40 | Fill #1

## 2018-01-19 ENCOUNTER — Ambulatory Visit: Payer: PRIVATE HEALTH INSURANCE | Attending: Orthopedic Surgery | Admitting: Physical Therapy

## 2018-01-19 ENCOUNTER — Encounter: Payer: Self-pay | Admitting: Physical Therapy

## 2018-01-19 DIAGNOSIS — M545 Low back pain: Secondary | ICD-10-CM | POA: Insufficient documentation

## 2018-01-19 DIAGNOSIS — M542 Cervicalgia: Secondary | ICD-10-CM | POA: Insufficient documentation

## 2018-01-19 MED FILL — VENTOLIN HFA 90 MCG INHALER: 108 (90 BAS | 25 days supply | Qty: 18 | Fill #0

## 2018-01-19 NOTE — Therapy (Addendum)
[place], [place], [address] Phone: [phone]   Fax:  [phone]  Physical Therapy Treatment Addended Discharge   Patient Details  Name: [name] [name] MRN: [id number] Date of Birth: 12-05-1956 Referring Provider: Phylliss Bob, MD   Encounter Date: 01/19/2018  PT End of Session - 01/19/18 0824    Visit Number  2    Number of Visits  8    Date for PT Re-Evaluation  01/26/18    Authorization Type  [facility]    PT Start Time  0800    PT Stop Time  0847    PT Time Calculation (min)  47 min    Activity Tolerance  Patient tolerated treatment well    Behavior During Therapy  [facility] for tasks assessed/performed       Past Medical History:  Diagnosis Date  . Anxiety   . Bronchiectasis   . Bronchitis    chronic  . 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 COPD (chronic obstructive pulmonary disease) ([place])   . Dental crown present    upper  . Depression   . Environmental allergies    receives allergy shots  . GERD (gastroesophageal reflux disease)   . 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 - no current med.  . Glucose intolerance (impaired glucose tolerance)    on Metformin  . H/O hiatal hernia    "sliding"  . History of echocardiogram    Echo 1/16: EF 60-65, no RWMA, Gr 1 DD, mild LAE, trivial pericardial eff post to heart  . History of echocardiogram    Echo 11/17: EF 55-60, no RWMA, Gr 1 DD, normal RV function.  Marland Kitchen History of palpitations    Holter 12/15: NSR, PACs  . Hx of migraines   . Hypothyroidism   . Osteoarthritis    knees  . Positional headache since 1997   if lies on left side    Past Surgical History:  Procedure Laterality Date  . CHOLECYSTECTOMY  04/21/2011  . CHONDROPLASTY  10/19/2012   Procedure: CHONDROPLASTY;  Surgeon: Yvette Rack., MD;  Location: [place];  Service: Orthopedics;  Laterality: Right;  . COLONOSCOPY    . KNEE ARTHROSCOPY   10/19/2012   Procedure: ARTHROSCOPY KNEE;  Surgeon: Yvette Rack., MD;  Location: [place];  Service: Orthopedics;  Laterality: Right;  . KNEE ARTHROSCOPY Right 02/01/2013   Procedure: RIGHT KNEE ARTHROSCOPY WITH MEDIAL MENISCECTOMY, DEBRIDEMENT CHONDROMALACIA PATELLA;  Surgeon: Yvette Rack., MD;  Location: [place];  Service: Orthopedics;  Laterality: Right;  RIGHT KNEE ARTHROSCOPY WITH MEDIAL MENISCECTOMY, DEBRIDEMENT CHONDROMALACIA PATELLA  . KNEE ARTHROSCOPY Left 03/15/2013   Procedure: LEFT KNEE ARTHROSCOPY WITH DEBRIDEMENT/SHAVING CHONDROPLASTY WITH MEDIAL MENISECTOMY;  Surgeon: Yvette Rack., MD;  Location: [place];  Service: Orthopedics;  Laterality: Left;  . KNEE ARTHROSCOPY WITH LATERAL MENISECTOMY  10/19/2012   Procedure: KNEE ARTHROSCOPY WITH LATERAL MENISECTOMY;  Surgeon: Yvette Rack., MD;  Location: [place];  Service: Orthopedics;  Laterality: Right;  . TONSILLECTOMY AND ADENOIDECTOMY     age 62  . TUBAL LIGATION  1986  . WISDOM TOOTH EXTRACTION      There were no vitals filed for this visit.  Subjective Assessment - 01/19/18 0805    Subjective  The neck stretches are good for me.  Wears a knee brace today.      Pertinent  History  3 arthroscopic surgeries knees     Currently in Pain?  Yes    Pain Score  2     Pain Location  Neck    Pain Orientation  Left    Pain Descriptors / Indicators  Tightness    Pain Type  Chronic pain    Pain Score  2    Pain Location  Back    Pain Orientation  Lower    Pain Descriptors / Indicators  Tightness    Pain Type  Chronic pain    Pain Onset  More than a month ago    Pain Frequency  Intermittent         [facility] Adult PT Treatment/Exercise - 01/19/18 0001      Neck Exercises: Stabilization   Stabilization  chin tuck, retraction in supine x 10 anfd demo in sitting       Lumbar Exercises: Stretches   Single Knee to Chest Stretch  3 reps    Lower Trunk Rotation   10 seconds    Lower Trunk Rotation Limitations  x 10 , head turns     Pelvic Tilt  10 reps      Lumbar Exercises: Standing   Scapular Retraction  Strengthening;Both    Scapular Retraction Limitations  stabilization ex against wall with red band: overhead narrow grip and horizontal pull x 10     Other Standing Lumbar Exercises  corner stretch x 3 , 30 sec     Other Standing Lumbar Exercises  sink stretch x 3, 20 sec       Lumbar Exercises: Supine   Clam  10 reps    Bent Knee Raise  10 reps    Bridge  10 reps      Wrist Exercises   Other wrist exercises  prayer stretch with chin tuck x 3              PT Education - 01/19/18 0921    Education provided  Yes    Education Details  core, HEP, posture, stretching    Person(s) Educated  Patient    Methods  Explanation;Handout    Comprehension  Verbalized understanding;Returned demonstration       PT Short Term Goals - 01/19/18 0824      PT SHORT TERM GOAL #1   Title  She will be independent with initial HEP    Baseline  met for cervical ex    Status  On-going      PT SHORT TERM GOAL #2   Title  She will report understanding of proper posture and able to demo same.     Status  On-going        PT Long Term Goals - 01/01/18 1608      PT LONG TERM GOAL #1   Title  She will be independent with all HEP issued    Time  4    Period  Weeks    Status  New      PT LONG TERM GOAL #2   Title  She will report pain in lower and upper back decreased 75% or more.     Time  4    Period  Weeks    Status  New      PT LONG TERM GOAL #3   Title  She will report UE symptoms decreased  75% or more allowing her to return to normal work taks    Time  4    Period  Weeks  Status  New      PT LONG TERM GOAL #4   Title  --    Time  --    Period  --    Status  --      PT LONG TERM GOAL #5   Title  FOTO score improved to 40% limited or less to demo functional improvement    Time  4    Period  Weeks    Status  New             Plan - 01/19/18 5465    Clinical Impression Statement  Pt with less pain overall, she has a stiffness in neck and back.  She was able to lie in supine more comfortably today than eval.  Began stabilization and gave HEP for trunk flexibility.  No pain post.       PT Treatment/Interventions  Cryotherapy;Electrical Stimulation;Iontophoresis 5m/ml Dexamethasone;Moist Heat;Traction;Therapeutic exercise;Manual techniques;Taping;Dry needling;Passive range of motion;Patient/family education;Therapeutic activities    PT Next Visit Plan  stretching, core stab, posture,  manual           NO TREATMENT TO KNEES. THEY ARE NOT PART OF THIS WC EPISODE OF CARE    PT Home Exercise Plan  cervical rot and sidebend with chin tuck 4 fingers off chest, wrist prayer stretch    Consulted and Agree with Plan of Care  Patient       Patient will benefit from skilled therapeutic intervention in order to improve the following deficits and impairments:  Pain, Increased muscle spasms, Postural dysfunction, Decreased range of motion, Decreased activity tolerance, Decreased strength, Impaired UE functional use  Visit Diagnosis: Cervicalgia  Bilateral low back pain, unspecified chronicity, with sciatica presence unspecified     Problem List Patient Active Problem List   Diagnosis Date Noted  . Adrenal disorder ([place] 01/29/2015  . Palpable abdominal aorta 01/01/2015  . Heart palpitations 01/01/2015  . Hypothyroidism 01/01/2015  . Seasonal and perennial allergic rhinitis 07/02/2012  . Dyspnea 05/25/2012  . GERD (gastroesophageal reflux disease) 05/16/2012  . Chronic cough 05/16/2012  . Asthma with bronchitis 05/16/2012    Johnattan Strassman 01/19/2018, 9:22 AM  [place] [address]Phone: [phone]  Fax:  [phone] Name: [name] KRONBERGMRN: [id number] of Birth: 1September 04, 1957  JRaeford Razor PT 01/19/18  9:22 AM Phone: [phone]: [phone] PHYSICAL THERAPY DISCHARGE SUMMARY  Visits from Start of Care: 2  Current functional level related to goals / functional outcomes: See above    Remaining deficits: Unknown, has not returned  Education / Equipment: HEP, posture lifting  Plan: Patient agrees to discharge.  Patient goals were not met. Patient is being discharged due to not returning since the last visit.  ?????    JRaeford Razor PT 03/19/18 2:53 PM Phone: [phone]: [phone]

## 2018-01-22 ENCOUNTER — Ambulatory Visit: Payer: 59 | Admitting: Physical Therapy

## 2018-01-24 ENCOUNTER — Encounter: Payer: Self-pay | Admitting: Physical Therapy

## 2018-01-24 ENCOUNTER — Ambulatory Visit (INDEPENDENT_AMBULATORY_CARE_PROVIDER_SITE_OTHER)
Admission: RE | Admit: 2018-01-24 | Discharge: 2018-01-24 | Disposition: A | Discharge status: Home / Self Care | Payer: 59 | Source: Ambulatory Visit | Attending: Internal Medicine | Admitting: Internal Medicine

## 2018-01-24 ENCOUNTER — Ambulatory Visit: Payer: 59 | Admitting: Internal Medicine

## 2018-01-24 ENCOUNTER — Other Ambulatory Visit (INDEPENDENT_AMBULATORY_CARE_PROVIDER_SITE_OTHER): Payer: 59

## 2018-01-24 ENCOUNTER — Encounter: Payer: Self-pay | Admitting: Internal Medicine

## 2018-01-24 VITALS — BP 134/80 | HR 98 | Ht 61.0 in | Wt 219.0 lb

## 2018-01-24 DIAGNOSIS — R6889 Other general symptoms and signs: Secondary | ICD-10-CM | POA: Diagnosis not present

## 2018-01-24 DIAGNOSIS — R0600 Dyspnea, unspecified: Secondary | ICD-10-CM

## 2018-01-24 DIAGNOSIS — R0609 Other forms of dyspnea: Secondary | ICD-10-CM

## 2018-01-24 DIAGNOSIS — J479 Bronchiectasis, uncomplicated: Secondary | ICD-10-CM

## 2018-01-24 DIAGNOSIS — J45909 Unspecified asthma, uncomplicated: Secondary | ICD-10-CM | POA: Diagnosis not present

## 2018-01-24 DIAGNOSIS — R05 Cough: Secondary | ICD-10-CM | POA: Diagnosis not present

## 2018-01-24 DIAGNOSIS — R0602 Shortness of breath: Secondary | ICD-10-CM | POA: Diagnosis not present

## 2018-01-24 LAB — CBC WITH DIFFERENTIAL/PLATELET
Basophils Absolute: 0.1 10*3/uL (ref 0.0–0.1)
Basophils Relative: 0.5 % (ref 0.0–3.0)
Eosinophils Absolute: 0.1 10*3/uL (ref 0.0–0.7)
Eosinophils Relative: 0.6 % (ref 0.0–5.0)
HCT: 43.2 % (ref 36.0–46.0)
Hemoglobin: 14.4 g/dL (ref 12.0–15.0)
Lymphocytes Relative: 28.5 % (ref 12.0–46.0)
Lymphs Abs: 4.1 10*3/uL — ABNORMAL HIGH (ref 0.7–4.0)
MCHC: 33.4 g/dL (ref 30.0–36.0)
MCV: 92.8 fl (ref 78.0–100.0)
Monocytes Absolute: 0.8 10*3/uL (ref 0.1–1.0)
Monocytes Relative: 5.6 % (ref 3.0–12.0)
Neutro Abs: 9.3 10*3/uL — ABNORMAL HIGH (ref 1.4–7.7)
Neutrophils Relative %: 64.8 % (ref 43.0–77.0)
Platelets: 517 10*3/uL — ABNORMAL HIGH (ref 150.0–400.0)
RBC: 4.65 Mil/uL (ref 3.87–5.11)
RDW: 13.6 % (ref 11.5–15.5)
WBC: 14.4 10*3/uL — ABNORMAL HIGH (ref 4.0–10.5)

## 2018-01-24 LAB — BASIC METABOLIC PANEL
BUN: 15 mg/dL (ref 6–23)
CO2: 29 mEq/L (ref 19–32)
Calcium: 9.1 mg/dL (ref 8.4–10.5)
Chloride: 100 mEq/L (ref 96–112)
Creatinine, Ser: 0.97 mg/dL (ref 0.40–1.20)
GFR: 61.78 mL/min (ref >60.00)
Glucose, Bld: 101 mg/dL — ABNORMAL HIGH (ref 70–99)
Potassium: 3.7 mEq/L (ref 3.5–5.1)
Sodium: 138 mEq/L (ref 135–145)

## 2018-01-24 LAB — POCT INFLUENZA A/B

## 2018-01-24 LAB — BRAIN NATRIURETIC PEPTIDE: Pro B Natriuretic peptide (BNP): 23 pg/mL (ref 0.0–100.0)

## 2018-01-24 NOTE — Progress Notes (Signed)
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 Allergy skin test- 01/14/15-positive for mixed grass pollens, fall weeds, several tree pollens, dust mite, feather, cat and some molds.  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 She has been followed here by Dr Chase Caller for Cough/ obstructive lung disease/ chronic bronchitis, complicated by GERD. Has been getting allergy vaccine here based on original testing by Dr Jinny Blossom. She describes difficult home situation with disabled husband. She is returning to work at [employer]. Her PCP requests we draw ANA and RF while here. Normal CBC and immunoglobulins 2013.  Allergy skin test- 01/14/15-positive for mixed grass pollens, fall weeds, several tree pollens, dust mite, feather, cat and some molds. We compared this to the contents of her current allergy vaccine started in 2011. After discussion, she wants to remix and restart here based on current testing.  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 Acute onset 2 weeks ago sore  throat, headache.  Urgent care treated with Augmentin.  Then chest began congestion.  No myalgias.  Some cold sweats.  Last week treated at [facility] with Biaxin, prednisone 10 mg daily for 7 days.  Spiriva 1.25 mg.  Now complains breathless.  Using nebulizer.  Dry hacking cough without wheeze.  Some GERD.  Much yellow nasal discharge-using Nettie pot.  No headache.  Had recently fallen in parking lot and was given prednisone taper by orthopedics, ending a week or more before current illness. Admits significant stress.  Husband with lung cancer found to 3 weeks ago to have liver metastasis. Declines flu shot-"reaction" years ago.  Rapid flu swab today negative.  ROS-see HPI Constitutional:   No-   weight loss, night sweats, +fevers, chills,+ fatigue, lassitude. HEENT:   + headaches, difficulty swallowing, tooth/dental problems, sore throat,       No-  sneezing, itching, ear ache, nasal congestion, +post nasal drip,  CV:  No-   chest pain, orthopnea, PND, swelling in lower extremities, anasarca,                                  dizziness, palpitations Resp: + shortness of breath with exertion or at rest.              +   productive cough,  No non-productive cough,  No- coughing up of blood.              +  change in color of mucus.  No- wheezing.   Skin: No-   rash or lesions. GI:  + heartburn, indigestion, abdominal pain, nausea, vomiting,  GU:  MS:  + joint pain or swelling.   Neuro-     nothing unusual Psych:  No- change in mood or affect. + depression or +anxiety.  No memory loss.    Objective:  OBJ- Physical Exam  +looks tired// stressed General- Alert, Oriented, Affect-appropriate, Distress- none acute + obese Skin- rash-none, lesions- none, excoriation- none Lymphadenopathy- none Head- atraumatic            Eyes- Gross vision intact, PERRLA, conjunctivae and secretions clear            Ears- Hearing, canals-normal            Nose- Clear, no-Septal dev, mucus, polyps,  erosion, perforation             Throat- Mallampati II , mucosa clear , drainage- none, tonsils- atrophic Neck- flexible , trachea midline, no stridor , thyroid nl, carotid no bruit Chest - symmetrical excursion , unlabored           Heart/CV- RRR , no murmur , no gallop  , no rub, nl s1 s2                           - JVD- none , edema- none, stasis changes- none, varices- none           Lung- clear to P&A, wheeze- none, cough- none , dullness-none, rub- none + seemed breathless on arrival, during talking.           Chest wall-  Abd-  Br/ Gen/ Rectal- Not done, not indicated Extrem- cyanosis- none, clubbing, none, atrophy- none, strength- nl Neuro- grossly intact to observation     Assessment & Plan:

## 2018-01-24 NOTE — Patient Instructions (Signed)
Order- CXR   Dx bronchiectasis exacerbation  Order- lab- CBC w diff, BMET, BNP, D dimer     Dx dyspnea on exertion, bronchiectasis  Order- rapid flu assay     Recommend LOA from onset 01/09/18 to return to work Monday, 01/29/18

## 2018-01-24 NOTE — Progress Notes (Signed)
Called spoke with patient, advised of cxr results / recs as stated by CY.  Pt verbalized her understanding and denied any questions.

## 2018-01-25 ENCOUNTER — Telehealth: Payer: Self-pay | Admitting: Internal Medicine

## 2018-01-25 ENCOUNTER — Encounter: Payer: Self-pay | Admitting: Physical Therapy

## 2018-01-25 LAB — D-DIMER, QUANTITATIVE: D-Dimer, Quant: 0.41 mcg/mL FEU (ref <0.50)

## 2018-01-25 NOTE — Progress Notes (Signed)
Called spoke with patient, advised of lab results / recs as stated by CY.  Pt verbalized her understanding and denied any questions.

## 2018-01-25 NOTE — Assessment & Plan Note (Signed)
Acute exacerbation, viral type onset.  She had not had flu shot but too far along now for Tamiflu.  Strong impression that anxiety about her husband's metastatic lung cancer is part of the problem.  She is overweight and deconditioning is a factor as well. Plan-finish low-dose prednisone and Biaxin.  Lab for CBC with differential, BNP,D-dimer BMET. LOA through weekend. ADA form analogous to FMLA, for episodic acute bronchitis which may require LOA.

## 2018-01-25 NOTE — Telephone Encounter (Signed)
Spoke with patient. She stated that she was seen by CY on 2/13 and gave paperwork to him to fill out for disability. She had asked for this paperwork to be faxed once completed.   Advised patient that I would check with Joellen Jersey to check on status of paperwork. As soon as I got an answer, I would call her back. She verbalized understanding.

## 2018-01-26 ENCOUNTER — Ambulatory Visit: Payer: 59 | Admitting: Physical Therapy

## 2018-01-26 NOTE — Telephone Encounter (Signed)
Explained to [place] yesterday that [name] [name] completed his section and I faxed to company as requested while patient was here. Copy made for our record and pt was given originals back.

## 2018-01-26 NOTE — Telephone Encounter (Signed)
Katie, please advise on this. 

## 2018-01-26 NOTE — Telephone Encounter (Signed)
Spoke with patient. She is aware that the paperwork has been faxed. Nothing else needed at time of call.

## 2018-01-30 ENCOUNTER — Ambulatory Visit: Payer: PRIVATE HEALTH INSURANCE | Admitting: Physical Therapy

## 2018-01-30 DIAGNOSIS — M791 Myalgia, unspecified site: Secondary | ICD-10-CM | POA: Diagnosis not present

## 2018-01-30 DIAGNOSIS — R5383 Other fatigue: Secondary | ICD-10-CM | POA: Diagnosis not present

## 2018-01-31 ENCOUNTER — Emergency Department (HOSPITAL_COMMUNITY): Payer: 59

## 2018-01-31 ENCOUNTER — Encounter (HOSPITAL_COMMUNITY): Payer: Self-pay | Admitting: Family Medicine

## 2018-01-31 ENCOUNTER — Ambulatory Visit: Payer: PRIVATE HEALTH INSURANCE | Admitting: Physical Therapy

## 2018-01-31 ENCOUNTER — Emergency Department (HOSPITAL_COMMUNITY)
Admission: EM | Admit: 2018-01-31 | Discharge: 2018-01-31 | Disposition: A | Discharge status: Home / Self Care | Payer: 59 | Attending: Emergency Medicine | Admitting: Emergency Medicine

## 2018-01-31 DIAGNOSIS — R079 Chest pain, unspecified: Secondary | ICD-10-CM | POA: Diagnosis not present

## 2018-01-31 DIAGNOSIS — J449 Chronic obstructive pulmonary disease, unspecified: Secondary | ICD-10-CM | POA: Insufficient documentation

## 2018-01-31 DIAGNOSIS — R072 Precordial pain: Secondary | ICD-10-CM | POA: Diagnosis not present

## 2018-01-31 DIAGNOSIS — R5383 Other fatigue: Secondary | ICD-10-CM | POA: Diagnosis not present

## 2018-01-31 DIAGNOSIS — R0602 Shortness of breath: Secondary | ICD-10-CM | POA: Diagnosis not present

## 2018-01-31 DIAGNOSIS — R0600 Dyspnea, unspecified: Secondary | ICD-10-CM | POA: Diagnosis not present

## 2018-01-31 DIAGNOSIS — I10 Essential (primary) hypertension: Secondary | ICD-10-CM | POA: Diagnosis not present

## 2018-01-31 DIAGNOSIS — Z79899 Other long term (current) drug therapy: Secondary | ICD-10-CM | POA: Insufficient documentation

## 2018-01-31 DIAGNOSIS — A6920 Lyme disease, unspecified: Secondary | ICD-10-CM | POA: Diagnosis not present

## 2018-01-31 DIAGNOSIS — J42 Unspecified chronic bronchitis: Secondary | ICD-10-CM | POA: Diagnosis not present

## 2018-01-31 LAB — BLOOD GAS, ARTERIAL
Acid-Base Excess: 1.4 mmol/L (ref 0.0–2.0)
Bicarbonate: 21.6 mmol/L (ref 20.0–28.0)
Drawn by: 257701
O2 Saturation: 97.8 %
Patient temperature: 98.6
pCO2 arterial: 22.8 mmHg — ABNORMAL LOW (ref 32.0–48.0)
pH, Arterial: 7.584 — ABNORMAL HIGH (ref 7.350–7.450)
pO2, Arterial: 103 mmHg (ref 83.0–108.0)

## 2018-01-31 LAB — BASIC METABOLIC PANEL
Anion gap: 12 (ref 5–15)
BUN: 13 mg/dL (ref 6–20)
CO2: 23 mmol/L (ref 22–32)
Calcium: 9.3 mg/dL (ref 8.9–10.3)
Chloride: 104 mmol/L (ref 101–111)
Creatinine, Ser: 0.95 mg/dL (ref 0.44–1.00)
GFR calc Af Amer: >60 mL/min (ref >60)
GFR calc non Af Amer: >60 mL/min (ref >60)
Glucose, Bld: 93 mg/dL (ref 65–99)
Potassium: 3.8 mmol/L (ref 3.5–5.1)
Sodium: 139 mmol/L (ref 135–145)

## 2018-01-31 LAB — CBC
HCT: 40.8 % (ref 36.0–46.0)
Hemoglobin: 13.9 g/dL (ref 12.0–15.0)
MCH: 31.6 pg (ref 26.0–34.0)
MCHC: 34.1 g/dL (ref 30.0–36.0)
MCV: 92.7 fL (ref 78.0–100.0)
Platelets: 392 10*3/uL (ref 150–400)
RBC: 4.4 MIL/uL (ref 3.87–5.11)
RDW: 13.3 % (ref 11.5–15.5)
WBC: 11.1 10*3/uL — ABNORMAL HIGH (ref 4.0–10.5)

## 2018-01-31 LAB — I-STAT TROPONIN, ED: Troponin i, poc: 0 ng/mL (ref 0.00–0.08)

## 2018-01-31 LAB — BRAIN NATRIURETIC PEPTIDE: B Natriuretic Peptide: 13.9 pg/mL (ref 0.0–100.0)

## 2018-01-31 MED ORDER — IOPAMIDOL (ISOVUE-370) INJECTION 76%
INTRAVENOUS | Status: AC
  Administered 2018-01-31: 100 mL via INTRAVENOUS
  Filled 2018-01-31: qty 100

## 2018-01-31 MED ORDER — SODIUM CHLORIDE 0.9 % IJ SOLN
INTRAMUSCULAR | Status: AC
  Filled 2018-01-31: qty 50

## 2018-01-31 NOTE — Discharge Instructions (Signed)
Follow-up with your primary care doctor and pulmonologist as planned, return as needed for worsening symptoms

## 2018-01-31 NOTE — ED Notes (Signed)
Patient refused Chest x-ray. Patient wants to speak to doctor first because her primary care doctor said she would need a CT. Patient already had an X-ray last Wednesday.

## 2018-01-31 NOTE — ED Provider Notes (Signed)
[facility] DEPT Provider Note   CSN: [id number] Arrival date & time: 01/31/18  1616     History   Chief Complaint Chief Complaint  Patient presents with  . Chest Pain  . Shortness of Breath    HPI [name] [name] is a 63 y.o. female.  HPI Pt has been feeling fatigued for several weeks.  SHe has seen her primary care doctor as well as her pulmonary doctor. Over that period of time she has also been having trouble with shortness of breath.  She has been breaking out into sweats.  She also stopped taking her thyroid medicine.  She saw her primary care doctor today who was concerned about a possible PE.  She was sent to the ED to have that done.  Patient denies any trouble with leg swelling.  No fevers.  She has had some episodes of chest pain but she was attributing that to acid reflux, esophagitis.  She does not have any history of heart disease and she does not have any history of pulmonary embolism.  Past Medical History:  Diagnosis Date  . Anxiety   . Bronchiectasis   . Bronchitis    chronic  . 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 COPD (chronic obstructive pulmonary disease) ([place])   . Dental crown present    upper  . Depression   . Environmental allergies    receives allergy shots  . GERD (gastroesophageal reflux disease)   . 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 - no current med.  . Glucose intolerance (impaired glucose tolerance)    on Metformin  . H/O hiatal hernia    "sliding"  . History of echocardiogram    Echo 1/16: EF 60-65, no RWMA, Gr 1 DD, mild LAE, trivial pericardial eff post to heart  . History of echocardiogram    Echo 11/17: EF 55-60, no RWMA, Gr 1 DD, normal RV function.  Marland Kitchen History of palpitations    Holter 12/15: NSR, PACs  . Hx of migraines   . Hypothyroidism   . Osteoarthritis    knees  . Positional headache since 1997   if lies on left side    Patient Active Problem List   Diagnosis Date  Noted  . Adrenal disorder ([place]) 01/29/2015  . Palpable abdominal aorta 01/01/2015  . Heart palpitations 01/01/2015  . Hypothyroidism 01/01/2015  . Seasonal and perennial allergic rhinitis 07/02/2012  . Dyspnea 05/25/2012  . GERD (gastroesophageal reflux disease) 05/16/2012  . Chronic cough 05/16/2012  . Asthma with bronchitis 05/16/2012    Past Surgical History:  Procedure Laterality Date  . CHOLECYSTECTOMY  04/21/2011  . CHONDROPLASTY  10/19/2012   Procedure: CHONDROPLASTY;  Surgeon: Yvette Rack., MD;  Location: [facility];  Service: Orthopedics;  Laterality: Right;  . COLONOSCOPY    . KNEE ARTHROSCOPY  10/19/2012   Procedure: ARTHROSCOPY KNEE;  Surgeon: Yvette Rack., MD;  Location: [facility];  Service: Orthopedics;  Laterality: Right;  . KNEE ARTHROSCOPY Right 02/01/2013   Procedure: RIGHT KNEE ARTHROSCOPY WITH MEDIAL MENISCECTOMY, DEBRIDEMENT CHONDROMALACIA PATELLA;  Surgeon: Yvette Rack., MD;  Location: [facility];  Service: Orthopedics;  Laterality: Right;  RIGHT KNEE ARTHROSCOPY WITH MEDIAL MENISCECTOMY, DEBRIDEMENT CHONDROMALACIA PATELLA  . KNEE ARTHROSCOPY Left 03/15/2013   Procedure: LEFT KNEE ARTHROSCOPY WITH DEBRIDEMENT/SHAVING CHONDROPLASTY WITH MEDIAL MENISECTOMY;  Surgeon: Yvette Rack., MD;  Location: [facility];  Service: Orthopedics;  Laterality: Left;  . KNEE ARTHROSCOPY WITH LATERAL MENISECTOMY  10/19/2012   Procedure: KNEE ARTHROSCOPY WITH LATERAL MENISECTOMY;  Surgeon: Yvette Rack., MD;  Location: [facility];  Service: Orthopedics;  Laterality: Right;  . TONSILLECTOMY AND ADENOIDECTOMY     age 37  . TUBAL LIGATION  1986  . WISDOM TOOTH EXTRACTION      OB History    Gravida Para Term Preterm AB Living   4 4 4     4    SAB TAB Ectopic Multiple Live Births                   Home Medications    Prior to Admission medications   Medication Sig Start Date End Date Taking?  Authorizing Provider  albuterol (PROAIR HFA) 108 (90 BASE) MCG/ACT inhaler 2 puffs every 4 hours as needed only  if your can't catch your breath 12/15/14  Yes Tanda Rockers, MD  amLODipine (NORVASC) 5 MG tablet Take 5 mg by mouth daily. 01/11/18  Yes [provider]  B Complex Vitamins (B-COMPLEX/B-12) LIQD Place 5 mLs under the tongue daily at 2 PM daily at 2 PM.   Yes [provider]  celecoxib (CELEBREX) 200 MG capsule Take 200 mg by mouth daily. 12/15/17  Yes [provider]  Cholecalciferol (VITAMIN D) 2000 UNITS tablet Take 5,000 Units by mouth daily.    Yes [provider]  fexofenadine (ALLEGRA) 180 MG tablet Take 180 mg by mouth daily.   Yes [provider]  Iodine, Kel[place], TABS Take 1 tablet by mouth daily.   Yes [provider]  ipratropium-albuterol (DUONEB) 0.5-2.5 (3) MG/3ML SOLN Take 3 mLs by nebulization every 6 (six) hours as needed (wheezinf or shortness of breath (sob)).   Yes [provider]  levothyroxine (SYNTHROID, LEVOTHROID) 25 MCG tablet Take 25 mcg by mouth daily before breakfast.   Yes [provider]  liothyronine (CYTOMEL) 5 MCG tablet Take 5 mcg by mouth daily.   Yes [provider]  LORazepam (ATIVAN) 1 MG tablet Take 1 mg by mouth daily as needed. 12/28/17  Yes [provider]  magnesium oxide (MAG-OX) 400 MG tablet Take 800 mg by mouth daily.   Yes [provider]  mometasone (NASONEX) 50 MCG/ACT nasal spray PLACE 2 SPRAYS INTO THE NOSE DAILY. 06/09/16  Yes Young, Clinton D, MD  montelukast (SINGULAIR) 10 MG tablet TAKE 1 TABLET BY MOUTH ONCE DAILY 05/04/16  Yes Young, Tarri Fuller D, MD  Multiple Vitamins-Minerals (MULTIVITAMIN ADULT PO) Take 1 tablet by mouth daily.   Yes [provider]  nebivolol (BYSTOLIC) 10 MG tablet Take 1 tablet (10 mg total) by mouth daily. 12/15/14  Yes Tanda Rockers, MD  pantoprazole (PROTONIX) 40 MG tablet Take 1 tablet (40 mg total) by mouth  2 (two) times daily before a meal. 12/15/14  Yes Tanda Rockers, MD  Probiotic Product (PROBIOTIC DAILY) CAPS Take 1 capsule by mouth daily.   Yes [provider]  sertraline (ZOLOFT) 100 MG tablet Take 200 mg by mouth 2 (two) times daily. 200 mg in morning, 100 mg at bedtime   Yes [provider]  Tiotropium Bromide Monohydrate (SPIRIVA RESPIMAT) 1.25 MCG/ACT AERS Inhale 2 puffs into the lungs daily.   Yes [provider]  Travoprost, BAK Free, (TRAVATAN) 0.004 % SOLN ophthalmic solution Place 1 drop into both eyes at bedtime.   Yes [provider]  zolpidem (AMBIEN) 10 MG tablet Take 5 mg  by mouth at bedtime. 01/16/18  Yes [provider]  Nebulizers (NEBULIZER COMPRESSOR) MISC Use as directed 12/15/14   Tanda Rockers, MD    Family History Family History  Problem Relation Age of Onset  . Lung cancer Father 3       dies age 64  . Emphysema Father   . COPD Brother 2       died age 80  . Stroke Mother   . Hypertension Sister   . Thyroid disease Sister        Graves Disease    Social History Social History   Tobacco Use  . Smoking status: Never Smoker  . Smokeless tobacco: Never Used  Substance Use Topics  . Alcohol use: Yes    Alcohol/week: 1.0 oz    Types: 2 Standard drinks or equivalent per week    Comment: Twice a month   . Drug use: No     Allergies   Levaquin [levofloxacin] and Nickel   Review of Systems Review of Systems  All other systems reviewed and are negative.    Physical Exam Updated Vital Signs BP 127/69   Pulse 61   Temp 98.8 F (37.1 C) (Oral)   Resp 15   Ht 1.549 m (5\' 1" )   Wt 104.8 kg (231 lb)   LMP 12/12/2005   SpO2 99%   BMI 43.65 kg/m   Physical Exam  Constitutional: She appears well-developed and well-nourished. No distress.  HENT:  Head: Normocephalic and atraumatic.  Right Ear: External ear normal.  Left Ear: External ear normal.  Eyes: Conjunctivae are normal. Right eye exhibits no  discharge. Left eye exhibits no discharge. No scleral icterus.  Neck: Neck supple. No tracheal deviation present.  Cardiovascular: Normal rate, regular rhythm and intact distal pulses.  Pulmonary/Chest: Breath sounds normal. No stridor. No respiratory distress. She has no wheezes. She has no rales.  Hyperventilating  Abdominal: Soft. Bowel sounds are normal. She exhibits no distension. There is no tenderness. There is no rebound and no guarding.  Musculoskeletal: She exhibits no edema or tenderness.  Neurological: She is alert. She has normal strength. No cranial nerve deficit (no facial droop, extraocular movements intact, no slurred speech) or sensory deficit. She exhibits normal muscle tone. She displays no seizure activity. Coordination normal.  Skin: Skin is warm and dry. No rash noted.  Psychiatric: She has a normal mood and affect.  Nursing note and vitals reviewed.    ED Treatments / Results  Labs (all labs ordered are listed, but only abnormal results are displayed) Labs Reviewed  CBC - Abnormal; Notable for the following components:      Result Value   WBC 11.1 (*)    All other components within normal limits  BLOOD GAS, ARTERIAL - Abnormal; Notable for the following components:   pH, Arterial 7.584 (*)    pCO2 arterial 22.8 (*)    All other components within normal limits  BASIC METABOLIC PANEL  BRAIN NATRIURETIC PEPTIDE  I-STAT TROPONIN, ED    EKG  EKG Interpretation  Date/Time:  Wednesday January 31 2018 16:37:20 EST Ventricular Rate:  63 PR Interval:  138 QRS Duration: 82 QT Interval:  420 QTC Calculation: 429 R Axis:   75 Text Interpretation:  Normal sinus rhythm Low voltage QRS Borderline ECG No significant change since last tracing Confirmed by Dorie Rank [id number]) on 01/31/2018 4:44:08 PM       Radiology Ct Angio Chest Pe W And/or Wo Contrast  Result Date: 01/31/2018  CLINICAL DATA:  Midsternal chest pain and dyspnea. EXAM: CT ANGIOGRAPHY CHEST WITH  CONTRAST TECHNIQUE: Multidetector CT imaging of the chest was performed using the standard protocol during bolus administration of intravenous contrast. Multiplanar CT image reconstructions and MIPs were obtained to evaluate the vascular anatomy. CONTRAST:  161mL ISOVUE-370 IOPAMIDOL (ISOVUE-370) INJECTION 76% COMPARISON:  01/24/2018 CXR FINDINGS: Cardiovascular: The study is of quality for the evaluation of pulmonary embolism. There are no filling defects in the central, lobar, segmental or subsegmental pulmonary artery branches to suggest acute pulmonary embolism. Great vessels are normal in course and caliber. Mild cardiomegaly. No significant pericardial fluid/thickening. Common arterial trunk of the right brachiocephalic and left common carotid arteries. Mediastinum/Nodes: No discrete thyroid nodules. Unremarkable esophagus. No pathologically enlarged axillary, mediastinal or hilar lymph nodes. Lungs/Pleura: No pneumothorax. No pleural effusion. Nonspecific mild ground-glass opacities within both lungs possibly due to hypoventilatory change, alveolitis or pneumonitis. Small focus of tortuous vessels posterior segment right upper lobe. Upper abdomen: Cholecystectomy. No acute abnormality within the included upper abdomen. Musculoskeletal:  No aggressive appearing focal osseous lesions. Review of the MIP images confirms the above findings. IMPRESSION: 1. No acute pulmonary embolus. 2. No aortic aneurysm or dissection. 3. Nonspecific mild ground-glass appearance of the lungs which may represent hypoventilatory change or possibly an alveolitis/pneumonitis. 4. Mild cardiomegaly. Electronically Signed   By: Ashley Royalty M.D.   On: 01/31/2018 20:10    Procedures Procedures (including critical care time)  Medications Ordered in ED Medications  sodium chloride 0.9 % injection (not administered)  iopamidol (ISOVUE-370) 76 % injection (100 mLs Intravenous Contrast Given 01/31/18 1951)     Initial Impression /  Assessment and Plan / ED Course  I have reviewed the triage vital signs and the nursing notes.  Pertinent labs & imaging results that were available during my care of the patient were reviewed by me and considered in my medical decision making (see chart for details).  Clinical Course as of Jan 31 2045  Wed Jan 31, 2018  1906 ABG consistent with hyperventilation  [JK]    Clinical Course User Index [JK] Dorie Rank, MD    Patient presented to the emergency room with persistent shortness of breath.  She was sent to the emergency room to have a CT scan of the chest to rule out pulmonary embolism. patient has been seeing her primary care doctor as well as her pulmonologist.  She previously was on a course of steroids as well as a round of Augmentin.  In the emergency room the patient was initially hyperventilating.  That has improved with time.  Patient's laboratory tests are reassuring.  CT scan does not show any evidence of PE.  Possible mild pneumonitis noted.  I do not think additional antibiotics are necessary at this time considering the patient has been on 2 different courses of abx.  Discussed findings with the patient.  She will follow up with her doctors as an outpatient.  Final Clinical Impressions(s) / ED Diagnoses   Final diagnoses:  Shortness of breath    ED Discharge Orders    None       Dorie Rank, MD 01/31/18 2050

## 2018-01-31 NOTE — ED Notes (Signed)
Respiratory made aware of ABG needed.

## 2018-01-31 NOTE — ED Triage Notes (Signed)
Patient reports she was seen by her PCP today and yesterday for mid-sternal chest pain and shortness of breath. She was referred for a CT scan to rule out PE. Patient reports shortness of breath started before chest pain.

## 2018-02-01 ENCOUNTER — Telehealth: Payer: Self-pay | Admitting: Internal Medicine

## 2018-02-01 DIAGNOSIS — R0609 Other forms of dyspnea: Principal | ICD-10-CM

## 2018-02-01 DIAGNOSIS — R0600 Dyspnea, unspecified: Secondary | ICD-10-CM

## 2018-02-01 NOTE — Telephone Encounter (Signed)
We will redo the forms when the new ones come in.  I have reviewed the CT scan. I would like to get a better idea of her lung function.      Please schedule PFT     Dx dyspnea on exertion

## 2018-02-01 NOTE — Telephone Encounter (Signed)
Called and spoke with pt. Pt states that CY signed the incorrect section on disability form and did not complete dates of absence.   Correct days are 01/09/18, 01/11/18, 01/12/18, 01/15/18. 01/16/18,01/20/18,01/25/18,01/26/18, 01/1618,01/30/18 and 01/31/18 Forms have not been scanned into epic as of yet.  Pt states she is still experiencing increased sob. Pt states her PCP suspected a PE, CT was neg. I have lm for [employer] to request a clean copy of disability forms. Pt also states she was seen at [facility] ED yesterday and had CT scan. Pt has requested that CY review scan.  CY please advise. Thanks

## 2018-02-01 NOTE — Telephone Encounter (Signed)
Spoke with pt, aware of recs.   Pt will call back to schedule a PFT after speaking to her husband regarding their schedule.  Pt just needs to be scheduled for PFT when she calls back.

## 2018-02-02 ENCOUNTER — Ambulatory Visit (INDEPENDENT_AMBULATORY_CARE_PROVIDER_SITE_OTHER): Payer: 59 | Admitting: Internal Medicine

## 2018-02-02 DIAGNOSIS — R0609 Other forms of dyspnea: Secondary | ICD-10-CM | POA: Diagnosis not present

## 2018-02-02 DIAGNOSIS — R0600 Dyspnea, unspecified: Secondary | ICD-10-CM

## 2018-02-02 LAB — PULMONARY FUNCTION TEST
DL/VA % pred: 122 %
DL/VA: 5.56 ml/min/mmHg/L
DLCO cor % pred: 96 %
DLCO cor: 20.81 ml/min/mmHg
DLCO unc % pred: 97 %
DLCO unc: 21.12 ml/min/mmHg
FEF 25-75 Post: 3.66 L/sec
FEF 25-75 Pre: 3.77 L/sec
FEF2575-%Change-Post: -2 %
FEF2575-%Pred-Post: 171 %
FEF2575-%Pred-Pre: 176 %
FEV1-%Change-Post: 0 %
FEV1-%Pred-Post: 94 %
FEV1-%Pred-Pre: 94 %
FEV1-Post: 2.16 L
FEV1-Pre: 2.18 L
FEV1FVC-%Change-Post: 0 %
FEV1FVC-%Pred-Pre: 118 %
FEV6-%Change-Post: -2 %
FEV6-%Pred-Post: 80 %
FEV6-%Pred-Pre: 81 %
FEV6-Post: 2.31 L
FEV6-Pre: 2.37 L
FEV6FVC-%Pred-Post: 104 %
FEV6FVC-%Pred-Pre: 104 %
FVC-%Change-Post: -1 %
FVC-%Pred-Post: 77 %
FVC-%Pred-Pre: 78 %
FVC-Post: 2.34 L
FVC-Pre: 2.37 L
Post FEV1/FVC ratio: 92 %
Post FEV6/FVC ratio: 100 %
Pre FEV1/FVC ratio: 92 %
Pre FEV6/FVC Ratio: 100 %
RV % pred: 88 %
RV: 1.7 L
TLC % pred: 89 %
TLC: 4.25 L

## 2018-02-02 NOTE — Progress Notes (Signed)
PFT done today. 

## 2018-02-05 ENCOUNTER — Telehealth: Payer: Self-pay | Admitting: Internal Medicine

## 2018-02-05 MED ORDER — TIOTROPIUM BROMIDE MONOHYDRATE 1.25 MCG/ACT IN AERS: 2.0000 | INHALATION_SPRAY | Freq: Every day | RESPIRATORY_TRACT | 0 refills | Status: DC

## 2018-02-05 NOTE — Telephone Encounter (Signed)
Pt spoke with Burman Nieves about results and states she is keeping her June 2019 appt. No need to move up.

## 2018-02-05 NOTE — Telephone Encounter (Signed)
Spoke with pt during her PFT and pt is requesting an earlier appt with CY. Currently pt has an appt with CY on 6.13.2019. Pt also requested Spiriva 1.25 respimat samples, pt given sample after speaking with CY.   Dr. Annamaria Boots please advise if pt can be worked in sooner than 05/2018 appt. Thanks.

## 2018-02-07 ENCOUNTER — Telehealth: Payer: Self-pay | Admitting: Internal Medicine

## 2018-02-07 ENCOUNTER — Encounter: Payer: Self-pay | Admitting: Physical Therapy

## 2018-02-07 NOTE — Telephone Encounter (Signed)
Pt is calling back [phone]

## 2018-02-07 NOTE — Telephone Encounter (Signed)
Spoke with patient. She was checking on the status of the short term disability forms from [place]. Advised patient that I checked both fax machines and Katie's desk and did not see the forms. She stated that she would contact [employer] again.   She also stated that her PCP tested her for [facility] Spotted Fever and it came back positive. Because of this, a few more dates will need to be added to the form. She is requesting that the dates of 2/21, 22, 23, 24, 25, 26 and 27 to be added to her form.   Will route to [facility] for follow up on forms.

## 2018-02-08 DIAGNOSIS — R5383 Other fatigue: Secondary | ICD-10-CM | POA: Diagnosis not present

## 2018-02-08 DIAGNOSIS — R112 Nausea with vomiting, unspecified: Secondary | ICD-10-CM | POA: Diagnosis not present

## 2018-02-08 NOTE — Telephone Encounter (Signed)
We have not received any paperwork on patient. I have asked CY as well. Please have them refax to triage fax. Thanks.

## 2018-02-08 NOTE — Telephone Encounter (Signed)
Called and spoke to pt. Pt was calling to check on status. Advised pt we would check to see if we received the forms from [employer] to complete. Checked with CY and he has not received anything yet. Will send to [facility] to check if she has received any forms recently. Pt states [employer] re-faxed the forms on 2.27.2019.

## 2018-02-09 NOTE — Telephone Encounter (Signed)
Called pt who stated she called [employer] who stated they were going to refax the forms to our office.   Stated to pt we would keep a lookout for the forms and call her once the forms would be taken care of.  Pt expressed understanding. Will keep encounter open for now.

## 2018-02-09 NOTE — Telephone Encounter (Signed)
Called pt stating to her we still have not received the forms from [place] that she needs to have filled out by CY.  Stated to pt she needed to call her insurance back to have them refax the forms to our office.  Pt expressed understanding. Will leave this encounter open until forms have been received and filled out for pt.

## 2018-02-09 NOTE — Telephone Encounter (Signed)
Pt is returning call. Cb is [phone].

## 2018-02-12 ENCOUNTER — Ambulatory Visit: Payer: PRIVATE HEALTH INSURANCE

## 2018-02-13 NOTE — Telephone Encounter (Signed)
I have not rec'd any papers on patient. Will forward to CY to advise if he has them. If not we will need to have the patient resend forms. Thanks.   This should be going through [employer].

## 2018-02-13 NOTE — Telephone Encounter (Signed)
Leave encounter open.

## 2018-02-13 NOTE — Telephone Encounter (Signed)
Spoke with pt, advised her that she would have to send the forms again. Pt understood and will await forms.

## 2018-02-13 NOTE — Telephone Encounter (Signed)
Routing this to Katie to follow up on. Katie have you received any forms for this?

## 2018-02-14 DIAGNOSIS — I10 Essential (primary) hypertension: Secondary | ICD-10-CM | POA: Diagnosis not present

## 2018-02-14 DIAGNOSIS — I82611 Acute embolism and thrombosis of superficial veins of right upper extremity: Secondary | ICD-10-CM | POA: Diagnosis not present

## 2018-02-14 DIAGNOSIS — M7989 Other specified soft tissue disorders: Secondary | ICD-10-CM | POA: Diagnosis not present

## 2018-02-14 DIAGNOSIS — I808 Phlebitis and thrombophlebitis of other sites: Secondary | ICD-10-CM | POA: Diagnosis not present

## 2018-02-14 NOTE — Telephone Encounter (Signed)
New forms rec'd and placed on CY's cart.

## 2018-02-14 NOTE — Telephone Encounter (Signed)
Routing this to Joellen [place] so when she receives the new forms she will know what to place on them.

## 2018-02-14 NOTE — Telephone Encounter (Signed)
[name] [name] called from [employer] Dr. Annamaria Boots filled out wrong portion on the medical certification Pt needs continuous time off for Jan 29- Feb 17  [facility] ext [phone]  Fax [phone]

## 2018-02-15 ENCOUNTER — Telehealth: Payer: Self-pay | Admitting: Internal Medicine

## 2018-02-15 DIAGNOSIS — R5383 Other fatigue: Secondary | ICD-10-CM | POA: Diagnosis not present

## 2018-02-15 DIAGNOSIS — I10 Essential (primary) hypertension: Secondary | ICD-10-CM | POA: Diagnosis not present

## 2018-02-15 DIAGNOSIS — R0602 Shortness of breath: Secondary | ICD-10-CM | POA: Diagnosis not present

## 2018-02-15 DIAGNOSIS — Z20828 Contact with and (suspected) exposure to other viral communicable diseases: Secondary | ICD-10-CM | POA: Diagnosis not present

## 2018-02-15 NOTE — Telephone Encounter (Signed)
Routing this to [employer].

## 2018-02-15 NOTE — Telephone Encounter (Signed)
Called patient, she states that she was seen by CY on 2.13.19 with lab work and cxr. Patient states that she is still having shortness of breath. Patient saw her PCP who sent her to [facility] to rule out a PE. Patient states that her PCP would like for her to see Dr. Annamaria Boots due to the SOB increasing. CY please advise on this, thanks.

## 2018-02-15 NOTE — Telephone Encounter (Signed)
No openings today. We will call tomorrow when my nurse if back, to see what we can work out for her.

## 2018-02-15 NOTE — Telephone Encounter (Signed)
Dr. Annamaria Boots, please advise once forms been completed. Thanks

## 2018-02-15 NOTE — Telephone Encounter (Signed)
PT is calling back because she forgot to mention that her PCP thought she may Sleep Apnea and would like CY input on this. Cb is [phone].

## 2018-02-16 ENCOUNTER — Encounter: Payer: Self-pay | Admitting: Internal Medicine

## 2018-02-16 ENCOUNTER — Telehealth: Payer: Self-pay | Admitting: Internal Medicine

## 2018-02-16 ENCOUNTER — Ambulatory Visit: Payer: 59 | Admitting: Internal Medicine

## 2018-02-16 VITALS — BP 150/86 | HR 77 | Ht 61.0 in | Wt 226.0 lb

## 2018-02-16 DIAGNOSIS — I10 Essential (primary) hypertension: Secondary | ICD-10-CM

## 2018-02-16 DIAGNOSIS — R0602 Shortness of breath: Secondary | ICD-10-CM

## 2018-02-16 MED ORDER — NEBIVOLOL HCL 10 MG PO TABS: ORAL_TABLET | ORAL | 2 refills | Status: DC

## 2018-02-16 MED FILL — LIOTHYRONINE SODIUM 5 MCG T: 5 | 90 days supply | Qty: 90 | Fill #2

## 2018-02-16 MED FILL — LEVOTHYROXINE 25 MCG TABLET: 25 | 90 days supply | Qty: 90 | Fill #2

## 2018-02-16 MED FILL — SERTRALINE HCL 100 MG TAB: 100 | 30 days supply | Qty: 90 | Fill #2

## 2018-02-16 NOTE — Patient Instructions (Addendum)
Bystolic 10 mg one twice daily until your next visit   Protonix Take 30- 60 min before your first and last meals of the day    GERD (REFLUX)  is an extremely common cause of respiratory symptoms just like yours , many times with no obvious heartburn at all.    It can be treated with medication, but also with lifestyle changes including elevation of the head of your bed (ideally with 6 inch  bed blocks),  Smoking cessation, avoidance of late meals, excessive alcohol, and avoid fatty foods, chocolate, peppermint, colas, red wine, and acidic juices such as orange juice.  NO MINT OR MENTHOL PRODUCTS SO NO COUGH DROPS   USE SUGARLESS CANDY INSTEAD (Jolley ranchers or Stover's or Life Savers) or even ice chips will also do - the key is to swallow to prevent all throat clearing. NO OIL BASED VITAMINS - use powdered substitutes.  Stop spiriva   Only use your albuterol as a rescue medication to be used if you can't catch your breath by resting or doing a relaxed purse lip breathing pattern.  - The less you use it, the better it will work when you need it. - Ok to use up to 2 puffs  every 4 hours if you must but call for immediate appointment if use goes up over your usual need - Don't leave home without it !!  (think of it like the spare tire for your car)

## 2018-02-16 NOTE — Telephone Encounter (Signed)
Patient is calling back about appointment with Dr. Annamaria Boots for shortness of breath.  CB is [phone].

## 2018-02-16 NOTE — Telephone Encounter (Signed)
CY's next available appointment is on 05/21/18.  Please advise if pt can be worked in sooner.  Thanks!

## 2018-02-16 NOTE — Telephone Encounter (Signed)
Pt called our office again today due to pt's PCP wanting her to be seen.  Stated to pt if Dr. Melvyn Novas thinks some of pt's symptoms seem like they could be sleep apnea related that we could then get her scheduled for an appt with Dr. Annamaria Boots to have this addressed.  Stated to pt first we need to try to figure out the reason for increased SOB and fatigue.  Pt expressed understanding.  Scheduled pt for a visit with Dr. Melvyn Novas today at 2:45 to have the increased SOB checked out. Nothing further needed at this current time.

## 2018-02-16 NOTE — Progress Notes (Signed)
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 Remains on Allegra and singulair .  Presents for an acute office visit.  Complains of chills, body aches and scratchy sore throat for past 2 weeks..She saw eye doctor who told her to stop Nasonex due to increased optic pressure. She was told to try Astelin . Has been off Nasonex for 3 weeks.  Flu shot utd  Last cxr was in 03/2014 with PCP in [place], reported nml.  On Ace inhibitor .  rec Zpack take as directed.  Mucinex DM Twice daily  As needed  Cough/congestion .  Saline nasal rinses and gel As needed   Astelin 2 puffs At bedtime   Remain of Nasonex .  Fluids and rest  Would discuss with family doctor regarding Lisinopril -side effects of cough, may benefit from change   12/15/2014 acute ov/[name] [name] re:  Chronic Cough/ fatigue/ still on ACEi  Chief Complaint  Patient presents with  . Acute Visit    Pt of Dr Annamaria Boots. Pt c/o extreme fatigue- esp in the am since had infulenza vaccine 10/8/5. She also c/o chest tightness.   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   And went bad to worse after exposure to sick son the 28th dec then Dec 30th 2015 seen by Dr Reather Laurence made dx of lyme dz and started on doxy/flagyl but worse x 3 days body aches/fatigue/ harsh dry cough  rec Protonix 40 mg Take 30- 60 min before your first and last meals of the day Depomedrol 120 IM today Stop lisinopril and start bystolic 10 mg daily instead which should also help your palpitation  Work on inhaler technique: Only use your albuterol (proair or ventolin)as a rescue medication For cough mucinex or mucinex dm as needed  If cough or breathing problems continue while off lisinopril but you will need to bring meds in two  bags, maintenance and as needed.   02/16/2018  Act\ute ov/[name] [name] re:  Unexplained sob with nl pfts 02/02/18 / Wife of RT/ daughter is NP  Chief Complaint  Patient presents with  . Acute Visit    extreme fatigue and dyspnea progressively worse over the past month. She was out of breath just walking from lobby to exam room today. Worse in the am's.   She is using her ventolin inhaler 2 x daily on average and neb once per day.   after last ov 12/2014 cough 75% better but still flares freq Esp in winter    Some better while on allergy shots but stopped them ? When   Dyspnea grad worse x one m/ inhalers not helping any more : = doe x 50 ft short of breath / lie down x 30 min and eventually  gets better vs if stays  sitting no better/  spiriva smi not helping Cough: assoc with the spells of sob / also loss of voice  Sleep: ok horizontal r side  Has not worked in 5 weeks  New primary in [place] does not agree with disability or use of benzo's and searching for new pcp   No obvious patterns in day to day or daytime variability or assoc excess/ purulent sputum or  mucus plugs or hemoptysis or cp or chest tightness, subjective wheeze or overt sinus or hb symptoms. No unusual exposure hx or h/o childhood pna/ asthma or knowledge of premature birth.  Sleeping ok flat without nocturnal  or early am exacerbation  of respiratory  c/o's or need for noct saba. Also denies any obvious fluctuation of symptoms with weather or environmental changes or other aggravating or alleviating factors except as outlined above   Current Allergies, Complete Past Medical History, Past Surgical History, Family History, and Social History were reviewed in [employer] record.  ROS  The following are not active complaints unless bolded Hoarseness, sore throat, dysphagia, dental problems, itching, sneezing,  nasal congestion or discharge of excess mucus or purulent secretions, ear ache,   fever, chills, sweats,  unintended wt loss or wt gain, classically pleuritic or exertional cp,  orthopnea pnd or leg swelling, presyncope, palpitations, abdominal pain, anorexia, nausea, vomiting, diarrhea  or change in bowel habits or change in bladder habits, change in stools or change in urine, dysuria, hematuria,  rash, arthralgias, visual complaints, headache, numbness, weakness or ataxia or problems with walking or coordination,  change in mood/affect or memory.        Current Meds - not able to confirm this list is accurate   Medication Sig  . albuterol (PROAIR HFA) 108 (90 BASE) MCG/ACT inhaler 2 puffs every 4 hours as needed only  if your can't catch your breath  . B Complex Vitamins (B-COMPLEX/B-12) LIQD Place 5 mLs under the tongue daily at 2 PM daily at 2 PM.  . celecoxib (CELEBREX) 200 MG capsule Take 200 mg by mouth daily as needed.   . Cholecalciferol (VITAMIN D) 2000 UNITS tablet Take 5,000 Units by mouth daily.   . fexofenadine (ALLEGRA) 180 MG tablet Take 180 mg by mouth daily.  . Iodine, Kelp, TABS Take 1 tablet by mouth daily.  Marland Kitchen ipratropium-albuterol (DUONEB) 0.5-2.5 (3) MG/3ML SOLN Take 3 mLs by nebulization every 6 (six) hours as needed (wheezinf or shortness of breath (sob)).  Marland Kitchen levothyroxine (SYNTHROID, LEVOTHROID) 25 MCG tablet Take 25 mcg by mouth daily before breakfast.  . liothyronine (CYTOMEL) 5 MCG tablet Take 5 mcg by mouth daily.  Marland Kitchen LORazepam (ATIVAN) 1 MG tablet Take 1 mg by mouth daily as needed.  . magnesium oxide (MAG-OX) 400 MG tablet Take 800 mg by mouth daily.  . mometasone (NASONEX) 50 MCG/ACT nasal spray PLACE 2 SPRAYS INTO THE NOSE DAILY.  . montelukast (SINGULAIR) 10 MG tablet TAKE 1 TABLET BY MOUTH ONCE DAILY  . Multiple Vitamins-Minerals (MULTIVITAMIN ADULT PO) Take 1 tablet by mouth daily.  . nebivolol (BYSTOLIC) 10 MG tablet One twice daily  . Nebulizers (NEBULIZER COMPRESSOR) MISC Use as directed  . pantoprazole (PROTONIX) 40 MG tablet Take 1 tablet (40 mg total) by  mouth 2 (two) times daily before a meal.  . Probiotic Product (PROBIOTIC DAILY) CAPS Take 1 capsule by mouth daily.  . sertraline (ZOLOFT) 100 MG tablet Take 200 mg by mouth 2 (two) times daily. 200 mg in morning, 100 mg at bedtime  . Travoprost, BAK Free, (TRAVATAN) 0.004 % SOLN ophthalmic solution Place 1 drop into both eyes at bedtime.  Marland Kitchen zolpidem (AMBIEN) 10 MG tablet Take 5 mg by mouth at bedtime.  .  nebivolol (BYSTOLIC) 10 MG tablet Take 1 tablet (10 mg total) by mouth daily.  . [ Tiotropium Bromide Monohydrate (SPIRIVA RESPIMAT) 1.25 MCG/ACT AERS Inhale 2 puffs into the lungs daily.  Objective:   Physical Exam  amb wf  severe voice fatigue / panting for breath/ halting speech pattern "can't get a full breath in"   02/16/2018          226   12/15/14 152 lb 12.8 oz (69.31 kg)  10/02/14 152 lb (68.947 kg)  05/01/14 158 lb 9.6 oz (71.94 kg)      Vital signs reviewed - Note on arrival 02 sats  100% on RA and BP 150/86     HEENT: nl dentition, turbinates bilaterally, and oropharynx. Nl external ear canals without cough reflex   NECK :  without JVD/Nodes/TM/ nl carotid upstrokes bilaterally   LUNGS: no acc muscle use,  Nl contour chest which is clear to A and P bilaterally without cough on insp or exp maneuvers   CV:  RRR  no s3 or murmur or increase in P2, and no edema   ABD:  Tensely obese/   nontender with limited inspiratory excursion in the supine position. No bruits or organomegaly appreciated, bowel sounds nl  MS:  Nl gait/ ext warm without deformities, calf tenderness, cyanosis or clubbing No obvious joint restrictions   SKIN: warm and dry without lesions    NEURO:  alert, approp, nl sensorium with  no motor or cerebellar deficits apparent.       I personally reviewed images and agree with radiology impression as follows:   Chest CTa  01/31/18 1. No acute pulmonary embolus. 2. No aortic aneurysm or dissection. 3. Nonspecific mild ground-glass  appearance of the lungs which may represent hypoventilatory change or possibly an alveolitis/pneumonitis. 4. Mild cardiomegaly.   Labs 01/31/18  Er eval ABGs   PH  7.584/22.8/103  BNP = 14  Eos  0.1          Assessment & Plan:

## 2018-02-17 ENCOUNTER — Encounter: Payer: Self-pay | Admitting: Internal Medicine

## 2018-02-17 DIAGNOSIS — I10 Essential (primary) hypertension: Secondary | ICD-10-CM | POA: Insufficient documentation

## 2018-02-17 NOTE — Assessment & Plan Note (Signed)
Body mass index is 42.7 kg/m.  -  trending up Lab Results  Component Value Date   TSH 1.42 01/12/2015     Contributing to gerd risk/ doe/reviewed the need and the process to achieve and maintain neg calorie balance > defer f/u primary care including intermittently monitoring thyroid status.

## 2018-02-17 NOTE — Assessment & Plan Note (Signed)
Not optimally controlled and since no evidence of active asthma can use BB effectively here but In the setting of respiratory symptoms of unknown etiology,  It would be preferable to use bystolic, the most beta -1  selective Beta blocker available in sample form, with bisoprolol the most selective generic choice  on the market, at least on a trial basis, to make sure the spillover Beta 2 effects of the less specific Beta blockers are not contributing to this patient's symptoms.   rec bystolic 10 mg bid and Follow up per Primary Care planned

## 2018-02-17 NOTE — Assessment & Plan Note (Addendum)
PFT's   02/02/18  FEV1 2.16 (94 % ) ratio 92  p no % improvement from saba p ? prior to study with DLCO  97/96c % corrects to 122  % for alv volume  And ERV 10    Symptoms are markedly disproportionate to objective findings and not clear this is actually much of a  lung problem but pt does appear to have difficult to sort out respiratory symptoms of unknown origin for which  DDX  = almost all start with A and  include Adherence, Ace Inhibitors, Acid Reflux, Active Sinus Disease, Alpha 1 Antitripsin deficiency, Anxiety masquerading as Airways dz,  ABPA,  Allergy(esp in young), Aspiration (esp in elderly), Adverse effects of meds,  Active smokers, A bunch of PE's (a small clot burden can't cause this syndrome unless there is already severe underlying pulm or vascular dz with poor reserve), Anemia / thyroid dz, plus two Bs  = Bronchiectasis and Beta blocker use..and one C= CHF     Adherence is always the initial "prime suspect" and is a multilayered concern that requires a "trust but verify" approach in every patient - starting with knowing how to use medications, especially inhalers, correctly, keeping up with refills and understanding the fundamental difference between maintenance and prns vs those medications only taken for a very short course and then stopped and not refilled.  - if returns here to see me needs to do so with all meds in hand using a trust but verify approach to confirm accurate Medication  Reconciliation The principal here is that until we are certain that the  patients are doing what we've asked, it makes no sense to ask them to do more.   ? Anxiety related to restrictive changes from wt gain > hyperventilaton  > usually at the bottom of this list of usual suspects but should be much higher on this pt's based on H and P and note already on psychotropics  > needs new pcp or formal psych eval for access to approp rx including benzo's if approp but through this clinic  ? Acid (or non-acid)  GERD > always difficult to exclude as up to 75% of pts in some series report no assoc GI/ Heartburn symptoms> rec continue max (24h)  acid suppression and diet restrictions/ reviewed     ? Allergy/ asthma > on singulair, thinks allergy shots may have helped > directed back to Dr Annamaria Boots to sort out this issue and refer if needed to [facility] but no role for spiriva here.  ? Anemia/ thyroid dz > needs thyroid recheck per PCP    ? ACEi effects > off x years now, would not rechallenge in this setting - see hbp  ? BB effects > doubt with bystolic, the most selective BB on market > see hbp  ? A bunch of PE's > neg CTa  01/31/18   ? CHF >nl bnp excluded   I had an extended discussion with the patient reviewing all relevant studies completed to date and  lasting 25 minutes of a 40  minute acute office visit extremely complex pt not familiar to me as have not seen in > 3 y  With severe non-specific but potentially very serious refractory respiratory symptoms of uncertain and potentially multiple  etiologies.   Each maintenance medication was reviewed in detail including most importantly the difference between maintenance and prns and under what circumstances the prns are to be triggered using an action plan format that is not reflected in  the computer generated alphabetically organized AVS.    Please see AVS for specific instructions unique to this office visit that I personally wrote and verbalized to the the pt in detail and then reviewed with pt  by my nurse highlighting any changes in therapy/plan of care  recommended at today's visit.

## 2018-02-19 ENCOUNTER — Encounter: Payer: Self-pay | Admitting: Physician Assistant

## 2018-02-19 NOTE — Telephone Encounter (Signed)
The soonest apt we can offer is Tuesday March 26,2019 at 11:30am. Thanks.

## 2018-02-19 NOTE — Telephone Encounter (Signed)
I called pt back, 32 minute conversation  Spoke with pt, she is very upset but she thinks something is wrong with her. 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 She has taken a leave for her health condition and she states she cannot work this way. She is SOB, fatigue, and this is not her usual self. She is not functional  And she is very scared of what is going on with her. She does need a doctor to sign off on her leave of absence forms for work. She is a Therapist, sports at [employer] and needs someone to help with her paperwork until she can figure out what is going on with her. She also states she is not depressed and she know it seems this way since her husband has cancer but she disagrees. She is seeing Dr. Lovie Macadamia, her psychiatrist, this week.   She states it feels like rocky spotted fever because it feels like the same symptoms she had before. She has blood work from her PCP.   Made an appt with TP on Wednesday at 10:15. FYI CY.

## 2018-02-20 ENCOUNTER — Telehealth: Payer: Self-pay | Admitting: Internal Medicine

## 2018-02-20 ENCOUNTER — Encounter: Payer: Self-pay | Admitting: Internal Medicine

## 2018-02-20 MED FILL — GABAPENTIN 300 MG CAPSULE: 300 | 30 days supply | Qty: 90 | Fill #1

## 2018-02-20 MED FILL — ZOLPIDEM TARTRATE 10 MG TAB: 10 | 30 days supply | Qty: 30 | Fill #0

## 2018-02-20 NOTE — Telephone Encounter (Signed)
Per Sharla Kidney is working on forms today. Please refer to the phone note from 02/07/18 regarding this same request.

## 2018-02-20 NOTE — Telephone Encounter (Signed)
Dr. Young please advise.  Thanks! 

## 2018-02-21 ENCOUNTER — Ambulatory Visit: Payer: 59 | Admitting: Adult Health

## 2018-02-21 ENCOUNTER — Encounter: Payer: Self-pay | Admitting: Adult Health

## 2018-02-21 VITALS — BP 134/86 | HR 64 | Ht 61.0 in | Wt 229.0 lb

## 2018-02-21 DIAGNOSIS — R400 Somnolence: Secondary | ICD-10-CM

## 2018-02-21 DIAGNOSIS — J302 Other seasonal allergic rhinitis: Secondary | ICD-10-CM | POA: Diagnosis not present

## 2018-02-21 DIAGNOSIS — J3089 Other allergic rhinitis: Secondary | ICD-10-CM | POA: Diagnosis not present

## 2018-02-21 DIAGNOSIS — F4310 Post-traumatic stress disorder, unspecified: Secondary | ICD-10-CM | POA: Diagnosis not present

## 2018-02-21 DIAGNOSIS — R0602 Shortness of breath: Secondary | ICD-10-CM | POA: Diagnosis not present

## 2018-02-21 NOTE — Telephone Encounter (Signed)
As of 02/20/18 (telephone encounter), forms have been received and Dr. Annamaria Boots is currently working on these.

## 2018-02-21 NOTE — Patient Instructions (Addendum)
Go to Infectious Disease clinic as discussed.  Mucinex DM Twice daily  As needed  Cough/congestion .  Saline nasal rinses and gel As needed   Fluids and rest  Follow up with Dr. Annamaria Boots  Or [place] NP in 4 weeks and As needed

## 2018-02-21 NOTE — Progress Notes (Signed)
@Patient  ID: [name] [name], female    DOB: 08-12-1956, 62 y.o.   MRN: [id number]  Chief Complaint  Patient presents with  . 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 prior to study with DLCO  97/96c % corrects to 122  % for alv volume  And ERV 10 01/31/18 CT Chest >neg PE , mild GG   02/22/2018 Acute OV : Dyspnea  Patient returns with persistent shortness of breath and extreme fatigue over the last several months.  She is at the point where she has hardly any energy to do basic activities.  Is becoming hard for her to work.  Her breath wears out very easily.  Patient says it is causing her to be anxious and emotional.  Patient has a lot of underlying stress as her husband has metastatic colon cancer. Patient is currently undergoing a workup for possible [facility] spotted fever.  Lab work revealed a positive titer.  She was treated with doxycycline.  Patient says she did not find any type of tick.  She has been referred to infectious disease for further evaluation.  She says she does carry a diagnosis of Lyme disease from 2016. Has been unable to work due to her fatigue and shortness of breath. Has intermittent dry cough.. Has postnasal drip and drainage. She remains on Singulair daily.  Along with Allegra daily. PFT's   02/02/18   showed no airflow obstruction FEV1 2.16 (94 % ) ratio 92  p no % improvement from saba p ? prior to study with DLCO  97/96c % corrects to 122  % for alv volume  And ERV 10  CT chest January 31, 2018 showed no PE, , mild groundglass opacities possibly secondary to hypoventilation.  Labs .Showed no evidence of anemia.  Troponin and BNP were normal   Allergies  Allergen Reactions  . Levaquin [Levofloxacin] Other (See Comments)    MUSCLE PAIN  .  Nickel Rash    Immunization History  Administered Date(s) Administered  . Influenza Split 09/13/2011, 09/11/2012, 09/11/2013, 11/18/2014    Past Medical History:  Diagnosis Date  . Anxiety   . Bronchiectasis   . Bronchitis    chronic  . 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 COPD (chronic obstructive pulmonary disease) ([place])   . Dental crown present    upper  . Depression   . Environmental allergies    receives allergy shots  . GERD (gastroesophageal reflux disease)   . 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 - no current med.  . Glucose intolerance (impaired glucose tolerance)    on Metformin  . H/O hiatal hernia    "sliding"  . History of echocardiogram    Echo 1/16: EF 60-65, no RWMA, Gr 1 DD, mild LAE, trivial pericardial eff post to heart  . History of echocardiogram    Echo 11/17: EF 55-60, no RWMA, Gr 1 DD, normal RV function.  Marland Kitchen History of palpitations    Holter 12/15: NSR, PACs  . Hx of migraines   . Hypothyroidism   . Osteoarthritis    knees  . Positional headache since 1997   if lies on left side    Tobacco History:  Social History   Tobacco Use  Smoking Status Never Smoker  Smokeless Tobacco Never Used   Counseling given: Not Answered   Outpatient Encounter Medications as of 02/21/2018  Medication Sig  . albuterol (PROAIR HFA) 108 (90 BASE) MCG/ACT inhaler 2 puffs every 4 hours as needed only  if your can't catch your breath  . B Complex Vitamins (B-COMPLEX/B-12) LIQD Place 5 mLs under the tongue daily at 2 PM daily at 2 PM.  . celecoxib (CELEBREX) 200 MG capsule Take 200 mg by mouth daily as needed.   . Cholecalciferol (VITAMIN D) 2000 UNITS tablet Take 5,000 Units by mouth daily.   . fexofenadine (ALLEGRA) 180 MG tablet Take 180 mg by mouth daily.  . Iodine, Kelp, TABS Take 1 tablet by mouth daily.  Marland Kitchen ipratropium-albuterol (DUONEB) 0.5-2.5 (3) MG/3ML SOLN Take 3 mLs by nebulization every 6 (six) hours as needed (wheezinf or shortness  of breath (sob)).  Marland Kitchen levothyroxine (SYNTHROID, LEVOTHROID) 25 MCG tablet Take 25 mcg by mouth daily before breakfast.  . liothyronine (CYTOMEL) 5 MCG tablet Take 5 mcg by mouth daily.  Marland Kitchen LORazepam (ATIVAN) 1 MG tablet Take 1 mg by mouth daily as needed.  . magnesium oxide (MAG-OX) 400 MG tablet Take 800 mg by mouth daily.  . mometasone (NASONEX) 50 MCG/ACT nasal spray PLACE 2 SPRAYS INTO THE NOSE DAILY.  . montelukast (SINGULAIR) 10 MG tablet TAKE 1 TABLET BY MOUTH ONCE DAILY  . Multiple Vitamins-Minerals (MULTIVITAMIN ADULT PO) Take 1 tablet by mouth daily.  . nebivolol (BYSTOLIC) 10 MG tablet One twice daily  . Nebulizers (NEBULIZER COMPRESSOR) MISC Use as directed  . pantoprazole (PROTONIX) 40 MG tablet Take 1 tablet (40 mg total) by mouth 2 (two) times daily before a meal.  . Probiotic Product (PROBIOTIC DAILY) CAPS Take 1 capsule by mouth daily.  . sertraline (ZOLOFT) 100 MG tablet Take 200 mg by mouth 2 (two) times daily. 200 mg in morning, 100 mg at bedtime  . Travoprost, BAK Free, (TRAVATAN) 0.004 % SOLN ophthalmic solution Place 1 drop into both eyes at bedtime.  Marland Kitchen zolpidem (AMBIEN) 10 MG tablet Take 5 mg by mouth at bedtime.   No facility-administered encounter medications on file as of 02/21/2018.      Review of Systems  Constitutional:   No  weight loss, night sweats,  Fevers, chills, fatigue, or  lassitude.  HEENT:   No headaches,  Difficulty swallowing,  Tooth/dental problems, or  Sore throat,                No sneezing, itching, ear ache, nasal congestion, post nasal drip,   CV:  No chest pain,  Orthopnea, PND, swelling in lower extremities, anasarca, dizziness, palpitations, syncope.   GI  No heartburn, indigestion, abdominal pain, nausea, vomiting, diarrhea, change in bowel habits, loss of appetite, bloody stools.   Resp: No shortness of breath with exertion or at rest.  No excess mucus, no productive cough,  No non-productive cough,  No coughing up of blood.  No change  in color of mucus.  No wheezing.  No chest wall deformity  Skin: no rash or lesions.  GU: no dysuria, change in color of urine, no urgency or frequency.  No flank pain, no hematuria   MS:  No joint pain or swelling.  No decreased range of motion.  No back pain.    Physical Exam  BP 134/86 (BP Location: Left Arm, Cuff Size: Normal)   Pulse 64   Ht 5\' 1"  (  1.549 m)   Wt 229 lb (103.9 kg)   LMP 12/12/2005   SpO2 98%   BMI 43.27 kg/m   GEN: A/Ox3; pleasant , NAD, obese    HEENT:  [place]/AT,  EACs-clear, TMs-wnl, NOSE-clear, THROAT-clear, no lesions, no postnasal drip or exudate noted.   NECK:  Supple w/ fair ROM; no JVD; normal carotid impulses w/o bruits; no thyromegaly or nodules palpated; no lymphadenopathy.    RESP  Clear  P & A; w/o, wheezes/ rales/ or rhonchi. no accessory muscle use, no dullness to percussion  CARD:  RRR, no m/r/g, no peripheral edema, pulses intact, no cyanosis or clubbing.  GI:   Soft & nt; nml bowel sounds; no organomegaly or masses detected.   Musco: Warm bil, no deformities or joint swelling noted.   Neuro: alert, no focal deficits noted.    Skin: Warm, no lesions or rashes    Lab Results:  CBC    Component Value Date/Time   WBC 11.1 (H) 01/31/2018 1813   RBC 4.40 01/31/2018 1813   HGB 13.9 01/31/2018 1813   HCT 40.8 01/31/2018 1813   PLT 392 01/31/2018 1813   MCV 92.7 01/31/2018 1813   MCH 31.6 01/31/2018 1813   MCHC 34.1 01/31/2018 1813   RDW 13.3 01/31/2018 1813   LYMPHSABS 4.1 (H) 01/24/2018 1302   MONOABS 0.8 01/24/2018 1302   EOSABS 0.1 01/24/2018 1302   BASOSABS 0.1 01/24/2018 1302    BMET    Component Value Date/Time   NA 139 01/31/2018 1813   K 3.8 01/31/2018 1813   CL 104 01/31/2018 1813   CO2 23 01/31/2018 1813   GLUCOSE 93 01/31/2018 1813   BUN 13 01/31/2018 1813   CREATININE 0.95 01/31/2018 1813   CALCIUM 9.3 01/31/2018 1813   GFRNONAA >60 01/31/2018 1813   GFRAA >60 01/31/2018 1813    BNP    Component  Value Date/Time   BNP 13.9 01/31/2018 1813    ProBNP    Component Value Date/Time   PROBNP 23.0 01/24/2018 1302    Imaging: Dg Chest 2 View  Result Date: 01/24/2018 CLINICAL DATA:  Cough, shortness of breath EXAM: CHEST  2 VIEW COMPARISON:  11/10/2017 FINDINGS: The heart size and mediastinal contours are within normal limits. Both lungs are clear. The visualized skeletal structures are unremarkable. IMPRESSION: No active cardiopulmonary disease. Electronically Signed   By: Kathreen Devoid   On: 01/24/2018 15:19   Ct Angio Chest Pe W And/or Wo Contrast  Result Date: 01/31/2018 CLINICAL DATA:  Midsternal chest pain and dyspnea. EXAM: CT ANGIOGRAPHY CHEST WITH CONTRAST TECHNIQUE: Multidetector CT imaging of the chest was performed using the standard protocol during bolus administration of intravenous contrast. Multiplanar CT image reconstructions and MIPs were obtained to evaluate the vascular anatomy. CONTRAST:  131mL ISOVUE-370 IOPAMIDOL (ISOVUE-370) INJECTION 76% COMPARISON:  01/24/2018 CXR FINDINGS: Cardiovascular: The study is of quality for the evaluation of pulmonary embolism. There are no filling defects in the central, lobar, segmental or subsegmental pulmonary artery branches to suggest acute pulmonary embolism. Great vessels are normal in course and caliber. Mild cardiomegaly. No significant pericardial fluid/thickening. Common arterial trunk of the right brachiocephalic and left common carotid arteries. Mediastinum/Nodes: No discrete thyroid nodules. Unremarkable esophagus. No pathologically enlarged axillary, mediastinal or hilar lymph nodes. Lungs/Pleura: No pneumothorax. No pleural effusion. Nonspecific mild ground-glass opacities within both lungs possibly due to hypoventilatory change, alveolitis or pneumonitis. Small focus of tortuous vessels posterior segment right upper lobe. Upper abdomen: Cholecystectomy. No acute abnormality within the  included upper abdomen. Musculoskeletal:  No  aggressive appearing focal osseous lesions. Review of the MIP images confirms the above findings. IMPRESSION: 1. No acute pulmonary embolus. 2. No aortic aneurysm or dissection. 3. Nonspecific mild ground-glass appearance of the lungs which may represent hypoventilatory change or possibly an alveolitis/pneumonitis. 4. Mild cardiomegaly. Electronically Signed   By: Ashley Royalty M.D.   On: 01/31/2018 20:10     Assessment & Plan:   Seasonal and perennial allergic rhinitis Continue on current regimen   Plan  Patient Instructions  Go to Infectious Disease clinic as discussed.  Mucinex DM Twice daily  As needed  Cough/congestion .  Saline nasal rinses and gel As needed   Fluids and rest  Follow up with Dr. Annamaria Boots  Or Jalisa Sacco NP in 4 weeks and As needed        Dyspnea Ongoing dyspnea ? Etiology  Workup is unrevealing thus far  For now would keep follow up w/ ID to look at possible Lyme dz , RMSF .  Is unable to work due to severe fatigue and dyspnea .  On return consider CPST if able .   Plan  Patient Instructions  Go to Infectious Disease clinic as discussed.  Mucinex DM Twice daily  As needed  Cough/congestion .  Saline nasal rinses and gel As needed   Fluids and rest  Follow up with Dr. Annamaria Boots  Or Dorise Gangi NP in 4 weeks and As needed           Rexene Edison, NP 02/22/2018

## 2018-02-22 ENCOUNTER — Telehealth: Payer: Self-pay | Admitting: Adult Health

## 2018-02-22 NOTE — Assessment & Plan Note (Signed)
Continue on current regimen   Plan  Patient Instructions  Go to Infectious Disease clinic as discussed.  Mucinex DM Twice daily  As needed  Cough/congestion .  Saline nasal rinses and gel As needed   Fluids and rest  Follow up with Dr. Annamaria Boots  Or Bijan Ridgley NP in 4 weeks and As needed

## 2018-02-22 NOTE — Telephone Encounter (Signed)
Per TP: okay to write work note out till 3/29.  Will fax office note as requested

## 2018-02-22 NOTE — Telephone Encounter (Signed)
Spoke with pt. She is requesting TP to write her a note to be out of work until her next appointment which is on 03/09/18. Pt will also need her last OV note faxed to [facility] along with the work note.  TP - please advise. Thanks.

## 2018-02-22 NOTE — Telephone Encounter (Signed)
Patient had stopped me at the front desk during the checkout process asking for HST Asked pt to please while consulting with TP Per TP; okay for HST Order placed Patient is aware  Nothing further needed; will sign off

## 2018-02-22 NOTE — Assessment & Plan Note (Signed)
Ongoing dyspnea ? Etiology  Workup is unrevealing thus far  For now would keep follow up w/ ID to look at possible Lyme dz , RMSF .  Is unable to work due to severe fatigue and dyspnea .  On return consider CPST if able .   Plan  Patient Instructions  Go to Infectious Disease clinic as discussed.  Mucinex DM Twice daily  As needed  Cough/congestion .  Saline nasal rinses and gel As needed   Fluids and rest  Follow up with Dr. Annamaria Boots  Or Parrett NP in 4 weeks and As needed

## 2018-02-22 NOTE — Telephone Encounter (Signed)
Nothing further needed 

## 2018-02-23 NOTE — Telephone Encounter (Signed)
Forms have been completed. Pt is aware that I have faxed over the [employer] at [phone]. Forms have been mailed to patients home address (confirmed with patient).

## 2018-02-26 ENCOUNTER — Telehealth: Payer: Self-pay | Admitting: Adult Health

## 2018-02-26 NOTE — Telephone Encounter (Addendum)
Called and spoke with patient she stated that she has not heard anything from [employer] regarding her work and she is needing that information to give to her supervisor today. I advised patient that I would contact [employer] and also speak with Parke Poisson in regards to what was faxed Friday. Patient stated that I was being very rude and not willing to help her by not giving her the information she needed. I advised patient that I was not trying to be or sound rude but that I could not give her any other information regarding the paperwork until I spoke with [employer] to find out more information myself to give her. Patient stated that I was not able to cooperate with her to give her what she needed. Advised patient that I would call [employer] and see what I could do. Patient continued to say I was not helping her.  Called Zertie with [employer] unable to reach left a message to give [place] a call back. Will await to fax to [employer] until I hear back from [employer]

## 2018-02-26 NOTE — Telephone Encounter (Signed)
Pt is returning call. Per Pt she needs paper work stating TP is taking her out of work till 3/29. faxed to [employer] again. Cb is [phone].

## 2018-02-26 NOTE — Progress Notes (Signed)
Letter printed and sent to [employer]

## 2018-02-26 NOTE — Progress Notes (Signed)
Letter printed per ok by TP. Faxed to [employer]

## 2018-02-26 NOTE — Telephone Encounter (Signed)
Letter created and faxed to [employer] along with last ov note (fax [phone]) Pt aware and states nothing further needed

## 2018-02-27 ENCOUNTER — Ambulatory Visit: Payer: 59 | Admitting: Family

## 2018-02-27 ENCOUNTER — Telehealth: Payer: Self-pay | Admitting: Internal Medicine

## 2018-02-27 ENCOUNTER — Encounter: Payer: Self-pay | Admitting: Family

## 2018-02-27 VITALS — BP 155/87 | HR 66 | Temp 99.7°F | Resp 20 | Ht 61.0 in | Wt 228.0 lb

## 2018-02-27 DIAGNOSIS — R5382 Chronic fatigue, unspecified: Secondary | ICD-10-CM

## 2018-02-27 NOTE — Progress Notes (Signed)
Subjective:    Patient ID: [name] [name], female    DOB: 12-31-1955, 62 y.o.   MRN: [id number]  Chief Complaint  Patient presents with  . Fatigue  . Shortness of Breath    HPI:  [name] [name] is a 62 y.o. female who presents today for extreme fatigue and dyspnea.   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 The severity of her symptoms limits her to light duties for 5-10 minutes without having to sit or lie down. Timing of her symptoms improves over the course of the day and is better in the late afternoon but continues to have symptoms. She was diagnosed with Lyme Disease in December 2015 and was treated with doxycycline and flagyl. During her most recent visits with Pulmonology with concern for sleep apnea or GERD/hiatal hernia. She was been seen by her primary care provider with blood work being negative with the exception of a RMSF "titer". She is currently under a lot of stress as her husband has been diagnosed with metastatic colon cancer. Troponin and BNP were normal.  She continues to have shortness of breath especially with exertion and increasing fatigue. She has been treated with courses of Augmentin and Biaxin. Reports she was seen by her primary care with concern for possible PE. She was referred to ED with the resulting CT scan which was negative. She was given doxycycline with concern for RMSF as her IgM test was positive with a negative IgG. Her Lyme test was IgG/IgM negative. Symptoms of fatigue are worse in the morning to cause her to have to sit down. She has a subjective fever because she feels chills. She denies any tick bites that she is aware of. Denies rashes, nausea, vomiting, diarrhea, or constipation. [place] and has several animals including horses and dogs that she notes do have ticks on them.   She states that she has had  her thyroid tested in the past and seen a naturopathic provider who recommended she start a low dose of levothyroxine. She is unsure if it helped, however noted that she did have increased sweating for a period of time. She also continues to be followed by psychiatry who she stated she is not depressed "because he has seen her depressed when she had post-partum depression."    Allergies  Allergen Reactions  . Levaquin [Levofloxacin] Other (See Comments)    MUSCLE PAIN  . Nickel Rash      Outpatient Medications Prior to Visit  Medication Sig Dispense Refill  . albuterol (PROAIR HFA) 108 (90 BASE) MCG/ACT inhaler 2 puffs every 4 hours as needed only  if your can't catch your breath 1 Inhaler 1  . B Complex Vitamins (B-COMPLEX/B-12) LIQD Place 5 mLs under the tongue daily at 2 PM daily at 2 PM.    . celecoxib (CELEBREX) 200 MG capsule Take 200 mg by mouth daily as needed.   2  . Cholecalciferol (VITAMIN D) 2000 UNITS tablet Take 5,000 Units by mouth daily.     . fexofenadine (ALLEGRA) 180 MG tablet Take 180 mg by mouth daily.    Marland Kitchen ipratropium-albuterol (DUONEB) 0.5-2.5 (3) MG/3ML SOLN Take 3 mLs by nebulization every 6 (six) hours as needed (wheezinf or shortness of breath (sob)).    Marland Kitchen levothyroxine (SYNTHROID, LEVOTHROID) 25 MCG tablet Take 25 mcg by mouth  daily before breakfast.    . liothyronine (CYTOMEL) 5 MCG tablet Take 5 mcg by mouth daily.    Marland Kitchen LORazepam (ATIVAN) 1 MG tablet Take 1 mg by mouth daily as needed.  0  . mometasone (NASONEX) 50 MCG/ACT nasal spray PLACE 2 SPRAYS INTO THE NOSE DAILY. 17 g 3  . montelukast (SINGULAIR) 10 MG tablet TAKE 1 TABLET BY MOUTH ONCE DAILY 90 tablet 0  . nebivolol (BYSTOLIC) 10 MG tablet One twice daily 60 tablet 2  . Nebulizers (NEBULIZER COMPRESSOR) MISC Use as directed 1 each 0  . pantoprazole (PROTONIX) 40 MG tablet Take 1 tablet (40 mg total) by mouth 2 (two) times daily before a meal.    . Probiotic Product (PROBIOTIC DAILY) CAPS Take 1  capsule by mouth daily.    . sertraline (ZOLOFT) 100 MG tablet Take 200 mg by mouth 2 (two) times daily. 200 mg in morning, 100 mg at bedtime    . Travoprost, BAK Free, (TRAVATAN) 0.004 % SOLN ophthalmic solution Place 1 drop into both eyes at bedtime.    Marland Kitchen zolpidem (AMBIEN) 10 MG tablet Take 5 mg by mouth at bedtime.  0  . Iodine, Kelp, TABS Take 1 tablet by mouth daily.    . magnesium oxide (MAG-OX) 400 MG tablet Take 800 mg by mouth daily.    . Multiple Vitamins-Minerals (MULTIVITAMIN ADULT PO) Take 1 tablet by mouth daily.     No facility-administered medications prior to visit.      Past Medical History:  Diagnosis Date  . Anxiety   . Bronchiectasis   . Bronchitis    chronic  . 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 COPD (chronic obstructive pulmonary disease) ([place])   . Dental crown present    upper  . Depression   . Environmental allergies    receives allergy shots  . GERD (gastroesophageal reflux disease)   . 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 - no current med.  . Glucose intolerance (impaired glucose tolerance)    on Metformin  . H/O hiatal hernia    "sliding"  . History of echocardiogram    Echo 1/16: EF 60-65, no RWMA, Gr 1 DD, mild LAE, trivial pericardial eff post to heart  . History of echocardiogram    Echo 11/17: EF 55-60, no RWMA, Gr 1 DD, normal RV function.  Marland Kitchen History of palpitations    Holter 12/15: NSR, PACs  . Hx of migraines   . Hypothyroidism   . Osteoarthritis    knees  . Positional headache since 1997   if lies on left side      Past Surgical History:  Procedure Laterality Date  . CHOLECYSTECTOMY  04/21/2011  . CHONDROPLASTY  10/19/2012   Procedure: CHONDROPLASTY;  Surgeon: Yvette Rack., MD;  Location: [facility];  Service: Orthopedics;  Laterality: Right;  . COLONOSCOPY    . KNEE ARTHROSCOPY  10/19/2012   Procedure: ARTHROSCOPY KNEE;  Surgeon: Yvette Rack., MD;  Location: [facility];   Service: Orthopedics;  Laterality: Right;  . KNEE ARTHROSCOPY Right 02/01/2013   Procedure: RIGHT KNEE ARTHROSCOPY WITH MEDIAL MENISCECTOMY, DEBRIDEMENT CHONDROMALACIA PATELLA;  Surgeon: Yvette Rack., MD;  Location: [facility];  Service: Orthopedics;  Laterality: Right;  RIGHT KNEE ARTHROSCOPY WITH MEDIAL MENISCECTOMY, DEBRIDEMENT CHONDROMALACIA PATELLA  . KNEE ARTHROSCOPY Left 03/15/2013   Procedure: LEFT KNEE ARTHROSCOPY WITH DEBRIDEMENT/SHAVING CHONDROPLASTY WITH MEDIAL MENISECTOMY;  Surgeon: Lockie Pares  Brooke Bonito., MD;  Location: [facility];  Service: Orthopedics;  Laterality: Left;  . KNEE ARTHROSCOPY WITH LATERAL MENISECTOMY  10/19/2012   Procedure: KNEE ARTHROSCOPY WITH LATERAL MENISECTOMY;  Surgeon: Yvette Rack., MD;  Location: [facility];  Service: Orthopedics;  Laterality: Right;  . TONSILLECTOMY AND ADENOIDECTOMY     age 80  . TUBAL LIGATION  1986  . WISDOM TOOTH EXTRACTION        Family History  Problem Relation Age of Onset  . Lung cancer Father 1       dies age 40  . Emphysema Father   . COPD Brother 33       died age 46  . Stroke Mother   . Hypertension Sister   . Thyroid disease Sister        Graves Disease      Social History   Socioeconomic History  . Marital status: Married    Spouse name: Not on file  . Number of children: Not on file  . Years of education: Not on file  . Highest education level: Not on file  Social Needs  . Financial resource strain: Not on file  . Food insecurity - worry: Not on file  . Food insecurity - inability: Not on file  . Transportation needs - medical: Not on file  . Transportation needs - non-medical: Not on file  Occupational History  . Occupation: Land    Employer: [employer]  Tobacco Use  . Smoking status: Never Smoker  . Smokeless tobacco: Never Used  Substance and Sexual Activity  . Alcohol use: Yes    Alcohol/week: 1.0 oz    Types: 2 Standard drinks or equivalent  per week    Comment: Twice a month   . Drug use: No  . Sexual activity: Yes    Partners: Male    Birth control/protection: Post-menopausal  Other Topics Concern  . Not on file  Social History Narrative  . Not on file     Review of Systems  Constitutional: Positive for chills, fatigue and fever. Negative for appetite change.  Respiratory: Positive for shortness of breath. Negative for cough, chest tightness and wheezing.   Cardiovascular: Negative for chest pain.  Gastrointestinal: Negative for abdominal pain, constipation, diarrhea, nausea and vomiting.  Genitourinary: Negative for dysuria, flank pain, frequency and urgency.  Musculoskeletal: Positive for myalgias.  Skin: Negative for rash.  Neurological: Negative for dizziness, syncope and light-headedness.  Hematological: Negative for adenopathy. Does not bruise/bleed easily.       Objective:    BP (!) 155/87 (BP Location: Left Arm, Patient Position: Sitting, Cuff Size: Large)   Pulse 66   Temp 99.7 F (37.6 C) (Oral)   Resp 20   Ht 5\' 1"  (1.549 m)   Wt 228 lb (103.4 kg)   LMP 12/12/2005   SpO2 99%   BMI 43.08 kg/m  Nursing note and vital signs reviewed.  Physical Exam  Constitutional: She is oriented to person, place, and time. She appears well-developed and well-nourished. No distress.   Seated in the chair, talks in full sentences with occasional breathless moments.   Eyes: Conjunctivae are normal.  Neck: Neck supple.  Cardiovascular: Normal rate, regular rhythm, normal heart sounds and intact distal pulses. Exam reveals no gallop and no friction rub.  No murmur heard. Pulmonary/Chest: Effort normal and breath sounds normal. No respiratory distress. She has no wheezes. She has no rales. She exhibits no tenderness.  Abdominal: Soft.  Bowel sounds are normal.  Lymphadenopathy:    She has no cervical adenopathy.  Neurological: She is alert and oriented to person, place, and time.  Skin: Skin is warm and dry.    Psychiatric: Her behavior is normal. Judgment and thought content normal. Her mood appears anxious.        Assessment & Plan:   Problem List Items Addressed This Visit      Other   Chronic fatigue - Primary    Ms Ezelle appears to have a chronic fatigue of undetermined origin which does not appear to be infectious. Unfortunately her Lyme tests were unavailable for review to confirm diagnosis. I reviewed her RMSF labs which are questionable, however she was treated with doxycycline that if it were indeed positive would have resolved her symptoms. I think other areas need evaluated to rule out other underlying causes including sleep apnea or underlying cardiovascular disease. Psychological origin also remains a possibility as she is under a significant amount of stress. Certainly conditions like chronic fatigue syndrome and fibromyalgia cannot be ruled out. Recommend continued work up with internal medicine. We are happy to see her back as needed.           I am having Shyrl Numbers. Bullen maintain her Vitamin D, B-Complex/B-12, Travoprost (BAK Free), magnesium oxide, liothyronine, levothyroxine, pantoprazole, albuterol, Nebulizer Compressor, Multiple Vitamins-Minerals (MULTIVITAMIN ADULT PO), Iodine (Kelp), PROBIOTIC DAILY, montelukast, mometasone, ipratropium-albuterol, sertraline, fexofenadine, celecoxib, LORazepam, zolpidem, and nebivolol.   A total of 50 minutes were spent face-to-face with the patient during this encounter and over half of that time was spent on counseling and coordination of care.  We discussed her current symptoms, prevalence/incidence and treatment of Lyme Disease and RMSF and reviewed her blood work, and explored options and directions for plans of treatment including follow up with internal medicine and possibly cardiology.   Follow-up: As needed.    Mauricio Po, [facility] for [facility]

## 2018-02-27 NOTE — Patient Instructions (Addendum)
Nice to meet you.  It appears that this is not an infectious process and would recommend continued evaluation for fatigue including cardiovascular assessment and sleep apnea evaluation.  This could be related to stress and fibromyalgia and chronic fatigue syndrome cannot be fully ruled out.   Recommend finding a new primary care provider to help manage your symptoms. Would recommend [facility] or [facility]. .  Consider talking with cardiology for further evaluation.

## 2018-02-27 NOTE — Telephone Encounter (Signed)
Patient called and requested to speak to Tammy D. - pt felt having difficulty getting forms to [employer] - pt wanted Tammy to send the documents that were completed by [facility] to be scanned and emailed to her. Recommended that she speak with Daneil Dan to discuss this - transferred call to [place] -pr

## 2018-02-27 NOTE — Telephone Encounter (Signed)
Spoke with pt. Pt states [employer] has not received the latest information regarding pt's leave of absence. Per 02/07/2018 phone note the form was faxed by Joellen Jersey, CMA, on 02/23/2018 to [place], as this is a form from [place]. Pt is requesting this form be faxed to [employer] to [employer] at [phone], this has been faxed and confirmation has been received. Pt is also requesting we fax the letter writing pt out of work until 3.29.19 to her director at her place of work at [phone], this has been done with confirmation. Pt aware this has been done. Will forward to Katie to follow up with pt in a few days to assure nothing further is needed before closing off on phone note.

## 2018-02-28 ENCOUNTER — Encounter: Payer: Self-pay | Admitting: Family

## 2018-02-28 DIAGNOSIS — R5382 Chronic fatigue, unspecified: Secondary | ICD-10-CM | POA: Insufficient documentation

## 2018-02-28 NOTE — Assessment & Plan Note (Signed)
[name] [name] appears to have a chronic fatigue of undetermined origin which does not appear to be infectious. Unfortunately her Lyme tests were unavailable for review to confirm diagnosis. I reviewed her RMSF labs which are questionable, however she was treated with doxycycline that if it were indeed positive would have resolved her symptoms. I think other areas need evaluated to rule out other underlying causes including sleep apnea or underlying cardiovascular disease. Psychological origin also remains a possibility as she is under a significant amount of stress. Certainly conditions like chronic fatigue syndrome and fibromyalgia cannot be ruled out. Recommend continued work up with internal medicine. We are happy to see her back as needed.

## 2018-02-28 NOTE — Addendum Note (Signed)
Addended by: Mauricio Po D on: 02/28/2018 09:48 AM   Modules accepted: Level of Service

## 2018-03-01 ENCOUNTER — Ambulatory Visit: Payer: Self-pay | Admitting: Physician Assistant

## 2018-03-01 ENCOUNTER — Telehealth: Payer: Self-pay | Admitting: Cardiovascular Disease

## 2018-03-01 NOTE — Telephone Encounter (Signed)
Pt c/o Shortness Of Breath: STAT if SOB developed within the last 24 hours or pt is noticeably SOB on the phone  1. Are you currently SOB (can you hear that pt is SOB on the phone)? Yes   2. How long have you been experiencing SOB? For a while   3. Are you SOB when sitting or when up moving around? All the time   4. Are you currently experiencing any other symptoms? Extreme fatigue, pain in arms and legs, nausea and headaches     Patient states that she was seen in [facility] ED 01-31-18 and has been to just about every type of specialist.

## 2018-03-01 NOTE — Telephone Encounter (Signed)
Spoke with patient who states she saw Dr. Elna Breslow recently and was advised to get a cardiology evaluation for "breathlessness."  She was last seen by She states she is a patient of Dr. Janee Morn and is scheduled for a home sleep study. She states she has been taken out of work by Rexene Edison, NP from her work as a Marine scientist at [employer]. She states she has not worked since 1/29. She complains of occasional chest tightness, does not think it is anxiety related; states it occurs intermittently and is not very active so she is unable to determine if she has DOE. She states she wakes up breathless and sometimes wakes up with chest discomfort. 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 She has had multiple tests done and no explanation for breathlessness. States her pulse ox always normal. States can only do 5-10 min of light duty house work before breaking into a sweat States HR usually 60-70 bpm. Last BP 155/85 mmHg at Dr. Purcell Nails office. She is requesting a soon appointment. I was unable to find an appointment in the next week for her. She would prefer to see Dr. Acie Fredrickson or a nurse practitioner. I advised Dr. Acie Fredrickson is working in the hospital next week and that I will send her request to scheduling to search for an appointment. I spent 25 minutes on the phone with her to determine need for cardiac work-up. Her symptoms seem vague but she has not had recent cardiac testing and feels that she needs some tests to determine symptoms are not cardiac related. she thanked me for listening to her.

## 2018-03-05 MED FILL — TRAVATAN Z 0.004% EYE DROP: 0.004 | 30 days supply | Qty: 3 | Fill #2

## 2018-03-05 NOTE — Telephone Encounter (Signed)
Called to offer patient an appointment with Pecolia Ades, NP tomorrow. I advised patient to call back and request that appointment if that works for her schedule.

## 2018-03-05 NOTE — Telephone Encounter (Signed)
Will close note and send papers to scan in Epic as no calls back from patient or company. Nothing more needed at this time.

## 2018-03-06 ENCOUNTER — Ambulatory Visit: Payer: 59 | Admitting: Cardiology

## 2018-03-06 ENCOUNTER — Telehealth: Payer: Self-pay | Admitting: Internal Medicine

## 2018-03-06 ENCOUNTER — Encounter: Payer: Self-pay | Admitting: Cardiology

## 2018-03-06 VITALS — BP 130/88 | HR 62 | Ht 61.0 in | Wt 227.4 lb

## 2018-03-06 DIAGNOSIS — R079 Chest pain, unspecified: Secondary | ICD-10-CM

## 2018-03-06 DIAGNOSIS — R0602 Shortness of breath: Secondary | ICD-10-CM | POA: Diagnosis not present

## 2018-03-06 DIAGNOSIS — I10 Essential (primary) hypertension: Secondary | ICD-10-CM | POA: Diagnosis not present

## 2018-03-06 NOTE — Telephone Encounter (Signed)
Called medical records at [facility], no one answered. Will call back later.

## 2018-03-06 NOTE — Telephone Encounter (Signed)
I do not; contact scanning to see if they can pull from the our scanning files and scan or fax to [place]. Thanks.

## 2018-03-06 NOTE — Patient Instructions (Addendum)
Medication Instructions:  Your physician recommends that you continue on your current medications as directed. Please refer to the Current Medication list given to you today.  Labwork: You will need to have a BMET drawn prior to your Coronary CT.  Testing/Procedures: Your provider recommends that you have a Coronary CT performed.  Your physician has requested that you have an echocardiogram. Echocardiography is a painless test that uses sound waves to create images of your heart. It provides your doctor with information about the size and shape of your heart and how well your heart's chambers and valves are working. This procedure takes approximately one hour. There are no restrictions for this procedure.   Follow-Up: Your physician recommends that you schedule a follow-up appointment after testing is completed with Dr. Acie Fredrickson or APP.    Any Other Special Instructions Will Be Listed Below (If Applicable).  Please arrive at the [facility] main entrance of [facility] at 30-45 minutes prior to test start time.  [facility] [address] [place], [place] [address] [phone]  Proceed to the [facility] Radiology Department (First Floor).  Please follow these instructions carefully (unless otherwise directed):  Hold all erectile dysfunction medications at least 48 hours prior to test.  On the Night Before the Test: . Drink plenty of water. . Do not consume any caffeinated/decaffeinated beverages or chocolate 12 hours prior to your test. . Do not take any antihistamines 12 hours prior to your test. . If you take Metformin do not take 24 hours prior to test.  On the Day of the Test: . Drink plenty of water. Do not drink any water within one hour of the test. . Do not eat any food 4 hours prior to the test. . You may take your regular medications prior to the test. . HOLD Furosemide morning of the test.  After the Test: . Drink plenty of water. . After  receiving IV contrast, you may experience a mild flushed feeling. This is normal. . On occasion, you may experience a mild rash up to 24 hours after the test. This is not dangerous. If this occurs, you can take Benadryl 25 mg and increase your fluid intake. . If you experience trouble breathing, this can be serious. If it is severe call 911 IMMEDIATELY. If it is mild, please call our office. . If you take any of these medications: Glipizide/Metformin, Avandament, Glucavance, please do not take 48 hours after completing test.    If you need a refill on your cardiac medications before your next appointment, please call your pharmacy.

## 2018-03-06 NOTE — Telephone Encounter (Signed)
Spoke with pt. She states that [employer] still has not received her LOA paperwork. Per our office documentation, these forms were sent to scan on 03/05/18.  Joellen Jersey - you still wouldn't happen to have these forms would you? Thanks.

## 2018-03-06 NOTE — Progress Notes (Signed)
Cardiology Office Note:    Date:  03/06/2018   ID:  ELNA RADOVICH, DOB [birth year], MRN [id number]  PCP:  Bretta Bang, MD  Cardiologist:  Mertie Moores, MD  Referring MD: Bretta Bang, MD   Chief Complaint  Patient presents with  . Shortness of Breath    History of Present Illness:    [name] [name] is a 62 y.o. female with a past medical history significant for Arthritis, hypothyroidism, migraines, palpitations-benign, HH, GERD, depression, COPD, anxiety.  The patient went to her PCP on 02/27/18 with complaints of extreme fatigue and dyspnea. She has been out of work for the previous 6 weeks for this. She was diagnosed with Lyme Disease in 2016. She was having symptoms of sinusitis, extreme fatigue and shortness of breath in February. She was tentatively diagnosed with [facility] Spotted fever. She has been treated with multiple cycles of antibiotics and steroids. She has been followed closely and referred to infectious disease who questions whether she had RMSF and felt that she has chronic fatigue syndrome. She has also been seen several times by pulmonology who ordered a sleep study. Troponin and BNP were normal. CT was negative for PE. She is also referred to a psychiatrist. She is followed by pulmonology for bronchiectasis. She has never smoked but she had exposure to second hand smoke growing up. ACE-I was switched to bystolic to see if improvement in cough, has had no effect. She declined starting a statin in December due to fear of worse body aches. Last LDL in Epic was 152 in 2016.    She has a long complicated story. She comes in today with significant shortness of breath walking in from the waiting room. She notes that she has gained 34 pounds since her last ppt with [place] in 10/2016. She feels that stress has contributed to her wt gain. Husband has colon cancer with mets to the lungs and liver. She is eating a lot of fast food.  She has chest tightness with shortness of  breath every morning while up walking around, letting dogs out and making breakfast. She also develops sever fatigue causing her to have to sit on the couch with improvement. She has tried taking ativan with no improvement. It resolves once she rests on the couch. She does not have any CP for the rest of the day although she is very activity intolerant due to shortness of breath, fatigue and muscle pain in the arms and upper legs. This has been occurring since early February. She denies orthopnea, PND, or edema. She has occ loss of balance, no dizziness, syncope. She is having low grade fever, chills and sweating easily.   She was evaluated by Dr. Acie Fredrickson in 2016 for palpitations and holter monitor and echo were normal. She was referred back to cardiology for leg edema in 10/2016 and seen by Richardson Dopp, PA. Her edema was suspected to be related to recently prescribed gabapentin (for anxiety). With her COPD an echo was done to assess her pulmonary pressures and RV function which were normal. She had normal EF and grade 1 DD. She had been started on lasix by her PCP with improvement in edema.                           Past Medical History:  Diagnosis Date  . Anxiety   . Bronchiectasis   . Bronchitis    chronic  . 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  COPD (chronic obstructive pulmonary disease) ([place])   . Dental crown present    upper  . Depression   . Environmental allergies    receives allergy shots  . GERD (gastroesophageal reflux disease)   . 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 - no current med.  . Glucose intolerance (impaired glucose tolerance)    on Metformin  . H/O hiatal hernia    "sliding"  . History of echocardiogram    Echo 1/16: EF 60-65, no RWMA, Gr 1 DD, mild LAE, trivial pericardial eff post to heart  . History of echocardiogram    Echo 11/17: EF 55-60, no RWMA, Gr 1 DD, normal RV function.  Marland Kitchen History of palpitations    Holter 12/15: NSR, PACs  . Hx of  migraines   . Hypothyroidism   . Osteoarthritis    knees  . Positional headache since 1997   if lies on left side    Past Surgical History:  Procedure Laterality Date  . CHOLECYSTECTOMY  04/21/2011  . CHONDROPLASTY  10/19/2012   Procedure: CHONDROPLASTY;  Surgeon: Yvette Rack., MD;  Location: [facility];  Service: Orthopedics;  Laterality: Right;  . COLONOSCOPY    . KNEE ARTHROSCOPY  10/19/2012   Procedure: ARTHROSCOPY KNEE;  Surgeon: Yvette Rack., MD;  Location: [place];  Service: Orthopedics;  Laterality: Right;  . KNEE ARTHROSCOPY Right 02/01/2013   Procedure: RIGHT KNEE ARTHROSCOPY WITH MEDIAL MENISCECTOMY, DEBRIDEMENT CHONDROMALACIA PATELLA;  Surgeon: Yvette Rack., MD;  Location: [facility];  Service: Orthopedics;  Laterality: Right;  RIGHT KNEE ARTHROSCOPY WITH MEDIAL MENISCECTOMY, DEBRIDEMENT CHONDROMALACIA PATELLA  . KNEE ARTHROSCOPY Left 03/15/2013   Procedure: LEFT KNEE ARTHROSCOPY WITH DEBRIDEMENT/SHAVING CHONDROPLASTY WITH MEDIAL MENISECTOMY;  Surgeon: Yvette Rack., MD;  Location: [facility];  Service: Orthopedics;  Laterality: Left;  . KNEE ARTHROSCOPY WITH LATERAL MENISECTOMY  10/19/2012   Procedure: KNEE ARTHROSCOPY WITH LATERAL MENISECTOMY;  Surgeon: Yvette Rack., MD;  Location: [place];  Service: Orthopedics;  Laterality: Right;  . TONSILLECTOMY AND ADENOIDECTOMY     age 63  . TUBAL LIGATION  1986  . WISDOM TOOTH EXTRACTION      Current Medications: Current Meds  Medication Sig  . albuterol (PROAIR HFA) 108 (90 BASE) MCG/ACT inhaler 2 puffs every 4 hours as needed only  if your can't catch your breath  . B Complex Vitamins (B-COMPLEX/B-12) LIQD Place 5 mLs under the tongue daily at 2 PM daily at 2 PM.  . celecoxib (CELEBREX) 200 MG capsule Take 200 mg by mouth daily as needed.   . Cholecalciferol (VITAMIN D) 2000 UNITS tablet Take 5,000 Units by mouth daily.   . fexofenadine  (ALLEGRA) 180 MG tablet Take 180 mg by mouth daily.  Marland Kitchen levothyroxine (SYNTHROID, LEVOTHROID) 25 MCG tablet Take 25 mcg by mouth daily before breakfast.  . liothyronine (CYTOMEL) 5 MCG tablet Take 5 mcg by mouth daily.  Marland Kitchen LORazepam (ATIVAN) 1 MG tablet Take 1 mg by mouth daily as needed.  . mometasone (NASONEX) 50 MCG/ACT nasal spray PLACE 2 SPRAYS INTO THE NOSE DAILY.  . montelukast (SINGULAIR) 10 MG tablet TAKE 1 TABLET BY MOUTH ONCE DAILY  . nebivolol (BYSTOLIC) 10 MG tablet One twice daily (Patient taking differently: Take 10 mg by mouth 2 (two) times daily. )  . Nebulizers (NEBULIZER COMPRESSOR) MISC Use as directed  . pantoprazole (PROTONIX) 40 MG tablet Take 1  tablet (40 mg total) by mouth 2 (two) times daily before a meal.  . Probiotic Product (PROBIOTIC DAILY) CAPS Take 1 capsule by mouth daily.  . sertraline (ZOLOFT) 100 MG tablet Take 200 mg by mouth 2 (two) times daily. 200 mg in morning, 100 mg at bedtime  . Travoprost, BAK Free, (TRAVATAN) 0.004 % SOLN ophthalmic solution Place 1 drop into both eyes at bedtime.  Marland Kitchen zolpidem (AMBIEN) 10 MG tablet Take 5 mg by mouth at bedtime.     Allergies:   Levaquin [levofloxacin] and Nickel   Social History   Socioeconomic History  . Marital status: Married    Spouse name: Not on file  . Number of children: Not on file  . Years of education: Not on file  . Highest education level: Not on file  Occupational History  . Occupation: Risk analyst: [employer]  . Financial resource strain: Not on file  . Food insecurity:    Worry: Not on file    Inability: Not on file  . Transportation needs:    Medical: Not on file    Non-medical: Not on file  Tobacco Use  . Smoking status: Never Smoker  . Smokeless tobacco: Never Used  Substance and Sexual Activity  . Alcohol use: Yes    Alcohol/week: 1.0 oz    Types: 2 Standard drinks or equivalent per week    Comment: Twice a month   . Drug use: No  . Sexual activity:  Yes    Partners: Male    Birth control/protection: Post-menopausal  Lifestyle  . Physical activity:    Days per week: Not on file    Minutes per session: Not on file  . Stress: Not on file  Relationships  . Social connections:    Talks on phone: Not on file    Gets together: Not on file    Attends religious service: Not on file    Active member of club or organization: Not on file    Attends meetings of clubs or organizations: Not on file    Relationship status: Not on file  Other Topics Concern  . Not on file  Social History Narrative  . Not on file     Family History: The patient's family history includes COPD (age of onset: 2) in her brother; Emphysema in her father; Hypertension in her sister; Lung cancer (age of onset: 3) in her father; Stroke in her mother; Thyroid disease in her sister. ROS:   Please see the history of present illness.     All other systems reviewed and are negative.  EKGs/Labs/Other Studies Reviewed:    The following studies were reviewed today:  Echocardiogram 11/09/2016 Left ventricle: The cavity size was normal. Wall thickness was   normal. Systolic function was normal. The estimated ejection   fraction was in the range of 55% to 60%. Wall motion was normal;   there were no regional wall motion abnormalities. Doppler   parameters are consistent with abnormal left ventricular   relaxation (grade 1 diastolic dysfunction).  Aorta Duplex 12/2014 Normal caliber abdominal aorta, common and external iliac arteries, without focal stenosis, or dilatation. Patent IVC.  Echo 12/2014 EF 60-65, no RWMA, Gr 1 DD, mild LAE, trivial pericardial eff post to heart  Holter 11/2014 NSR, PACs  Cardiopulmonary Stress Test 06/2012 Conclusion: Exercise testing with gas exchange demonstrates a normal functional capacity when compared to matched sedentary norms. There does not appear to be an  overt ventilatory limitation to the exercise. There appears to be  a significant limitation related to her body habitus.   Aorta Duplex 12/2014 Normal caliber abdominal aorta, common and external iliac arteries, without focal stenosis, or dilatation. Patent IVC.  Echo 12/2014 EF 60-65, no RWMA, Gr 1 DD, mild LAE, trivial pericardial eff post to heart  Holter 11/2014 NSR, PACs  Cardiopulmonary Stress Test 06/2012 Conclusion: Exercise testing with gas exchange demonstrates a normal functional capacity when compared to matched sedentary norms. There does not appear to be an overt ventilatory limitation to the exercise. There appears to be a significant limitation related to her body habitus.   EKG:  EKG is  ordered today.  The ekg ordered today demonstrates normal sinus rhythm at 62 bpm with no significant abnormalities.   Recent Labs: 01/24/2018: Pro B Natriuretic peptide (BNP) 23.0 01/31/2018: B Natriuretic Peptide 13.9; BUN 13; Creatinine, Ser 0.95; Hemoglobin 13.9; Platelets 392; Potassium 3.8; Sodium 139   Recent Lipid Panel    Component Value Date/Time   CHOL 247 (H) 02/05/2015 0850   TRIG 82 02/05/2015 0850   HDL 79 02/05/2015 0850   CHOLHDL 3.1 02/05/2015 0850   VLDL 16 02/05/2015 0850   LDLCALC 152 (H) 02/05/2015 0850    Physical Exam:    VS:  BP 130/88   Pulse 62   Ht 5\' 1"  (1.549 m)   Wt 227 lb 6.4 oz (103.1 kg)   LMP 12/12/2005   SpO2 97%   BMI 42.97 kg/m     Wt Readings from Last 3 Encounters:  03/06/18 227 lb 6.4 oz (103.1 kg)  02/27/18 228 lb (103.4 kg)  02/21/18 229 lb (103.9 kg)     Physical Exam  Constitutional: She is oriented to person, place, and time. She appears well-developed and well-nourished.  Obese female, very short of breath walking into exam room.   HENT:  Head: Normocephalic and atraumatic.  Neck: Normal range of motion. Neck supple. No JVD present.  Cardiovascular: Normal rate, regular rhythm, normal heart sounds and intact distal pulses. Exam reveals no gallop and no friction rub.  No murmur  heard. Pulmonary/Chest: She has no wheezes. She has no rales.  Increased work of breathing with exertion  Abdominal: Soft. Bowel sounds are normal.  Musculoskeletal: Normal range of motion. She exhibits edema.  Trace pretibial edema  Neurological: She is alert and oriented to person, place, and time.  Skin: Skin is warm and dry.  Psychiatric: She has a normal mood and affect. Her behavior is normal. Thought content normal.  Vitals reviewed.   ASSESSMENT:    1. Essential hypertension   2. Chest pain, unspecified type   3. SOB (shortness of breath)    PLAN:    In order of problems listed above:  Dyspnea on exertion: Pt with DOE since at least January. Has been worked up by PCP, infectious disease and pulmonology with no clear source. Negative EKG changes, troponins, PE, BNP. She also has daily chest tightness with shortness of breath in the mornings. Discussed doing R&L heart cath to evaluate for myocardial ischemia and pulmonary hypertension as possible sources of her symptoms. She is very reluctant to have an invasive procedure. She does agree to have coronary CTA and echocardiogram understanding that if they show abnormalities she may need a cath.   Hypertension: Well controlled on current medications.   Morbid obesity: Pt notes 20 pound wt gain that she relates to stress surrounding her husband's colon cancer with mets to lungs and  liver. She is eating a lot of fast food and cannot be active due to body aches and DOE. She really needs to lose at least 50-75 pounds in order to feel better and be able to move better. She says that she knows what she needs to do but does not seem to be ready to act.   Bronchiectasis: followed by pulmonology. SPO2 normal. Not on oxygen.   Medication Adjustments/Labs and Tests Ordered: Current medicines are reviewed at length with the patient today.  Concerns regarding medicines are outlined above. Labs and tests ordered and medication changes are  outlined in the patient instructions below:  Patient Instructions  Medication Instructions:  Your physician recommends that you continue on your current medications as directed. Please refer to the Current Medication list given to you today.  Labwork: You will need to have a BMET drawn prior to your Coronary CT.  Testing/Procedures: Your provider recommends that you have a Coronary CT performed.  Your physician has requested that you have an echocardiogram. Echocardiography is a painless test that uses sound waves to create images of your heart. It provides your doctor with information about the size and shape of your heart and how well your heart's chambers and valves are working. This procedure takes approximately one hour. There are no restrictions for this procedure.   Follow-Up: Your physician recommends that you schedule a follow-up appointment after testing is completed with Dr. Acie Fredrickson or APP.    Any Other Special Instructions Will Be Listed Below (If Applicable).  Please arrive at the [facility] main entrance of [facility] at 30-45 minutes prior to test start time.  [facility] [address] [place], [place] [address] [phone]  Proceed to the [facility] Radiology Department (First Floor).  Please follow these instructions carefully (unless otherwise directed):  Hold all erectile dysfunction medications at least 48 hours prior to test.  On the Night Before the Test: . Drink plenty of water. . Do not consume any caffeinated/decaffeinated beverages or chocolate 12 hours prior to your test. . Do not take any antihistamines 12 hours prior to your test. . If you take Metformin do not take 24 hours prior to test.  On the Day of the Test: . Drink plenty of water. Do not drink any water within one hour of the test. . Do not eat any food 4 hours prior to the test. . You may take your regular medications prior to the test. . HOLD Furosemide morning  of the test.  After the Test: . Drink plenty of water. . After receiving IV contrast, you may experience a mild flushed feeling. This is normal. . On occasion, you may experience a mild rash up to 24 hours after the test. This is not dangerous. If this occurs, you can take Benadryl 25 mg and increase your fluid intake. . If you experience trouble breathing, this can be serious. If it is severe call 911 IMMEDIATELY. If it is mild, please call our office. . If you take any of these medications: Glipizide/Metformin, Avandament, Glucavance, please do not take 48 hours after completing test.    If you need a refill on your cardiac medications before your next appointment, please call your pharmacy.      Signed, Daune Perch, NP  03/06/2018 5:49 PM    [facility]

## 2018-03-07 ENCOUNTER — Telehealth: Payer: Self-pay

## 2018-03-07 MED ORDER — PANTOPRAZOLE SODIUM 40 MG PO TBEC: 40.0000 mg | DELAYED_RELEASE_TABLET | Freq: Two times a day (BID) | ORAL | 5 refills | Status: DC

## 2018-03-07 MED FILL — PANTOPRAZOLE SOD DR 40 MG T: 40 | 30 days supply | Qty: 60 | Fill #0

## 2018-03-07 NOTE — Telephone Encounter (Signed)
Ria Comment re-faxed paperwork yesterday. Patient is aware. Nothing else needed at time of call.

## 2018-03-07 NOTE — Telephone Encounter (Signed)
Received Rx note from [facility] stating that pt takes Pantoprazole 40mg  bid.  [facility] is requesting new Rx. Per MW's last OV note pt was instructed to take Protonix 30- 60 min before your first and last meals of the day. Rx last sent in for Protonix sent on 12/15/14 was for 1 tablet BID.  MW please advise. Thanks  02/16/18 Instructions   Bystolic 10 mg one twice daily until your next visit   Protonix Take 30- 60 min before your first and last meals of the day    GERD (REFLUX)  is an extremely common cause of respiratory symptoms just like yours , many times with no obvious heartburn at all.    It can be treated with medication, but also with lifestyle changes including elevation of the head of your bed (ideally with 6 inch  bed blocks),  Smoking cessation, avoidance of late meals, excessive alcohol, and avoid fatty foods, chocolate, peppermint, colas, red wine, and acidic juices such as orange juice.  NO MINT OR MENTHOL PRODUCTS SO NO COUGH DROPS   USE SUGARLESS CANDY INSTEAD (Jolley ranchers or Stover's or Life Savers) or even ice chips will also do - the key is to swallow to prevent all throat clearing. NO OIL BASED VITAMINS - use powdered substitutes.  Stop spiriva   Only use your albuterol as a rescue medication to be used if you can't catch your breath by resting or doing a relaxed purse lip breathing pattern.  - The less you use it, the better it will work when you need it. - Ok to use up to 2 puffs  every 4 hours if you must but call for immediate appointment if use goes up over your usual need - Don't leave home without it !!  (think of it like the spare tire for your car)                         After Visit Summary (Printed 02/16/2018)

## 2018-03-07 NOTE — Telephone Encounter (Signed)
Ok to change quantity to #60 with sig Take 30- 60 min before your first and last meals of the day

## 2018-03-07 NOTE — Telephone Encounter (Signed)
rx sent to pharmacy as requested.  Nothing further needed.

## 2018-03-08 MED FILL — MOMETASONE FUROATE 50 MCG S: 50 | 30 days supply | Qty: 17 | Fill #3

## 2018-03-09 ENCOUNTER — Ambulatory Visit: Payer: 59 | Admitting: Adult Health

## 2018-03-09 ENCOUNTER — Encounter: Payer: Self-pay | Admitting: Adult Health

## 2018-03-09 ENCOUNTER — Other Ambulatory Visit (INDEPENDENT_AMBULATORY_CARE_PROVIDER_SITE_OTHER): Payer: 59

## 2018-03-09 VITALS — BP 126/72 | HR 75 | Ht 61.0 in | Wt 229.8 lb

## 2018-03-09 DIAGNOSIS — R5382 Chronic fatigue, unspecified: Secondary | ICD-10-CM | POA: Diagnosis not present

## 2018-03-09 DIAGNOSIS — J302 Other seasonal allergic rhinitis: Secondary | ICD-10-CM

## 2018-03-09 DIAGNOSIS — R0602 Shortness of breath: Secondary | ICD-10-CM | POA: Diagnosis not present

## 2018-03-09 DIAGNOSIS — J3089 Other allergic rhinitis: Secondary | ICD-10-CM

## 2018-03-09 LAB — CORTISOL: Cortisol, Plasma: 5.3 ug/dL

## 2018-03-09 LAB — CBC WITH DIFFERENTIAL/PLATELET
Basophils Absolute: 0 10*3/uL (ref 0.0–0.1)
Basophils Relative: 0.4 % (ref 0.0–3.0)
Eosinophils Absolute: 0.1 10*3/uL (ref 0.0–0.7)
Eosinophils Relative: 1.1 % (ref 0.0–5.0)
HCT: 37.5 % (ref 36.0–46.0)
Hemoglobin: 12.8 g/dL (ref 12.0–15.0)
Lymphocytes Relative: 27.2 % (ref 12.0–46.0)
Lymphs Abs: 2 10*3/uL (ref 0.7–4.0)
MCHC: 34.2 g/dL (ref 30.0–36.0)
MCV: 92.4 fl (ref 78.0–100.0)
Monocytes Absolute: 0.5 10*3/uL (ref 0.1–1.0)
Monocytes Relative: 7.2 % (ref 3.0–12.0)
Neutro Abs: 4.8 10*3/uL (ref 1.4–7.7)
Neutrophils Relative %: 64.1 % (ref 43.0–77.0)
Platelets: 321 10*3/uL (ref 150.0–400.0)
RBC: 4.06 Mil/uL (ref 3.87–5.11)
RDW: 13.7 % (ref 11.5–15.5)
WBC: 7.5 10*3/uL (ref 4.0–10.5)

## 2018-03-09 LAB — TSH: TSH: 1.17 u[IU]/mL (ref 0.35–4.50)

## 2018-03-09 NOTE — Assessment & Plan Note (Signed)
Unclear etiology.  Continue with cardiac evaluation. Does not appear to have an underlying asthma or COPD component.  Previous CT chest with some very mild groundglass opacities that will need follow-up in 6 months.  No active symptoms.  If not resolved can consider further testing as indicated

## 2018-03-09 NOTE — Assessment & Plan Note (Signed)
Continue on current regimen .   

## 2018-03-09 NOTE — Assessment & Plan Note (Signed)
Check cortisol, TSH and CBC Refer to rheumatology Check home sleep study

## 2018-03-09 NOTE — Progress Notes (Signed)
@Patient  ID: [name] [name], female    DOB: 25-Mar-1956, 62 y.o.   MRN: [id number]  Chief Complaint  Patient presents with  . 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 prior to study with DLCO 97/96c % corrects to 122 % for alv volume And ERV 10 01/31/18 CT Chest >neg PE , mild GG  01/2015 neg ANA , RA factor   03/09/2018 Follow up : Dyspnea and fatigue  Patient returns for a 2-week follow-up.  Patient was seen last visit for progressive shortness of breath and extreme fatigue over the last several months.  She is at the point where she can barely do any type of activity without wearing out from fatigue and dyspnea.  She says this is causing her to be very anxious.  She has been unable to work and has been on short-term disability.   She was recently seen by infectious disease for workup of a possible [facility] spotted fever exposure.  Lab work had showed a positive titer for [facility] spotted fever.  She was treated with doxycycline.  According to infectious disease notes it was felt this was adequate treatment if in fact this was an actual positive result. She says that she continues to have severe fatigue, joint aches.  Lab work is been unrevealing with a normal CBC, BNP, be met.  CT chest in February 2019 was negative for PE.  There was some mild groundglass opacities noted.  Pulmonary function test in February 2019 was normal with no airflow obstruction or restriction .  DLCO was normal. Patient has recently seen cardiology to undergo a workup to see if her dyspnea is cardiac related.  She has an upcoming echo and a cardiac CT. She does have daytime sleepiness.  A home sleep study is pending We had discussed earlier a cardiopulmonary stress test however she has joint issues and  unclear if she would be able to perform this. Walk test in the office showed no desaturations on room air with O2 saturations at 98-99%. She denies any cough or wheezing.  Patient does have some sinus drainage that she takes Nasonex and Singulair and Allegra which do seem to help.. Patient has seen psychiatry and felt that her depression and anxiety were under good control. She does request a referral to rheumatology for fatigue and joint pains. Previous ANA and RA factor and 2016 were negative.  Patient denies any joint swelling.        Allergies  Allergen Reactions  . Levaquin [Levofloxacin] Other (See Comments)    MUSCLE PAIN  . Nickel Rash    Immunization History  Administered Date(s) Administered  . Influenza Split 09/13/2011, 09/11/2012, 09/11/2013, 11/18/2014    Past Medical History:  Diagnosis Date  . Anxiety   . Bronchiectasis   . Bronchitis    chronic  . 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 COPD (chronic obstructive pulmonary disease) ([place])   . Dental crown present    upper  . Depression   . Environmental allergies    receives allergy shots  . GERD (gastroesophageal reflux disease)   . 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 - no current med.  . Glucose intolerance (impaired glucose tolerance)    on Metformin  .  H/O hiatal hernia    "sliding"  . History of echocardiogram    Echo 1/16: EF 60-65, no RWMA, Gr 1 DD, mild LAE, trivial pericardial eff post to heart  . History of echocardiogram    Echo 11/17: EF 55-60, no RWMA, Gr 1 DD, normal RV function.  Marland Kitchen History of palpitations    Holter 12/15: NSR, PACs  . Hx of migraines   . Hypothyroidism   . Osteoarthritis    knees  . Positional headache since 1997   if lies on left side    Tobacco History: Social History   Tobacco Use  Smoking Status Never Smoker  Smokeless Tobacco Never Used   Counseling given: Not Answered   Outpatient Encounter Medications as of 03/09/2018  Medication  Sig  . albuterol (PROAIR HFA) 108 (90 BASE) MCG/ACT inhaler 2 puffs every 4 hours as needed only  if your can't catch your breath  . B Complex Vitamins (B-COMPLEX/B-12) LIQD Place 5 mLs under the tongue daily at 2 PM daily at 2 PM.  . celecoxib (CELEBREX) 200 MG capsule Take 200 mg by mouth daily as needed.   . Cholecalciferol (VITAMIN D) 2000 UNITS tablet Take 5,000 Units by mouth daily.   . fexofenadine (ALLEGRA) 180 MG tablet Take 180 mg by mouth daily.  Marland Kitchen levothyroxine (SYNTHROID, LEVOTHROID) 25 MCG tablet Take 25 mcg by mouth daily before breakfast.  . liothyronine (CYTOMEL) 5 MCG tablet Take 5 mcg by mouth daily.  Marland Kitchen LORazepam (ATIVAN) 1 MG tablet Take 1 mg by mouth daily as needed.  . mometasone (NASONEX) 50 MCG/ACT nasal spray PLACE 2 SPRAYS INTO THE NOSE DAILY.  . montelukast (SINGULAIR) 10 MG tablet TAKE 1 TABLET BY MOUTH ONCE DAILY  . nebivolol (BYSTOLIC) 10 MG tablet One twice daily (Patient taking differently: Take 10 mg by mouth 2 (two) times daily. )  . Nebulizers (NEBULIZER COMPRESSOR) MISC Use as directed  . pantoprazole (PROTONIX) 40 MG tablet Take 1 tablet (40 mg total) by mouth 2 (two) times daily before a meal.  . Probiotic Product (PROBIOTIC DAILY) CAPS Take 1 capsule by mouth daily.  . sertraline (ZOLOFT) 100 MG tablet Take 200 mg by mouth 2 (two) times daily. 200 mg in morning, 100 mg at bedtime  . Travoprost, BAK Free, (TRAVATAN) 0.004 % SOLN ophthalmic solution Place 1 drop into both eyes at bedtime.  Marland Kitchen zolpidem (AMBIEN) 10 MG tablet Take 5 mg by mouth at bedtime.   No facility-administered encounter medications on file as of 03/09/2018.      Review of Systems  Constitutional:   No  weight loss, night sweats,  Fevers, chills,  +fatigue, or  lassitude.  HEENT:   No headaches,  Difficulty swallowing,  Tooth/dental problems, or  Sore throat,                No sneezing, itching, ear ache, nasal congestion, post nasal drip,   CV:  No chest pain,  Orthopnea, PND,  swelling in lower extremities, anasarca, dizziness, palpitations, syncope.   GI  No heartburn, indigestion, abdominal pain, nausea, vomiting, diarrhea, change in bowel habits, loss of appetite, bloody stools.   Resp:  t.  No excess mucus, no productive cough,  No non-productive cough,  No coughing up of blood.  No change in color of mucus.  No wheezing.  No chest wall deformity  Skin: no rash or lesions.  GU: no dysuria, change in color of urine, no urgency or frequency.  No flank pain, no hematuria  MS:  + joint pain no joint swelling.  No decreased range of motion.  No back pain.    Physical Exam  BP 126/72 (BP Location: Left Arm, Cuff Size: Normal)   Pulse 75   Ht 5' 1"  (1.549 m)   Wt 229 lb 12.8 oz (104.2 kg)   LMP 12/12/2005   SpO2 97%   BMI 43.42 kg/m   GEN: A/Ox3; pleasant , NAD, obese, anxious   HEENT:  [place]/AT,  EACs-clear, TMs-wnl, NOSE-clear, THROAT-clear, no lesions, no postnasal drip or exudate noted.   NECK:  Supple w/ fair ROM; no JVD; normal carotid impulses w/o bruits; no thyromegaly or nodules palpated; no lymphadenopathy.    RESP  Clear  P & A; w/o, wheezes/ rales/ or rhonchi. no accessory muscle use, no dullness to percussion  CARD:  RRR, no m/r/g, no peripheral edema, pulses intact, no cyanosis or clubbing.  GI:   Soft & nt; nml bowel sounds; no organomegaly or masses detected.   Musco: Warm bil, no deformities or joint swelling noted.   Neuro: alert, no focal deficits noted.    Skin: Warm, no lesions or rashes    Lab Results:  ProBNP  Imaging: No results found.   Assessment & Plan:   Chronic fatigue Check cortisol, TSH and CBC Refer to rheumatology Check home sleep study  Dyspnea Unclear etiology.  Continue with cardiac evaluation. Does not appear to have an underlying asthma or COPD component.  Previous CT chest with some very mild groundglass opacities that will need follow-up in 6 months.  No active symptoms.  If not resolved can  consider further testing as indicated  Seasonal and perennial allergic rhinitis Continue on current regimen     Rexene Edison, NP 03/09/2018

## 2018-03-09 NOTE — Patient Instructions (Addendum)
Home sleep study when available  Labs today .  Mucinex DM Twice daily  As needed  Cough/congestion .  Saline nasal rinses and gel As needed   Fluids and rest  Follow up with Dr. Annamaria Boots  3 months and as needed

## 2018-03-12 ENCOUNTER — Other Ambulatory Visit (HOSPITAL_COMMUNITY): Payer: Self-pay

## 2018-03-14 ENCOUNTER — Ambulatory Visit (HOSPITAL_COMMUNITY): Payer: 59 | Attending: Cardiovascular Disease

## 2018-03-14 ENCOUNTER — Other Ambulatory Visit: Payer: Self-pay

## 2018-03-14 DIAGNOSIS — J449 Chronic obstructive pulmonary disease, unspecified: Secondary | ICD-10-CM | POA: Diagnosis not present

## 2018-03-14 DIAGNOSIS — G4733 Obstructive sleep apnea (adult) (pediatric): Secondary | ICD-10-CM | POA: Diagnosis not present

## 2018-03-14 DIAGNOSIS — R0602 Shortness of breath: Secondary | ICD-10-CM | POA: Diagnosis not present

## 2018-03-15 DIAGNOSIS — G4733 Obstructive sleep apnea (adult) (pediatric): Secondary | ICD-10-CM | POA: Diagnosis not present

## 2018-03-16 ENCOUNTER — Ambulatory Visit (HOSPITAL_COMMUNITY)
Admission: RE | Admit: 2018-03-16 | Discharge: 2018-03-16 | Disposition: A | Discharge status: Home / Self Care | Payer: 59 | Source: Ambulatory Visit | Attending: Cardiology | Admitting: Cardiology

## 2018-03-16 ENCOUNTER — Ambulatory Visit (HOSPITAL_COMMUNITY): Payer: 59

## 2018-03-16 ENCOUNTER — Other Ambulatory Visit: Payer: Self-pay | Admitting: *Deleted

## 2018-03-16 DIAGNOSIS — R0602 Shortness of breath: Secondary | ICD-10-CM | POA: Diagnosis not present

## 2018-03-16 DIAGNOSIS — R079 Chest pain, unspecified: Secondary | ICD-10-CM | POA: Insufficient documentation

## 2018-03-16 DIAGNOSIS — R400 Somnolence: Secondary | ICD-10-CM

## 2018-03-16 MED ORDER — IOPAMIDOL (ISOVUE-370) INJECTION 76%
INTRAVENOUS | Status: AC
  Administered 2018-03-16: 80 mL
  Filled 2018-03-16: qty 100

## 2018-03-16 MED ORDER — NITROGLYCERIN 0.4 MG SL SUBL
SUBLINGUAL_TABLET | SUBLINGUAL | Status: AC
  Filled 2018-03-16: qty 2

## 2018-03-16 MED ORDER — NITROGLYCERIN 0.4 MG SL SUBL
0.8000 mg | SUBLINGUAL_TABLET | SUBLINGUAL | Status: DC | PRN
  Administered 2018-03-16: 0.8 mg via SUBLINGUAL

## 2018-03-19 MED FILL — LORazepam 1 MG TABS: 1 | 25 days supply | Qty: 75 | Fill #0

## 2018-03-20 MED FILL — ZOLPIDEM TARTRATE 10 MG TAB: 10 | 30 days supply | Qty: 30 | Fill #0

## 2018-03-21 ENCOUNTER — Telehealth: Payer: Self-pay

## 2018-03-21 ENCOUNTER — Encounter: Payer: Self-pay | Admitting: Cardiology

## 2018-03-21 DIAGNOSIS — R5382 Chronic fatigue, unspecified: Secondary | ICD-10-CM

## 2018-03-21 DIAGNOSIS — R232 Flushing: Secondary | ICD-10-CM | POA: Diagnosis not present

## 2018-03-21 NOTE — Telephone Encounter (Signed)
Spoke with the pt  She states that at her last visit here with TP she was supposed to be referred to rheum She called Dr Melissa Noon office and was advised that no records have been sent  She states that she wants referral to be made now  CDY- is this okay? She kept saying that the referral was going to be made for fibromyalgia, but she does not think she has this and believes she has chronic fatigue  Please advise thanks!

## 2018-03-21 NOTE — Telephone Encounter (Signed)
I passed this on to TP

## 2018-03-22 NOTE — Telephone Encounter (Signed)
Address at ov

## 2018-03-23 ENCOUNTER — Encounter: Payer: Self-pay | Admitting: Internal Medicine

## 2018-03-23 ENCOUNTER — Ambulatory Visit: Payer: 59 | Admitting: Internal Medicine

## 2018-03-23 ENCOUNTER — Other Ambulatory Visit (INDEPENDENT_AMBULATORY_CARE_PROVIDER_SITE_OTHER): Payer: 59

## 2018-03-23 VITALS — BP 138/76 | HR 63 | Temp 100.8°F | Ht 61.0 in | Wt 230.0 lb

## 2018-03-23 DIAGNOSIS — E039 Hypothyroidism, unspecified: Secondary | ICD-10-CM | POA: Diagnosis not present

## 2018-03-23 DIAGNOSIS — G4733 Obstructive sleep apnea (adult) (pediatric): Secondary | ICD-10-CM | POA: Diagnosis not present

## 2018-03-23 DIAGNOSIS — E279 Disorder of adrenal gland, unspecified: Secondary | ICD-10-CM | POA: Diagnosis not present

## 2018-03-23 DIAGNOSIS — R509 Fever, unspecified: Secondary | ICD-10-CM

## 2018-03-23 LAB — CBC WITH DIFFERENTIAL/PLATELET
Basophils Absolute: 0.1 10*3/uL (ref 0.0–0.1)
Basophils Relative: 0.5 % (ref 0.0–3.0)
Eosinophils Absolute: 0.1 10*3/uL (ref 0.0–0.7)
Eosinophils Relative: 0.6 % (ref 0.0–5.0)
HCT: 40 % (ref 36.0–46.0)
Hemoglobin: 13.5 g/dL (ref 12.0–15.0)
Lymphocytes Relative: 24.5 % (ref 12.0–46.0)
Lymphs Abs: 2.5 10*3/uL (ref 0.7–4.0)
MCHC: 33.9 g/dL (ref 30.0–36.0)
MCV: 92.6 fl (ref 78.0–100.0)
Monocytes Absolute: 0.6 10*3/uL (ref 0.1–1.0)
Monocytes Relative: 6.1 % (ref 3.0–12.0)
Neutro Abs: 7 10*3/uL (ref 1.4–7.7)
Neutrophils Relative %: 68.3 % (ref 43.0–77.0)
Platelets: 406 10*3/uL — ABNORMAL HIGH (ref 150.0–400.0)
RBC: 4.32 Mil/uL (ref 3.87–5.11)
RDW: 13.9 % (ref 11.5–15.5)
WBC: 10.2 10*3/uL (ref 4.0–10.5)

## 2018-03-23 NOTE — Patient Instructions (Addendum)
Order- new DME, new CPAP auto 5-20, mask of choice, humidifier, supplies, AirView    Dx OSA  Order- referral to orthodontist Dr Oneal Grout    Consider oral appliance for OSA  Please call as needed  Order lab CBC w diff     Dx Fever   Order- referral to Dr Cruzita Lederer, endocrine, dx  Hypothyroid,  Diaphoresis  May return to work Monday March 26, 2018

## 2018-03-23 NOTE — Assessment & Plan Note (Signed)
Moderate obstructive sleep apnea in this range could contribute substantially to sense of daytime tiredness. Plan-sleep hygiene discussed.  Compared options CPAP versus oral appliance while working on weight.  We will start CPAP auto 5-20 but also refer so that she can learn about oral appliance option.

## 2018-03-23 NOTE — Progress Notes (Signed)
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 Allergy skin test- 01/14/15-positive for mixed grass pollens, fall weeds, several tree pollens, dust mite, feather, cat and some molds. PFT's 02/02/18 - no airflow obstruction FEV1 2.16 (94 % ) ratio 92 p no % improvement from saba p ? 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 Acute onset 2 weeks ago sore throat, headache.  Urgent care treated with Augmentin.  Then chest began congestion.  No myalgias.  Some cold sweats.  Last week treated at [facility] with Biaxin, prednisone 10 mg daily for 7 days.  Spiriva 1.25 mg.  Now complains breathless.  Using nebulizer.  Dry hacking cough without wheeze.  Some GERD.  Much yellow nasal discharge-using Nettie pot.  No headache.  Had recently fallen in parking lot and was given prednisone taper by orthopedics, ending  a week or more before current illness. Admits significant stress.  Husband with lung cancer found to 3 weeks ago to have liver metastasis. Declines flu shot-"reaction" years ago.  Rapid flu swab today negative.  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 CT chest for follow-up in September-groundglass  Echocardiogram 03/14/18-normal Singulair, Nasonex, pro-air HFA, Allegra, HST -03/14/18-moderate obstructive apnea-AHI 16.7/hour, desaturation to 72%, body weight 229 pounds/BMI 43.3 She admits "a little better", walking to mailbox daily but says sometimes she has to hurry to sofa to sit down because she is so tired.  She describes her work as physically stressful because she is on her feet most of the day and leaning over beds.  I recommended return to work. On arrival temp recorded 100.8 today with pulse rate 63.  She describes hot flashes and sweats.  Asks again for referral to endocrinology for thyroid/hormonal assessment.  ROS-see HPI + = positive Constitutional:   No-   weight loss, night sweats, +fevers, chills,+ fatigue, lassitude. HEENT:   + headaches, difficulty swallowing, tooth/dental problems, sore throat,       No-  sneezing, itching, ear ache, nasal congestion, +post nasal drip,  CV:  No-   chest pain, orthopnea, PND, swelling in lower extremities, anasarca,                                  dizziness, palpitations Resp: + shortness of breath with exertion or at rest.              +   productive  cough,  No non-productive cough,  No- coughing up of blood.               change in color of mucus.  No- wheezing.   Skin: No-   rash or lesions. GI:  + heartburn, indigestion, abdominal pain, nausea, vomiting,  GU:  MS:  + joint  pain or swelling.   Neuro-     nothing unusual Psych:  No- change in mood or affect. + depression or +anxiety.  No memory loss.    Objective:  OBJ- Physical Exam  . + Voice intermittently tremulous/stress pattern General- Alert, Oriented, Affect-appropriate, Distress- none acute + obese Skin- rash-none, lesions- none, excoriation- none Lymphadenopathy- none Head- atraumatic            Eyes- Gross vision intact, PERRLA, conjunctivae and secretions clear            Ears- Hearing, canals-normal            Nose- Clear, no-Septal dev, mucus, polyps, erosion, perforation             Throat- Mallampati III , mucosa clear , drainage- none, tonsils- atrophic Neck- flexible , trachea midline, no stridor , thyroid nl, carotid no bruit Chest - symmetrical excursion , unlabored           Heart/CV- RRR , no murmur , no gallop  , no rub, nl s1 s2                           - JVD- none , edema- none, stasis changes- none, varices- none           Lung- clear to P&A, wheeze- none, cough- none , dullness-none, rub- none + seemed                      breathless on arrival, during talking. Shallow c/w body habitus           Chest wall-  Abd-  Br/ Gen/ Rectal- Not done, not indicated Extrem- cyanosis- none, clubbing, none, atrophy- none, strength- nl Neuro- grossly intact to observation     Assessment & Plan:

## 2018-03-23 NOTE — Assessment & Plan Note (Signed)
She asked for referral to endocrinology as she tries to sort out if she has a hormone disturbance, adrenal or thyroid problem as previously recorded. Plan-endocrinology referral.

## 2018-03-23 NOTE — Assessment & Plan Note (Signed)
Temperature recorded 100.8 today might be within her normal range with exertion of coming in for this appointment, especially since she is not tachycardic.  She complains of episodes of heat/flushing/diaphoresis and has been seen by Infectious Disease with no specific finding. Plan-CBC with differential

## 2018-03-23 NOTE — Assessment & Plan Note (Signed)
This is a significant problem.  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  She needs to establish a primary care physician for guidance and possibly referral for bariatric evaluation.

## 2018-03-26 ENCOUNTER — Telehealth: Payer: Self-pay | Admitting: Cardiovascular Disease

## 2018-03-26 NOTE — Telephone Encounter (Signed)
Returned call to patient who states she received results from our office that her tests were did not show any indication for SOB. She cancelled her appointment with Pecolia Ades, NP for tomorrow due to she is returning to work. She asks if she should schedule something for a different day with Dr. Acie Fredrickson. I advised that she will need to have her cholesterol managed based on findings from CT and she admits that her LDL has been very high but she is not currently on medication. She agrees to plan to see Dr. Acie Fredrickson in 3 months for management of cholesterol. I advised her to call back prior to her appointment with additional questions or concerns. She thanked me for the call.

## 2018-03-26 NOTE — Telephone Encounter (Signed)
New message:     Pt states she doesn't believe she needs this appt scheduled for tomorrow so she has cancelled it but wants to know if she needs to keep it states pt.

## 2018-03-27 ENCOUNTER — Ambulatory Visit: Payer: Self-pay | Admitting: Cardiology

## 2018-03-28 MED FILL — BYSTOLIC 10 MG TABLET: 10 | 90 days supply | Qty: 180 | Fill #0

## 2018-03-28 MED FILL — SERTRALINE HCL 100 MG TAB: 100 | 30 days supply | Qty: 90 | Fill #0

## 2018-03-29 ENCOUNTER — Ambulatory Visit: Payer: Self-pay

## 2018-03-29 MED FILL — traMADol HCL 50 MG TABS: 50 | 5 days supply | Qty: 40 | Fill #2

## 2018-03-29 MED FILL — SHIPPING COST: 1 days supply | Qty: 1 | Fill #0

## 2018-04-04 ENCOUNTER — Telehealth: Payer: Self-pay | Admitting: Internal Medicine

## 2018-04-04 NOTE — Telephone Encounter (Signed)
I have called Mrs. [employer] and found out that [employer] [facility] is the only DME in network for [employer]. Mrs. Lape has met her $300.00 deductible and will only have to pay her 20% for the CPAP machine and supplies. I have now spoken with Mrs. Gutt and explained what I found out from the insurance company and she will be keeping her appt with [facility] for Friday 04/06/2018 to pick up machine

## 2018-04-06 ENCOUNTER — Encounter: Payer: Self-pay | Admitting: *Deleted

## 2018-04-06 DIAGNOSIS — G4733 Obstructive sleep apnea (adult) (pediatric): Secondary | ICD-10-CM | POA: Diagnosis not present

## 2018-04-06 NOTE — Telephone Encounter (Signed)
Per TP's last office note on 3.29.19 referral to Rheumatology was indicated So sorry this was not done Referral placed now Pt notified via [place] message  Nothing further needed at this time; will sign off  Per 3.29.19 office visit with TP: Chronic fatigue - Parrett, Fonnie Mu, NP at 03/09/2018 11:49 AM  Status: Written  Related Problem: Chronic fatigue   Check cortisol, TSH and CBC Refer to rheumatology Check home sleep study

## 2018-04-18 MED FILL — MOMETASONE FUROATE 50 MCG S: 50 | 30 days supply | Qty: 17 | Fill #4

## 2018-04-18 MED FILL — LORazepam 1 MG TABS: 1 | 25 days supply | Qty: 75 | Fill #1

## 2018-04-18 MED FILL — PANTOPRAZOLE SOD DR 40 MG T: 40 | 30 days supply | Qty: 60 | Fill #1

## 2018-04-18 MED FILL — ZOLPIDEM TARTRATE 10 MG TAB: 10 | 30 days supply | Qty: 30 | Fill #1

## 2018-04-25 DIAGNOSIS — Z1212 Encounter for screening for malignant neoplasm of rectum: Secondary | ICD-10-CM | POA: Diagnosis not present

## 2018-04-25 DIAGNOSIS — R319 Hematuria, unspecified: Secondary | ICD-10-CM | POA: Diagnosis not present

## 2018-04-25 DIAGNOSIS — Z01419 Encounter for gynecological examination (general) (routine) without abnormal findings: Secondary | ICD-10-CM | POA: Diagnosis not present

## 2018-04-25 DIAGNOSIS — Z1151 Encounter for screening for human papillomavirus (HPV): Secondary | ICD-10-CM | POA: Diagnosis not present

## 2018-04-25 MED FILL — NORETHIN-ETH ESTRAD 1 MG-5: 1-5 | 84 days supply | Qty: 84 | Fill #0

## 2018-04-26 DIAGNOSIS — M1990 Unspecified osteoarthritis, unspecified site: Secondary | ICD-10-CM | POA: Diagnosis not present

## 2018-04-26 DIAGNOSIS — M791 Myalgia, unspecified site: Secondary | ICD-10-CM | POA: Diagnosis not present

## 2018-04-26 DIAGNOSIS — M2550 Pain in unspecified joint: Secondary | ICD-10-CM | POA: Diagnosis not present

## 2018-04-26 DIAGNOSIS — F329 Major depressive disorder, single episode, unspecified: Secondary | ICD-10-CM | POA: Diagnosis not present

## 2018-04-26 DIAGNOSIS — E669 Obesity, unspecified: Secondary | ICD-10-CM | POA: Diagnosis not present

## 2018-04-26 DIAGNOSIS — R5383 Other fatigue: Secondary | ICD-10-CM | POA: Diagnosis not present

## 2018-04-30 MED FILL — MONTELUKAST SOD 10 MG TAB: 10 | 90 days supply | Qty: 90 | Fill #0

## 2018-05-04 DIAGNOSIS — H40053 Ocular hypertension, bilateral: Secondary | ICD-10-CM | POA: Diagnosis not present

## 2018-05-04 MED FILL — TRAVATAN Z 0.004% EYE DROP: 0.004 | 30 days supply | Qty: 3 | Fill #0

## 2018-05-06 DIAGNOSIS — G4733 Obstructive sleep apnea (adult) (pediatric): Secondary | ICD-10-CM | POA: Diagnosis not present

## 2018-05-08 DIAGNOSIS — L74519 Primary focal hyperhidrosis, unspecified: Secondary | ICD-10-CM | POA: Diagnosis not present

## 2018-05-08 DIAGNOSIS — R5382 Chronic fatigue, unspecified: Secondary | ICD-10-CM | POA: Diagnosis not present

## 2018-05-08 DIAGNOSIS — R635 Abnormal weight gain: Secondary | ICD-10-CM | POA: Diagnosis not present

## 2018-05-08 DIAGNOSIS — R946 Abnormal results of thyroid function studies: Secondary | ICD-10-CM | POA: Diagnosis not present

## 2018-05-08 MED FILL — CELECOXIB 200 MG CAPSULE: 200 | 30 days supply | Qty: 30 | Fill #1

## 2018-05-08 MED FILL — SERTRALINE HCL 100 MG TAB: 100 | 30 days supply | Qty: 90 | Fill #1

## 2018-05-16 MED FILL — ZOLPIDEM TARTRATE 10 MG TAB: 10 | 30 days supply | Qty: 30 | Fill #2

## 2018-05-23 ENCOUNTER — Encounter: Payer: Self-pay | Admitting: Internal Medicine

## 2018-05-24 ENCOUNTER — Encounter: Payer: Self-pay | Admitting: Internal Medicine

## 2018-05-24 ENCOUNTER — Ambulatory Visit: Payer: 59 | Admitting: Internal Medicine

## 2018-05-24 VITALS — BP 124/76 | HR 74 | Ht 61.0 in | Wt 236.0 lb

## 2018-05-24 DIAGNOSIS — G4733 Obstructive sleep apnea (adult) (pediatric): Secondary | ICD-10-CM | POA: Diagnosis not present

## 2018-05-24 NOTE — Patient Instructions (Signed)
Order- refer to [facility] daytime tech for CPAP mask fitting  Order- DME [facility]    Please change auto range to 8-15, continue CPAP  mask of choice humidifier,  AirView  Please call if we can help

## 2018-05-24 NOTE — Progress Notes (Signed)
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 Allergy skin test- 01/14/15-positive for mixed grass pollens, fall weeds, several tree pollens, dust mite, feather, cat and some molds. PFT's 02/02/18 - no airflow obstruction FEV1 2.16 (94 % ) ratio 92 p no % improvement from saba p ? 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 CT chest for follow-up in September-groundglass  Echocardiogram 03/14/18-normal Singulair, Nasonex, pro-air HFA, Allegra, HST -03/14/18-moderate obstructive apnea-AHI 16.7/hour, desaturation to 72%, body weight 229 pounds/BMI 43.3 She admits "a little better", walking to mailbox daily but says sometimes she has to hurry to sofa to sit down because she is so tired.  She describes her work as physically stressful because she is on her feet most of the day and leaning over beds.  I recommended return to work. On arrival temp recorded 100.8 today with pulse rate 63.  She describes hot flashes and sweats.  Asks again for referral to  endocrinology for thyroid/hormonal assessment.  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 CT chest for follow-up in September-groundglass  ----OSA. Pt states the CPAP headgear is uncomfortable- put too much pressure on her cheeks. She states she wakes up many times in the night due to facial itching.  Proair, singulair,  CPAP auto 5-20/ [facility] Download 90% compliance, AHI 0.9/ hr Breathing ok- rare need for rescue inhaler w no wheeze. DOE appropriate for level of exertion and fitness.   ROS-see HPI + = positive Constitutional:   No-   weight loss, night sweats, +fevers, chills,+ fatigue, lassitude. HEENT:   + headaches, difficulty swallowing, tooth/dental problems, sore throat,       No-  sneezing, itching, ear ache, nasal congestion, +post nasal drip,  CV:  No-   chest pain, orthopnea, PND, swelling in lower extremities, anasarca,                                  dizziness, palpitations Resp: + shortness of breath with exertion or at rest.              +   productive cough,  No non-productive cough,  No- coughing up of blood.               change in color of mucus.  No- wheezing.   Skin: No-   rash or lesions. GI:  + heartburn, indigestion, abdominal pain, nausea, vomiting,  GU:  MS:  + joint pain or swelling.   Neuro-     nothing unusual Psych:  No- change in mood or affect. + depression or +anxiety.  No memory loss.    Objective:  OBJ- Physical Exam  . + Voice intermittently tremulous/stress pattern General- Alert, Oriented, Affect-appropriate, Distress- none acute + obese Skin- rash-none, lesions- none, excoriation- none Lymphadenopathy- none Head- atraumatic            Eyes- Gross vision intact, PERRLA, conjunctivae  and secretions clear            Ears- Hearing, canals-normal            Nose- Clear, no-Septal dev, mucus, polyps, erosion, perforation             Throat- Mallampati III , mucosa clear , drainage- none, tonsils- atrophic Neck- flexible , trachea midline, no stridor , thyroid nl, carotid no bruit Chest - symmetrical excursion , unlabored           Heart/CV- RRR , no murmur , no gallop  , no rub, nl s1 s2                           - JVD- none , edema- none, stasis changes- none, varices- none           Lung- clear to P&A, wheeze- none, cough- none , dullness-none, rub- none .                          +Shallow c/w body habitus           Chest wall-  Abd-  Br/ Gen/ Rectal- Not done, not indicated Extrem- cyanosis- none, clubbing, none, atrophy- none, strength- nl Neuro- grossly intact to observation     Assessment & Plan:

## 2018-05-28 DIAGNOSIS — F4310 Post-traumatic stress disorder, unspecified: Secondary | ICD-10-CM | POA: Diagnosis not present

## 2018-06-04 ENCOUNTER — Ambulatory Visit (HOSPITAL_BASED_OUTPATIENT_CLINIC_OR_DEPARTMENT_OTHER): Payer: 59 | Attending: Internal Medicine | Admitting: Radiology

## 2018-06-04 DIAGNOSIS — G4733 Obstructive sleep apnea (adult) (pediatric): Secondary | ICD-10-CM

## 2018-06-05 NOTE — Assessment & Plan Note (Addendum)
She benefits from CPAP with better sleep. Download confirms good compliance and control. Plan- continue auto 5-20, refer for mask fitting

## 2018-06-05 NOTE — Assessment & Plan Note (Signed)
Body habitus suggests OHS may contribute to dyspnea and respiratory complaints. Weight loss goal encouraged.

## 2018-06-06 DIAGNOSIS — G4733 Obstructive sleep apnea (adult) (pediatric): Secondary | ICD-10-CM | POA: Diagnosis not present

## 2018-06-08 MED FILL — PANTOPRAZOLE SOD DR 40 MG T: 40 | 30 days supply | Qty: 60 | Fill #2

## 2018-06-13 ENCOUNTER — Encounter: Payer: Self-pay | Admitting: Cardiovascular Disease

## 2018-06-13 ENCOUNTER — Ambulatory Visit: Payer: 59 | Admitting: Cardiovascular Disease

## 2018-06-13 VITALS — BP 144/78 | HR 63 | Ht 61.0 in | Wt 238.0 lb

## 2018-06-13 DIAGNOSIS — R0602 Shortness of breath: Secondary | ICD-10-CM | POA: Diagnosis not present

## 2018-06-13 MED FILL — ZOLPIDEM TARTRATE 10 MG TAB: 10 | 30 days supply | Qty: 30 | Fill #0

## 2018-06-13 NOTE — Progress Notes (Signed)
Cardiology Office Note:    Date:  06/13/2018   ID:  [name] [name], DOB 1956-02-11, MRN [id number]  PCP:  Bretta Bang, MD  Cardiologist:  Mertie Moores, MD   Referring MD: Bretta Bang, MD   Chief Complaint  Patient presents with  . Hypertension     History of Present Illness:    [name] [name] is a 62 y.o. female with a hx of dyspnea.   I seen her several years ago for palpitations.  She is also been found to have a palpable abdominal aorta.  CT scan of the aortic did not reveal any significant abnormalities.  She saw Gae Bon in the office in March, 2019.  She was having increasing shortness of breath. She has since improved.   Has gained lots of weight  Wt.  In 2016 was 152 lbs Wt . Today is 238    Echo shows normal LV function.   EF 79-89% Normal diastolic dysfunction  Coronary CT angio reveal mild CAD with coronary calcium score of 0.7 Agatston units.   Has been sweating quite a bit .  GYN hormones are all normal  Sees Clint Young - has been diagnosed with COPD  Possibly of slight asthma   She has not been able to do any exercise because she sits with her husband who has stage IV colon cancer. She sits with him all day .   Eats fast food all day  Lives in [place], [place] Commutes 1 hr 20 min daily to work .  Has a great support system .   Past Medical History:  Diagnosis Date  . Anxiety   . Bronchiectasis   . Bronchitis    chronic  . 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 COPD (chronic obstructive pulmonary disease) ([place])   . Dental crown present    upper  . Depression   . Environmental allergies    receives allergy shots  . GERD (gastroesophageal reflux disease)   . 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 - no current med.  . Glucose intolerance (impaired glucose tolerance)    on Metformin  . H/O hiatal hernia    "sliding"  . History of echocardiogram    Echo 1/16: EF 60-65, no RWMA, Gr 1 DD, mild LAE, trivial pericardial eff post  to heart  . History of echocardiogram    Echo 11/17: EF 55-60, no RWMA, Gr 1 DD, normal RV function.  Marland Kitchen History of palpitations    Holter 12/15: NSR, PACs  . Hx of migraines   . Hypothyroidism   . Osteoarthritis    knees  . Positional headache since 1997   if lies on left side    Past Surgical History:  Procedure Laterality Date  . CHOLECYSTECTOMY  04/21/2011  . CHONDROPLASTY  10/19/2012   Procedure: CHONDROPLASTY;  Surgeon: Yvette Rack., MD;  Location: [facility];  Service: Orthopedics;  Laterality: Right;  . COLONOSCOPY    . KNEE ARTHROSCOPY  10/19/2012   Procedure: ARTHROSCOPY KNEE;  Surgeon: Yvette Rack., MD;  Location: [facility];  Service: Orthopedics;  Laterality: Right;  . KNEE ARTHROSCOPY Right 02/01/2013   Procedure: RIGHT KNEE ARTHROSCOPY WITH MEDIAL MENISCECTOMY, DEBRIDEMENT CHONDROMALACIA PATELLA;  Surgeon: Yvette Rack., MD;  Location: [facility];  Service: Orthopedics;  Laterality: Right;  RIGHT KNEE ARTHROSCOPY WITH MEDIAL MENISCECTOMY, DEBRIDEMENT CHONDROMALACIA PATELLA  . KNEE ARTHROSCOPY Left 03/15/2013   Procedure: LEFT KNEE ARTHROSCOPY  WITH DEBRIDEMENT/SHAVING CHONDROPLASTY WITH MEDIAL MENISECTOMY;  Surgeon: Yvette Rack., MD;  Location: [facility];  Service: Orthopedics;  Laterality: Left;  . KNEE ARTHROSCOPY WITH LATERAL MENISECTOMY  10/19/2012   Procedure: KNEE ARTHROSCOPY WITH LATERAL MENISECTOMY;  Surgeon: Yvette Rack., MD;  Location: [facility];  Service: Orthopedics;  Laterality: Right;  . TONSILLECTOMY AND ADENOIDECTOMY     age 63  . TUBAL LIGATION  1986  . WISDOM TOOTH EXTRACTION      Current Medications: No outpatient medications have been marked as taking for the 06/13/18 encounter (Office Visit) with Nahser, Wonda Cheng, MD.     Allergies:   Levaquin [levofloxacin] and Nickel   Social History   Socioeconomic History  . Marital status: Married    Spouse name: Not on  file  . Number of children: Not on file  . Years of education: Not on file  . Highest education level: Not on file  Occupational History  . Occupation: Risk analyst: [employer]  . Financial resource strain: Not on file  . Food insecurity:    Worry: Not on file    Inability: Not on file  . Transportation needs:    Medical: Not on file    Non-medical: Not on file  Tobacco Use  . Smoking status: Never Smoker  . Smokeless tobacco: Never Used  Substance and Sexual Activity  . Alcohol use: Yes    Alcohol/week: 1.2 oz    Types: 2 Standard drinks or equivalent per week    Comment: Twice a month   . Drug use: No  . Sexual activity: Yes    Partners: Male    Birth control/protection: Post-menopausal  Lifestyle  . Physical activity:    Days per week: Not on file    Minutes per session: Not on file  . Stress: Not on file  Relationships  . Social connections:    Talks on phone: Not on file    Gets together: Not on file    Attends religious service: Not on file    Active member of club or organization: Not on file    Attends meetings of clubs or organizations: Not on file    Relationship status: Not on file  Other Topics Concern  . Not on file  Social History Narrative  . Not on file     Family History: The patient's family history includes COPD (age of onset: 33) in her brother; Emphysema in her father; Hypertension in her sister; Lung cancer (age of onset: 51) in her father; Stroke in her mother; Thyroid disease in her sister.  ROS:   Please see the history of present illness.     All other systems reviewed and are negative.  EKGs/Labs/Other Studies Reviewed:    The following studies were reviewed today:   EKG:     Recent Labs: 01/24/2018: Pro B Natriuretic peptide (BNP) 23.0 01/31/2018: B Natriuretic Peptide 13.9; BUN 13; Creatinine, Ser 0.95; Potassium 3.8; Sodium 139 03/09/2018: TSH 1.17 03/23/2018: Hemoglobin 13.5; Platelets 406.0  Recent  Lipid Panel    Component Value Date/Time   CHOL 247 (H) 02/05/2015 0850   TRIG 82 02/05/2015 0850   HDL 79 02/05/2015 0850   CHOLHDL 3.1 02/05/2015 0850   VLDL 16 02/05/2015 0850   LDLCALC 152 (H) 02/05/2015 0850    Physical Exam:    VS:  BP (!) 144/78   Pulse 63   Ht 5\' 1"  (  1.549 m)   Wt 238 lb (108 kg)   LMP 12/12/2005   SpO2 98%   BMI 44.97 kg/m     Wt Readings from Last 3 Encounters:  06/13/18 238 lb (108 kg)  05/24/18 236 lb (107 kg)  03/23/18 230 lb (104.3 kg)     GEN:  Middle age female, morbid obesity  HEENT: Normal NECK: No JVD; No carotid bruits LYMPHATICS: No lymphadenopathy CARDIAC:  RR,    RESPIRATORY:  Clear to auscultation without rales, wheezing or rhonchi  ABDOMEN: Soft, non-tender, non-distended MUSCULOSKELETAL:  No edema; No deformity  SKIN: Warm and dry NEUROLOGIC:  Alert and oriented x 3 PSYCHIATRIC:  Normal affect   ASSESSMENT:    No diagnosis found. PLAN:    In order of problems listed above:  1. Shortness breath: Tyleigh presents for further evaluation of her shortness of breath.  Her echocardiogram reveals normal left ventricular function.  Her coronary CT angiogram is essentially normal.  She has a very low coronary calcium score.  At this point I do not think that she has any significant cardiac abnormalities.  I suspect that her shortness of breath is at least partially due to the 80 pounds that she is gained over the past several years.  She is in a very tough spot emotionally .   She spends most of her time sitting with her husband who has stage IV cancer.  He eats a very poor diet including mostly fast foods.  She eats the fast food along with them and this is resulted in the 80+ pounds of weight gain.  We will see her on an as-needed basis.  I have given her the name of a psychologist who might be able to help her work through some of her stress and grief issues.  She will follow-up with the pulmonologist.  Medication  Adjustments/Labs and Tests Ordered: Current medicines are reviewed at length with the patient today.  Concerns regarding medicines are outlined above.  No orders of the defined types were placed in this encounter.  No orders of the defined types were placed in this encounter.   Patient Instructions  [facility] [phone]   Medication Instructions:  Your physician recommends that you continue on your current medications as directed. Please refer to the Current Medication list given to you today.   Labwork: None Ordered   Testing/Procedures: None Ordered   Follow-Up: Your physician recommends that you schedule a follow-up appointment in: as needed with Dr. Acie Fredrickson   If you need a refill on your cardiac medications before your next appointment, please call your pharmacy.   Thank you for choosing [facility]! Christen Bame, RN [phone]       Signed, Mertie Moores, MD  06/13/2018 5:13 PM    [facility]

## 2018-06-13 NOTE — Patient Instructions (Addendum)
[facility] [phone]   Medication Instructions:  Your physician recommends that you continue on your current medications as directed. Please refer to the Current Medication list given to you today.   Labwork: None Ordered   Testing/Procedures: None Ordered   Follow-Up: Your physician recommends that you schedule a follow-up appointment in: as needed with Dr. Acie Fredrickson   If you need a refill on your cardiac medications before your next appointment, please call your pharmacy.   Thank you for choosing [facility]! Christen Bame, RN [phone]

## 2018-06-15 MED FILL — SHIPPING COST: 1 days supply | Qty: 1 | Fill #0

## 2018-06-19 DIAGNOSIS — R5382 Chronic fatigue, unspecified: Secondary | ICD-10-CM | POA: Diagnosis not present

## 2018-06-19 DIAGNOSIS — R946 Abnormal results of thyroid function studies: Secondary | ICD-10-CM | POA: Diagnosis not present

## 2018-06-19 DIAGNOSIS — R635 Abnormal weight gain: Secondary | ICD-10-CM | POA: Diagnosis not present

## 2018-06-21 DIAGNOSIS — R946 Abnormal results of thyroid function studies: Secondary | ICD-10-CM | POA: Diagnosis not present

## 2018-06-21 DIAGNOSIS — R5382 Chronic fatigue, unspecified: Secondary | ICD-10-CM | POA: Diagnosis not present

## 2018-06-21 DIAGNOSIS — R635 Abnormal weight gain: Secondary | ICD-10-CM | POA: Diagnosis not present

## 2018-06-28 MED FILL — MOMETASONE FUROATE 50 MCG S: 50 | 30 days supply | Qty: 17 | Fill #5

## 2018-07-04 DIAGNOSIS — R946 Abnormal results of thyroid function studies: Secondary | ICD-10-CM | POA: Diagnosis not present

## 2018-07-04 DIAGNOSIS — R635 Abnormal weight gain: Secondary | ICD-10-CM | POA: Diagnosis not present

## 2018-07-04 DIAGNOSIS — R5382 Chronic fatigue, unspecified: Secondary | ICD-10-CM | POA: Diagnosis not present

## 2018-07-04 DIAGNOSIS — L74519 Primary focal hyperhidrosis, unspecified: Secondary | ICD-10-CM | POA: Diagnosis not present

## 2018-07-04 MED FILL — DRYSOL DAB-O-MATIC SOLUTION: 20 | 20 days supply | Qty: 35 | Fill #0

## 2018-07-04 MED FILL — DEXAMETHASONE 1 MG TABLET: 1 | 1 days supply | Qty: 1 | Fill #0

## 2018-07-06 DIAGNOSIS — R946 Abnormal results of thyroid function studies: Secondary | ICD-10-CM | POA: Diagnosis not present

## 2018-07-06 DIAGNOSIS — R5382 Chronic fatigue, unspecified: Secondary | ICD-10-CM | POA: Diagnosis not present

## 2018-07-06 DIAGNOSIS — R635 Abnormal weight gain: Secondary | ICD-10-CM | POA: Diagnosis not present

## 2018-07-06 DIAGNOSIS — G4733 Obstructive sleep apnea (adult) (pediatric): Secondary | ICD-10-CM | POA: Diagnosis not present

## 2018-07-15 MED FILL — ZOLPIDEM TARTRATE 10 MG TAB: 10 | 30 days supply | Qty: 30 | Fill #1

## 2018-07-16 MED FILL — SERTRALINE HCL 100 MG TAB: 100 | 30 days supply | Qty: 90 | Fill #2

## 2018-07-16 MED FILL — PANTOPRAZOLE SOD DR 40 MG T: 40 | 90 days supply | Qty: 180 | Fill #3

## 2018-07-17 MED FILL — SHIPPING COST: 1 days supply | Qty: 1 | Fill #0

## 2018-08-06 DIAGNOSIS — G4733 Obstructive sleep apnea (adult) (pediatric): Secondary | ICD-10-CM | POA: Diagnosis not present

## 2018-08-08 MED FILL — ZOLPIDEM TARTRATE 10 MG TAB: 10 | 30 days supply | Qty: 30 | Fill #0

## 2018-08-16 ENCOUNTER — Other Ambulatory Visit: Payer: Self-pay | Admitting: Internal Medicine

## 2018-08-16 MED FILL — SHIPPING COST: 1 days supply | Qty: 1 | Fill #1

## 2018-08-16 MED FILL — BYSTOLIC 10 MG TABLET: 10 | 30 days supply | Qty: 60 | Fill #0

## 2018-08-17 ENCOUNTER — Encounter

## 2018-08-17 ENCOUNTER — Ambulatory Visit: Payer: Self-pay | Admitting: Internal Medicine

## 2018-08-28 MED FILL — SULFAMETHOXAZOLE-TMP DS TAB: 800-160 | 7 days supply | Qty: 14 | Fill #0

## 2018-08-28 MED FILL — SHIPPING COST: 1 days supply | Qty: 1 | Fill #2

## 2018-09-06 DIAGNOSIS — G4733 Obstructive sleep apnea (adult) (pediatric): Secondary | ICD-10-CM | POA: Diagnosis not present

## 2018-09-11 ENCOUNTER — Other Ambulatory Visit: Payer: Self-pay | Admitting: Internal Medicine

## 2018-09-11 MED FILL — SERTRALINE HCL 100 MG TAB: 100 | 30 days supply | Qty: 90 | Fill #3

## 2018-09-11 MED FILL — CELECOXIB 200 MG CAPSULE: 200 | 30 days supply | Qty: 30 | Fill #2

## 2018-09-11 MED FILL — SHIPPING COST: 1 days supply | Qty: 1 | Fill #3

## 2018-09-11 MED FILL — ZOLPIDEM TARTRATE 10 MG TAB: 10 | 30 days supply | Qty: 30 | Fill #1

## 2018-09-11 MED FILL — MONTELUKAST SOD 10 MG TAB: 10 | 90 days supply | Qty: 90 | Fill #1

## 2018-09-13 DIAGNOSIS — R3129 Other microscopic hematuria: Secondary | ICD-10-CM | POA: Diagnosis not present

## 2018-09-13 DIAGNOSIS — R102 Pelvic and perineal pain: Secondary | ICD-10-CM | POA: Diagnosis not present

## 2018-09-13 DIAGNOSIS — N8111 Cystocele, midline: Secondary | ICD-10-CM | POA: Diagnosis not present

## 2018-09-20 DIAGNOSIS — N816 Rectocele: Secondary | ICD-10-CM | POA: Diagnosis not present

## 2018-09-20 DIAGNOSIS — R102 Pelvic and perineal pain: Secondary | ICD-10-CM | POA: Diagnosis not present

## 2018-09-20 DIAGNOSIS — N8111 Cystocele, midline: Secondary | ICD-10-CM | POA: Diagnosis not present

## 2018-09-21 MED FILL — MOMETASONE FUROATE 50 MCG S: 50 | 30 days supply | Qty: 17 | Fill #6

## 2018-09-21 MED FILL — SHIPPING COST: 1 days supply | Qty: 1 | Fill #4

## 2018-09-24 DIAGNOSIS — E559 Vitamin D deficiency, unspecified: Secondary | ICD-10-CM | POA: Diagnosis not present

## 2018-09-24 DIAGNOSIS — E669 Obesity, unspecified: Secondary | ICD-10-CM | POA: Diagnosis not present

## 2018-09-24 DIAGNOSIS — R5383 Other fatigue: Secondary | ICD-10-CM | POA: Diagnosis not present

## 2018-09-24 DIAGNOSIS — Z131 Encounter for screening for diabetes mellitus: Secondary | ICD-10-CM | POA: Diagnosis not present

## 2018-09-24 DIAGNOSIS — I10 Essential (primary) hypertension: Secondary | ICD-10-CM | POA: Diagnosis not present

## 2018-09-24 DIAGNOSIS — E785 Hyperlipidemia, unspecified: Secondary | ICD-10-CM | POA: Diagnosis not present

## 2018-09-24 MED FILL — BYSTOLIC 10 MG TABLET: 10 | 90 days supply | Qty: 90 | Fill #0

## 2018-09-24 MED FILL — SHIPPING COST: 1 days supply | Qty: 1 | Fill #5

## 2018-10-06 DIAGNOSIS — G4733 Obstructive sleep apnea (adult) (pediatric): Secondary | ICD-10-CM | POA: Diagnosis not present

## 2018-10-11 ENCOUNTER — Telehealth: Payer: Self-pay | Admitting: Internal Medicine

## 2018-10-11 NOTE — Telephone Encounter (Signed)
OV notes have been faxed to [facility]. Nothing further was needed.

## 2018-10-17 DIAGNOSIS — G4733 Obstructive sleep apnea (adult) (pediatric): Secondary | ICD-10-CM | POA: Diagnosis not present

## 2018-10-18 MED FILL — ZOLPIDEM TARTRATE 10 MG TAB: 10 | 30 days supply | Qty: 30 | Fill #0

## 2018-10-18 MED FILL — SHIPPING COST: 1 days supply | Qty: 1 | Fill #6

## 2018-10-22 MED FILL — ESTRADIOL 0.5 MG TABLET: 0.5 | 30 days supply | Qty: 30 | Fill #0

## 2018-10-22 MED FILL — SHIPPING COST: 1 days supply | Qty: 1 | Fill #7

## 2018-10-22 MED FILL — PROGESTERONE 100 MG CAPSULE: 100 | 30 days supply | Qty: 30 | Fill #0

## 2018-10-25 MED FILL — LORazepam 1 MG TABS: 1 | 25 days supply | Qty: 75 | Fill #0

## 2018-10-25 MED FILL — SHIPPING COST: 1 days supply | Qty: 1 | Fill #8

## 2018-10-26 DIAGNOSIS — G4733 Obstructive sleep apnea (adult) (pediatric): Secondary | ICD-10-CM | POA: Diagnosis not present

## 2018-11-05 DIAGNOSIS — E669 Obesity, unspecified: Secondary | ICD-10-CM | POA: Diagnosis not present

## 2018-11-05 DIAGNOSIS — E559 Vitamin D deficiency, unspecified: Secondary | ICD-10-CM | POA: Diagnosis not present

## 2018-11-05 DIAGNOSIS — Z780 Asymptomatic menopausal state: Secondary | ICD-10-CM | POA: Diagnosis not present

## 2018-11-05 DIAGNOSIS — M7918 Myalgia, other site: Secondary | ICD-10-CM | POA: Diagnosis not present

## 2018-11-05 MED FILL — NP THYROID 60 MG TABLET: 60 | 30 days supply | Qty: 30 | Fill #0

## 2018-11-05 MED FILL — ESTRADIOL 1 MG TABS: 1 | 30 days supply | Qty: 30 | Fill #0

## 2018-11-05 MED FILL — PROGESTERONE 200 MG CAPSULE: 200 | 30 days supply | Qty: 30 | Fill #0

## 2018-11-05 MED FILL — SHIPPING COST: 1 days supply | Qty: 1 | Fill #9

## 2018-11-06 DIAGNOSIS — G4733 Obstructive sleep apnea (adult) (pediatric): Secondary | ICD-10-CM | POA: Diagnosis not present

## 2018-11-07 DIAGNOSIS — Z6841 Body Mass Index (BMI) 40.0 and over, adult: Secondary | ICD-10-CM | POA: Diagnosis not present

## 2018-11-07 DIAGNOSIS — N9489 Other specified conditions associated with female genital organs and menstrual cycle: Secondary | ICD-10-CM | POA: Diagnosis not present

## 2018-11-07 DIAGNOSIS — N819 Female genital prolapse, unspecified: Secondary | ICD-10-CM | POA: Diagnosis not present

## 2018-11-07 DIAGNOSIS — R102 Pelvic and perineal pain: Secondary | ICD-10-CM | POA: Diagnosis not present

## 2018-11-07 DIAGNOSIS — N816 Rectocele: Secondary | ICD-10-CM | POA: Diagnosis not present

## 2018-11-07 DIAGNOSIS — N8111 Cystocele, midline: Secondary | ICD-10-CM | POA: Diagnosis not present

## 2018-11-07 MED FILL — tiZANidine HCL 4 MG TABS: 4 | 30 days supply | Qty: 90 | Fill #0

## 2018-11-14 ENCOUNTER — Ambulatory Visit (HOSPITAL_COMMUNITY)
Admission: RE | Admit: 2018-11-14 | Discharge: 2018-11-14 | Disposition: A | Discharge status: Home / Self Care | Payer: 59 | Source: Ambulatory Visit | Attending: Obstetrics and Gynecology | Admitting: Obstetrics and Gynecology

## 2018-11-14 ENCOUNTER — Other Ambulatory Visit (HOSPITAL_COMMUNITY): Payer: Self-pay | Admitting: Obstetrics and Gynecology

## 2018-11-14 DIAGNOSIS — R102 Pelvic and perineal pain: Secondary | ICD-10-CM | POA: Insufficient documentation

## 2018-11-15 ENCOUNTER — Other Ambulatory Visit (HOSPITAL_COMMUNITY): Payer: Self-pay | Admitting: Nurse Practitioner

## 2018-11-15 DIAGNOSIS — M5416 Radiculopathy, lumbar region: Secondary | ICD-10-CM

## 2018-11-18 ENCOUNTER — Ambulatory Visit (HOSPITAL_COMMUNITY)
Admission: RE | Admit: 2018-11-18 | Discharge: 2018-11-18 | Disposition: A | Discharge status: Home / Self Care | Payer: 59 | Source: Ambulatory Visit | Attending: Nurse Practitioner | Admitting: Nurse Practitioner

## 2018-11-18 DIAGNOSIS — M4316 Spondylolisthesis, lumbar region: Secondary | ICD-10-CM | POA: Diagnosis not present

## 2018-11-18 DIAGNOSIS — E882 Lipomatosis, not elsewhere classified: Secondary | ICD-10-CM | POA: Insufficient documentation

## 2018-11-18 DIAGNOSIS — M4726 Other spondylosis with radiculopathy, lumbar region: Secondary | ICD-10-CM | POA: Diagnosis not present

## 2018-11-18 DIAGNOSIS — M5416 Radiculopathy, lumbar region: Secondary | ICD-10-CM | POA: Insufficient documentation

## 2018-11-18 DIAGNOSIS — M48061 Spinal stenosis, lumbar region without neurogenic claudication: Secondary | ICD-10-CM | POA: Insufficient documentation

## 2018-11-19 ENCOUNTER — Telehealth: Payer: Self-pay | Admitting: Internal Medicine

## 2018-11-19 NOTE — Telephone Encounter (Signed)
Hi Dr. Hilarie Fredrickson, this patient would like to continue her care with Dr. Carlean Purl, she mentioned that her husband sees him and diagnosed him with colon cancer. You saw pt only once in 2013. Are you ok with the switch? Please advise, thank you.

## 2018-11-19 NOTE — Telephone Encounter (Signed)
Okay with me. Thanks 

## 2018-11-19 NOTE — Telephone Encounter (Signed)
OK 

## 2018-11-19 NOTE — Telephone Encounter (Signed)
Hi Dr. Carlean Purl, please see messages below and advise if it is ok to schedule patient with you. Thank you.

## 2018-11-20 ENCOUNTER — Encounter: Payer: Self-pay | Admitting: Internal Medicine

## 2018-11-20 NOTE — Telephone Encounter (Signed)
Consult scheduled with Dr. Carlean Purl on 12/18/2018 at 1:45pm.

## 2018-11-22 ENCOUNTER — Other Ambulatory Visit (HOSPITAL_COMMUNITY): Payer: Self-pay | Admitting: Nurse Practitioner

## 2018-11-22 DIAGNOSIS — R300 Dysuria: Secondary | ICD-10-CM | POA: Diagnosis not present

## 2018-11-22 DIAGNOSIS — R102 Pelvic and perineal pain: Secondary | ICD-10-CM | POA: Diagnosis not present

## 2018-11-22 DIAGNOSIS — R319 Hematuria, unspecified: Secondary | ICD-10-CM | POA: Diagnosis not present

## 2018-11-22 DIAGNOSIS — R809 Proteinuria, unspecified: Secondary | ICD-10-CM | POA: Diagnosis not present

## 2018-11-22 MED FILL — SHIPPING COST: 1 days supply | Qty: 1 | Fill #10

## 2018-11-22 MED FILL — MOMETASONE FUROATE 50 MCG S: 50 | 30 days supply | Qty: 17 | Fill #0

## 2018-11-23 ENCOUNTER — Other Ambulatory Visit: Payer: Self-pay

## 2018-11-23 MED ORDER — ZOLPIDEM TARTRATE 10 MG PO TABS: ORAL_TABLET | ORAL | 1 refills | Status: DC

## 2018-11-23 MED FILL — ZOLPIDEM TARTRATE 10 MG TAB: 10 | 30 days supply | Qty: 30 | Fill #0

## 2018-11-23 MED FILL — SHIPPING COST: 1 days supply | Qty: 1 | Fill #11

## 2018-11-27 MED FILL — traMADol HCL 50 MG TABS: 50 | 5 days supply | Qty: 10 | Fill #0

## 2018-11-28 ENCOUNTER — Other Ambulatory Visit: Payer: Self-pay | Admitting: Internal Medicine

## 2018-11-28 ENCOUNTER — Ambulatory Visit (HOSPITAL_COMMUNITY)
Admission: RE | Admit: 2018-11-28 | Discharge: 2018-11-28 | Disposition: A | Discharge status: Home / Self Care | Payer: 59 | Source: Ambulatory Visit | Attending: Nurse Practitioner | Admitting: Nurse Practitioner

## 2018-11-28 DIAGNOSIS — R102 Pelvic and perineal pain: Secondary | ICD-10-CM | POA: Insufficient documentation

## 2018-11-28 LAB — POCT I-STAT CREATININE: Creatinine, Ser: 1.1 mg/dL — ABNORMAL HIGH (ref 0.44–1.00)

## 2018-11-28 MED ORDER — IOHEXOL 300 MG/ML  SOLN
100.0000 mL | Freq: Once | INTRAMUSCULAR | Status: AC | PRN
  Administered 2018-11-28: 100 mL via INTRAVENOUS

## 2018-11-28 MED ORDER — SODIUM CHLORIDE (PF) 0.9 % IJ SOLN
INTRAMUSCULAR | Status: AC
  Filled 2018-11-28: qty 100

## 2018-11-29 ENCOUNTER — Ambulatory Visit: Payer: Self-pay | Admitting: Internal Medicine

## 2018-11-29 MED FILL — BYSTOLIC 10 MG TABLET: 10 | 90 days supply | Qty: 180 | Fill #0

## 2018-11-30 DIAGNOSIS — M5416 Radiculopathy, lumbar region: Secondary | ICD-10-CM | POA: Diagnosis not present

## 2018-11-30 DIAGNOSIS — M5136 Other intervertebral disc degeneration, lumbar region: Secondary | ICD-10-CM | POA: Diagnosis not present

## 2018-11-30 DIAGNOSIS — M549 Dorsalgia, unspecified: Secondary | ICD-10-CM | POA: Diagnosis not present

## 2018-11-30 DIAGNOSIS — M47816 Spondylosis without myelopathy or radiculopathy, lumbar region: Secondary | ICD-10-CM | POA: Diagnosis not present

## 2018-11-30 DIAGNOSIS — M5137 Other intervertebral disc degeneration, lumbosacral region: Secondary | ICD-10-CM | POA: Diagnosis not present

## 2018-11-30 DIAGNOSIS — M4316 Spondylolisthesis, lumbar region: Secondary | ICD-10-CM | POA: Diagnosis not present

## 2018-11-30 DIAGNOSIS — M546 Pain in thoracic spine: Secondary | ICD-10-CM | POA: Diagnosis not present

## 2018-11-30 DIAGNOSIS — M48062 Spinal stenosis, lumbar region with neurogenic claudication: Secondary | ICD-10-CM | POA: Diagnosis not present

## 2018-11-30 DIAGNOSIS — Z6841 Body Mass Index (BMI) 40.0 and over, adult: Secondary | ICD-10-CM | POA: Diagnosis not present

## 2018-12-03 MED FILL — predniSONE 20 MG TABS: 20 | 5 days supply | Qty: 10 | Fill #0

## 2018-12-03 MED FILL — traMADol HCL 50 MG TABS: 50 | 15 days supply | Qty: 30 | Fill #0

## 2018-12-06 DIAGNOSIS — G4733 Obstructive sleep apnea (adult) (pediatric): Secondary | ICD-10-CM | POA: Diagnosis not present

## 2018-12-06 MED FILL — DIAZEPAM 10 MG TABS: 10 | 10 days supply | Qty: 30 | Fill #0

## 2018-12-09 ENCOUNTER — Encounter: Payer: Self-pay | Admitting: Emergency Medicine

## 2018-12-09 DIAGNOSIS — H3340 Traction detachment of retina, unspecified eye: Secondary | ICD-10-CM | POA: Insufficient documentation

## 2018-12-09 DIAGNOSIS — F4001 Agoraphobia with panic disorder: Secondary | ICD-10-CM | POA: Insufficient documentation

## 2018-12-09 DIAGNOSIS — F419 Anxiety disorder, unspecified: Secondary | ICD-10-CM | POA: Insufficient documentation

## 2018-12-10 DIAGNOSIS — E669 Obesity, unspecified: Secondary | ICD-10-CM | POA: Diagnosis not present

## 2018-12-10 DIAGNOSIS — R102 Pelvic and perineal pain: Secondary | ICD-10-CM | POA: Diagnosis not present

## 2018-12-10 DIAGNOSIS — R5383 Other fatigue: Secondary | ICD-10-CM | POA: Diagnosis not present

## 2018-12-10 DIAGNOSIS — I10 Essential (primary) hypertension: Secondary | ICD-10-CM | POA: Diagnosis not present

## 2018-12-10 DIAGNOSIS — Z780 Asymptomatic menopausal state: Secondary | ICD-10-CM | POA: Diagnosis not present

## 2018-12-10 MED FILL — predniSONE 20 MG TABS: 20 | 5 days supply | Qty: 10 | Fill #0

## 2018-12-11 MED FILL — SHIPPING COST: 1 days supply | Qty: 1 | Fill #12

## 2018-12-11 MED FILL — tiZANidine HCL 4 MG TABS: 4 | 30 days supply | Qty: 90 | Fill #1

## 2018-12-11 MED FILL — NP THYROID 90 MG TABLET: 90 | 30 days supply | Qty: 30 | Fill #0

## 2018-12-11 MED FILL — ESTRADIOL 2 MG TABS: 2 | 30 days supply | Qty: 30 | Fill #0

## 2018-12-11 MED FILL — PROGESTERONE 200 MG CAPSULE: 200 | 30 days supply | Qty: 60 | Fill #0

## 2018-12-19 ENCOUNTER — Ambulatory Visit: Payer: 59 | Admitting: Psychiatry

## 2018-12-19 ENCOUNTER — Encounter: Payer: Self-pay | Admitting: Psychiatry

## 2018-12-19 DIAGNOSIS — R102 Pelvic and perineal pain: Secondary | ICD-10-CM | POA: Diagnosis not present

## 2018-12-19 DIAGNOSIS — F411 Generalized anxiety disorder: Secondary | ICD-10-CM

## 2018-12-19 DIAGNOSIS — F331 Major depressive disorder, recurrent, moderate: Secondary | ICD-10-CM

## 2018-12-19 DIAGNOSIS — N819 Female genital prolapse, unspecified: Secondary | ICD-10-CM | POA: Diagnosis not present

## 2018-12-19 DIAGNOSIS — M62838 Other muscle spasm: Secondary | ICD-10-CM | POA: Diagnosis not present

## 2018-12-19 DIAGNOSIS — F4001 Agoraphobia with panic disorder: Secondary | ICD-10-CM

## 2018-12-19 DIAGNOSIS — M6281 Muscle weakness (generalized): Secondary | ICD-10-CM | POA: Diagnosis not present

## 2018-12-19 DIAGNOSIS — N9489 Other specified conditions associated with female genital organs and menstrual cycle: Secondary | ICD-10-CM | POA: Diagnosis not present

## 2018-12-19 MED FILL — BENZONATATE 100 MG CAP: 100 | 7 days supply | Qty: 42 | Fill #0

## 2018-12-19 NOTE — Progress Notes (Signed)
[name] [name] [id number] 1956/07/16 63 y.o.  Subjective:   Patient ID:  [name] [name] is a 63 y.o. (DOB Jul 04, 1956) female.  Chief Complaint:  Chief Complaint  Patient presents with  . Follow-up    Medication Management  . Anxiety    HPI [name] [name] presents to the office today for follow-up of anxiety.  [name] [name] died in 2023-10-22 from cancer.  11 weeks ago.  Had a horrible day yesterday and cried all day.  Did work.  Had a good day today with friends.  Blessed to have supportive kids who helped a lot.    Wonderful service for her husband.  The hard part is she's 35 miles from her kids.  Sees them now weekly.  Very close to them.Really misses him.  D getting married in March.  Her 2 dogs help her a lot too.  Sleep with her now.  Married 41 years.  Is able to focus on positive memories.  Not ready to move.  During the time of H in Hospice didn't take Ativan and hasn't taken much since.  PCP started her on progesterone to help her sleep.  Overall a lot better than 3 years ago.  Had been grieving his diagnosis all this time.  Thinks it's pretty normal.  Good support.  No panic attacks.  Sweats are not better. Despite cutting the dose by 50%.    Taking a whole Ambien and awakening once.  Went 6 weeks without it while H in Hospice.  Review of Systems:  Review of Systems  Constitutional: Positive for unexpected weight change.       Sweating  Genitourinary: Positive for pelvic pain.  Neurological: Negative for tremors and weakness.  Psychiatric/Behavioral: Negative for agitation, behavioral problems, confusion, decreased concentration, dysphoric mood, hallucinations, self-injury, sleep disturbance and suicidal ideas. The patient is not nervous/anxious and is not hyperactive.     Medications: I have reviewed the patient's current medications.  Current Outpatient Medications  Medication Sig Dispense Refill  . albuterol (PROAIR HFA) 108 (90 BASE) MCG/ACT inhaler 2 puffs every  4 hours as needed only  if your can't catch your breath 1 Inhaler 1  . B Complex Vitamins (B-COMPLEX/B-12) LIQD Place 5 mLs under the tongue daily at 2 PM daily at 2 PM.    . celecoxib (CELEBREX) 200 MG capsule Take 200 mg by mouth daily as needed.   2  . Cholecalciferol (VITAMIN D) 2000 UNITS tablet Take 5,000 Units by mouth daily.     Marland Kitchen estradiol (ESTRACE) 2 MG tablet Take 2 mg by mouth daily.    . fexofenadine (ALLEGRA) 180 MG tablet Take 180 mg by mouth daily.    Marland Kitchen LORazepam (ATIVAN) 1 MG tablet Take 1 mg by mouth daily as needed.  0  . mometasone (NASONEX) 50 MCG/ACT nasal spray PLACE 2 SPRAYS INTO THE NOSE DAILY. 17 g 3  . montelukast (SINGULAIR) 10 MG tablet TAKE 1 TABLET BY MOUTH ONCE DAILY 90 tablet 0  . nebivolol (BYSTOLIC) 10 MG tablet Take 1 tablet (10 mg total) by mouth 2 (two) times daily. 60 tablet 0  . Nebulizers (NEBULIZER COMPRESSOR) MISC Use as directed 1 each 0  . pantoprazole (PROTONIX) 40 MG tablet Take 1 tablet (40 mg total) by mouth 2 (two) times daily before a meal. 60 tablet 5  . progesterone (PROMETRIUM) 200 MG capsule Take 400 mg by mouth daily.    . sertraline (ZOLOFT) 100 MG tablet Take by mouth. 50 mg in morning,  100 mg at bedtime    . thyroid (ARMOUR) 90 MG tablet Take 90 mg by mouth daily.    . Travoprost, BAK Free, (TRAVATAN) 0.004 % SOLN ophthalmic solution Place 1 drop into both eyes at bedtime.    Marland Kitchen zolpidem (AMBIEN) 10 MG tablet Take 1/2 - 1 Tablet by mouth every night at Bedtime as needed for Sleep. 30 tablet 1  . Probiotic Product (PROBIOTIC DAILY) CAPS Take 1 capsule by mouth daily.     No current facility-administered medications for this visit.     Medication Side Effects: Other: ? sweating related.  Allergies:  Allergies  Allergen Reactions  . Levaquin [Levofloxacin] Other (See Comments)    MUSCLE PAIN  . Nickel Rash    Past Medical History:  Diagnosis Date  . Anxiety   . Bronchiectasis   . Bronchitis    chronic  . Chondromalacia of  left patella 03/2013  . COPD (chronic obstructive pulmonary disease) ([place])   . Dental crown present    upper  . Depression   . Environmental allergies    receives allergy shots  . GERD (gastroesophageal reflux disease)   . 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 - no current med.  . Glucose intolerance (impaired glucose tolerance)    on Metformin  . H/O hiatal hernia    "sliding"  . History of echocardiogram    Echo 1/16: EF 60-65, no RWMA, Gr 1 DD, mild LAE, trivial pericardial eff post to heart  . History of echocardiogram    Echo 11/17: EF 55-60, no RWMA, Gr 1 DD, normal RV function.  Marland Kitchen History of palpitations    Holter 12/15: NSR, PACs  . Hx of migraines   . Hypothyroidism   . Osteoarthritis    knees  . Positional headache since 1997   if lies on left side    Family History  Problem Relation Age of Onset  . Lung cancer Father 2       dies age 11  . Emphysema Father   . COPD Brother 80       died age 16  . Stroke Mother   . Hypertension Sister   . Thyroid disease Sister        Graves Disease    Social History   Socioeconomic History  . Marital status: Widowed    Spouse name: Not on file  . Number of children: Not on file  . Years of education: Not on file  . Highest education level: Not on file  Occupational History  . Occupation: Risk analyst: [employer]  . Financial resource strain: Not on file  . Food insecurity:    Worry: Not on file    Inability: Not on file  . Transportation needs:    Medical: Not on file    Non-medical: Not on file  Tobacco Use  . Smoking status: Never Smoker  . Smokeless tobacco: Never Used  Substance and Sexual Activity  . Alcohol use: Yes    Alcohol/week: 2.0 standard drinks    Types: 2 Standard drinks or equivalent per week    Comment: Twice a month   . Drug use: No  . Sexual activity: Yes    Partners: Male    Birth control/protection: Post-menopausal  Lifestyle  . Physical  activity:    Days per week: Not on file    Minutes per session: Not on file  . Stress: Not on  file  Relationships  . Social connections:    Talks on phone: Not on file    Gets together: Not on file    Attends religious service: Not on file    Active member of club or organization: Not on file    Attends meetings of clubs or organizations: Not on file    Relationship status: Not on file  . Intimate partner violence:    Fear of current or ex partner: Not on file    Emotionally abused: Not on file    Physically abused: Not on file    Forced sexual activity: Not on file  Other Topics Concern  . Not on file  Social History Narrative  . Not on file    Past Medical History, Surgical history, Social history, and Family history were reviewed and updated as appropriate.   Please see review of systems for further details on the patient's review from today.   Objective:   Physical Exam:  LMP 12/12/2005   Physical Exam Constitutional:      General: She is not in acute distress.    Appearance: She is well-developed. She is obese.  Musculoskeletal:        General: No deformity.  Neurological:     Mental Status: She is alert and oriented to person, place, and time.     Motor: No tremor.     Coordination: Coordination normal.     Gait: Gait normal.  Psychiatric:        Attention and Perception: Attention and perception normal.        Mood and Affect: Mood is not anxious or depressed. Affect is not labile, blunt, angry or inappropriate.        Speech: Speech normal.        Behavior: Behavior normal.        Thought Content: Thought content normal. Thought content does not include homicidal or suicidal ideation. Thought content does not include homicidal or suicidal plan.        Cognition and Memory: Cognition normal.        Judgment: Judgment normal.     Comments: Insight intact. No auditory or visual hallucinations. No delusions.    Gained 100# while H sick.  Lab Review:      Component Value Date/Time   NA 139 01/31/2018 1813   K 3.8 01/31/2018 1813   CL 104 01/31/2018 1813   CO2 23 01/31/2018 1813   GLUCOSE 93 01/31/2018 1813   BUN 13 01/31/2018 1813   CREATININE 1.10 (H) 11/28/2018 1605   CALCIUM 9.3 01/31/2018 1813   PROT 6.7 02/05/2015 0850   ALBUMIN 4.4 02/05/2015 0850   AST 20 02/05/2015 0850   ALT 19 02/05/2015 0850   ALKPHOS 53 02/05/2015 0850   BILITOT 0.5 02/05/2015 0850   GFRNONAA >60 01/31/2018 1813   GFRAA >60 01/31/2018 1813       Component Value Date/Time   WBC 10.2 03/23/2018 1244   RBC 4.32 03/23/2018 1244   HGB 13.5 03/23/2018 1244   HCT 40.0 03/23/2018 1244   PLT 406.0 (H) 03/23/2018 1244   MCV 92.6 03/23/2018 1244   MCH 31.6 01/31/2018 1813   MCHC 33.9 03/23/2018 1244   RDW 13.9 03/23/2018 1244   LYMPHSABS 2.5 03/23/2018 1244   MONOABS 0.6 03/23/2018 1244   EOSABS 0.1 03/23/2018 1244   BASOSABS 0.1 03/23/2018 1244    No results found for: POCLITH, LITHIUM   No results found for: PHENYTOIN, PHENOBARB, VALPROATE, CBMZ   .res  Assessment: Plan:    Major depressive disorder, recurrent episode, moderate (HCC)  Panic disorder with agoraphobia  Generalized anxiety disorder   Disc the difference between depression and grief.  Her H would not talk about his cancer and wouldn't prepare her for his death. Supportive and grief work.  Call if depression occurs and persists.  We discussed the short-term risks associated with benzodiazepines including sedation and increased fall risk among others.  Discussed long-term side effect risk including dependence, potential withdrawal symptoms, and the potential eventual dose-related risk of dementia.  Disc the risk of Ambien amnesia.  Now is not time to reduce the sertraline.  Do not change.  35 min appt  Lynder Parents, MD, DFAPA       Please see After Visit Summary for patient specific instructions.  Future Appointments  Date Time Provider [place]  12/28/2018   1:45 PM Gatha Mayer, MD [facility]  01/10/2019  3:30 PM Deneise Lever, MD [facility] None    No orders of the defined types were placed in this encounter.     -------------------------------

## 2018-12-21 MED FILL — DIAZEPAM 10 MG TABS: 10 | 10 days supply | Qty: 30 | Fill #1

## 2018-12-21 MED FILL — predniSONE 20 MG TABS: 20 | 5 days supply | Qty: 10 | Fill #1

## 2018-12-21 MED FILL — SHIPPING COST: 1 days supply | Qty: 1 | Fill #13

## 2018-12-21 MED FILL — ZOLPIDEM TARTRATE 10 MG TAB: 10 | 30 days supply | Qty: 30 | Fill #1

## 2018-12-21 MED FILL — VENTOLIN HFA 90 MCG INHALER: 108 (90 BAS | 25 days supply | Qty: 18 | Fill #0

## 2018-12-25 ENCOUNTER — Other Ambulatory Visit: Payer: Self-pay

## 2018-12-25 DIAGNOSIS — M62838 Other muscle spasm: Secondary | ICD-10-CM | POA: Diagnosis not present

## 2018-12-25 DIAGNOSIS — M6281 Muscle weakness (generalized): Secondary | ICD-10-CM | POA: Diagnosis not present

## 2018-12-25 DIAGNOSIS — N819 Female genital prolapse, unspecified: Secondary | ICD-10-CM | POA: Diagnosis not present

## 2018-12-25 DIAGNOSIS — N3946 Mixed incontinence: Secondary | ICD-10-CM | POA: Diagnosis not present

## 2018-12-25 DIAGNOSIS — R102 Pelvic and perineal pain: Secondary | ICD-10-CM | POA: Diagnosis not present

## 2018-12-25 MED ORDER — SERTRALINE HCL 100 MG PO TABS: ORAL_TABLET | ORAL | 5 refills | Status: DC

## 2018-12-25 MED FILL — traMADol HCL 50 MG TABS: 50 | 15 days supply | Qty: 30 | Fill #0

## 2018-12-25 MED FILL — SERTRALINE HCL 100 MG TAB: 100 | 30 days supply | Qty: 45 | Fill #0

## 2018-12-28 ENCOUNTER — Encounter: Payer: Self-pay | Admitting: Internal Medicine

## 2018-12-28 ENCOUNTER — Ambulatory Visit: Payer: 59 | Admitting: Internal Medicine

## 2018-12-28 VITALS — BP 122/76 | HR 84 | Ht 62.0 in | Wt 235.0 lb

## 2018-12-28 DIAGNOSIS — R197 Diarrhea, unspecified: Secondary | ICD-10-CM | POA: Diagnosis not present

## 2018-12-28 DIAGNOSIS — K6289 Other specified diseases of anus and rectum: Secondary | ICD-10-CM

## 2018-12-28 NOTE — Progress Notes (Signed)
[name] [name] 63 y.o. 1956/03/07 [id number]  Assessment & Plan:   Encounter Diagnoses  Name Primary?  . Diarrhea, unspecified type Yes  . Rectal pain     She may be right that the fat in her diet is leading to the loose stools postcholecystectomy but I think it is worthwhile to pursue a colonoscopy for investigation.  I could not find a cause for her rectal symptoms or her perineal symptoms on my exam.  I agree that pelvic floor physical therapy would be helpful though we did not specifically discussed that today I will bring that up to her at the time of her colonoscopy if she has not followed through with that.  Also agree with Dr. Zigmund Daniel that defecography could be helpful depending upon what happens with pelvic floor physical therapy.  Further plans pending the colonoscopy and clinical course.  We did discuss her children's increased risk of colon cancer and she says her 42 year old son I believe has been recommended to have a colonoscopy and is willing to do so, the other children are younger than that.  I appreciate the opportunity to care for this patient. CC: Doree Albee, MD Dr. Edward Jolly Dr. [place] Pink  Subjective:   Chief Complaint: Rectal pain vaginal pain need colonoscopy  HPI [name] [name] is here because of problems with pelvic pain rectal pain and wanting to discuss possible colonoscopy.  She previously saw Dr. Hilarie Fredrickson but I cared for her husband who unfortunately died of metastatic colon cancer about 12 weeks ago so she asked to see me.  While she was caring for her husband last year she had to pull them up in the bed a fair amount and move him manipulating and at some point along the way she started having a lot of pelvic pain.  She saw her gynecologist, she saw Dr. Zigmund Daniel of urogynecology and she also saw Dr. Sherwood Gambler of neurosurgery and the conclusion was that she had some torn fascia that was causing problems.  At some point she had a "fluid pocket  on imaging" so she did receive other imaging tests.  She had a pelvic ultrasound in December that was negative.  She had an MRI of the lumbar spine with chronic disc damage and disc space narrowing at multiple levels and severe arthritic changes.  Also in December a CT of the abdomen pelvis with contrast, negative.  Her last colonoscopy was in [place] by Dr. Posey Pronto probably around 2013.  She reports over the last year having a change in bowels to where she is having 3 very loose to watery bowel movements a day.  She knows she has had a cholecystectomy and she has been eating "a horrible diet with a lot of fat" and wonders if that might be related.  She attributes this poor diet the stress eating related to her husband's illness.  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  There is stress with that.  She also has a rectal pain that feels both outside and inside at times.  It sharp and painful but not necessarily related to defecation.  She is not had any bleeding.  She wonders if hemorrhoids are bothering her she tried Preparation H without benefit.  Wt Readings from Last 3 Encounters:  12/28/18 235 lb (106.6 kg)  06/13/18 238 lb (108 kg)  05/24/18 236 lb (107 kg)   Review of Dr.  Matthews's note from 11/07/2018, shows that she complained of a new midline perineal pain and chronic left buttock pain.  She had tried a pessary for known cystocele and rectocele without help and she has associated difficulty with bladder emptying and urinary frequency and stress urinary incontinence.  She was diagnosed with possible high tone pelvic floor dysfunction sciatica or other musculoskeletal disorder, stage II distal anterior and posterior vaginal wall prolapse question of pelvic fluid on a bladder scan.  She was referred to pelvic floor physical therapy and had a pelvic ultrasound after that.  Seen in follow-up in  early January had not been to PT.  Advised to continue ice to perineum and complete PT of the pelvic floor.  Allergies  Allergen Reactions  . Levaquin [Levofloxacin] Other (See Comments)    MUSCLE PAIN  . Nickel Rash   Current Meds  Medication Sig  . albuterol (PROAIR HFA) 108 (90 BASE) MCG/ACT inhaler 2 puffs every 4 hours as needed only  if your can't catch your breath  . B Complex Vitamins (B-COMPLEX/B-12) LIQD Place 5 mLs under the tongue daily at 2 PM daily at 2 PM.  . celecoxib (CELEBREX) 200 MG capsule Take 200 mg by mouth daily as needed.   . Cholecalciferol (VITAMIN D) 2000 UNITS tablet Take 5,000 Units by mouth daily.   Marland Kitchen estradiol (ESTRACE) 2 MG tablet Take 2 mg by mouth daily.  . fexofenadine (ALLEGRA) 180 MG tablet Take 180 mg by mouth daily.  Marland Kitchen LORazepam (ATIVAN) 1 MG tablet Take 1 mg by mouth daily as needed.  . mometasone (NASONEX) 50 MCG/ACT nasal spray PLACE 2 SPRAYS INTO THE NOSE DAILY.  . montelukast (SINGULAIR) 10 MG tablet TAKE 1 TABLET BY MOUTH ONCE DAILY  . nebivolol (BYSTOLIC) 10 MG tablet Take 1 tablet (10 mg total) by mouth 2 (two) times daily.  . Nebulizers (NEBULIZER COMPRESSOR) MISC Use as directed  . pantoprazole (PROTONIX) 40 MG tablet Take 1 tablet (40 mg total) by mouth 2 (two) times daily before a meal.  . Probiotic Product (PROBIOTIC DAILY) CAPS Take 1 capsule by mouth daily.  . progesterone (PROMETRIUM) 200 MG capsule Take 400 mg by mouth daily.  . sertraline (ZOLOFT) 100 MG tablet Take 1/2 tablet, 1 tablet at bedtime  . thyroid (ARMOUR) 90 MG tablet Take 90 mg by mouth daily.  . Travoprost, BAK Free, (TRAVATAN) 0.004 % SOLN ophthalmic solution Place 1 drop into both eyes at bedtime.  Marland Kitchen zolpidem (AMBIEN) 10 MG tablet Take 1/2 - 1 Tablet by mouth every night at Bedtime as needed for Sleep.   Past Medical History:  Diagnosis Date  . Anxiety   . Bronchiectasis   . Bronchitis    chronic  . 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 COPD (chronic  obstructive pulmonary disease) ([place])   . Dental crown present    upper  . Depression   . Environmental allergies    receives allergy shots  . GERD (gastroesophageal reflux disease)   . 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 - no current med.  . Glucose intolerance (impaired glucose tolerance)    on Metformin  . H/O hiatal hernia    "sliding"  . History of echocardiogram    Echo 1/16: EF 60-65, no RWMA, Gr 1 DD, mild LAE, trivial pericardial eff post to heart  . History of echocardiogram    Echo 11/17: EF 55-60, no RWMA, Gr 1 DD, normal RV function.  Marland Kitchen History of palpitations  Hol[place] 12/15: NSR, PACs  . Hx of migraines   . Hypothyroidism   . Osteoarthritis    knees  . Positional headache since 1997   if lies on left side   Past Surgical History:  Procedure Laterality Date  . CHOLECYSTECTOMY  04/21/2011  . CHONDROPLASTY  10/19/2012   Procedure: CHONDROPLASTY;  Surgeon: Yvette Rack., MD;  Location: [facility];  Service: Orthopedics;  Laterality: Right;  . COLONOSCOPY    . KNEE ARTHROSCOPY  10/19/2012   Procedure: ARTHROSCOPY KNEE;  Surgeon: Yvette Rack., MD;  Location: [facility];  Service: Orthopedics;  Laterality: Right;  . KNEE ARTHROSCOPY Right 02/01/2013   Procedure: RIGHT KNEE ARTHROSCOPY WITH MEDIAL MENISCECTOMY, DEBRIDEMENT CHONDROMALACIA PATELLA;  Surgeon: Yvette Rack., MD;  Location: [facility];  Service: Orthopedics;  Laterality: Right;  RIGHT KNEE ARTHROSCOPY WITH MEDIAL MENISCECTOMY, DEBRIDEMENT CHONDROMALACIA PATELLA  . KNEE ARTHROSCOPY Left 03/15/2013   Procedure: LEFT KNEE ARTHROSCOPY WITH DEBRIDEMENT/SHAVING CHONDROPLASTY WITH MEDIAL MENISECTOMY;  Surgeon: Yvette Rack., MD;  Location: [facility];  Service: Orthopedics;  Laterality: Left;  . KNEE ARTHROSCOPY WITH LATERAL MENISECTOMY  10/19/2012   Procedure: KNEE ARTHROSCOPY WITH LATERAL MENISECTOMY;  Surgeon: Yvette Rack., MD;  Location:  [facility];  Service: Orthopedics;  Laterality: Right;  . TONSILLECTOMY AND ADENOIDECTOMY     age 36  . TUBAL LIGATION  1986  . WISDOM TOOTH EXTRACTION     Social History   Social History Narrative   Widowed late 2019 after 40+ years of marriage   3 sons one daughter   Working as a Company secretary for [employer]   No/never tobacco no drug use 1 caffeinated beverage daily, occasional or rare glass of wine   family history includes COPD (age of onset: 52) in her brother; Emphysema in her father; Hypertension in her sister; Lung cancer (age of onset: 80) in her father; Stroke in her mother; Thyroid disease in her sister.   Review of Systems As per HPI, back pain chronic insomnia still grieving, allergies anxious.  Some muscle pains and some sweats during the day  All other review of systems negative.  Objective:   Physical Exam @BP  122/76   Pulse 84   Ht 5\' 2"  (1.575 m)   Wt 235 lb (106.6 kg)   LMP 12/12/2005   BMI 42.98 kg/m @  General:  Well-developed, well-nourished and in no acute distress Eyes:  anicteric. ENT:   Mouth and posterior pharynx free of lesions.  Neck:   supple w/o thyromegaly or mass.  Lungs: Clear to auscultation bilaterally. Heart:  S1S2, no rubs, murmurs, gallops. Abdomen:  soft, non-tender, no hepatosplenomegaly, hernia, or mass and BS+.  Rectal:  Caryl Pina LPN present  Slight tags Slight anal canal inflammation, not tender, soft stool tan, no mass and no rectocele   Lymph:  no cervical or supraclavicular adenopathy. Extremities:   no edema, cyanosis or clubbing Skin   no rash. Neuro:  A&O x 3.  Psych:  appropriate mood and  Affect.   Data Reviewed: See HPI

## 2018-12-28 NOTE — Patient Instructions (Signed)
You have been scheduled for a colonoscopy. Please follow written instructions given to you at your visit today.  Please pick up your prep supplies at the pharmacy within the next 1-3 days.   I appreciate the opportunity to care for you. Silvano Rusk, MD, [facility]

## 2018-12-31 ENCOUNTER — Other Ambulatory Visit: Payer: Self-pay | Admitting: Obstetrics and Gynecology

## 2018-12-31 ENCOUNTER — Other Ambulatory Visit: Payer: Self-pay

## 2018-12-31 DIAGNOSIS — Z1231 Encounter for screening mammogram for malignant neoplasm of breast: Secondary | ICD-10-CM

## 2019-01-03 ENCOUNTER — Encounter: Payer: Self-pay | Admitting: Internal Medicine

## 2019-01-06 DIAGNOSIS — G4733 Obstructive sleep apnea (adult) (pediatric): Secondary | ICD-10-CM | POA: Diagnosis not present

## 2019-01-07 ENCOUNTER — Other Ambulatory Visit: Payer: Self-pay | Admitting: Internal Medicine

## 2019-01-07 MED FILL — SHIPPING COST: 1 days supply | Qty: 1 | Fill #14

## 2019-01-07 MED FILL — MONTELUKAST SOD 10 MG TAB: 10 | 90 days supply | Qty: 90 | Fill #2

## 2019-01-07 MED FILL — ESTRADIOL 2 MG TABS: 2 | 30 days supply | Qty: 30 | Fill #1

## 2019-01-07 MED FILL — MOMETASONE FUROATE 50 MCG S: 50 | 30 days supply | Qty: 17 | Fill #0

## 2019-01-08 DIAGNOSIS — M6281 Muscle weakness (generalized): Secondary | ICD-10-CM | POA: Diagnosis not present

## 2019-01-08 DIAGNOSIS — R102 Pelvic and perineal pain: Secondary | ICD-10-CM | POA: Diagnosis not present

## 2019-01-08 DIAGNOSIS — N3946 Mixed incontinence: Secondary | ICD-10-CM | POA: Diagnosis not present

## 2019-01-08 DIAGNOSIS — M62838 Other muscle spasm: Secondary | ICD-10-CM | POA: Diagnosis not present

## 2019-01-08 DIAGNOSIS — R3915 Urgency of urination: Secondary | ICD-10-CM | POA: Diagnosis not present

## 2019-01-08 MED FILL — PANTOPRAZOLE SOD DR 40 MG T: 40 | 90 days supply | Qty: 180 | Fill #0

## 2019-01-09 MED FILL — SHIPPING COST: 1 days supply | Qty: 1 | Fill #15

## 2019-01-09 MED FILL — NP THYROID 90 MG TABLET: 90 | 30 days supply | Qty: 30 | Fill #1

## 2019-01-10 ENCOUNTER — Ambulatory Visit: Payer: 59 | Admitting: Internal Medicine

## 2019-01-10 ENCOUNTER — Encounter: Payer: Self-pay | Admitting: Internal Medicine

## 2019-01-10 ENCOUNTER — Other Ambulatory Visit: Payer: Self-pay | Admitting: *Deleted

## 2019-01-10 DIAGNOSIS — G4733 Obstructive sleep apnea (adult) (pediatric): Secondary | ICD-10-CM | POA: Diagnosis not present

## 2019-01-10 DIAGNOSIS — J45909 Unspecified asthma, uncomplicated: Secondary | ICD-10-CM | POA: Diagnosis not present

## 2019-01-10 NOTE — Patient Instructions (Signed)
We can continue CPAP auto 8-15, mask of choice, humidifier, supplies, AirView/ card  Please call if we can help

## 2019-01-10 NOTE — Progress Notes (Signed)
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 Allergy skin test- 01/14/15-positive for mixed grass pollens, fall weeds, several tree pollens, dust mite, feather, cat and some molds. PFT's 02/02/18 - no airflow obstruction FEV1 2.16 (94 % ) ratio 92 p no % improvement from saba p ? 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 CT chest for follow-up in September-[place]  ----OSA. Pt states the CPAP headgear is uncomfortable- put too much pressure on her cheeks. She states she wakes up many times in the night due to facial itching.  Proair, singulair,  CPAP auto 5-20/ [facility] Download 90% compliance, AHI 0.9/ hr Breathing ok- rare need for rescue inhaler w no wheeze. DOE appropriate for level of exertion and fitness.   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 restarted cpap in December, is doing well on it.   CPAP auto 8-15/ [place] Download 67% compliance AHI 3.8/hour Since last here husband had died from metastatic bone cancer and she is grieving.  She dropped CPAP during that interval for a while but has resumed. Download 67% compliance AHI 3.3/hour Recent viral syndrome tracheobronchitis is also resolving.  She does have family support.  ROS-see HPI + = positive Constitutional:   No-   weight loss, night sweats, fevers, chills,+ fatigue, lassitude. HEENT:   + headaches, difficulty swallowing, tooth/dental problems, sore throat,       No-  sneezing, itching, ear ache, nasal congestion, +post nasal drip,  CV:  No-   chest pain, orthopnea, PND, swelling in lower extremities, anasarca,                                  dizziness, palpitations Resp: + shortness of breath with exertion or at rest.                 productive cough,  No non-productive cough,  No- coughing up of blood.               change in color of mucus.  No- wheezing.   Skin: No-   rash or lesions. GI:  + heartburn, indigestion, abdominal pain, nausea, vomiting,  GU:  MS:  + joint pain or swelling.   Neuro-     nothing unusual Psych:  No- change in mood or affect. + depression or +anxiety.  No memory loss.    Objective:  OBJ- Physical Exam  .  General- Alert, Oriented, Affect+tearful, Distress-  + morbidly obese Skin- rash-none, lesions- none, excoriation- none Lymphadenopathy- none Head- atraumatic  Eyes- Gross vision intact, PERRLA, conjunctivae and secretions clear            Ears- Hearing, canals-normal            Nose- Clear, no-Septal dev, mucus, polyps, erosion, perforation             Throat- Mallampati III , mucosa clear , drainage- none, tonsils- atrophic, + hoarse Neck- flexible , trachea midline, no stridor , thyroid nl, carotid no bruit Chest - symmetrical excursion , unlabored           Heart/CV-  RRR , no murmur , no gallop  , no rub, nl s1 s2                           - JVD- none , edema- none, stasis changes- none, varices- none           Lung- clear to P&A, wheeze- none, cough- none , dullness-none, rub- none .                          +Shallow c/w body habitus           Chest wall-  Abd-  Br/ Gen/ Rectal- Not done, not indicated Extrem- cyanosis- none, clubbing, none, atrophy- none, strength- nl Neuro- grossly intact to observation     Assessment & Plan:

## 2019-01-14 ENCOUNTER — Encounter: Payer: Self-pay | Admitting: Internal Medicine

## 2019-01-14 ENCOUNTER — Ambulatory Visit (AMBULATORY_SURGERY_CENTER): Payer: 59 | Admitting: Internal Medicine

## 2019-01-14 VITALS — BP 162/74 | HR 56 | Temp 98.6°F | Resp 11 | Ht 62.0 in | Wt 232.0 lb

## 2019-01-14 DIAGNOSIS — R197 Diarrhea, unspecified: Secondary | ICD-10-CM

## 2019-01-14 DIAGNOSIS — K5730 Diverticulosis of large intestine without perforation or abscess without bleeding: Secondary | ICD-10-CM

## 2019-01-14 DIAGNOSIS — K6289 Other specified diseases of anus and rectum: Secondary | ICD-10-CM | POA: Diagnosis not present

## 2019-01-14 MED ORDER — SODIUM CHLORIDE 0.9 % IV SOLN: 500.0000 mL | Freq: Once | INTRAVENOUS | Status: DC

## 2019-01-14 NOTE — Addendum Note (Signed)
Addended by: Dossie Arbour on: 01/14/2019 02:42 PM   Modules accepted: Orders

## 2019-01-14 NOTE — Assessment & Plan Note (Signed)
Recent viral pattern exacerbation is resolving now.  She is not wheezing at this exam.  We discussed medication strategies.

## 2019-01-14 NOTE — Progress Notes (Signed)
Called to room to assist during endoscopic procedure.  Patient ID and intended procedure confirmed with present staff. Received instructions for my participation in the procedure from the performing physician.  

## 2019-01-14 NOTE — Patient Instructions (Addendum)
I saw diverticulosis but otherwise looked normal - I took biopsies to see if there is microscopic colitis and will let you know.  I appreciate the opportunity to care for you. Gatha Mayer, MD, [facility]  Diverticulosis handout given to patient.  Resume previous diet. Continue present medications.  Repeat colonoscopy recommended.  Date to be determined after pathology results reviewed.  YOU HAD AN ENDOSCOPIC PROCEDURE TODAY AT [facility]:   Refer to the procedure report that was given to you for any specific questions about what was found during the examination.  If the procedure report does not answer your questions, please call your gastroenterologist to clarify.  If you requested that your care partner not be given the details of your procedure findings, then the procedure report has been included in a sealed envelope for you to review at your convenience later.  YOU SHOULD EXPECT: Some feelings of bloating in the abdomen. Passage of more gas than usual.  Walking can help get rid of the air that was put into your GI tract during the procedure and reduce the bloating. If you had a lower endoscopy (such as a colonoscopy or flexible sigmoidoscopy) you may notice spotting of blood in your stool or on the toilet paper. If you underwent a bowel prep for your procedure, you may not have a normal bowel movement for a few days.  Please Note:  You might notice some irritation and congestion in your nose or some drainage.  This is from the oxygen used during your procedure.  There is no need for concern and it should clear up in a day or so.  SYMPTOMS TO REPORT IMMEDIATELY:   Following lower endoscopy (colonoscopy or flexible sigmoidoscopy):  Excessive amounts of blood in the stool  Significant tenderness or worsening of abdominal pains  Swelling of the abdomen that is new, acute  Fever of 100F or higher For urgent or emergent issues, a gastroenterologist can be reached at  any hour by calling [phone].   DIET:  We do recommend a small meal at first, but then you may proceed to your regular diet.  Drink plenty of fluids but you should avoid alcoholic beverages for 24 hours.  ACTIVITY:  You should plan to take it easy for the rest of today and you should NOT DRIVE or use heavy machinery until tomorrow (because of the sedation medicines used during the test).    FOLLOW UP: Our staff will call the number listed on your records the next business day following your procedure to check on you and address any questions or concerns that you may have regarding the information given to you following your procedure. If we do not reach you, we will leave a message.  However, if you are feeling well and you are not experiencing any problems, there is no need to return our call.  We will assume that you have returned to your regular daily activities without incident.  If any biopsies were taken you will be contacted by phone or by letter within the next 1-3 weeks.  Please call us at [phone] if you have not heard about the biopsies in 3 weeks.    SIGNATURES/CONFIDENTIALITY: You and/or your care partner have signed paperwork which will be entered into your electronic medical record.  These signatures attest to the fact that that the information above on your After Visit Summary has been reviewed and is understood.  Full responsibility of the confidentiality of this discharge  information lies with you and/or your care-partner. 

## 2019-01-14 NOTE — Progress Notes (Signed)
To PACU, VSS. Report to [email or web address] 

## 2019-01-14 NOTE — Op Note (Addendum)
[facility] Patient Name: [name] [name] Procedure Date: 01/14/2019 9:21 AM MRN: [id number] Endoscopist: Gatha Mayer , MD Age: 64 Referring MD:  Date of Birth: 1956/07/15 Gender: Female Account #: [id number] Procedure:                Colonoscopy Indications:              Clinically significant diarrhea of unexplained                            origin Medicines:                Propofol per Anesthesia, Monitored Anesthesia Care Procedure:                Pre-Anesthesia Assessment:                           - Prior to the procedure, a History and Physical                            was performed, and patient medications and                            allergies were reviewed. The patient's tolerance of                            previous anesthesia was also reviewed. The risks                            and benefits of the procedure and the sedation                            options and risks were discussed with the patient.                            All questions were answered, and informed consent                            was obtained. Prior Anticoagulants: The patient has                            taken no previous anticoagulant or antiplatelet                            agents. ASA Grade Assessment: III - A patient with                            severe systemic disease. After reviewing the risks                            and benefits, the patient was deemed in                            satisfactory condition to undergo the procedure.  After obtaining informed consent, the colonoscope                            was passed under direct vision. Throughout the                            procedure, the patient's blood pressure, pulse, and                            oxygen saturations were monitored continuously. The                            Colonoscope was introduced through the anus and                            advanced to the the cecum,  identified by                            appendiceal orifice and ileocecal valve. The                            colonoscopy was performed without difficulty. The                            patient tolerated the procedure well. The quality                            of the bowel preparation was good. The bowel                            preparation used was Miralax. The ileocecal valve,                            appendiceal orifice, and rectum were photographed. Scope In: 9:34:08 AM Scope Out: 9:52:03 AM Scope Withdrawal Time: 0 hours 14 minutes 56 seconds  Total Procedure Duration: 0 hours 17 minutes 55 seconds  Findings:                 The perianal and digital rectal examinations were                            normal.                           Multiple diverticula were found in the sigmoid                            colon.                           The exam was otherwise without abnormality on                            direct and retroflexion views.  Biopsies for histology were taken with a cold                            forceps from the right colon and left colon for                            evaluation of microscopic colitis. Complications:            No immediate complications. Estimated Blood Loss:     Estimated blood loss was minimal. Impression:               - Diverticulosis in the sigmoid colon.                           - The examination was otherwise normal on direct                            and retroflexion views. I could not enter terminal                            ileum but was able to open the IC valve some and                            mucosa normal.                           - Biopsies were taken with a cold forceps from the                            right colon and left colon for evaluation of                            microscopic colitis. Recommendation:           - Patient has a contact number available for                             emergencies. The signs and symptoms of potential                            delayed complications were discussed with the                            patient. Return to normal activities tomorrow.                            Written discharge instructions were provided to the                            patient.                           - Resume previous diet.                           - Continue present  medications.                           - Repeat colonoscopy is recommended. The                            colonoscopy date will be determined after pathology                            results from today's exam become available for                            review.                           If biopsies ok then will likely treat with prn                            loperamide Gatha Mayer, MD 01/14/2019 10:05:13 AM This report has been signed electronically.

## 2019-01-14 NOTE — Assessment & Plan Note (Signed)
Hopefully she will begin to address this issue in the next year.  She understands the medical concerns.

## 2019-01-14 NOTE — Assessment & Plan Note (Signed)
She dropped off CPAP during difficult time after husband's death but has restarted.  She does benefit with improved sleep.  She is resuming regular, consistent use. Plan continue CPAP auto 8-15

## 2019-01-15 ENCOUNTER — Telehealth: Payer: Self-pay

## 2019-01-15 DIAGNOSIS — R102 Pelvic and perineal pain: Secondary | ICD-10-CM | POA: Diagnosis not present

## 2019-01-15 DIAGNOSIS — N819 Female genital prolapse, unspecified: Secondary | ICD-10-CM | POA: Diagnosis not present

## 2019-01-15 DIAGNOSIS — M6281 Muscle weakness (generalized): Secondary | ICD-10-CM | POA: Diagnosis not present

## 2019-01-15 DIAGNOSIS — M62838 Other muscle spasm: Secondary | ICD-10-CM | POA: Diagnosis not present

## 2019-01-15 MED FILL — SHIPPING COST: 1 days supply | Qty: 1 | Fill #16

## 2019-01-15 MED FILL — traMADol HCL 50 MG TABS: 50 | 15 days supply | Qty: 30 | Fill #0

## 2019-01-15 NOTE — Telephone Encounter (Signed)
  Follow up Call-  Call back number 01/14/2019  Post procedure Call Back phone  # [phone]  Permission to leave phone message Yes  Some recent data might be hidden     Patient questions:  Do you have a fever, pain , or abdominal swelling? No. Pain Score  0 *  Have you tolerated food without any problems? Yes.    Have you been able to return to your normal activities? Yes.    Do you have any questions about your discharge instructions: Diet   No. Medications  No. Follow up visit  No.  Do you have questions or concerns about your Care? No.  Actions: * If pain score is 4 or above: No action needed, pain <4.

## 2019-01-21 DIAGNOSIS — R102 Pelvic and perineal pain: Secondary | ICD-10-CM | POA: Diagnosis not present

## 2019-01-21 DIAGNOSIS — M6281 Muscle weakness (generalized): Secondary | ICD-10-CM | POA: Diagnosis not present

## 2019-01-21 DIAGNOSIS — M62838 Other muscle spasm: Secondary | ICD-10-CM | POA: Diagnosis not present

## 2019-01-21 DIAGNOSIS — N3946 Mixed incontinence: Secondary | ICD-10-CM | POA: Diagnosis not present

## 2019-01-21 DIAGNOSIS — N819 Female genital prolapse, unspecified: Secondary | ICD-10-CM | POA: Diagnosis not present

## 2019-01-22 ENCOUNTER — Other Ambulatory Visit: Payer: Self-pay

## 2019-01-22 NOTE — Progress Notes (Signed)
[facility] - 10 year recall no letter  Office  Let her know colon biopsies are normal - no colitis  She needs to see cheryl or Kennyth Lose for pelvic floor PT due to pelvic floor dysfunction - urogynecologist has recommended and I also think it will help with her bowel issues please refer

## 2019-01-28 ENCOUNTER — Other Ambulatory Visit: Payer: Self-pay | Admitting: Psychiatry

## 2019-01-28 DIAGNOSIS — F418 Other specified anxiety disorders: Secondary | ICD-10-CM | POA: Insufficient documentation

## 2019-01-28 DIAGNOSIS — N3946 Mixed incontinence: Secondary | ICD-10-CM | POA: Diagnosis not present

## 2019-01-28 DIAGNOSIS — M6281 Muscle weakness (generalized): Secondary | ICD-10-CM | POA: Diagnosis not present

## 2019-01-28 DIAGNOSIS — M62838 Other muscle spasm: Secondary | ICD-10-CM | POA: Diagnosis not present

## 2019-01-28 DIAGNOSIS — R102 Pelvic and perineal pain: Secondary | ICD-10-CM | POA: Diagnosis not present

## 2019-01-28 DIAGNOSIS — N9489 Other specified conditions associated with female genital organs and menstrual cycle: Secondary | ICD-10-CM | POA: Insufficient documentation

## 2019-01-28 DIAGNOSIS — M6289 Other specified disorders of muscle: Secondary | ICD-10-CM | POA: Diagnosis not present

## 2019-01-28 DIAGNOSIS — R3915 Urgency of urination: Secondary | ICD-10-CM | POA: Diagnosis not present

## 2019-01-29 MED FILL — SHIPPING COST: 1 days supply | Qty: 1 | Fill #17

## 2019-01-29 MED FILL — ZOLPIDEM TARTRATE 10 MG TAB: 10 | 30 days supply | Qty: 30 | Fill #0

## 2019-01-30 ENCOUNTER — Ambulatory Visit: Payer: Self-pay

## 2019-01-30 DIAGNOSIS — R102 Pelvic and perineal pain: Secondary | ICD-10-CM | POA: Diagnosis not present

## 2019-01-30 DIAGNOSIS — N9489 Other specified conditions associated with female genital organs and menstrual cycle: Secondary | ICD-10-CM | POA: Diagnosis not present

## 2019-01-30 DIAGNOSIS — R8290 Unspecified abnormal findings in urine: Secondary | ICD-10-CM | POA: Diagnosis not present

## 2019-01-30 DIAGNOSIS — N8111 Cystocele, midline: Secondary | ICD-10-CM | POA: Diagnosis not present

## 2019-01-30 DIAGNOSIS — N816 Rectocele: Secondary | ICD-10-CM | POA: Diagnosis not present

## 2019-01-30 MED FILL — GABAPENTIN 300 MG CAPSULE: 300 | 30 days supply | Qty: 90 | Fill #0

## 2019-02-01 ENCOUNTER — Telehealth: Payer: Self-pay | Admitting: Internal Medicine

## 2019-02-01 NOTE — Telephone Encounter (Signed)
Pt states that since her colonoscopy she has been experiencing a lot of gasses and stomach distention, she has not been able to pass gas despite taking simethicone. She would like some advise.

## 2019-02-01 NOTE — Telephone Encounter (Signed)
Patient reports intermittent gas and bloating that causes "extreme pain at times".  She has tried gas -x with little success.  She is advised that she should start on a low residue diet and try IB GARD.  She wants to know what Dr. Carlean Purl thinks of her symptoms. She is s/p colonoscopy on 01/15/19 results were normal. She is having multiple loose stools a day.  She is terribly concerned about her symptoms and has been scheduled for 02/05/19

## 2019-02-02 NOTE — Telephone Encounter (Signed)
OK Agree w/ RN recommendations Will see her 2/25

## 2019-02-05 ENCOUNTER — Ambulatory Visit: Payer: Self-pay | Admitting: Internal Medicine

## 2019-02-05 DIAGNOSIS — M6289 Other specified disorders of muscle: Secondary | ICD-10-CM | POA: Diagnosis not present

## 2019-02-05 DIAGNOSIS — N3946 Mixed incontinence: Secondary | ICD-10-CM | POA: Diagnosis not present

## 2019-02-05 DIAGNOSIS — M6281 Muscle weakness (generalized): Secondary | ICD-10-CM | POA: Diagnosis not present

## 2019-02-05 DIAGNOSIS — N819 Female genital prolapse, unspecified: Secondary | ICD-10-CM | POA: Diagnosis not present

## 2019-02-05 DIAGNOSIS — M62838 Other muscle spasm: Secondary | ICD-10-CM | POA: Diagnosis not present

## 2019-02-07 MED FILL — SERTRALINE HCL 100 MG TAB: 100 | 30 days supply | Qty: 45 | Fill #1

## 2019-02-07 MED FILL — SHIPPING COST: 1 days supply | Qty: 1 | Fill #18

## 2019-02-07 MED FILL — traMADol HCL 50 MG TABS: 50 | 7 days supply | Qty: 14 | Fill #0

## 2019-02-08 MED FILL — SHIPPING COST: 1 days supply | Qty: 1 | Fill #19

## 2019-02-12 DIAGNOSIS — R5383 Other fatigue: Secondary | ICD-10-CM | POA: Diagnosis not present

## 2019-02-12 DIAGNOSIS — E669 Obesity, unspecified: Secondary | ICD-10-CM | POA: Diagnosis not present

## 2019-02-12 DIAGNOSIS — I10 Essential (primary) hypertension: Secondary | ICD-10-CM | POA: Diagnosis not present

## 2019-02-12 MED FILL — traMADol HCL 50 MG TABS: 50 | 15 days supply | Qty: 30 | Fill #0

## 2019-02-12 MED FILL — SHIPPING COST: 1 days supply | Qty: 1 | Fill #20

## 2019-02-12 MED FILL — NP THYROID 120 MG TABLET: 120 | 30 days supply | Qty: 30 | Fill #0

## 2019-02-12 MED FILL — GABAPENTIN 600 MG TABLET: 600 | 30 days supply | Qty: 90 | Fill #0

## 2019-02-14 DIAGNOSIS — M6289 Other specified disorders of muscle: Secondary | ICD-10-CM | POA: Diagnosis not present

## 2019-02-14 DIAGNOSIS — R102 Pelvic and perineal pain: Secondary | ICD-10-CM | POA: Diagnosis not present

## 2019-02-14 DIAGNOSIS — G8929 Other chronic pain: Secondary | ICD-10-CM | POA: Diagnosis not present

## 2019-02-14 DIAGNOSIS — F329 Major depressive disorder, single episode, unspecified: Secondary | ICD-10-CM | POA: Diagnosis not present

## 2019-02-14 MED FILL — SHIPPING COST: 1 days supply | Qty: 1 | Fill #21

## 2019-02-14 MED FILL — METHOCARBAMOL 500 MG TABLET: 500 | 10 days supply | Qty: 30 | Fill #0

## 2019-02-19 DIAGNOSIS — M62838 Other muscle spasm: Secondary | ICD-10-CM | POA: Diagnosis not present

## 2019-02-19 DIAGNOSIS — N3946 Mixed incontinence: Secondary | ICD-10-CM | POA: Diagnosis not present

## 2019-02-19 DIAGNOSIS — N819 Female genital prolapse, unspecified: Secondary | ICD-10-CM | POA: Diagnosis not present

## 2019-02-19 DIAGNOSIS — R102 Pelvic and perineal pain: Secondary | ICD-10-CM | POA: Diagnosis not present

## 2019-02-19 DIAGNOSIS — M6281 Muscle weakness (generalized): Secondary | ICD-10-CM | POA: Diagnosis not present

## 2019-02-20 ENCOUNTER — Ambulatory Visit
Admission: RE | Admit: 2019-02-20 | Discharge: 2019-02-20 | Disposition: A | Discharge status: Home / Self Care | Payer: 59 | Source: Ambulatory Visit | Attending: Obstetrics and Gynecology | Admitting: Obstetrics and Gynecology

## 2019-02-20 ENCOUNTER — Other Ambulatory Visit: Payer: Self-pay

## 2019-02-20 DIAGNOSIS — Z1231 Encounter for screening mammogram for malignant neoplasm of breast: Secondary | ICD-10-CM

## 2019-02-25 IMAGING — CT CT ANGIO CHEST
3 of 7 series · 18 of 36 positions shown · IV contrast (ISOVUE)
Comparison: 01/24/2018 CXR

CLINICAL DATA: Midsternal chest pain and dyspnea.

EXAM:
CT ANGIOGRAPHY CHEST WITH CONTRAST
TECHNIQUE: Multidetector CT imaging of the chest was performed using the
standard protocol during bolus administration of intravenous
contrast. Multiplanar CT image reconstructions and MIPs were
obtained to evaluate the vascular anatomy.
CONTRAST:  100mL 3CHKFH-Q4O IOPAMIDOL (3CHKFH-Q4O) INJECTION 76%

[Series 5: thins · axial · 0.71mm/px · z∈[-481,-245]mm · 14 of 274 slices shown]
[im 19/274  lung]
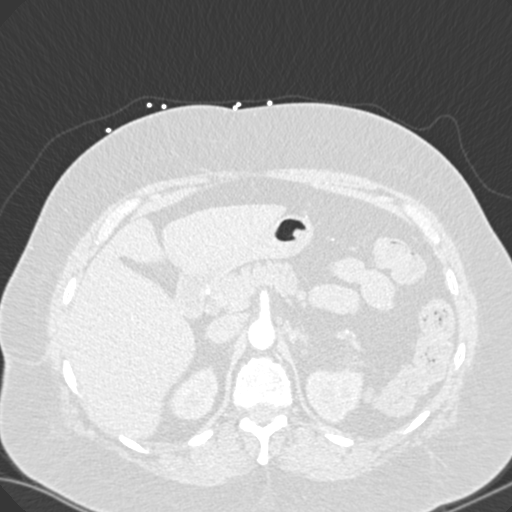
[im 37/274  mediastinal]
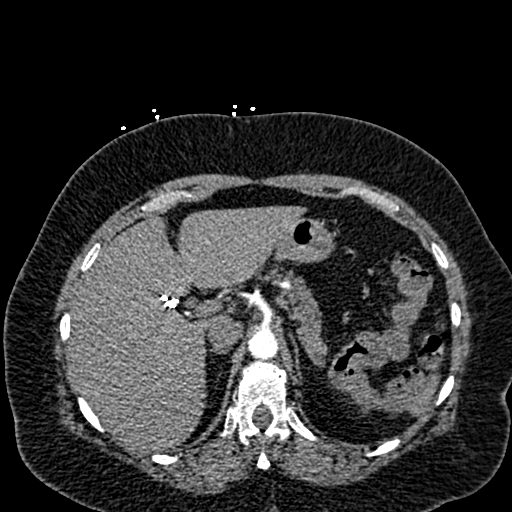
[im 55/274  lung]
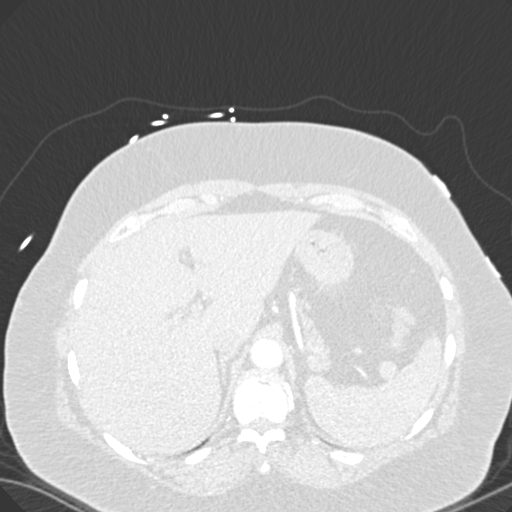
[im 73/274  mediastinal]
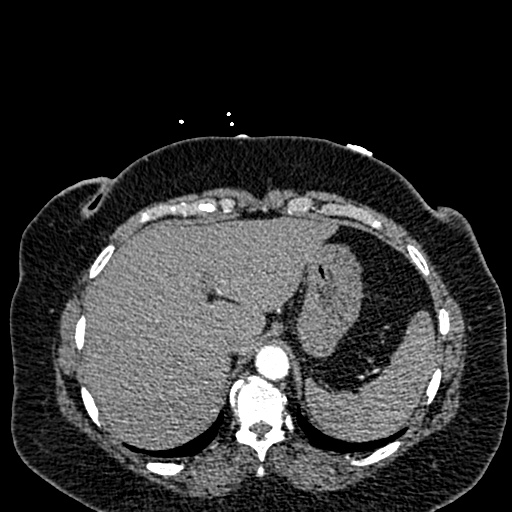
[im 92/274  lung]
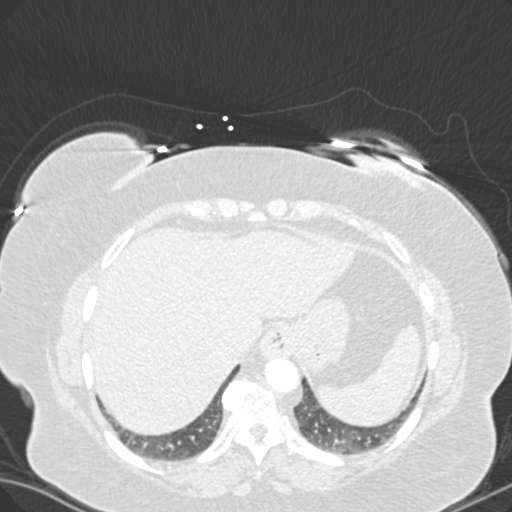
[im 110/274  mediastinal]
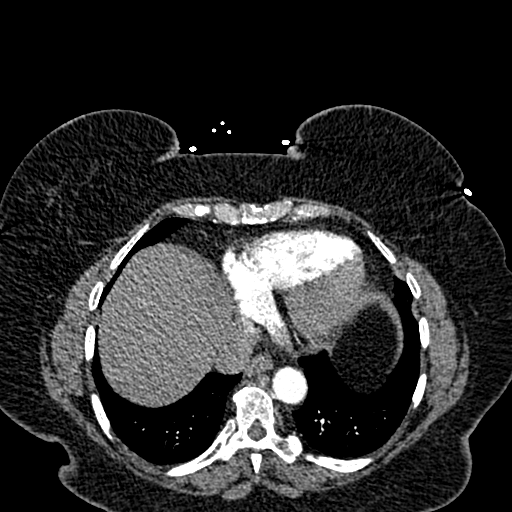
[im 128/274  lung]
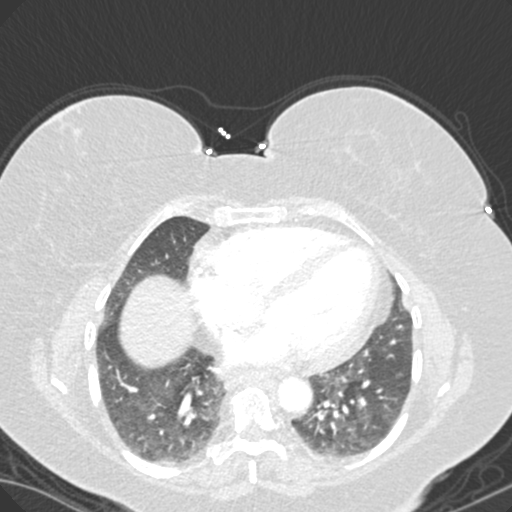
[im 146/274  mediastinal]
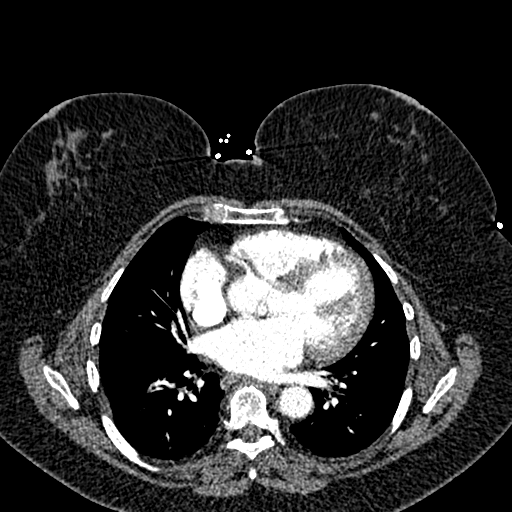
[im 164/274  lung]
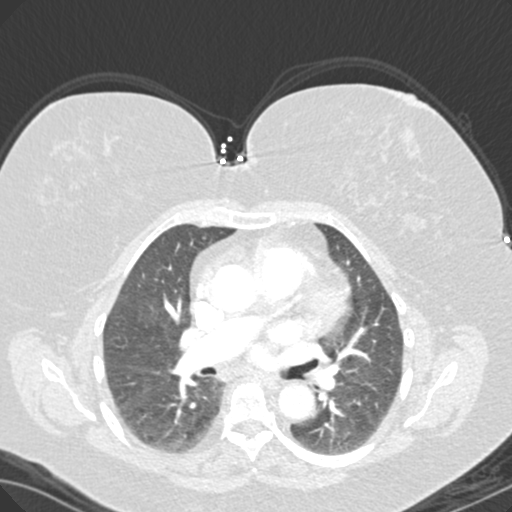
[im 183/274  mediastinal]
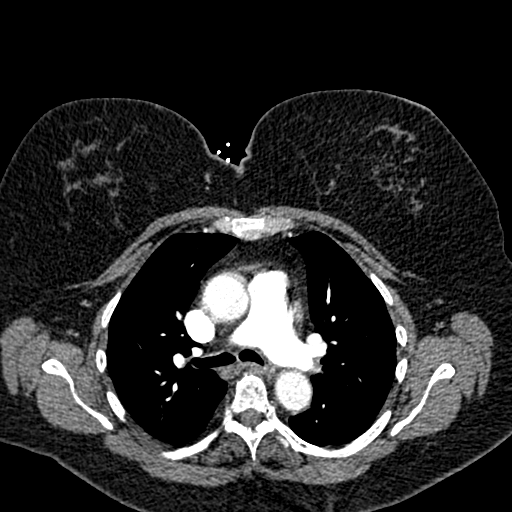
[im 201/274  lung]
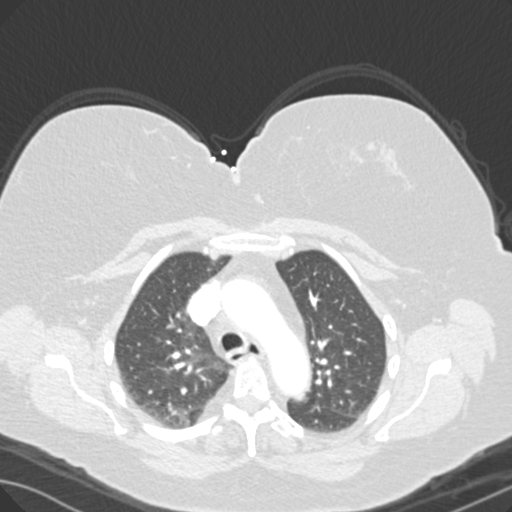
[im 219/274  mediastinal]
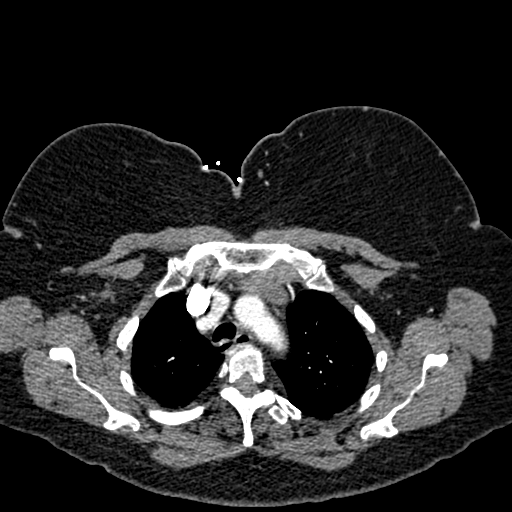
[im 237/274  lung]
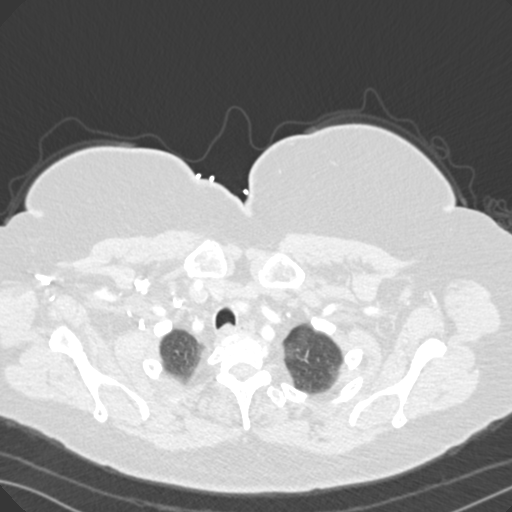
[im 255/274  mediastinal]
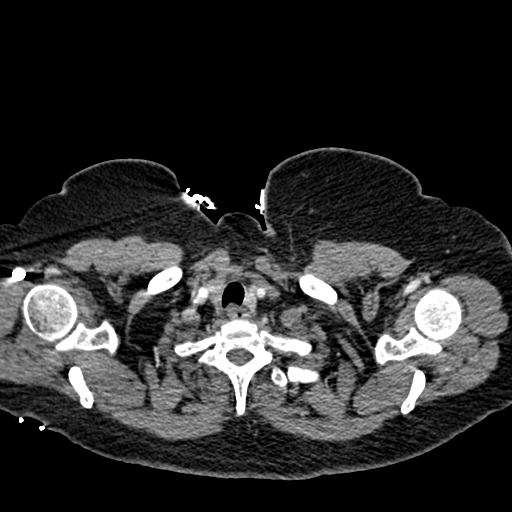

[Series 6: lung · axial · 0.70mm/px · z∈[-418,-342]mm · 3 of 116 slices shown]
[im 20/116  mediastinal]
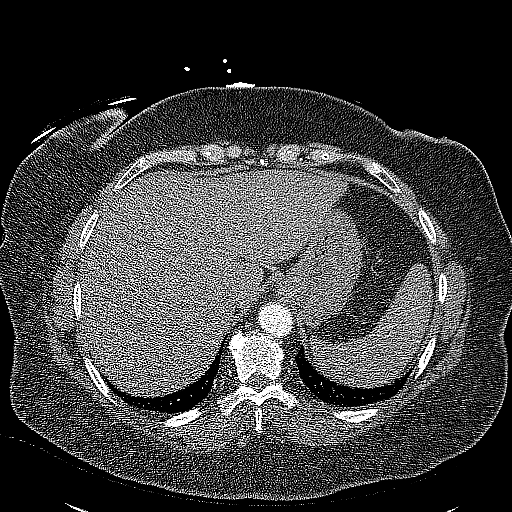
[im 39/116  mediastinal]
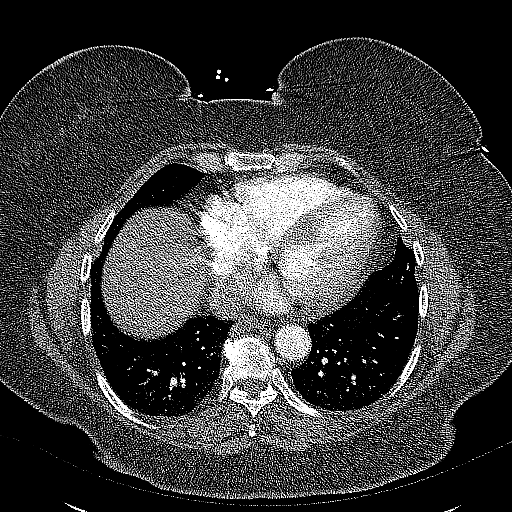
[im 58/116  mediastinal]
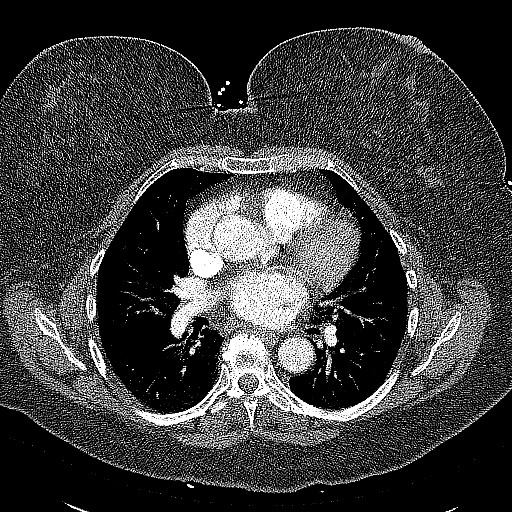

[Series 7: coronal mpr · coronal · 0.54mm/px · 1 of 162 slices shown]
[im 81/162  mediastinal]
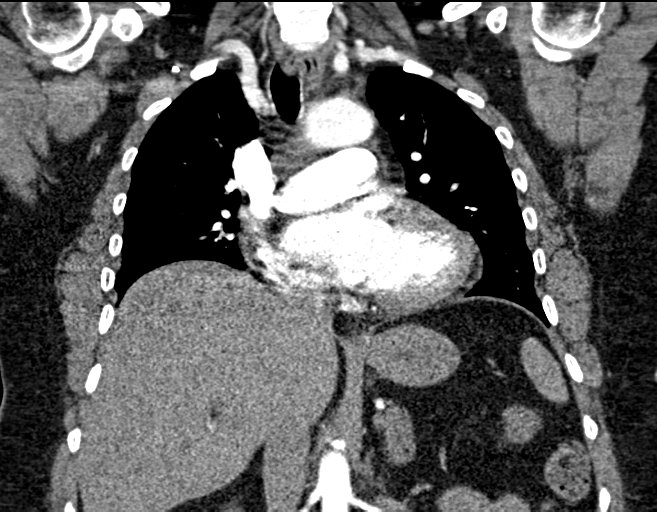

[18 of 36 positions shown; findings below may reference images not displayed]

FINDINGS: Cardiovascular: The study is of quality for the evaluation of
pulmonary embolism. There are no filling defects in the central,
lobar, segmental or subsegmental pulmonary artery branches to
suggest acute pulmonary embolism. Great vessels are normal in course
and caliber. Mild cardiomegaly. No significant pericardial
fluid/thickening. Common arterial trunk of the right brachiocephalic
and left common carotid arteries.

Mediastinum/Nodes: No discrete thyroid nodules. Unremarkable
esophagus. No pathologically enlarged axillary, mediastinal or hilar
lymph nodes.

Lungs/Pleura: No pneumothorax. No pleural effusion. Nonspecific mild
ground-glass opacities within both lungs possibly due to
hypoventilatory change, alveolitis or pneumonitis. Small focus of
tortuous vessels posterior segment right upper lobe.

Upper abdomen: Cholecystectomy. No acute abnormality within the
included upper abdomen.

Musculoskeletal:  No aggressive appearing focal osseous lesions.

Review of the MIP images confirms the above findings.
IMPRESSION: 1. No acute pulmonary embolus.
2. No aortic aneurysm or dissection.
3. Nonspecific mild ground-glass appearance of the lungs which may
represent hypoventilatory change or possibly an
alveolitis/pneumonitis.
4. Mild cardiomegaly.

## 2019-03-08 MED FILL — ZOLPIDEM TARTRATE 10 MG TAB: 10 | 30 days supply | Qty: 30 | Fill #1

## 2019-03-08 MED FILL — MOMETASONE FUROATE 50 MCG S: 50 | 30 days supply | Qty: 17 | Fill #0

## 2019-03-08 MED FILL — SERTRALINE HCL 100 MG TAB: 100 | 30 days supply | Qty: 45 | Fill #2

## 2019-03-08 MED FILL — BYSTOLIC 10 MG TABLET: 10 | 30 days supply | Qty: 60 | Fill #0

## 2019-03-15 MED FILL — NP THYROID 120 MG TABLET: 120 | 30 days supply | Qty: 30 | Fill #1

## 2019-03-15 MED FILL — traMADol HCL 50 MG TABS: 50 | 15 days supply | Qty: 30 | Fill #0

## 2019-03-20 ENCOUNTER — Ambulatory Visit (INDEPENDENT_AMBULATORY_CARE_PROVIDER_SITE_OTHER): Payer: 59 | Admitting: Psychiatry

## 2019-03-20 ENCOUNTER — Encounter: Payer: Self-pay | Admitting: Psychiatry

## 2019-03-20 ENCOUNTER — Other Ambulatory Visit: Payer: Self-pay

## 2019-03-20 DIAGNOSIS — F331 Major depressive disorder, recurrent, moderate: Secondary | ICD-10-CM | POA: Diagnosis not present

## 2019-03-20 DIAGNOSIS — F411 Generalized anxiety disorder: Secondary | ICD-10-CM | POA: Diagnosis not present

## 2019-03-20 DIAGNOSIS — F4001 Agoraphobia with panic disorder: Secondary | ICD-10-CM | POA: Diagnosis not present

## 2019-03-20 NOTE — Progress Notes (Signed)
[name] [name] [id number] [birth year] 63 y.o.  Subjective:   Patient ID:  [name] [name] is a 63 y.o. (DOB 1956/01/09) female.  Chief Complaint:  No chief complaint on file.   HPI [name] [name] presents to the office today for follow-up of anxiety.  Last seen December 19, 2018.  No meds were changed at that visit.  [name] [name] died in October 18, 2023 from cancer.  11 weeks ago.  Had a horrible day yesterday and cried all day.  Did work.  Had a good day today with friends.  Blessed to have supportive kids who helped a lot.    Wonderful service for her husband.  The hard part is she's 35 miles from her kids.  Sees them now weekly.  Very close to them.Really misses him.  D getting married in March.  Her 2 dogs help her a lot too.  Sleep with her now.  Married 41 years.  Is able to focus on positive memories.  Not ready to move.  During the time of H in Hospice didn't take Ativan and hasn't taken much since.  PCP started her on progesterone to help her sleep.  Real bad timewith grief.  She increased zoloft to 200 .  Thinks gabapentin helped her also given for pelvic pain.  Gyn wants her to take lorazepam daily bc of distress including from pain.  Ativan about 2-3 times weekly  Overall a lot better than 3 years ago.  Had been grieving his diagnosis all this time.  Thinks it's pretty normal.  Good support.  No panic attacks.  Sweats are not better. Despite cutting the dose by 50%.    Taking a whole Ambien and awakening once.  Went 6 weeks without it while H in Hospice.  Review of Systems:  Review of Systems  Constitutional: Positive for unexpected weight change.       Sweating  Genitourinary: Positive for pelvic pain.  Neurological: Negative for tremors and weakness.  Psychiatric/Behavioral: Positive for dysphoric mood. Negative for agitation, behavioral problems, confusion, decreased concentration, hallucinations, self-injury, sleep disturbance and suicidal ideas. The patient is  nervous/anxious. The patient is not hyperactive.     Medications: I have reviewed the patient's current medications.  Current Outpatient Medications  Medication Sig Dispense Refill  . albuterol (PROAIR HFA) 108 (90 BASE) MCG/ACT inhaler 2 puffs every 4 hours as needed only  if your can't catch your breath 1 Inhaler 1  . B Complex Vitamins (B-COMPLEX/B-12) LIQD Place 5 mLs under the tongue daily at 2 PM daily at 2 PM.    . celecoxib (CELEBREX) 200 MG capsule Take 200 mg by mouth daily as needed.   2  . Cholecalciferol (VITAMIN D) 2000 UNITS tablet Take 5,000 Units by mouth daily.     . diazepam (VALIUM) 10 MG tablet Take 1 tablet by mouth at bedtime. vaginally    . estradiol (ESTRACE) 2 MG tablet Take 2 mg by mouth daily.    . fexofenadine (ALLEGRA) 180 MG tablet Take 180 mg by mouth daily.    Marland Kitchen gabapentin (NEURONTIN) 600 MG tablet Take 600 mg by mouth 3 (three) times daily.    Marland Kitchen LORazepam (ATIVAN) 1 MG tablet Take 1 mg by mouth daily as needed.  0  . mometasone (NASONEX) 50 MCG/ACT nasal spray PLACE 2 SPRAYS INTO THE NOSE DAILY. 17 g 3  . montelukast (SINGULAIR) 10 MG tablet TAKE 1 TABLET BY MOUTH ONCE DAILY 90 tablet 0  . nebivolol (BYSTOLIC) 10 MG tablet Take  1 tablet (10 mg total) by mouth 2 (two) times daily. 60 tablet 0  . Nebulizers (NEBULIZER COMPRESSOR) MISC Use as directed (Patient not taking: Reported on 01/14/2019) 1 each 0  . pantoprazole (PROTONIX) 40 MG tablet Take 1 tablet (40 mg total) by mouth 2 (two) times daily before a meal. 60 tablet 5  . Probiotic Product (PROBIOTIC DAILY) CAPS Take 1 capsule by mouth daily.    . progesterone (PROMETRIUM) 200 MG capsule Take 400 mg by mouth daily.    . sertraline (ZOLOFT) 100 MG tablet Take 1/2 tablet, 1 tablet at bedtime (Patient taking differently: 200 mg. Take 1/2 tablet, 1 tablet at bedtime) 45 tablet 5  . thyroid (ARMOUR) 90 MG tablet Take 90 mg by mouth daily.    Marland Kitchen tiZANidine (ZANAFLEX) 4 MG tablet Take 1 tablet by mouth as needed.     . Travoprost, BAK Free, (TRAVATAN) 0.004 % SOLN ophthalmic solution Place 1 drop into both eyes at bedtime.    Marland Kitchen zolpidem (AMBIEN) 10 MG tablet TAKE 1/2-1 TABLET BY MOUTH AT BEDTIME AS NEEDED 30 tablet 1   No current facility-administered medications for this visit.     Medication Side Effects: Other: ? sweating related.  Allergies:  Allergies  Allergen Reactions  . Levaquin [Levofloxacin] Other (See Comments)    MUSCLE PAIN  . Nickel Rash    Past Medical History:  Diagnosis Date  . Allergy   . Anxiety   . Bronchiectasis   . Bronchitis    chronic  . 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 COPD (chronic obstructive pulmonary disease) ([place])   . Dental crown present    upper  . Depression   . Environmental allergies    receives allergy shots  . GERD (gastroesophageal reflux disease)   . 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 - no current med.  . Glucose intolerance (impaired glucose tolerance)    on Metformin  . H/O hiatal hernia    "sliding"  . History of echocardiogram    Echo 1/16: EF 60-65, no RWMA, Gr 1 DD, mild LAE, trivial pericardial eff post to heart  . History of echocardiogram    Echo 11/17: EF 55-60, no RWMA, Gr 1 DD, normal RV function.  Marland Kitchen History of palpitations    Holter 12/15: NSR, PACs  . Hx of migraines   . Hypothyroidism   . Osteoarthritis    knees  . Positional headache since 1997   if lies on left side    Family History  Problem Relation Age of Onset  . Lung cancer Father 50       dies age 60  . Emphysema Father   . COPD Brother 56       died age 18  . Stroke Mother   . Hypertension Sister   . Thyroid disease Sister        Graves Disease    Social History   Socioeconomic History  . Marital status: Widowed    Spouse name: Not on file  . Number of children: Not on file  . Years of education: Not on file  . Highest education level: Not on file  Occupational History  . Occupation: Risk analyst: [employer]  . Financial resource strain: Not on file  . Food insecurity:    Worry: Not on file    Inability: Not on file  . Transportation needs:    Medical: Not on  file    Non-medical: Not on file  Tobacco Use  . Smoking status: Never Smoker  . Smokeless tobacco: Never Used  Substance and Sexual Activity  . Alcohol use: Yes    Alcohol/week: 2.0 standard drinks    Types: 2 Standard drinks or equivalent per week    Comment: Twice a month   . Drug use: No  . Sexual activity: Yes    Partners: Male    Birth control/protection: Post-menopausal  Lifestyle  . Physical activity:    Days per week: Not on file    Minutes per session: Not on file  . Stress: Not on file  Relationships  . Social connections:    Talks on phone: Not on file    Gets together: Not on file    Attends religious service: Not on file    Active member of club or organization: Not on file    Attends meetings of clubs or organizations: Not on file    Relationship status: Not on file  . Intimate partner violence:    Fear of current or ex partner: Not on file    Emotionally abused: Not on file    Physically abused: Not on file    Forced sexual activity: Not on file  Other Topics Concern  . Not on file  Social History Narrative   Widowed late 2019 after 40+ years of marriage   3 sons one daughter   Working as a Company secretary for [employer]   No/never tobacco no drug use 1 caffeinated beverage daily, occasional or rare glass of wine    Past Medical History, Surgical history, Social history, and Family history were reviewed and updated as appropriate.   Please see review of systems for further details on the patient's review from today.   Objective:   Physical Exam:  LMP 12/12/2005   Physical Exam Neurological:     Mental Status: She is alert and oriented to person, place, and time.     Cranial Nerves: No dysarthria.  Psychiatric:        Attention and Perception: Attention normal.         Mood and Affect: Mood is anxious and depressed. Affect is tearful.        Speech: Speech normal.        Behavior: Behavior is cooperative.        Thought Content: Thought content normal. Thought content is not paranoid or delusional. Thought content does not include homicidal or suicidal ideation. Thought content does not include homicidal or suicidal plan.        Cognition and Memory: Cognition and memory normal.        Judgment: Judgment normal.     Comments: She is still dealing with a lot of grief with waves of depression and anxiety and at times easily overwhelmed but functional   Gained 100# while H sick.  Lab Review:     Component Value Date/Time   NA 139 01/31/2018 1813   K 3.8 01/31/2018 1813   CL 104 01/31/2018 1813   CO2 23 01/31/2018 1813   GLUCOSE 93 01/31/2018 1813   BUN 13 01/31/2018 1813   CREATININE 1.10 (H) 11/28/2018 1605   CALCIUM 9.3 01/31/2018 1813   PROT 6.7 02/05/2015 0850   ALBUMIN 4.4 02/05/2015 0850   AST 20 02/05/2015 0850   ALT 19 02/05/2015 0850   ALKPHOS 53 02/05/2015 0850   BILITOT 0.5 02/05/2015 0850   GFRNONAA >60 01/31/2018 1813   GFRAA >  60 01/31/2018 1813       Component Value Date/Time   WBC 10.2 03/23/2018 1244   RBC 4.32 03/23/2018 1244   HGB 13.5 03/23/2018 1244   HCT 40.0 03/23/2018 1244   PLT 406.0 (H) 03/23/2018 1244   MCV 92.6 03/23/2018 1244   MCH 31.6 01/31/2018 1813   MCHC 33.9 03/23/2018 1244   RDW 13.9 03/23/2018 1244   LYMPHSABS 2.5 03/23/2018 1244   MONOABS 0.6 03/23/2018 1244   EOSABS 0.1 03/23/2018 1244   BASOSABS 0.1 03/23/2018 1244    No results found for: POCLITH, LITHIUM   No results found for: PHENYTOIN, PHENOBARB, VALPROATE, CBMZ   .res Assessment: Plan:    Major depressive disorder, recurrent episode, moderate (HCC)  Panic disorder with agoraphobia  Generalized anxiety disorder   Disc the difference between depression and grief.  Her H would not talk about his cancer and wouldn't prepare her  for his death. Supportive and grief work.    She is still dealing with a lot of grief with waves of depression and anxiety and at times easily overwhelmed but functional.  I agree with the decision to increase sertraline back to 200 mg daily  Her panic disorder is generally controlled.  She is not using the lorazepam excessively.  She is taking it appropriately.  The diazepam is prescribed vaginally by her gynecologist for chronic pelvic pain. We discussed the short-term risks associated with benzodiazepines including sedation and increased fall risk among others.  Discussed long-term side effect risk including dependence, potential withdrawal symptoms, and the potential eventual dose-related risk of dementia.  Disc the risk of Ambien amnesia.  I connected with patient by a video enabled telemedicine application or telephone, with their informed consent, and verified patient privacy and that I am speaking with the correct person using two identifiers.  I was located at office and patient at home.   Lynder Parents, MD, DFAPA       Please see After Visit Summary for patient specific instructions.  Future Appointments  Date Time Provider [place]  05/15/2019  4:40 PM Doree Albee, MD [place] None    No orders of the defined types were placed in this encounter.     -------------------------------

## 2019-03-26 MED FILL — GABAPENTIN 600 MG TABLET: 600 | 30 days supply | Qty: 90 | Fill #1

## 2019-03-26 MED FILL — CELECOXIB 200 MG CAPSULE: 200 | 30 days supply | Qty: 30 | Fill #0

## 2019-03-28 DIAGNOSIS — M6289 Other specified disorders of muscle: Secondary | ICD-10-CM | POA: Diagnosis not present

## 2019-04-04 MED FILL — DIAZEPAM 10 MG TABS: 10 | 30 days supply | Qty: 30 | Fill #0

## 2019-04-09 ENCOUNTER — Other Ambulatory Visit: Payer: Self-pay | Admitting: Psychiatry

## 2019-04-09 ENCOUNTER — Other Ambulatory Visit: Payer: Self-pay

## 2019-04-09 MED ORDER — SERTRALINE HCL 100 MG PO TABS: ORAL_TABLET | ORAL | 0 refills | Status: DC

## 2019-04-09 MED FILL — BYSTOLIC 10 MG TABLET: 10 | 30 days supply | Qty: 60 | Fill #1

## 2019-04-09 MED FILL — ZOLPIDEM TARTRATE 10 MG TAB: 10 | 30 days supply | Qty: 30 | Fill #0

## 2019-04-09 MED FILL — MOMETASONE FUROATE 50 MCG S: 50 | 30 days supply | Qty: 17 | Fill #1

## 2019-04-09 MED FILL — TRAVOPROST (BAK FREE) 0.004: 0.004 | 30 days supply | Qty: 3 | Fill #1

## 2019-04-09 MED FILL — SERTRALINE HCL 100 MG TAB: 100 | 90 days supply | Qty: 180 | Fill #0

## 2019-04-09 NOTE — Telephone Encounter (Signed)
Pt called need refill on Zoloft to reflect change 100 mg in AM and 100 mg PM. [place]

## 2019-04-16 DIAGNOSIS — F419 Anxiety disorder, unspecified: Secondary | ICD-10-CM | POA: Diagnosis not present

## 2019-04-18 MED FILL — traMADol HCL 50 MG TABS: 50 | 15 days supply | Qty: 30 | Fill #0

## 2019-04-26 MED FILL — NP THYROID 120 MG TABLET: 120 | 30 days supply | Qty: 30 | Fill #2

## 2019-04-28 MED FILL — DIAZEPAM 10 MG TABS: 10 | 10 days supply | Qty: 30 | Fill #0

## 2019-04-29 DIAGNOSIS — F419 Anxiety disorder, unspecified: Secondary | ICD-10-CM | POA: Diagnosis not present

## 2019-05-01 DIAGNOSIS — F4321 Adjustment disorder with depressed mood: Secondary | ICD-10-CM | POA: Diagnosis not present

## 2019-05-01 DIAGNOSIS — R102 Pelvic and perineal pain: Secondary | ICD-10-CM | POA: Diagnosis not present

## 2019-05-01 DIAGNOSIS — N9489 Other specified conditions associated with female genital organs and menstrual cycle: Secondary | ICD-10-CM | POA: Diagnosis not present

## 2019-05-01 DIAGNOSIS — N816 Rectocele: Secondary | ICD-10-CM | POA: Insufficient documentation

## 2019-05-01 DIAGNOSIS — N8111 Cystocele, midline: Secondary | ICD-10-CM | POA: Insufficient documentation

## 2019-05-01 DIAGNOSIS — K6289 Other specified diseases of anus and rectum: Secondary | ICD-10-CM | POA: Diagnosis not present

## 2019-05-06 MED FILL — DIAZEPAM 10 MG TABS: 10 | 13 days supply | Qty: 40 | Fill #0

## 2019-05-07 MED FILL — traMADol HCL 50 MG TABS: 50 | 10 days supply | Qty: 20 | Fill #0

## 2019-05-09 MED FILL — GABAPENTIN 600 MG TABLET: 600 | 30 days supply | Qty: 90 | Fill #2

## 2019-05-10 DIAGNOSIS — H40023 Open angle with borderline findings, high risk, bilateral: Secondary | ICD-10-CM | POA: Diagnosis not present

## 2019-05-10 DIAGNOSIS — H25813 Combined forms of age-related cataract, bilateral: Secondary | ICD-10-CM | POA: Diagnosis not present

## 2019-05-14 DIAGNOSIS — F419 Anxiety disorder, unspecified: Secondary | ICD-10-CM | POA: Diagnosis not present

## 2019-05-14 MED FILL — ZOLPIDEM TARTRATE 10 MG TAB: 10 | 30 days supply | Qty: 30 | Fill #1

## 2019-05-15 ENCOUNTER — Ambulatory Visit (INDEPENDENT_AMBULATORY_CARE_PROVIDER_SITE_OTHER): Payer: Self-pay | Admitting: Internal Medicine

## 2019-05-16 MED FILL — CELECOXIB 200 MG CAP: 200 | 30 days supply | Qty: 30 | Fill #0

## 2019-05-17 MED FILL — BYSTOLIC 10 MG TABLET: 10 | 30 days supply | Qty: 60 | Fill #2

## 2019-05-20 DIAGNOSIS — R102 Pelvic and perineal pain: Secondary | ICD-10-CM | POA: Diagnosis not present

## 2019-05-20 DIAGNOSIS — R5383 Other fatigue: Secondary | ICD-10-CM | POA: Diagnosis not present

## 2019-05-20 DIAGNOSIS — E559 Vitamin D deficiency, unspecified: Secondary | ICD-10-CM | POA: Diagnosis not present

## 2019-05-20 DIAGNOSIS — I10 Essential (primary) hypertension: Secondary | ICD-10-CM | POA: Diagnosis not present

## 2019-05-20 DIAGNOSIS — E669 Obesity, unspecified: Secondary | ICD-10-CM | POA: Diagnosis not present

## 2019-05-20 MED FILL — GABAPENTIN 800 MG TABS: 800 | 30 days supply | Qty: 120 | Fill #0

## 2019-05-27 DIAGNOSIS — F419 Anxiety disorder, unspecified: Secondary | ICD-10-CM | POA: Diagnosis not present

## 2019-05-28 DIAGNOSIS — N816 Rectocele: Secondary | ICD-10-CM | POA: Diagnosis not present

## 2019-05-28 DIAGNOSIS — N9489 Other specified conditions associated with female genital organs and menstrual cycle: Secondary | ICD-10-CM | POA: Diagnosis not present

## 2019-05-28 DIAGNOSIS — N8111 Cystocele, midline: Secondary | ICD-10-CM | POA: Diagnosis not present

## 2019-05-28 DIAGNOSIS — R102 Pelvic and perineal pain: Secondary | ICD-10-CM | POA: Diagnosis not present

## 2019-05-30 MED FILL — NP THYROID 120 MG TABLET: 120 | 30 days supply | Qty: 30 | Fill #3

## 2019-06-06 DIAGNOSIS — R102 Pelvic and perineal pain: Secondary | ICD-10-CM | POA: Diagnosis not present

## 2019-06-06 DIAGNOSIS — G8929 Other chronic pain: Secondary | ICD-10-CM | POA: Diagnosis not present

## 2019-06-06 DIAGNOSIS — M7072 Other bursitis of hip, left hip: Secondary | ICD-10-CM | POA: Diagnosis not present

## 2019-06-06 DIAGNOSIS — E669 Obesity, unspecified: Secondary | ICD-10-CM | POA: Diagnosis not present

## 2019-06-06 DIAGNOSIS — M7918 Myalgia, other site: Secondary | ICD-10-CM | POA: Diagnosis not present

## 2019-06-12 DIAGNOSIS — F419 Anxiety disorder, unspecified: Secondary | ICD-10-CM | POA: Diagnosis not present

## 2019-06-17 MED FILL — MOMETASONE FUROATE 50 MCG S: 50 | 30 days supply | Qty: 17 | Fill #0

## 2019-06-17 MED FILL — ZOLPIDEM TARTRATE 10 MG TAB: 10 | 30 days supply | Qty: 30 | Fill #2

## 2019-06-17 MED FILL — PANTOPRAZOLE SOD DR 40 MG T: 40 | 30 days supply | Qty: 60 | Fill #0

## 2019-07-04 DIAGNOSIS — G4489 Other headache syndrome: Secondary | ICD-10-CM | POA: Diagnosis not present

## 2019-07-04 DIAGNOSIS — I10 Essential (primary) hypertension: Secondary | ICD-10-CM | POA: Diagnosis not present

## 2019-07-04 MED FILL — HYDROCHLOROTHIAZIDE 12.5 MG: 12.5 | 90 days supply | Qty: 90 | Fill #0

## 2019-07-05 DIAGNOSIS — F419 Anxiety disorder, unspecified: Secondary | ICD-10-CM | POA: Diagnosis not present

## 2019-07-16 MED FILL — ZOLPIDEM TARTRATE 10 MG TAB: 10 | 30 days supply | Qty: 30 | Fill #3

## 2019-07-17 MED FILL — MONTELUKAST SOD 10 MG TAB: 10 | 90 days supply | Qty: 90 | Fill #0

## 2019-07-17 MED FILL — BYSTOLIC 10 MG TABLET: 10 | 30 days supply | Qty: 60 | Fill #0

## 2019-07-17 MED FILL — PANTOPRAZOLE SOD DR 40 MG T: 40 | 30 days supply | Qty: 60 | Fill #1

## 2019-07-18 ENCOUNTER — Encounter: Payer: Self-pay | Admitting: Psychiatry

## 2019-07-18 ENCOUNTER — Ambulatory Visit: Payer: 59 | Admitting: Psychiatry

## 2019-07-18 ENCOUNTER — Other Ambulatory Visit: Payer: Self-pay

## 2019-07-18 DIAGNOSIS — F331 Major depressive disorder, recurrent, moderate: Secondary | ICD-10-CM

## 2019-07-18 DIAGNOSIS — F5105 Insomnia due to other mental disorder: Secondary | ICD-10-CM | POA: Diagnosis not present

## 2019-07-18 DIAGNOSIS — F4001 Agoraphobia with panic disorder: Secondary | ICD-10-CM | POA: Diagnosis not present

## 2019-07-18 DIAGNOSIS — F4321 Adjustment disorder with depressed mood: Secondary | ICD-10-CM | POA: Diagnosis not present

## 2019-07-18 DIAGNOSIS — F411 Generalized anxiety disorder: Secondary | ICD-10-CM | POA: Diagnosis not present

## 2019-07-18 MED ORDER — LORAZEPAM 1 MG PO TABS: 1.0000 mg | ORAL_TABLET | Freq: Every day | ORAL | 0 refills | Status: DC | PRN

## 2019-07-18 MED FILL — NP THYROID 120 MG TABLET: 120 | 30 days supply | Qty: 30 | Fill #0

## 2019-07-18 MED FILL — LORazepam 1 MG TABS: 1 | 30 days supply | Qty: 30 | Fill #0

## 2019-07-18 NOTE — Progress Notes (Signed)
[name] [name] [id number] 1956/04/06 63 y.o.  Subjective:   Patient ID:  [name] [name] is a 63 y.o. (DOB November 18, 1956) female.  Chief Complaint:  Chief Complaint  Patient presents with  . Follow-up    Medication Management  . Depression    Medication Management  . Medication Refill    Ativian   . Anxiety    Depression        Associated symptoms include no decreased concentration and no suicidal ideas. Medication Refill Pertinent negatives include no weakness.   [name] [name] presents to the office today for follow-up of anxiety.  Last seen April 2020.  She was dealing with grief and depression and sertraline was increased back to 200 mg daily.  [name] [name] died in 10/12/2023 from cancer.  Married 41 years.  Is able to focus on positive memories.  Not ready to move.  Thinks had downward spiral with heat and not being able to get out.  More depressed days. Low energy.  Can't handle heat.  Neglecting things.  Few visitiors.  Boys mow her place.  D invited her to dinner last week.  Low motivation and energy.  Normal grief path.  Some anger with it too.  Always been angry with [name] [name] and [name] [name] and they were'nt there until Hospice.    Gaining weight and not doing anything about it.  Overall a lot better than 3 years ago.  Had been grieving his diagnosis all this time.  Thinks it's pretty normal.  Good support.  No panic attacks.  Sweats are not better. Despite cutting the dose by 50%.    Taking a whole Ambien and awakening once.  Went 6 weeks without it while H in Hospice.   Review of Systems:  Review of Systems  Constitutional: Positive for unexpected weight change.       Sweating  Genitourinary: Negative for pelvic pain.  Neurological: Negative for tremors and weakness.  Psychiatric/Behavioral: Positive for depression and dysphoric mood. Negative for agitation, behavioral problems, confusion, decreased concentration, hallucinations, self-injury, sleep disturbance and  suicidal ideas. The patient is nervous/anxious. The patient is not hyperactive.     Medications: I have reviewed the patient's current medications.  Current Outpatient Medications  Medication Sig Dispense Refill  . albuterol (PROAIR HFA) 108 (90 BASE) MCG/ACT inhaler 2 puffs every 4 hours as needed only  if your can't catch your breath 1 Inhaler 1  . [name] Complex Vitamins ([name]-COMPLEX/[name]-12) LIQD Place 5 mLs under the tongue daily at 2 PM daily at 2 PM.    . celecoxib (CELEBREX) 200 MG capsule Take 200 mg by mouth daily as needed.   2  . Cholecalciferol (VITAMIN D) 2000 UNITS tablet Take 5,000 Units by mouth daily.     . diazepam (VALIUM) 10 MG tablet Take 1 tablet by mouth at bedtime. vaginally    . fexofenadine (ALLEGRA) 180 MG tablet Take 180 mg by mouth daily.    Marland Kitchen gabapentin (NEURONTIN) 600 MG tablet Take 600 mg by mouth 3 (three) times daily.    . hydrochlorothiazide (HYDRODIURIL) 12.5 MG tablet     . LORazepam (ATIVAN) 1 MG tablet Take 1 tablet (1 mg total) by mouth daily as needed. 30 tablet 0  . mometasone (NASONEX) 50 MCG/ACT nasal spray PLACE 2 SPRAYS INTO THE NOSE DAILY. 17 g 3  . montelukast (SINGULAIR) 10 MG tablet TAKE 1 TABLET BY MOUTH ONCE DAILY 90 tablet 0  . nebivolol (BYSTOLIC) 10 MG tablet Take 1  tablet (10 mg total) by mouth 2 (two) times daily. 60 tablet 0  . pantoprazole (PROTONIX) 40 MG tablet Take 1 tablet (40 mg total) by mouth 2 (two) times daily before a meal. 60 tablet 5  . sertraline (ZOLOFT) 100 MG tablet Take 1 tablet twice a day 180 tablet 0  . thyroid (ARMOUR) 90 MG tablet Take 120 mg by mouth daily.     Marland Kitchen tiZANidine (ZANAFLEX) 4 MG tablet Take 1 tablet by mouth as needed.    . Travoprost, BAK Free, (TRAVATAN) 0.004 % SOLN ophthalmic solution Place 1 drop into both eyes at bedtime.    Marland Kitchen zolpidem (AMBIEN) 10 MG tablet TAKE 1/2 TO 1 TABLET BY MOUTH AT BEDTIME AS NEEDED 30 tablet 3   No current facility-administered medications for this visit.     Medication  Side Effects: Other: ? sweating related.  Allergies:  Allergies  Allergen Reactions  . Levaquin [Levofloxacin] Other (See Comments)    MUSCLE PAIN  . Nickel Rash    Past Medical History:  Diagnosis Date  . Allergy   . Anxiety   . Bronchiectasis   . Bronchitis    chronic  . 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 COPD (chronic obstructive pulmonary disease) ([place])   . Dental crown present    upper  . Depression   . Environmental allergies    receives allergy shots  . GERD (gastroesophageal reflux disease)   . 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 - no current med.  . Glucose intolerance (impaired glucose tolerance)    on Metformin  . H/O hiatal hernia    "sliding"  . History of echocardiogram    Echo 1/16: EF 60-65, no RWMA, Gr 1 DD, mild LAE, trivial pericardial eff post to heart  . History of echocardiogram    Echo 11/17: EF 55-60, no RWMA, Gr 1 DD, normal RV function.  Marland Kitchen History of palpitations    Holter 12/15: NSR, PACs  . Hx of migraines   . Hypothyroidism   . Osteoarthritis    knees  . Positional headache since 1997   if lies on left side    Family History  Problem Relation Age of Onset  . Lung cancer Father 9       dies age 91  . Emphysema Father   . COPD Brother 68       died age 64  . Stroke Mother   . Hypertension Sister   . Thyroid disease Sister        Graves Disease    Social History   Socioeconomic History  . Marital status: Widowed    Spouse name: Not on file  . Number of children: Not on file  . Years of education: Not on file  . Highest education level: Not on file  Occupational History  . Occupation: Risk analyst: [employer]  . Financial resource strain: Not on file  . Food insecurity    Worry: Not on file    Inability: Not on file  . Transportation needs    Medical: Not on file    Non-medical: Not on file  Tobacco Use  . Smoking status: Never Smoker  . Smokeless tobacco: Never Used   Substance and Sexual Activity  . Alcohol use: Yes    Alcohol/week: 2.0 standard drinks    Types: 2 Standard drinks or equivalent per week    Comment: Twice a month   .  Drug use: No  . Sexual activity: Yes    Partners: Male    Birth control/protection: Post-menopausal  Lifestyle  . Physical activity    Days per week: Not on file    Minutes per session: Not on file  . Stress: Not on file  Relationships  . Social Herbalist on phone: Not on file    Gets together: Not on file    Attends religious service: Not on file    Active member of club or organization: Not on file    Attends meetings of clubs or organizations: Not on file    Relationship status: Not on file  . Intimate partner violence    Fear of current or ex partner: Not on file    Emotionally abused: Not on file    Physically abused: Not on file    Forced sexual activity: Not on file  Other Topics Concern  . Not on file  Social History Narrative   Widowed late 2019 after 40+ years of marriage   3 sons one daughter   Working as a Company secretary for [employer]   No/never tobacco no drug use 1 caffeinated beverage daily, occasional or rare glass of wine    Past Medical History, Surgical history, Social history, and Family history were reviewed and updated as appropriate.   Please see review of systems for further details on the patient's review from today.   Objective:   Physical Exam:  LMP 12/12/2005   Physical Exam Constitutional:      General: She is not in acute distress.    Appearance: She is well-developed. She is obese.  Musculoskeletal:        General: No deformity.  Neurological:     Mental Status: She is alert and oriented to person, place, and time.     Cranial Nerves: No dysarthria.     Coordination: Coordination normal.  Psychiatric:        Attention and Perception: Attention and perception normal. She does not perceive auditory or visual hallucinations.        Mood and  Affect: Mood is anxious. Mood is not depressed. Affect is not labile, blunt, angry, tearful or inappropriate.        Speech: Speech normal.        Behavior: Behavior normal. Behavior is cooperative.        Thought Content: Thought content normal. Thought content is not paranoid or delusional. Thought content does not include homicidal or suicidal ideation. Thought content does not include homicidal or suicidal plan.        Cognition and Memory: Cognition and memory normal.        Judgment: Judgment normal.   Gained 100# while H sick.  Lab Review:     Component Value Date/Time   NA 139 01/31/2018 1813   K 3.8 01/31/2018 1813   CL 104 01/31/2018 1813   CO2 23 01/31/2018 1813   GLUCOSE 93 01/31/2018 1813   BUN 13 01/31/2018 1813   CREATININE 1.10 (H) 11/28/2018 1605   CALCIUM 9.3 01/31/2018 1813   PROT 6.7 02/05/2015 0850   ALBUMIN 4.4 02/05/2015 0850   AST 20 02/05/2015 0850   ALT 19 02/05/2015 0850   ALKPHOS 53 02/05/2015 0850   BILITOT 0.5 02/05/2015 0850   GFRNONAA >60 01/31/2018 1813   GFRAA >60 01/31/2018 1813       Component Value Date/Time   WBC 10.2 03/23/2018 1244   RBC 4.32 03/23/2018 1244  HGB 13.5 03/23/2018 1244   HCT 40.0 03/23/2018 1244   PLT 406.0 (H) 03/23/2018 1244   MCV 92.6 03/23/2018 1244   MCH 31.6 01/31/2018 1813   MCHC 33.9 03/23/2018 1244   RDW 13.9 03/23/2018 1244   LYMPHSABS 2.5 03/23/2018 1244   MONOABS 0.6 03/23/2018 1244   EOSABS 0.1 03/23/2018 1244   BASOSABS 0.1 03/23/2018 1244    No results found for: POCLITH, LITHIUM   No results found for: PHENYTOIN, PHENOBARB, VALPROATE, CBMZ   .res Assessment: Plan:    Santoria was seen today for follow-up, depression, medication refill and anxiety.  Diagnoses and all orders for this visit:  Panic disorder with agoraphobia  Generalized anxiety disorder  Major depressive disorder, recurrent episode, moderate ([place])  Insomnia due to mental condition  Grief  Other orders -      LORazepam (ATIVAN) 1 MG tablet; Take 1 tablet (1 mg total) by mouth daily as needed.  Supportive therapy. Disc the difference between depression and grief.  Her H would not talk about his cancer and wouldn't prepare her for his death. Supportive and grief work.  Rec therapeutic letter writing.  Angry with his family.  She wants [name] [name] to take the ashes, but he refuses.  She is still dealing with a lot of grief with waves of depression and anxiety and at times easily overwhelmed but functional.  Continue sertraline back 200 mg daily  Her panic disorder is generally controlled.  She is not using the lorazepam excessively.  She is taking it appropriately.  The diazepam is prescribed vaginally by her gynecologist for chronic pelvic pain and not being used. We discussed the short-term risks associated with benzodiazepines including sedation and increased fall risk among others.  Discussed long-term side effect risk including dependence, potential withdrawal symptoms, and the potential eventual dose-related risk of dementia.  Disc the risk of Ambien amnesia.  Seeing pain psychologist every other week.  Referred for vaginal/rectal pain doctor..  Pain resolved after injection.  This appt was 30 mins.  FU 4-6 mos  Lynder Parents, MD, DFAPA  Please see After Visit Summary for patient specific instructions.  No future appointments.  No orders of the defined types were placed in this encounter.     -------------------------------

## 2019-07-22 DIAGNOSIS — F419 Anxiety disorder, unspecified: Secondary | ICD-10-CM | POA: Diagnosis not present

## 2019-07-25 DIAGNOSIS — I10 Essential (primary) hypertension: Secondary | ICD-10-CM | POA: Diagnosis not present

## 2019-08-07 DIAGNOSIS — G4489 Other headache syndrome: Secondary | ICD-10-CM | POA: Diagnosis not present

## 2019-08-07 DIAGNOSIS — G4733 Obstructive sleep apnea (adult) (pediatric): Secondary | ICD-10-CM | POA: Diagnosis not present

## 2019-08-07 DIAGNOSIS — I10 Essential (primary) hypertension: Secondary | ICD-10-CM | POA: Diagnosis not present

## 2019-08-08 MED FILL — RIZATRIPTAN BENZOATE 10 MG: 10 | 30 days supply | Qty: 18 | Fill #0

## 2019-08-10 DIAGNOSIS — B349 Viral infection, unspecified: Secondary | ICD-10-CM | POA: Diagnosis not present

## 2019-08-10 DIAGNOSIS — Z20828 Contact with and (suspected) exposure to other viral communicable diseases: Secondary | ICD-10-CM | POA: Diagnosis not present

## 2019-08-12 ENCOUNTER — Other Ambulatory Visit: Payer: Self-pay | Admitting: Psychiatry

## 2019-08-12 DIAGNOSIS — F419 Anxiety disorder, unspecified: Secondary | ICD-10-CM | POA: Diagnosis not present

## 2019-08-12 MED FILL — MOMETASONE FUROATE 50 MCG S: 50 | 30 days supply | Qty: 17 | Fill #1

## 2019-08-13 ENCOUNTER — Telehealth (INDEPENDENT_AMBULATORY_CARE_PROVIDER_SITE_OTHER): Payer: Self-pay

## 2019-08-13 NOTE — Telephone Encounter (Signed)
Last appt 07/18/2019 Next appt 12/2019

## 2019-08-13 NOTE — Telephone Encounter (Signed)
We added  this patient to schedule tomorrow after lunch.

## 2019-08-13 NOTE — Telephone Encounter (Signed)
Patient called and said she went for a Covid test and it did come back neg.  She had black stool on Sat and said she still feels really bad.  Stomach pain.  Please call her.

## 2019-08-14 ENCOUNTER — Ambulatory Visit (INDEPENDENT_AMBULATORY_CARE_PROVIDER_SITE_OTHER): Payer: 59 | Admitting: Internal Medicine

## 2019-08-14 ENCOUNTER — Encounter (INDEPENDENT_AMBULATORY_CARE_PROVIDER_SITE_OTHER): Payer: Self-pay | Admitting: Internal Medicine

## 2019-08-14 ENCOUNTER — Other Ambulatory Visit: Payer: Self-pay

## 2019-08-14 VITALS — BP 180/90 | HR 60 | Ht 66.0 in | Wt 230.1 lb

## 2019-08-14 DIAGNOSIS — E039 Hypothyroidism, unspecified: Secondary | ICD-10-CM | POA: Diagnosis not present

## 2019-08-14 DIAGNOSIS — K921 Melena: Secondary | ICD-10-CM | POA: Diagnosis not present

## 2019-08-14 DIAGNOSIS — R5382 Chronic fatigue, unspecified: Secondary | ICD-10-CM | POA: Diagnosis not present

## 2019-08-14 DIAGNOSIS — I10 Essential (primary) hypertension: Secondary | ICD-10-CM | POA: Diagnosis not present

## 2019-08-14 MED ORDER — AMLODIPINE BESYLATE 5 MG PO TABS: 5.0000 mg | ORAL_TABLET | Freq: Every day | ORAL | 1 refills | Status: DC

## 2019-08-14 MED FILL — ZOLPIDEM TARTRATE 10 MG TAB: 10 | 30 days supply | Qty: 30 | Fill #0

## 2019-08-14 MED FILL — AMLODIPINE BESYLATE 5 MG TA: 5 | 90 days supply | Qty: 90 | Fill #0

## 2019-08-14 NOTE — Progress Notes (Signed)
Subjective:  Patient ID: [name] [name], female    DOB: 01/09/1956  Age: 63 y.o. MRN: [id number]  CC: This lady comes in as an acute visit with history of black tarry stools and fatigue.  HPI She tells me that approximately 1 week ago, she was having flulike symptoms and then had body aches and also black tarry stools for 2 to 3 days.  She went to the urgent care and she apparently had COVID-19 testing which was negative.  She also had nausea at the time but no significant vomiting. She describes some epigastric discomfort at the present time but her stools have turned normal in color now.  She does feel fatigued and weak.  She does not feel lightheaded or dizzy. She has been taking antihypertensive medications but has not been taking hydrochlorothiazide as prescribed recently. She continues to take desiccated thyroid.    Past Medical History:  Diagnosis Date  . Allergy   . Anxiety   . Bronchiectasis   . Bronchitis    chronic  . 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 COPD (chronic obstructive pulmonary disease) ([place])   . Dental crown present    upper  . Depression   . Environmental allergies    receives allergy shots  . GERD (gastroesophageal reflux disease)   . 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 - no current med.  . Glucose intolerance (impaired glucose tolerance)    on Metformin  . H/O hiatal hernia    "sliding"  . History of echocardiogram    Echo 1/16: EF 60-65, no RWMA, Gr 1 DD, mild LAE, trivial pericardial eff post to heart  . History of echocardiogram    Echo 11/17: EF 55-60, no RWMA, Gr 1 DD, normal RV function.  Marland Kitchen History of palpitations    Holter 12/15: NSR, PACs  . Hx of migraines   . Hypothyroidism   . Osteoarthritis    knees  . Positional headache since 1997   if lies on left side     Social History   Social History Narrative   Widowed late 2019 after 40+ years of marriage   3 sons one daughter   Working as a Corporate treasurer for [employer]   No/never tobacco no drug use 1 caffeinated beverage daily, occasional or rare glass of wine    Current Meds  Medication Sig  . Cholecalciferol (VITAMIN D) 2000 UNITS tablet Take 5,000 Units by mouth daily.   . hydrochlorothiazide (HYDRODIURIL) 12.5 MG tablet   . montelukast (SINGULAIR) 10 MG tablet TAKE 1 TABLET BY MOUTH ONCE DAILY  . nebivolol (BYSTOLIC) 10 MG tablet Take 1 tablet (10 mg total) by mouth 2 (two) times daily.  . pantoprazole (PROTONIX) 40 MG tablet Take 1 tablet (40 mg total) by mouth 2 (two) times daily before a meal.  . sertraline (ZOLOFT) 100 MG tablet Take 1 tablet twice a day  . thyroid (ARMOUR) 90 MG tablet Take 120 mg by mouth daily.   Marland Kitchen zolpidem (AMBIEN) 10 MG tablet TAKE 1/2-1 TABLET BY MOUTH AT BEDTIME AS NEEDED       Objective:   Today's Vitals: BP (!) 180/90   Pulse 60   Ht 5\' 6"  (1.676 m)   Wt 230 lb 1.6 oz (104.4 kg)   LMP 12/12/2005   BMI 37.14 kg/m  Vitals with BMI 08/14/2019 01/14/2019 01/14/2019  Height 5\' 6"  - -  Weight 230 lbs 2 oz - -  BMI [id number] - -  Systolic [id number]  Diastolic 90 74 83  Pulse 60 56 66       Physical Exam She looks systemically well and is not pale.  However, blood pressure is uncontrolled.  She is alert and orientated without any focal neurological signs.   Assessment & Plan:    1. Hypothyroidism, unspecified type   2. Chronic fatigue   3. Essential hypertension   4. Black stools    1. I am concerned about the possibility of upper GI bleed.  She is hemodynamically stable at the present time.  We will check blood work to see if she is anemic or not.  I encouraged her to make sure she takes Protonix as prescribed. 2. Blood work was taken for CBC, complete metabolic panel, TSH and T3 levels. 3. I am concerned also about her uncontrolled hypertension and I am going to add amlodipine 5 mg daily to her medication regime. 4. Further recommendations will depend on blood results and I  will see her for annual physical examination in October.  I will let her know of blood results and I have told her that she should go to the emergency room if she has black tarry stools once again.   Doree Albee, MD

## 2019-08-16 ENCOUNTER — Encounter (INDEPENDENT_AMBULATORY_CARE_PROVIDER_SITE_OTHER): Payer: Self-pay | Admitting: Internal Medicine

## 2019-08-19 MED FILL — NP THYROID 120 MG TABLET: 120 | 30 days supply | Qty: 30 | Fill #1

## 2019-08-21 DIAGNOSIS — M7072 Other bursitis of hip, left hip: Secondary | ICD-10-CM | POA: Diagnosis not present

## 2019-08-28 ENCOUNTER — Ambulatory Visit (INDEPENDENT_AMBULATORY_CARE_PROVIDER_SITE_OTHER): Payer: 59 | Admitting: Internal Medicine

## 2019-09-03 ENCOUNTER — Other Ambulatory Visit: Payer: Self-pay | Admitting: Psychiatry

## 2019-09-03 MED FILL — TRAVOPROST (BAK FREE) 0.004: 0.004 | 50 days supply | Qty: 5 | Fill #0

## 2019-09-03 MED FILL — GABAPENTIN 800 MG TABS: 800 | 30 days supply | Qty: 120 | Fill #1

## 2019-09-04 MED FILL — SERTRALINE HCL 100 MG TAB: 100 | 90 days supply | Qty: 180 | Fill #0

## 2019-09-10 DIAGNOSIS — H401134 Primary open-angle glaucoma, bilateral, indeterminate stage: Secondary | ICD-10-CM | POA: Diagnosis not present

## 2019-09-16 MED FILL — PANTOPRAZOLE SOD DR 40 MG T: 40 | 30 days supply | Qty: 60 | Fill #2

## 2019-09-16 MED FILL — NP THYROID 120 MG TABLET: 120 | 30 days supply | Qty: 30 | Fill #2

## 2019-09-16 MED FILL — BYSTOLIC 10 MG TABLET: 10 | 30 days supply | Qty: 60 | Fill #1

## 2019-09-20 DIAGNOSIS — F419 Anxiety disorder, unspecified: Secondary | ICD-10-CM | POA: Diagnosis not present

## 2019-10-07 ENCOUNTER — Ambulatory Visit (INDEPENDENT_AMBULATORY_CARE_PROVIDER_SITE_OTHER): Payer: 59 | Admitting: Internal Medicine

## 2019-10-07 ENCOUNTER — Other Ambulatory Visit: Payer: Self-pay

## 2019-10-07 ENCOUNTER — Encounter (INDEPENDENT_AMBULATORY_CARE_PROVIDER_SITE_OTHER): Payer: Self-pay | Admitting: Internal Medicine

## 2019-10-07 VITALS — BP 190/90 | HR 72 | Ht 61.0 in | Wt 233.8 lb

## 2019-10-07 DIAGNOSIS — Z0001 Encounter for general adult medical examination with abnormal findings: Secondary | ICD-10-CM

## 2019-10-07 DIAGNOSIS — E559 Vitamin D deficiency, unspecified: Secondary | ICD-10-CM | POA: Diagnosis not present

## 2019-10-07 DIAGNOSIS — E039 Hypothyroidism, unspecified: Secondary | ICD-10-CM | POA: Diagnosis not present

## 2019-10-07 DIAGNOSIS — Z1159 Encounter for screening for other viral diseases: Secondary | ICD-10-CM

## 2019-10-07 DIAGNOSIS — K219 Gastro-esophageal reflux disease without esophagitis: Secondary | ICD-10-CM | POA: Diagnosis not present

## 2019-10-07 DIAGNOSIS — I10 Essential (primary) hypertension: Secondary | ICD-10-CM | POA: Diagnosis not present

## 2019-10-07 MED ORDER — ESTRADIOL 0.5 MG PO TABS: 0.5000 mg | ORAL_TABLET | Freq: Every day | ORAL | 3 refills | Status: DC

## 2019-10-07 MED ORDER — MOMETASONE FUROATE 50 MCG/ACT NA SUSP: 2.0000 | Freq: Every day | NASAL | 3 refills | Status: DC

## 2019-10-07 MED ORDER — PROGESTERONE MICRONIZED 200 MG PO CAPS: 400.0000 mg | ORAL_CAPSULE | Freq: Every evening | ORAL | 3 refills | Status: DC

## 2019-10-07 MED FILL — MOMETASONE FUROATE 50 MCG S: 50 | 30 days supply | Qty: 17 | Fill #0

## 2019-10-07 MED FILL — ESTRADIOL 0.5 MG TABS: 0.5 | 30 days supply | Qty: 30 | Fill #0

## 2019-10-07 MED FILL — PROGESTERONE 200 MG CAPSULE: 200 | 30 days supply | Qty: 60 | Fill #0

## 2019-10-07 NOTE — Progress Notes (Signed)
Chief Complaint: This 63 year old lady comes in for an annual physical exam and to address her chronic conditions which are described below. HPI: Her main issue really is 1 of morbid obesity and this is really impacting how she feels and pain that she is having for which she is seen several different specialist. She also has had difficulty with complicated grief from the loss of her husband about 1 year ago. She also has been suffering with hypertension and I had started her on amlodipine 5 mg daily on the last visit.  She tends to get nervous coming to the office and rushes and also I think this contributes to her blood pressure being elevated.  Thankfully, she does not have a history of heart disease, cerebrovascular disease.  She denies any chest pain, dyspnea, palpitations or limb weakness. She continues on desiccated NP thyroid for her hypothyroidism and her free T3 level last time it was checked in September was under very good optimal level. She is currently wondering about whether she should try bioidentical hormone therapy again.  She certainly does not sleep well at all and takes Ambien.  Past Medical History:  Diagnosis Date  . Allergy   . Anxiety   . Bronchiectasis   . Bronchitis    chronic  . 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 COPD (chronic obstructive pulmonary disease) ([place])   . Dental crown present    upper  . Depression   . Environmental allergies    receives allergy shots  . GERD (gastroesophageal reflux disease)   . 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 - no current med.  . Glucose intolerance (impaired glucose tolerance)    on Metformin  . H/O hiatal hernia    "sliding"  . History of echocardiogram    Echo 1/16: EF 60-65, no RWMA, Gr 1 DD, mild LAE, trivial pericardial eff post to heart  . History of echocardiogram    Echo 11/17: EF 55-60, no RWMA, Gr 1 DD, normal RV function.  Marland Kitchen History of palpitations    Holter 12/15: NSR, PACs  . Hx of  migraines   . Hypothyroidism   . Osteoarthritis    knees  . Positional headache since 1997   if lies on left side   Past Surgical History:  Procedure Laterality Date  . CHOLECYSTECTOMY  04/21/2011  . CHONDROPLASTY  10/19/2012   Procedure: CHONDROPLASTY;  Surgeon: Yvette Rack., MD;  Location: [facility];  Service: Orthopedics;  Laterality: Right;  . COLONOSCOPY    . KNEE ARTHROSCOPY  10/19/2012   Procedure: ARTHROSCOPY KNEE;  Surgeon: Yvette Rack., MD;  Location: [facility];  Service: Orthopedics;  Laterality: Right;  . KNEE ARTHROSCOPY Right 02/01/2013   Procedure: RIGHT KNEE ARTHROSCOPY WITH MEDIAL MENISCECTOMY, DEBRIDEMENT CHONDROMALACIA PATELLA;  Surgeon: Yvette Rack., MD;  Location: [facility];  Service: Orthopedics;  Laterality: Right;  RIGHT KNEE ARTHROSCOPY WITH MEDIAL MENISCECTOMY, DEBRIDEMENT CHONDROMALACIA PATELLA  . KNEE ARTHROSCOPY Left 03/15/2013   Procedure: LEFT KNEE ARTHROSCOPY WITH DEBRIDEMENT/SHAVING CHONDROPLASTY WITH MEDIAL MENISECTOMY;  Surgeon: Yvette Rack., MD;  Location: [facility];  Service: Orthopedics;  Laterality: Left;  . KNEE ARTHROSCOPY WITH LATERAL MENISECTOMY  10/19/2012   Procedure: KNEE ARTHROSCOPY WITH LATERAL MENISECTOMY;  Surgeon: Yvette Rack., MD;  Location: [facility];  Service: Orthopedics;  Laterality: Right;  . TONSILLECTOMY AND ADENOIDECTOMY     age 32  .  TUBAL LIGATION  1986  . WISDOM TOOTH EXTRACTION       Social History   Social History Narrative   Widowed late 2019 after 40+ years of marriage   3 sons one daughter   Working as a Company secretary for [employer]   No/never tobacco no drug use 1 caffeinated beverage daily, occasional or rare glass of wine        Allergies:  Allergies  Allergen Reactions  . Levaquin [Levofloxacin] Other (See Comments)    MUSCLE PAIN  . Nickel Rash     Current Meds  Medication Sig  . albuterol  (PROAIR HFA) 108 (90 BASE) MCG/ACT inhaler 2 puffs every 4 hours as needed only  if your can't catch your breath  . amLODipine (NORVASC) 5 MG tablet Take 1 tablet (5 mg total) by mouth daily.  . B Complex Vitamins (B-COMPLEX/B-12) LIQD Place 5 mLs under the tongue daily at 2 PM daily at 2 PM.  . celecoxib (CELEBREX) 200 MG capsule Take 200 mg by mouth daily as needed.   . Cholecalciferol (VITAMIN D) 125 MCG (5000 UT) CAPS Take 5,000 Units by mouth daily.   . fexofenadine (ALLEGRA) 180 MG tablet Take 180 mg by mouth daily.  Marland Kitchen gabapentin (NEURONTIN) 800 MG tablet Take 800 mg by mouth 4 (four) times daily as needed.   . hydrochlorothiazide (HYDRODIURIL) 12.5 MG tablet Take 12.5 mg by mouth daily.   Marland Kitchen LORazepam (ATIVAN) 1 MG tablet Take 1 tablet (1 mg total) by mouth daily as needed.  . mometasone (NASONEX) 50 MCG/ACT nasal spray Place 2 sprays into the nose daily.  . montelukast (SINGULAIR) 10 MG tablet TAKE 1 TABLET BY MOUTH ONCE DAILY  . nebivolol (BYSTOLIC) 10 MG tablet Take 1 tablet (10 mg total) by mouth 2 (two) times daily.  . NP THYROID 120 MG tablet Take 120 mg by mouth daily before breakfast.  . pantoprazole (PROTONIX) 40 MG tablet Take 1 tablet (40 mg total) by mouth 2 (two) times daily before a meal.  . sertraline (ZOLOFT) 100 MG tablet TAKE 1 TABLET BY MOUTH TWICE A DAY  . Travoprost, BAK Free, (TRAVATAN) 0.004 % SOLN ophthalmic solution Place 1 drop into both eyes at bedtime.  Marland Kitchen zolpidem (AMBIEN) 10 MG tablet TAKE 1/2-1 TABLET BY MOUTH AT BEDTIME AS NEEDED  . [DISCONTINUED] mometasone (NASONEX) 50 MCG/ACT nasal spray PLACE 2 SPRAYS INTO THE NOSE DAILY.     Nutrition This is variable and on the days that she is working, I think she does not eat well.  On the days that she is at home which is usually 4 days in a week, she tends to only eat 1 meal a day as she does not like to cook for only herself.  Sleep Poor sleep.  She takes Ambien.  Exercise None regular.  Bio-identical  hormones NP thyroid for hypothyroidism.  [id number] from the symptoms mentioned above,there are no other symptoms referable to all systems reviewed.  Physical Exam: Blood pressure (!) 190/90, pulse 72, height 5\' 1"  (1.549 m), weight 233 lb 12.8 oz (106.1 kg), last menstrual period 12/12/2005. Vitals with BMI 10/07/2019 08/14/2019 01/14/2019  Height 5\' 1"  5\' 6"  -  Weight 233 lbs 13 oz 230 lbs 2 oz -  BMI [id number] -  Systolic [id number]  Diastolic 90 90 74  Pulse 72 60 56      She remains morbidly obese.  She has gained 3 pounds since the last time I saw  her in the office.  Blood pressure systolic is elevated but on this occasion she was rushing to get here. General: Alert, cooperative, and appears to be the stated age.No pallor.  No jaundice.  No clubbing. Head: Normocephalic Eyes: Sclera white, pupils equal and reactive to light, red reflex x 2,  Ears: Normal bilaterally Oral cavity: Lips, mucosa, and tongue normal: Teeth and gums normal Neck: No adenopathy, supple, symmetrical, trachea midline, and thyroid does not appear enlarged Respiratory: Clear to auscultation bilaterally.No wheezing, crackles or bronchial breathing. Cardiovascular: Heart sounds are present and appear to be normal without murmurs or added sounds.  No carotid bruits.  Peripheral pulses are present and equal bilaterally.: Gastrointestinal:positive bowel sounds, no hepatosplenomegaly.  No masses felt.No tenderness. Skin: Clear, No rashes noted.No worrisome skin lesions seen. Neurological: Grossly intact without focal findings, cranial nerves II through XII intact, muscle strength equal bilaterally Musculoskeletal: No acute joint abnormalities noted.Full range of movement noted with joints. Psychiatric: Affect appropriate, non-anxious.    Assessment  1. Essential hypertension   2. Morbid obesity ([place])   3. Acquired hypothyroidism   4. Gastroesophageal reflux disease without esophagitis   5. Encounter for  hepatitis C screening test for low risk patient   6. Vitamin D deficiency disease   7. Encounter for general adult medical examination with abnormal findings     Tests Ordered:   Orders Placed This Encounter  Procedures  . Hemoglobin A1c  . Lipid panel  . Hepatitis C antibody  . VITAMIN D 25 Hydroxy (Vit-D Deficiency, Fractures)     Plan  1. Continue with the same antihypertensive medication but I would be amenable to increasing the amlodipine dose sooner rather than later.  She will monitor this at home. 2. Continue with NP thyroid for hypothyroidism. 3. Continue with Protonix for gastroesophageal reflux disease. 4. I will restart bioidentical hormone therapy but this time a higher dose of progesterone at night and a lower dose of estradiol.  Hopefully she will not get bleeding as she did before. 5. I discussed nutrition again and the idea of intermittent fasting, maintaining hydration and salt and also combined with a more of a whole food plant-based diet.  She will think about it. 6. I will see her in about 6 weeks time for follow-up and further recommendations will depend on blood results. 7. Today, in addition to a preventative visit, I performed an office visit to address her chronic conditions above.     Meds ordered this encounter  Medications  . progesterone (PROMETRIUM) 200 MG capsule    Sig: Take 2 capsules (400 mg total) by mouth every evening.    Dispense:  60 capsule    Refill:  3  . estradiol (ESTRACE) 0.5 MG tablet    Sig: Take 1 tablet (0.5 mg total) by mouth daily.    Dispense:  30 tablet    Refill:  3  . mometasone (NASONEX) 50 MCG/ACT nasal spray    Sig: Place 2 sprays into the nose daily.    Dispense:  17 g    Refill:  3     Hawkin Charo C Adelayde Minney   10/07/2019, 5:07 PM

## 2019-10-07 NOTE — Patient Instructions (Signed)
[name] Dietary Recommendations for Weight Loss What to Avoid . Avoid added sugars o Often added sugar can be found in processed foods such as many condiments, dry cereals, cakes, cookies, chips, crisps, crackers, candies, sweetened drinks, etc.  o Read labels and AVOID/DECREASE use of foods with the following in their ingredient list: Sugar, fructose, high fructose corn syrup, sucrose, glucose, maltose, dextrose, molasses, cane sugar, brown sugar, any type of syrup, agave nectar, etc.   . Avoid snacking in between meals . Avoid foods made with flour o If you are going to eat food made with flour, choose those made with whole-grains; and, minimize your consumption as much as is tolerable . Avoid processed foods o These foods are generally stocked in the middle of the grocery store. Focus on shopping on the perimeter of the grocery.  What to Include . Vegetables o GREEN LEAFY VEGETABLES: Kale, spinach, mustard greens, collard greens, cabbage, broccoli, etc. o OTHER: Asparagus, cauliflower, eggplant, carrots, peas, Brussel sprouts, tomatoes, bell peppers, zucchini, beets, cucumbers, etc. . Grains, seeds, and legumes o Beans: kidney beans, black eyed peas, garbanzo beans, black beans, pinto beans, etc. o Whole, unrefined grains: brown rice, barley, bulgur, oatmeal, etc. . Healthy fats  o Avoid highly processed fats such as vegetable oil o Examples of healthy fats: avocado, olives, virgin olive oil, dark chocolate (?72% Cocoa), nuts (peanuts, almonds, walnuts, cashews, pecans, etc.) . Low - Moderate Intake of Animal Sources of Protein o Meat sources: chicken, turkey, salmon, tuna. Limit to 4 ounces of meat at one time. o Consider limiting dairy sources, but when choosing dairy focus on: PLAIN Greek yogurt, cottage cheese, high-protein milk . Fruit o Choose berries  When to Eat . Intermittent Fasting: o Choosing not to eat for a specific time period, but DO FOCUS ON HYDRATION  when fasting o Multiple Techniques: - Time Restricted Eating: eat 3 meals in a day, each meal lasting no more than 60 minutes, no snacks between meals - 16-18 hour fast: fast for 16 to 18 hours up to 7 days a week. Often suggested to start with 2-3 nonconsecutive days per week.  . Remember the time you sleep is counted as fasting.  . Examples of eating schedule: Fast from 7:00pm-11:00am. Eat between 11:00am-7:00pm.  - 24-hour fast: fast for 24 hours up to every other day. Often suggested to start with 1 day per week . Remember the time you sleep is counted as fasting . Examples of eating schedule:  o Eating day: eat 2-3 meals on your eating day. If doing 2 meals, each meal should last no more than 90 minutes. If doing 3 meals, each meal should last no more than 60 minutes. Finish last meal by 7:00pm. o Fasting day: Fast until 7:00pm.  o IF YOU FEEL UNWELL FOR ANY REASON/IN ANY WAY WHEN FASTING, STOP FASTING BY EATING A NUTRITIOUS SNACK OR LIGHT MEAL o ALWAYS FOCUS ON HYDRATION DURING FASTS - Acceptable Hydration sources: water, broths, tea/coffee (black tea/coffee is best but using a small amount of whole-fat dairy products in coffee/tea is acceptable).  - Poor Hydration Sources: anything with sugar or artificial sweeteners added to it  These recommendations have been developed for patients that are actively receiving medical care from either Dr. Dajah Fischman or Sarah Gray, DNP, NP-C at [facility]. These recommendations are developed for patients with specific medical conditions and are not meant to be distributed or used by others that are not actively receiving care from either provider listed   above at [facility]. It is not appropriate to participate in the above eating plans without proper medical supervision.   Reference: Fung, J. The obesity code. [place]: [place]; 2016.   

## 2019-10-08 LAB — LIPID PANEL
Cholesterol: 258 mg/dL — ABNORMAL HIGH (ref <200)
HDL: 53 mg/dL (ref >=50)
LDL Cholesterol (Calc): 160 mg/dL (calc) — ABNORMAL HIGH
Non-HDL Cholesterol (Calc): 205 mg/dL (calc) — ABNORMAL HIGH (ref <130)
Total CHOL/HDL Ratio: 4.9 (calc) (ref <5.0)
Triglycerides: 276 mg/dL — ABNORMAL HIGH (ref <150)

## 2019-10-08 LAB — HEPATITIS C ANTIBODY
Hepatitis C Ab: NONREACTIVE
SIGNAL TO CUT-OFF: 0.01 (ref <1.00)

## 2019-10-08 LAB — VITAMIN D 25 HYDROXY (VIT D DEFICIENCY, FRACTURES): Vit D, 25-Hydroxy: 38 ng/mL (ref 30–100)

## 2019-10-08 LAB — HEMOGLOBIN A1C
Hgb A1c MFr Bld: 5.7 % of total Hgb — ABNORMAL HIGH (ref <5.7)
Mean Plasma Glucose: 117 (calc)
eAG (mmol/L): 6.5 (calc)

## 2019-10-11 MED FILL — ZOLPIDEM TARTRATE 10 MG TAB: 10 | 30 days supply | Qty: 30 | Fill #2

## 2019-10-25 ENCOUNTER — Other Ambulatory Visit (INDEPENDENT_AMBULATORY_CARE_PROVIDER_SITE_OTHER): Payer: Self-pay | Admitting: Internal Medicine

## 2019-10-25 MED FILL — NP THYROID 120 MG TABLET: 120 | 30 days supply | Qty: 30 | Fill #0

## 2019-10-25 MED FILL — BYSTOLIC 10 MG TABLET: 10 | 30 days supply | Qty: 60 | Fill #2

## 2019-10-25 MED FILL — PANTOPRAZOLE SOD DR 40 MG T: 40 | 30 days supply | Qty: 60 | Fill #0

## 2019-11-11 MED FILL — ZOLPIDEM TARTRATE 10 MG TAB: 10 | 30 days supply | Qty: 30 | Fill #3

## 2019-11-19 ENCOUNTER — Encounter (INDEPENDENT_AMBULATORY_CARE_PROVIDER_SITE_OTHER): Payer: Self-pay | Admitting: Internal Medicine

## 2019-11-19 ENCOUNTER — Ambulatory Visit (INDEPENDENT_AMBULATORY_CARE_PROVIDER_SITE_OTHER): Payer: 59 | Admitting: Internal Medicine

## 2019-11-19 ENCOUNTER — Other Ambulatory Visit (INDEPENDENT_AMBULATORY_CARE_PROVIDER_SITE_OTHER): Payer: Self-pay | Admitting: Internal Medicine

## 2019-11-19 ENCOUNTER — Other Ambulatory Visit: Payer: Self-pay

## 2019-11-19 DIAGNOSIS — E559 Vitamin D deficiency, unspecified: Secondary | ICD-10-CM | POA: Diagnosis not present

## 2019-11-19 DIAGNOSIS — E2839 Other primary ovarian failure: Secondary | ICD-10-CM

## 2019-11-19 DIAGNOSIS — E039 Hypothyroidism, unspecified: Secondary | ICD-10-CM

## 2019-11-19 DIAGNOSIS — I10 Essential (primary) hypertension: Secondary | ICD-10-CM | POA: Diagnosis not present

## 2019-11-19 NOTE — Progress Notes (Signed)
Metrics: Intervention Frequency ACO  Documented Smoking Status Yearly  Screened one or more times in 24 months  Cessation Counseling or  Active cessation medication Past 24 months  Past 24 months   Guideline developer: UpToDate (See UpToDate for funding source) Date Released: 2014       Wellness Office Visit  Subjective:  Patient ID: [name] [name], female    DOB: Apr 30, 1956  Age: 63 y.o. MRN: [id number]  CC: This lady comes in for follow-up regarding her menopausal bioidentical therapy as well as morbid obesity, hypertension which I was concerned about on the last visit. HPI  She has been compliant with taking her antihypertensive medications. I had recommended she start estradiol at a low dose and progesterone at a higher dose but she talked to OB/GYN Dr. Runell Gess and now she is been taking progesterone 200 mg at night but has not started estradiol yet. She is currently in a sad state as she is missing her husband again and today has not been a great day.  She also is in pain in the pelvic area.  She is going to see the specialist for pain relief tomorrow. She has been taking vitamin D3 at a higher dose of 10,000 units daily. As far as nutrition is concerned, she did do intermittent fasting for about 3 days but then has not continued this.  She has gone back to eating fast food when she can get it. Past Medical History:  Diagnosis Date  . Allergy   . Anxiety   . Bronchiectasis   . Bronchitis    chronic  . 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 COPD (chronic obstructive pulmonary disease) ([place])   . Dental crown present    upper  . Depression   . Environmental allergies    receives allergy shots  . GERD (gastroesophageal reflux disease)   . 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 - no current med.  . Glucose intolerance (impaired glucose tolerance)    on Metformin  . H/O hiatal hernia    "sliding"  . History of echocardiogram    Echo 1/16: EF 60-65, no RWMA,  Gr 1 DD, mild LAE, trivial pericardial eff post to heart  . History of echocardiogram    Echo 11/17: EF 55-60, no RWMA, Gr 1 DD, normal RV function.  Marland Kitchen History of palpitations    Holter 12/15: NSR, PACs  . Hx of migraines   . Hypothyroidism   . Osteoarthritis    knees  . Positional headache since 1997   if lies on left side      Family History  Problem Relation Age of Onset  . Lung cancer Father 19       dies age 63  . Emphysema Father   . COPD Brother 47       died age 93  . Stroke Mother   . Hypertension Sister   . Thyroid disease Sister        Graves Disease    Social History   Social History Narrative   Widowed late 2019 after 40+ years of marriage   3 sons one daughter   Working as a Company secretary for [employer]   No/never tobacco no drug use 1 caffeinated beverage daily, occasional or rare glass of wine   Social History   Tobacco Use  . Smoking status: Never Smoker  . Smokeless tobacco: Never Used  Substance Use Topics  . Alcohol use: Yes  Alcohol/week: 4.0 standard drinks    Types: 4 Glasses of wine per week    Current Meds  Medication Sig  . albuterol (PROAIR HFA) 108 (90 BASE) MCG/ACT inhaler 2 puffs every 4 hours as needed only  if your can't catch your breath  . amLODipine (NORVASC) 5 MG tablet Take 1 tablet (5 mg total) by mouth daily.  . B Complex Vitamins (B-COMPLEX/B-12) LIQD Place 5 mLs under the tongue daily at 2 PM daily at 2 PM.  . celecoxib (CELEBREX) 200 MG capsule Take 200 mg by mouth daily as needed.   . Cholecalciferol (VITAMIN D) 125 MCG (5000 UT) CAPS Take 5,000 Units by mouth daily.   Marland Kitchen estradiol (ESTRACE) 0.5 MG tablet Take 1 tablet (0.5 mg total) by mouth daily.  . fexofenadine (ALLEGRA) 180 MG tablet Take 180 mg by mouth daily.  Marland Kitchen gabapentin (NEURONTIN) 800 MG tablet Take 800 mg by mouth 4 (four) times daily as needed.   . hydrochlorothiazide (HYDRODIURIL) 12.5 MG tablet Take 12.5 mg by mouth daily.   Marland Kitchen LORazepam  (ATIVAN) 1 MG tablet Take 1 tablet (1 mg total) by mouth daily as needed.  . mometasone (NASONEX) 50 MCG/ACT nasal spray Place 2 sprays into the nose daily.  . montelukast (SINGULAIR) 10 MG tablet TAKE 1 TABLET BY MOUTH ONCE DAILY  . nebivolol (BYSTOLIC) 10 MG tablet Take 1 tablet (10 mg total) by mouth 2 (two) times daily.  . NP THYROID 120 MG tablet Take 120 mg by mouth daily before breakfast.  . pantoprazole (PROTONIX) 40 MG tablet TAKE 1 TABLET BY MOUTH TWICE A DAY  . progesterone (PROMETRIUM) 200 MG capsule Take 2 capsules (400 mg total) by mouth every evening. (Patient taking differently: Take 200 mg by mouth every evening. )  . sertraline (ZOLOFT) 100 MG tablet TAKE 1 TABLET BY MOUTH TWICE A DAY  . Travoprost, BAK Free, (TRAVATAN) 0.004 % SOLN ophthalmic solution Place 1 drop into both eyes at bedtime.  Marland Kitchen zolpidem (AMBIEN) 10 MG tablet TAKE 1/2-1 TABLET BY MOUTH AT BEDTIME AS NEEDED     Bio Identical Hormones  Micronized progesterone is being used in this patient for multiple benefits based on studies including protection against uterine cancer, breast cancer, osteoporosis and heart disease. The patient has been counseled regarding side effects, benefits and modes of administration. The patient is agreeable that this therapy is an integral part of her wellness, quality of life and prevention of chronic disease.  Objective:   Today's Vitals: BP (!) 160/80   Pulse 64   Ht _0  (1.549 m)   Wt 239 lb 3.2 oz (108.5 kg)   LMP 12/12/2005   BMI 45.20 kg/m  Vitals with BMI 11/19/2019 10/07/2019 08/14/2019  Height _1  _2  _3   Weight 239 lbs 3 oz 233 lbs 13 oz 230 lbs 2 oz  BMI 45.22 41.9 37.90  Systolic 240 973 532  Diastolic 80 90 90  Pulse 64 72 60     Physical Exam   She is tearful today.  She has gained 6 pounds in weight since the last time I saw her.  However, her blood pressure is improved.    Assessment   1. Morbid obesity ([place])   2. Essential hypertension    3. Acquired hypothyroidism   4. Vitamin D deficiency disease   5. Primary ovarian failure       Tests ordered Orders Placed This Encounter  Procedures  . Vitamin D, 25-hydroxy  . CMP with  eGFR([facility])  . Progesterone     Plan: 1. Blood work is ordered as above. 2. I have told her that we will see what the progesterone levels are and I am aiming for a level of 10 or greater.  I will then let her know what to do with the dose.  She can also discuss with her OB/GYN. 3. She will continue with vitamin D3 supplementation and we will see what the levels are. 4. We discussed again the importance of trying to lose weight and the psychological and emotional barriers that she has at the present time.  I told her that she should be the person she wants to be in the future which will help her change behavior. 5. Follow-up in about 6 weeks or so to see how she is doing, at her request. 6. Further recommendations will depend on blood results. 7. Today I spent 40 minutes with this patient face-to-face, more than 50% of the time was involved in discussing the current barriers she is facing in terms of her emotions/grieving and pelvic pain.   No orders of the defined types were placed in this encounter.   Doree Albee, MD

## 2019-11-20 ENCOUNTER — Encounter (INDEPENDENT_AMBULATORY_CARE_PROVIDER_SITE_OTHER): Payer: Self-pay | Admitting: Internal Medicine

## 2019-11-20 LAB — COMPLETE METABOLIC PANEL WITH GFR
AG Ratio: 1.9 (calc) (ref 1.0–2.5)
ALT: 26 U/L (ref 6–29)
AST: 19 U/L (ref 10–35)
Albumin: 4.5 g/dL (ref 3.6–5.1)
Alkaline phosphatase (APISO): 86 U/L (ref 37–153)
BUN/Creatinine Ratio: 17 (calc) (ref 6–22)
BUN: 19 mg/dL (ref 7–25)
CO2: 26 mmol/L (ref 20–32)
Calcium: 9.6 mg/dL (ref 8.6–10.4)
Chloride: 103 mmol/L (ref 98–110)
Creat: 1.09 mg/dL — ABNORMAL HIGH (ref 0.50–0.99)
GFR, Est African American: 62 mL/min/{1.73_m2} (ref >=60)
GFR, Est Non African American: 54 mL/min/{1.73_m2} — ABNORMAL LOW (ref >=60)
Globulin: 2.4 g/dL (calc) (ref 1.9–3.7)
Glucose, Bld: 98 mg/dL (ref 65–99)
Potassium: 4.7 mmol/L (ref 3.5–5.3)
Sodium: 140 mmol/L (ref 135–146)
Total Bilirubin: 0.3 mg/dL (ref 0.2–1.2)
Total Protein: 6.9 g/dL (ref 6.1–8.1)

## 2019-11-20 LAB — PROGESTERONE: Progesterone: 7.5 ng/mL

## 2019-11-20 LAB — VITAMIN D 25 HYDROXY (VIT D DEFICIENCY, FRACTURES): Vit D, 25-Hydroxy: 40 ng/mL (ref 30–100)

## 2019-11-27 MED FILL — MONTELUKAST SOD 10 MG TAB: 10 | 90 days supply | Qty: 90 | Fill #0

## 2019-11-27 MED FILL — AMLODIPINE BESYLATE 5 MG TA: 5 | 90 days supply | Qty: 90 | Fill #1

## 2019-11-27 MED FILL — SERTRALINE HCL 100 MG TAB: 100 | 90 days supply | Qty: 180 | Fill #1

## 2019-11-27 MED FILL — NP THYROID 120 MG TABLET: 120 | 30 days supply | Qty: 30 | Fill #1

## 2019-11-27 MED FILL — PANTOPRAZOLE SOD DR 40 MG T: 40 | 30 days supply | Qty: 60 | Fill #1

## 2019-11-27 MED FILL — GABAPENTIN 800 MG TABS: 800 | 30 days supply | Qty: 120 | Fill #2

## 2019-11-28 ENCOUNTER — Other Ambulatory Visit (INDEPENDENT_AMBULATORY_CARE_PROVIDER_SITE_OTHER): Payer: Self-pay | Admitting: Internal Medicine

## 2019-11-28 MED FILL — BYSTOLIC 10 MG TABLET: 10 | 90 days supply | Qty: 90 | Fill #0

## 2019-12-03 ENCOUNTER — Telehealth (INDEPENDENT_AMBULATORY_CARE_PROVIDER_SITE_OTHER): Payer: Self-pay

## 2019-12-03 ENCOUNTER — Other Ambulatory Visit (INDEPENDENT_AMBULATORY_CARE_PROVIDER_SITE_OTHER): Payer: Self-pay | Admitting: Internal Medicine

## 2019-12-03 MED ORDER — NEBIVOLOL HCL 10 MG PO TABS: 10.0000 mg | ORAL_TABLET | Freq: Two times a day (BID) | ORAL | 0 refills | Status: DC

## 2019-12-03 MED FILL — MOMETASONE FUROATE 50 MCG S: 50 | 30 days supply | Qty: 17 | Fill #1

## 2019-12-03 NOTE — Telephone Encounter (Signed)
Please let patient know that I have sent a 90-day supply so that she will take 2 tablets daily to [facility].

## 2019-12-03 NOTE — Telephone Encounter (Signed)
CALLED AND LEFT A VOICE MESSAGE

## 2019-12-12 MED FILL — ZOLPIDEM TARTRATE 10 MG TAB: 10 | 30 days supply | Qty: 30 | Fill #4

## 2019-12-16 ENCOUNTER — Telehealth: Payer: Self-pay | Admitting: Internal Medicine

## 2019-12-17 NOTE — Telephone Encounter (Signed)
LMTCB

## 2019-12-17 NOTE — Telephone Encounter (Signed)
Spoke with pt, she is wanting to ask CY if she should get the Covid vaccine. She is stating that ;she never received the flu vaccine in the last 7 years and CY always wrote a note for her to not receive one due to a bad reaction she had in the past. She doesn't know what is in the Covid vaccine but wanted to see if CY would recommend her to get this vaccine. CY please advise.

## 2019-12-17 NOTE — Telephone Encounter (Signed)
The risk of serious reaction to the Covid vaccine is very low. I have not seen any suggestion that reaction to the flu vaccine predicts increased risk from the Covid vaccine.  She is in a high risk group for potentially getting very sick if she does catch Covid. What I am telling people is to pretreat with benadryl antihistamine about 30 minutes to an hour before getting the covid shots, if they are worried about allergy.

## 2019-12-17 NOTE — Telephone Encounter (Signed)
Pt returned call & can be reached at [phone].

## 2019-12-18 NOTE — Telephone Encounter (Signed)
Spoke with pt. She is aware of CY's response. While on the phone with the pt, she requested to schedule her yearly OV with CY. This has been scheduled for 01/23/20 at 1600. Nothing further was needed.

## 2019-12-26 ENCOUNTER — Ambulatory Visit (INDEPENDENT_AMBULATORY_CARE_PROVIDER_SITE_OTHER): Payer: 59 | Admitting: Psychiatry

## 2019-12-26 ENCOUNTER — Encounter: Payer: Self-pay | Admitting: Psychiatry

## 2019-12-26 ENCOUNTER — Other Ambulatory Visit: Payer: Self-pay

## 2019-12-26 DIAGNOSIS — F4001 Agoraphobia with panic disorder: Secondary | ICD-10-CM | POA: Diagnosis not present

## 2019-12-26 DIAGNOSIS — F411 Generalized anxiety disorder: Secondary | ICD-10-CM | POA: Diagnosis not present

## 2019-12-26 DIAGNOSIS — F331 Major depressive disorder, recurrent, moderate: Secondary | ICD-10-CM | POA: Diagnosis not present

## 2019-12-26 DIAGNOSIS — F4321 Adjustment disorder with depressed mood: Secondary | ICD-10-CM | POA: Diagnosis not present

## 2019-12-26 DIAGNOSIS — F5105 Insomnia due to other mental disorder: Secondary | ICD-10-CM | POA: Diagnosis not present

## 2019-12-26 MED ORDER — LORAZEPAM 1 MG PO TABS: 1.0000 mg | ORAL_TABLET | Freq: Every day | ORAL | 0 refills | Status: DC | PRN

## 2019-12-26 MED ORDER — TOPIRAMATE 25 MG PO TABS: 50.0000 mg | ORAL_TABLET | Freq: Two times a day (BID) | ORAL | 1 refills | Status: DC

## 2019-12-26 MED FILL — TOPIRAMATE 25 MG TABLET: 25 | 30 days supply | Qty: 120 | Fill #0

## 2019-12-26 MED FILL — LORazepam 1 MG TABS: 1 | 30 days supply | Qty: 30 | Fill #0

## 2019-12-26 NOTE — Progress Notes (Signed)
[name] [name] [id number] [birth year] 64 y.o.  Subjective:   Patient ID:  [name] [name] is a 64 y.o. (DOB 02/24/1956) female.  Chief Complaint:  Chief Complaint  Patient presents with  . Follow-up    Medication Management  . Depression    Medication Management  . Anxiety    Depression        Associated symptoms include no decreased concentration and no suicidal ideas. Medication Refill Pertinent negatives include no weakness.   [name] [name] presents to the office today for follow-up of anxiety.  seen April 2020.  She was dealing with grief and depression and sertraline was increased back to 200 mg daily.  [name] [name] died in 10/18/18  from cancer.  Married 41 years.  Is able to focus on positive memories.  Not ready to move.  Last seen August 2020.  No meds were changed.  She continued sertraline 200 mg daily.  More trouble this holiday without husband.  Still has bad days.  If not active is sadder and sadder.  Need to be busy.  Feel better at work.  A lot of pain.  Hixtory of knee surgeries.  Trying to lose and lost 11#.  Would like weight loss meds.  Knows still needs the Zoloft. Still a lot of grief.   Thinks had downward spiral with heat and not being able to get out.  More depressed days. Low energy.   Neglecting things. More minor panic with more Ativan but not often.   Few visitiors.  Boys mow her place.  D invited her to dinner occ..  Low motivation and energy.  Normal grief path.  Some anger with it too.   Gaining weight PCP not supportive of use of weight loss meds.    Past Psychiatric Medication Trials:  Zoloft 300, Ativan,  Remote history topiramate for migraine 1998, Belviq helped lose 85#  Review of Systems:  Review of Systems  Constitutional: Positive for unexpected weight change.       Sweating  Genitourinary: Negative for pelvic pain.  Neurological: Negative for tremors and weakness.  Psychiatric/Behavioral: Positive for depression and  dysphoric mood. Negative for agitation, behavioral problems, confusion, decreased concentration, hallucinations, self-injury, sleep disturbance and suicidal ideas. The patient is nervous/anxious. The patient is not hyperactive.     Medications: I have reviewed the patient's current medications.  Current Outpatient Medications  Medication Sig Dispense Refill  . albuterol (PROAIR HFA) 108 (90 BASE) MCG/ACT inhaler 2 puffs every 4 hours as needed only  if your can't catch your breath 1 Inhaler 1  . amLODipine (NORVASC) 5 MG tablet Take 1 tablet (5 mg total) by mouth daily. 90 tablet 1  . B Complex Vitamins (B-COMPLEX/B-12) LIQD Place 5 mLs under the tongue daily at 2 PM daily at 2 PM.    . celecoxib (CELEBREX) 200 MG capsule Take 200 mg by mouth daily as needed.   2  . Cholecalciferol (VITAMIN D) 125 MCG (5000 UT) CAPS Take 5,000 Units by mouth daily.     Marland Kitchen estradiol (ESTRACE) 0.5 MG tablet Take 1 tablet (0.5 mg total) by mouth daily. 30 tablet 3  . fexofenadine (ALLEGRA) 180 MG tablet Take 180 mg by mouth daily.    Marland Kitchen gabapentin (NEURONTIN) 800 MG tablet Take 800 mg by mouth 4 (four) times daily as needed.     . hydrochlorothiazide (HYDRODIURIL) 12.5 MG tablet Take 12.5 mg by mouth daily.     Marland Kitchen LORazepam (ATIVAN) 1 MG tablet Take 1  tablet (1 mg total) by mouth daily as needed. 30 tablet 0  . mometasone (NASONEX) 50 MCG/ACT nasal spray Place 2 sprays into the nose daily. 17 g 3  . montelukast (SINGULAIR) 10 MG tablet TAKE 1 TABLET BY MOUTH ONCE DAILY 90 tablet 0  . nebivolol (BYSTOLIC) 10 MG tablet Take 1 tablet (10 mg total) by mouth 2 (two) times daily. 180 tablet 0  . NP THYROID 120 MG tablet Take 120 mg by mouth daily before breakfast.    . pantoprazole (PROTONIX) 40 MG tablet TAKE 1 TABLET BY MOUTH TWICE A DAY 60 tablet 2  . progesterone (PROMETRIUM) 200 MG capsule Take 2 capsules (400 mg total) by mouth every evening. (Patient taking differently: Take 200 mg by mouth every evening. ) 60  capsule 3  . sertraline (ZOLOFT) 100 MG tablet TAKE 1 TABLET BY MOUTH TWICE A DAY 180 tablet 2  . Travoprost, BAK Free, (TRAVATAN) 0.004 % SOLN ophthalmic solution Place 1 drop into both eyes at bedtime.    Marland Kitchen zolpidem (AMBIEN) 10 MG tablet TAKE 1/2-1 TABLET BY MOUTH AT BEDTIME AS NEEDED 30 tablet 5  . topiramate (TOPAMAX) 25 MG tablet Take 2 tablets (50 mg total) by mouth 2 (two) times daily. 120 tablet 1   No current facility-administered medications for this visit.    Medication Side Effects: Other: ? sweating related.  Allergies:  Allergies  Allergen Reactions  . Levaquin [Levofloxacin] Other (See Comments)    MUSCLE PAIN  . Nickel Rash    Past Medical History:  Diagnosis Date  . Allergy   . Anxiety   . Bronchiectasis   . Bronchitis    chronic  . 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 COPD (chronic obstructive pulmonary disease) ([place])   . Dental crown present    upper  . Depression   . Environmental allergies    receives allergy shots  . GERD (gastroesophageal reflux disease)   . 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 - no current med.  . Glucose intolerance (impaired glucose tolerance)    on Metformin  . H/O hiatal hernia    "sliding"  . History of echocardiogram    Echo 1/16: EF 60-65, no RWMA, Gr 1 DD, mild LAE, trivial pericardial eff post to heart  . History of echocardiogram    Echo 11/17: EF 55-60, no RWMA, Gr 1 DD, normal RV function.  Marland Kitchen History of palpitations    Holter 12/15: NSR, PACs  . Hx of migraines   . Hypothyroidism   . Osteoarthritis    knees  . Positional headache since 1997   if lies on left side    Family History  Problem Relation Age of Onset  . Lung cancer Father 93       dies age 72  . Emphysema Father   . COPD Brother 39       died age 62  . Stroke Mother   . Hypertension Sister   . Thyroid disease Sister        Graves Disease    Social History   Socioeconomic History  . Marital status: Widowed    Spouse  name: Not on file  . Number of children: Not on file  . Years of education: Not on file  . Highest education level: Not on file  Occupational History  . Occupation: Land    Employer: [employer]  Tobacco Use  . Smoking status: Never Smoker  . Smokeless  tobacco: Never Used  Substance and Sexual Activity  . Alcohol use: Yes    Alcohol/week: 4.0 standard drinks    Types: 4 Glasses of wine per week  . Drug use: No  . Sexual activity: Yes    Partners: Male    Birth control/protection: Post-menopausal  Other Topics Concern  . Not on file  Social History Narrative   Widowed late 2019 after 40+ years of marriage   3 sons one daughter   Working as a Company secretary for [employer]   No/never tobacco no drug use 1 caffeinated beverage daily, occasional or rare glass of wine   Social Determinants of Health   Financial Resource Strain:   . Difficulty of Paying Living Expenses: Not on file  Food Insecurity:   . Worried About [name] in the Last Year: Not on file  . Ran Out of Food in the Last Year: Not on file  Transportation Needs:   . Lack of Transportation (Medical): Not on file  . Lack of Transportation (Non-Medical): Not on file  Physical Activity:   . Days of Exercise per Week: Not on file  . Minutes of Exercise per Session: Not on file  Stress:   . Feeling of Stress : Not on file  Social Connections:   . Frequency of Communication with Friends and Family: Not on file  . Frequency of Social Gatherings with Friends and Family: Not on file  . Attends Religious Services: Not on file  . Active Member of Clubs or Organizations: Not on file  . Attends Archivist Meetings: Not on file  . Marital Status: Not on file  Intimate Partner Violence:   . Fear of Current or Ex-Partner: Not on file  . Emotionally Abused: Not on file  . Physically Abused: Not on file  . Sexually Abused: Not on file    Past Medical History, Surgical history, Social  history, and Family history were reviewed and updated as appropriate.   Please see review of systems for further details on the patient's review from today.   Objective:   Physical Exam:  LMP 12/12/2005   Physical Exam Constitutional:      General: She is not in acute distress.    Appearance: She is well-developed. She is obese.  Musculoskeletal:        General: No deformity.  Neurological:     Mental Status: She is alert and oriented to person, place, and time.     Cranial Nerves: No dysarthria.     Coordination: Coordination normal.  Psychiatric:        Attention and Perception: Attention and perception normal. She does not perceive auditory or visual hallucinations.        Mood and Affect: Mood is anxious and depressed. Affect is not labile, blunt, angry, tearful or inappropriate.        Speech: Speech normal.        Behavior: Behavior normal. Behavior is cooperative.        Thought Content: Thought content normal. Thought content is not paranoid or delusional. Thought content does not include homicidal or suicidal ideation. Thought content does not include homicidal or suicidal plan.        Cognition and Memory: Cognition and memory normal.        Judgment: Judgment normal.     Comments: Some residual depression, anxiety and ongoing grief   Gained 100# while H sick.  Lab Review:     Component Value Date/Time  NA 140 11/19/2019 1706   K 4.7 11/19/2019 1706   CL 103 11/19/2019 1706   CO2 26 11/19/2019 1706   GLUCOSE 98 11/19/2019 1706   BUN 19 11/19/2019 1706   CREATININE 1.09 (H) 11/19/2019 1706   CALCIUM 9.6 11/19/2019 1706   PROT 6.9 11/19/2019 1706   ALBUMIN 4.4 02/05/2015 0850   AST 19 11/19/2019 1706   ALT 26 11/19/2019 1706   ALKPHOS 53 02/05/2015 0850   BILITOT 0.3 11/19/2019 1706   GFRNONAA 54 (L) 11/19/2019 1706   GFRAA 62 11/19/2019 1706       Component Value Date/Time   WBC 10.2 03/23/2018 1244   RBC 4.32 03/23/2018 1244   HGB 13.5 03/23/2018  1244   HCT 40.0 03/23/2018 1244   PLT 406.0 (H) 03/23/2018 1244   MCV 92.6 03/23/2018 1244   MCH 31.6 01/31/2018 1813   MCHC 33.9 03/23/2018 1244   RDW 13.9 03/23/2018 1244   LYMPHSABS 2.5 03/23/2018 1244   MONOABS 0.6 03/23/2018 1244   EOSABS 0.1 03/23/2018 1244   BASOSABS 0.1 03/23/2018 1244    No results found for: POCLITH, LITHIUM   No results found for: PHENYTOIN, PHENOBARB, VALPROATE, CBMZ   .res Assessment: Plan:    [name] [name] was seen today for follow-up, depression and anxiety.  Diagnoses and all orders for this visit:  Panic disorder with agoraphobia -     LORazepam (ATIVAN) 1 MG tablet; Take 1 tablet (1 mg total) by mouth daily as needed. -     topiramate (TOPAMAX) 25 MG tablet; Take 2 tablets (50 mg total) by mouth 2 (two) times daily.  Generalized anxiety disorder -     topiramate (TOPAMAX) 25 MG tablet; Take 2 tablets (50 mg total) by mouth 2 (two) times daily.  Major depressive disorder, recurrent episode, moderate (HCC)  Insomnia due to mental condition  Grief     Supportive therapy. Greater than 50% of 30 minface to face time with patient was spent on counseling and coordination of care. We discussed Disc the difference between depression and grief.  Her H would not talk about his cancer and wouldn't prepare her for his death. Supportive and grief work.  Rec therapeutic letter writing.    She is still dealing with a lot of grief with waves of depression and anxiety and at times easily overwhelmed but functional.  Continue sertraline back 200 mg daily Discussed the possibility this could be contributing to weight gain.  Realistically the only alternative would be fluoxetine and it is not as good of an anxiety med.  Her anxiety has been severe in the past.  I would advise against changing or reducing the dose at this time because she has recently had a little more depression and anxiety than baseline.  Her panic disorder is generally controlled but a little  worse than last time.  She is not using the lorazepam excessively.  She is taking it appropriately.  The diazepam is prescribed vaginally by her gynecologist for chronic pelvic pain and not being used. We discussed the short-term risks associated with benzodiazepines including sedation and increased fall risk among others.  Discussed long-term side effect risk including dependence, potential withdrawal symptoms, and the potential eventual dose-related risk of dementia.  Disc the risk of Ambien amnesia.  Prefer avoid stimulant weight loss meds per PCP.  Naltrexone vs topiramate.  She tolerated topiramate for a while in the past for migraine.  Topiramate 25 up to 50 mg BID and possibly higher. To help weight  and anxiety off label. Naltrexone or metformin  Seeing pain psychologist every other week.  Referred for vaginal/rectal pain doctor..  Pain resolved after injection. Tried support group grief without much help.  This appt was 30 mins.  FU 3 mos  Lynder Parents, MD, DFAPA  Please see After Visit Summary for patient specific instructions.  Future Appointments  Date Time Provider [place]  01/01/2020  4:00 PM Mcarthur Rossetti, MD [facility] None  01/07/2020  4:20 PM Doree Albee, MD [facility] None  01/23/2020  4:00 PM Baird Lyons D, MD [facility] None    No orders of the defined types were placed in this encounter.     -------------------------------

## 2019-12-26 NOTE — Patient Instructions (Signed)
1 topiramate 25 mg tablet 1 each morning for 1 week, Then 1 twice daily for 1 week, Then 1 in the morning and 2 in the evening for 1 week, Then 2 twice daily

## 2020-01-01 ENCOUNTER — Ambulatory Visit (INDEPENDENT_AMBULATORY_CARE_PROVIDER_SITE_OTHER): Payer: 59

## 2020-01-01 ENCOUNTER — Ambulatory Visit (INDEPENDENT_AMBULATORY_CARE_PROVIDER_SITE_OTHER): Payer: 59 | Admitting: Orthopaedic Surgery

## 2020-01-01 ENCOUNTER — Encounter: Payer: Self-pay | Admitting: Orthopaedic Surgery

## 2020-01-01 ENCOUNTER — Other Ambulatory Visit: Payer: Self-pay

## 2020-01-01 VITALS — Ht 61.0 in | Wt 239.0 lb

## 2020-01-01 DIAGNOSIS — M25552 Pain in left hip: Secondary | ICD-10-CM

## 2020-01-01 DIAGNOSIS — M1711 Unilateral primary osteoarthritis, right knee: Secondary | ICD-10-CM

## 2020-01-01 DIAGNOSIS — M1712 Unilateral primary osteoarthritis, left knee: Secondary | ICD-10-CM

## 2020-01-01 MED ORDER — LIDOCAINE HCL 1 % IJ SOLN
3.0000 mL | INTRAMUSCULAR | Status: AC | PRN
  Administered 2020-01-01: 3 mL

## 2020-01-01 MED ORDER — METHYLPREDNISOLONE ACETATE 40 MG/ML IJ SUSP
40.0000 mg | INTRAMUSCULAR | Status: AC | PRN
  Administered 2020-01-01: 17:00:00 40 mg via INTRA_ARTICULAR

## 2020-01-01 NOTE — Progress Notes (Signed)
Office Visit Note   Patient: [name] [name]           Date of Birth: June 03, 1956           MRN: [id number] Visit Date: 01/01/2020              Requested by: Doree Albee, MD [id number] [place],  [place] [address] PCP: Doree Albee, MD   Assessment & Plan: Visit Diagnoses:  1. Primary osteoarthritis of right knee   2. Pain in left hip   3. Primary osteoarthritis of left knee     Plan: Discussed weight loss with patient and how this could definitely affect her knees.  Have her perform IT band stretching for the left hip bursitis.  We will also obtain an MRI of her pelvis to evaluate soft tissues and pelvic for soft tissue given her left hip pain and pelvic floor muscle weakness.  She will follow-up with [place] 2 weeks after the MRI to go over results and discuss further treatment.  Questions were encouraged and answered by Dr. Ninfa Linden and myself.  Follow-Up Instructions: No follow-ups on file.   Orders:  Orders Placed This Encounter  Procedures  . XR HIP UNILAT W OR W/O PELVIS 1V LEFT  . XR Knee 1-2 Views Right   No orders of the defined types were placed in this encounter.     Procedures: Large Joint Inj: L greater trochanter on 01/01/2020 4:50 PM Indications: pain Details: 22 G 1.5 in needle, lateral approach  Arthrogram: No  Medications: 3 mL lidocaine 1 %; 40 mg methylPREDNISolone acetate 40 MG/ML Outcome: tolerated well, no immediate complications Procedure, treatment alternatives, risks and benefits explained, specific risks discussed. Consent was given by the patient. Immediately prior to procedure a time out was called to verify the correct patient, procedure, equipment, support staff and site/side marked as required. Patient was prepped and draped in the usual sterile fashion.       Clinical Data: No additional findings.   Subjective: No chief complaint on file.   HPI Patient is 64 year old female comes in today with left hip pain for several  months.  No known injury.  She is also had bilateral knee pain for many years but is been worse over the last 5 to 6 days especially in the right knee.  She has had 2 knee arthroscopies on the right knee 12/2011 12/2012 left knee arthroscopy 2014.  She reports some injections in her right knee in the past does not state whether the cortisone or supplemental.  Unfortunately she lost her husband recently and has been grieving from his death.  She states she has gained a lot of weight as a result of this.  However over the last months monthly she has been able to lose about 11 pounds.  She does note using a compression hose on the right leg is helped some with her pain. She notes that she has had some pelvic floor issues and actually has been to pelvic floor specialist and [facility].  She points to her buttocks region and states that she had an injection there by the pelvic floor specialist in September 2020.   Review of Systems No fevers chills.  Objective: Vital Signs: Ht 5\' 1"  (1.549 m)   Wt 239 lb (108.4 kg)   LMP 12/12/2005   BMI 45.16 kg/m   Physical Exam Constitutional:      Appearance: She is obese. She is not ill-appearing or diaphoretic.  Pulmonary:  Effort: Pulmonary effort is normal.  Neurological:     Mental Status: She is alert and oriented to person, place, and time.  Psychiatric:        Mood and Affect: Mood normal.     Ortho Exam Bilateral hips fluid range of motion without pain.  Left hip tenderness over the trochanteric region.  Bilateral knees good range of motion of both knees patellofemoral crepitus right greater than left.  Tenderness along medial joint line of both knees.  Slight edema right knee.  No instability valgus varus stressing of either knee. Specialty Comments:  No specialty comments available.  Imaging: XR HIP UNILAT W OR W/O PELVIS 1V LEFT  Result Date: 01/01/2020 AP pelvis lateral view left hip: No acute fractures.  Bilateral hips well  maintained.  Both hips well located.  No bony abnormalities otherwise.  XR Knee 1-2 Views Right  Result Date: 01/01/2020 Right knee AP and lateral views: AP view shows both knees with medial compartmental narrowing near bone-on-bone right knee medial compartment.  Mild to moderate patellofemoral changes right knee.  No acute fractures.  Knee is well located.    PMFS History: Patient Active Problem List   Diagnosis Date Noted  . Black stools 08/14/2019  . 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  . 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  . Anxiety 12/09/2018  . Obstructive sleep apnea 03/23/2018  . Fever 03/23/2018  . Chronic fatigue 02/28/2018  . Essential hypertension 02/17/2018  . Morbid obesity ([place]) 02/17/2018  . Adrenal disorder ([place]) 01/29/2015  . Palpable abdominal aorta 01/01/2015  . Heart palpitations 01/01/2015  . Hypothyroidism 01/01/2015  . Seasonal and perennial allergic rhinitis 07/02/2012  . Dyspnea 05/25/2012  . GERD (gastroesophageal reflux disease) 05/16/2012  . Chronic cough 05/16/2012  . Asthma with bronchitis 05/16/2012   Past Medical History:  Diagnosis Date  . Allergy   . Anxiety   . Bronchiectasis   . Bronchitis    chronic  . 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 COPD (chronic obstructive pulmonary disease) ([place])   . Dental crown present    upper  . Depression   . Environmental allergies    receives allergy shots  . GERD (gastroesophageal reflux disease)   . 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 - no current med.  . Glucose intolerance (impaired glucose tolerance)    on Metformin  . H/O hiatal hernia    "sliding"  . History of echocardiogram    Echo 1/16: EF 60-65, no RWMA, Gr 1 DD, mild LAE, trivial pericardial eff post to heart  . History of echocardiogram    Echo 11/17: EF 55-60, no RWMA, Gr 1 DD, normal RV function.  Marland Kitchen History of palpitations    Holter 12/15: NSR, PACs  . Hx of migraines   . Hypothyroidism   .  Osteoarthritis    knees  . Positional headache since 1997   if lies on left side    Family History  Problem Relation Age of Onset  . Lung cancer Father 74       dies age 49  . Emphysema Father   . COPD Brother 47       died age 95  . Stroke Mother   . Hypertension Sister   . Thyroid disease Sister        Graves Disease    Past Surgical History:  Procedure Laterality Date  . CHOLECYSTECTOMY  04/21/2011  . CHONDROPLASTY  10/19/2012   Procedure: CHONDROPLASTY;  Surgeon: Yvette Rack., MD;  Location: [facility];  Service: Orthopedics;  Laterality: Right;  . COLONOSCOPY    . KNEE ARTHROSCOPY  10/19/2012   Procedure: ARTHROSCOPY KNEE;  Surgeon: Yvette Rack., MD;  Location: [facility];  Service: Orthopedics;  Laterality: Right;  . KNEE ARTHROSCOPY Right 02/01/2013   Procedure: RIGHT KNEE ARTHROSCOPY WITH MEDIAL MENISCECTOMY, DEBRIDEMENT CHONDROMALACIA PATELLA;  Surgeon: Yvette Rack., MD;  Location: [facility];  Service: Orthopedics;  Laterality: Right;  RIGHT KNEE ARTHROSCOPY WITH MEDIAL MENISCECTOMY, DEBRIDEMENT CHONDROMALACIA PATELLA  . KNEE ARTHROSCOPY Left 03/15/2013   Procedure: LEFT KNEE ARTHROSCOPY WITH DEBRIDEMENT/SHAVING CHONDROPLASTY WITH MEDIAL MENISECTOMY;  Surgeon: Yvette Rack., MD;  Location: [facility];  Service: Orthopedics;  Laterality: Left;  . KNEE ARTHROSCOPY WITH LATERAL MENISECTOMY  10/19/2012   Procedure: KNEE ARTHROSCOPY WITH LATERAL MENISECTOMY;  Surgeon: Yvette Rack., MD;  Location: [facility];  Service: Orthopedics;  Laterality: Right;  . TONSILLECTOMY AND ADENOIDECTOMY     age 60  . TUBAL LIGATION  1986  . WISDOM TOOTH EXTRACTION     Social History   Occupational History  . Occupation: Land    Employer: [employer]  Tobacco Use  . Smoking status: Never Smoker  . Smokeless tobacco: Never Used  Substance and Sexual Activity  . Alcohol use: Yes    Alcohol/week: 4.0  standard drinks    Types: 4 Glasses of wine per week  . Drug use: No  . Sexual activity: Yes    Partners: Male    Birth control/protection: Post-menopausal

## 2020-01-02 ENCOUNTER — Other Ambulatory Visit: Payer: Self-pay | Admitting: Radiology

## 2020-01-02 DIAGNOSIS — M25552 Pain in left hip: Secondary | ICD-10-CM

## 2020-01-07 ENCOUNTER — Ambulatory Visit (INDEPENDENT_AMBULATORY_CARE_PROVIDER_SITE_OTHER): Payer: 59 | Admitting: Internal Medicine

## 2020-01-09 ENCOUNTER — Other Ambulatory Visit (INDEPENDENT_AMBULATORY_CARE_PROVIDER_SITE_OTHER): Payer: Self-pay | Admitting: Nurse Practitioner

## 2020-01-09 MED FILL — ESTRADIOL 0.5 MG TABS: 0.5 | 30 days supply | Qty: 30 | Fill #1

## 2020-01-09 MED FILL — GABAPENTIN 800 MG TABS: 800 | 30 days supply | Qty: 120 | Fill #3

## 2020-01-09 MED FILL — NP THYROID 120 MG TABLET: 120 | 30 days supply | Qty: 30 | Fill #2

## 2020-01-09 MED FILL — PROGESTERONE MICRONIZED 200: 200 | 30 days supply | Qty: 60 | Fill #1

## 2020-01-09 MED FILL — HYDROCHLOROTHIAZIDE 12.5 MG: 12.5 | 90 days supply | Qty: 90 | Fill #0

## 2020-01-09 MED FILL — PANTOPRAZOLE SOD DR 40 MG T: 40 | 30 days supply | Qty: 60 | Fill #2

## 2020-01-09 MED FILL — MOMETASONE FUROATE 50 MCG S: 50 | 30 days supply | Qty: 17 | Fill #2

## 2020-01-13 MED FILL — ZOLPIDEM TARTRATE 10 MG TAB: 10 | 30 days supply | Qty: 30 | Fill #5

## 2020-01-15 ENCOUNTER — Ambulatory Visit: Payer: 59 | Admitting: Orthopaedic Surgery

## 2020-01-23 ENCOUNTER — Ambulatory Visit: Payer: 59 | Admitting: Pulmonary Disease

## 2020-01-23 ENCOUNTER — Ambulatory Visit: Payer: 59 | Admitting: Internal Medicine

## 2020-02-04 ENCOUNTER — Encounter: Payer: Self-pay | Admitting: *Deleted

## 2020-02-14 ENCOUNTER — Other Ambulatory Visit: Payer: Self-pay | Admitting: Psychiatry

## 2020-02-14 MED FILL — ZOLPIDEM TARTRATE 10 MG TAB: 10 | 30 days supply | Qty: 30 | Fill #0

## 2020-02-19 ENCOUNTER — Ambulatory Visit (INDEPENDENT_AMBULATORY_CARE_PROVIDER_SITE_OTHER): Payer: 59 | Admitting: Psychiatry

## 2020-02-19 ENCOUNTER — Other Ambulatory Visit: Payer: Self-pay

## 2020-02-19 ENCOUNTER — Encounter: Payer: Self-pay | Admitting: Psychiatry

## 2020-02-19 DIAGNOSIS — F5105 Insomnia due to other mental disorder: Secondary | ICD-10-CM | POA: Diagnosis not present

## 2020-02-19 DIAGNOSIS — F411 Generalized anxiety disorder: Secondary | ICD-10-CM

## 2020-02-19 DIAGNOSIS — F4001 Agoraphobia with panic disorder: Secondary | ICD-10-CM | POA: Diagnosis not present

## 2020-02-19 DIAGNOSIS — F4329 Adjustment disorder with other symptoms: Secondary | ICD-10-CM

## 2020-02-19 DIAGNOSIS — F4321 Adjustment disorder with depressed mood: Secondary | ICD-10-CM

## 2020-02-19 DIAGNOSIS — F332 Major depressive disorder, recurrent severe without psychotic features: Secondary | ICD-10-CM

## 2020-02-19 MED ORDER — ARIPIPRAZOLE 5 MG PO TABS: 5.0000 mg | ORAL_TABLET | Freq: Every day | ORAL | 0 refills | Status: DC

## 2020-02-19 MED ORDER — TOPIRAMATE 25 MG PO TABS: 50.0000 mg | ORAL_TABLET | Freq: Two times a day (BID) | ORAL | 1 refills | Status: DC

## 2020-02-19 MED ORDER — SERTRALINE HCL 100 MG PO TABS: 300.0000 mg | ORAL_TABLET | Freq: Every day | ORAL | 0 refills | Status: DC

## 2020-02-19 MED FILL — TOPIRAMATE 25 MG TABLET: 25 | 30 days supply | Qty: 120 | Fill #0

## 2020-02-19 NOTE — Progress Notes (Addendum)
SHAWNY DORSETT [id number] 05-10-1956 64 y.o.  Subjective:   Patient ID:  [name] [name] is a 64 y.o. (DOB 1956/01/29) female.  Chief Complaint:  Chief Complaint  Patient presents with  . Follow-up     Medication Management  . Depression     Medication Management  . Anxiety    Depression        Associated symptoms include no decreased concentration and no suicidal ideas. Medication Refill Pertinent negatives include no weakness.   HOLLIN ATHA presents to the office today for follow-up of anxiety.  seen April 2020.  She was dealing with grief and depression and sertraline was increased back to 200 mg daily.  [name] died in 11/01/18  from cancer.  Married 41 years.  Is able to focus on positive memories.  Not ready to move.  seen August 2020.  No meds were changed.  She continued sertraline 200 mg daily.  Last seen January 2020.  Sertraline was unchanged.  She was having still grief issues related to the loss of her husband.  She was also having problems with weight attributed to the sertraline.  She was given a trial of topiramate to try to help offset some of the weight gain plus it can have some potential antianxiety effects.  Very depressed. So sad.  Don't want to get OOB.  Got lost coming here and late.  Worse for 3-4 weeks.  Days off are terrible.  Barely able to function at work.  Cry all the way home after work.  So lonely.  Kids aren't coming like they were bc they are busy.  Need to figure out her own life.  Life was centered around H and kids.  Denies suicidal thoughts    More depressed days. Low energy.   Neglecting things. More minor panic with more Ativan but not often.   Few visitiors.  Boys mow her place.  D invited her to dinner occ..  Low motivation and energy.   Some anger with it too.   Gaining weight PCP not supportive of use of weight loss meds.    Past Psychiatric Medication Trials:  Zoloft 300, Lexapro,, paroxetine Paliperidone, olanzapine,  aripiprazole 20 mg, buspirone, clonidine, topiramate, gabapentin, metformin, phentermine, trazodone, Ambien  Ativan,  Remote history topiramate for migraine 1998, Belviq helped lose 85#  Review of Systems:  Review of Systems  Constitutional: Positive for unexpected weight change.       Sweating  Genitourinary: Negative for pelvic pain.  Neurological: Negative for tremors and weakness.  Psychiatric/Behavioral: Positive for depression and dysphoric mood. Negative for agitation, behavioral problems, confusion, decreased concentration, hallucinations, self-injury, sleep disturbance and suicidal ideas. The patient is nervous/anxious. The patient is not hyperactive.     Medications: I have reviewed the patient's current medications.  Current Outpatient Medications  Medication Sig Dispense Refill  . albuterol (PROAIR HFA) 108 (90 BASE) MCG/ACT inhaler 2 puffs every 4 hours as needed only  if your can't catch your breath 1 Inhaler 1  . amLODipine (NORVASC) 5 MG tablet Take 1 tablet (5 mg total) by mouth daily. 90 tablet 1  . B Complex Vitamins (B-COMPLEX/B-12) LIQD Place 5 mLs under the tongue daily at 2 PM daily at 2 PM.    . celecoxib (CELEBREX) 200 MG capsule Take 200 mg by mouth daily as needed.   2  . Cholecalciferol (VITAMIN D) 125 MCG (5000 UT) CAPS Take 5,000 Units by mouth daily.     Marland Kitchen estradiol (ESTRACE) 0.5  MG tablet Take 1 tablet (0.5 mg total) by mouth daily. 30 tablet 3  . fexofenadine (ALLEGRA) 180 MG tablet Take 180 mg by mouth daily.    Marland Kitchen gabapentin (NEURONTIN) 800 MG tablet Take 800 mg by mouth 4 (four) times daily as needed.     . hydrochlorothiazide (HYDRODIURIL) 12.5 MG tablet TAKE 1 TABLET BY MOUTH ONCE DAILY 90 tablet 0  . LORazepam (ATIVAN) 1 MG tablet Take 1 tablet (1 mg total) by mouth daily as needed. 30 tablet 0  . mometasone (NASONEX) 50 MCG/ACT nasal spray Place 2 sprays into the nose daily. 17 g 3  . montelukast (SINGULAIR) 10 MG tablet TAKE 1 TABLET BY MOUTH ONCE  DAILY 90 tablet 0  . nebivolol (BYSTOLIC) 10 MG tablet Take 1 tablet (10 mg total) by mouth 2 (two) times daily. 180 tablet 0  . NP THYROID 120 MG tablet Take 120 mg by mouth daily before breakfast.    . pantoprazole (PROTONIX) 40 MG tablet TAKE 1 TABLET BY MOUTH TWICE A DAY 60 tablet 2  . progesterone (PROMETRIUM) 200 MG capsule Take 2 capsules (400 mg total) by mouth every evening. (Patient taking differently: Take 200 mg by mouth every evening. ) 60 capsule 3  . sertraline (ZOLOFT) 100 MG tablet Take 3 tablets (300 mg total) by mouth daily. TAKE 1 TABLET BY MOUTH TWICE A DAY 270 tablet 0  . topiramate (TOPAMAX) 25 MG tablet Take 2 tablets (50 mg total) by mouth 2 (two) times daily. 120 tablet 1  . Travoprost, BAK Free, (TRAVATAN) 0.004 % SOLN ophthalmic solution Place 1 drop into both eyes at bedtime.    Marland Kitchen zolpidem (AMBIEN) 10 MG tablet TAKE 1/2-1 TABLET BY MOUTH AT BEDTIME AS NEEDED 30 tablet 5   No current facility-administered medications for this visit.    Medication Side Effects: Other: ? sweating related.  Allergies:  Allergies  Allergen Reactions  . Levaquin [Levofloxacin] Other (See Comments)    MUSCLE PAIN  . Nickel Rash    Past Medical History:  Diagnosis Date  . Allergy   . Anxiety   . Bronchiectasis   . Bronchitis    chronic  . 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 COPD (chronic obstructive pulmonary disease) ([place])   . Dental crown present    upper  . Depression   . Environmental allergies    receives allergy shots  . GERD (gastroesophageal reflux disease)   . 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 - no current med.  . Glucose intolerance (impaired glucose tolerance)    on Metformin  . H/O hiatal hernia    "sliding"  . History of echocardiogram    Echo 1/16: EF 60-65, no RWMA, Gr 1 DD, mild LAE, trivial pericardial eff post to heart  . History of echocardiogram    Echo 11/17: EF 55-60, no RWMA, Gr 1 DD, normal RV function.  Marland Kitchen History of  palpitations    Holter 12/15: NSR, PACs  . Hx of migraines   . Hypothyroidism   . Osteoarthritis    knees  . Positional headache since 1997   if lies on left side    Family History  Problem Relation Age of Onset  . Lung cancer Father 24       dies age 2  . Emphysema Father   . COPD Brother 58       died age 45  . Stroke Mother   . Hypertension Sister   .  Thyroid disease Sister        Graves Disease    Social History   Socioeconomic History  . Marital status: Widowed    Spouse name: Not on file  . Number of children: Not on file  . Years of education: Not on file  . Highest education level: Not on file  Occupational History  . Occupation: Land    Employer: [employer]  Tobacco Use  . Smoking status: Never Smoker  . Smokeless tobacco: Never Used  Substance and Sexual Activity  . Alcohol use: Yes    Alcohol/week: 4.0 standard drinks    Types: 4 Glasses of wine per week  . Drug use: No  . Sexual activity: Yes    Partners: Male    Birth control/protection: Post-menopausal  Other Topics Concern  . Not on file  Social History Narrative   Widowed late 2019 after 40+ years of marriage   3 sons one daughter   Working as a Company secretary for [employer]   No/never tobacco no drug use 1 caffeinated beverage daily, occasional or rare glass of wine   Social Determinants of Health   Financial Resource Strain:   . Difficulty of Paying Living Expenses: Not on file  Food Insecurity:   . Worried About [name] in the Last Year: Not on file  . Ran Out of Food in the Last Year: Not on file  Transportation Needs:   . Lack of Transportation (Medical): Not on file  . Lack of Transportation (Non-Medical): Not on file  Physical Activity:   . Days of Exercise per Week: Not on file  . Minutes of Exercise per Session: Not on file  Stress:   . Feeling of Stress : Not on file  Social Connections:   . Frequency of Communication with Friends and  Family: Not on file  . Frequency of Social Gatherings with Friends and Family: Not on file  . Attends Religious Services: Not on file  . Active Member of Clubs or Organizations: Not on file  . Attends Archivist Meetings: Not on file  . Marital Status: Not on file  Intimate Partner Violence:   . Fear of Current or Ex-Partner: Not on file  . Emotionally Abused: Not on file  . Physically Abused: Not on file  . Sexually Abused: Not on file    Past Medical History, Surgical history, Social history, and Family history were reviewed and updated as appropriate.   Please see review of systems for further details on the patient's review from today.   Objective:   Physical Exam:  LMP 12/12/2005   Physical Exam Constitutional:      General: She is not in acute distress.    Appearance: She is well-developed. She is obese.  Musculoskeletal:        General: No deformity.  Neurological:     Mental Status: She is alert and oriented to person, place, and time.     Cranial Nerves: No dysarthria.     Coordination: Coordination normal.  Psychiatric:        Attention and Perception: Perception normal. She is inattentive. She does not perceive auditory or visual hallucinations.        Mood and Affect: Mood is anxious and depressed. Affect is tearful. Affect is not labile, blunt, angry or inappropriate.        Speech: Speech normal.        Behavior: Behavior normal. Behavior is cooperative.  Thought Content: Thought content normal. Thought content is not paranoid or delusional. Thought content does not include homicidal or suicidal ideation. Thought content does not include homicidal or suicidal plan.        Cognition and Memory: Cognition and memory normal.     Comments: Markedly worse. Sobbing.  Irritable. Angry inside. Fair insight and judgment   Gained 100# while H sick.  Lab Review:     Component Value Date/Time   NA 140 11/19/2019 1706   K 4.7 11/19/2019 1706   CL  103 11/19/2019 1706   CO2 26 11/19/2019 1706   GLUCOSE 98 11/19/2019 1706   BUN 19 11/19/2019 1706   CREATININE 1.09 (H) 11/19/2019 1706   CALCIUM 9.6 11/19/2019 1706   PROT 6.9 11/19/2019 1706   ALBUMIN 4.4 02/05/2015 0850   AST 19 11/19/2019 1706   ALT 26 11/19/2019 1706   ALKPHOS 53 02/05/2015 0850   BILITOT 0.3 11/19/2019 1706   GFRNONAA 54 (L) 11/19/2019 1706   GFRAA 62 11/19/2019 1706       Component Value Date/Time   WBC 10.2 03/23/2018 1244   RBC 4.32 03/23/2018 1244   HGB 13.5 03/23/2018 1244   HCT 40.0 03/23/2018 1244   PLT 406.0 (H) 03/23/2018 1244   MCV 92.6 03/23/2018 1244   MCH 31.6 01/31/2018 1813   MCHC 33.9 03/23/2018 1244   RDW 13.9 03/23/2018 1244   LYMPHSABS 2.5 03/23/2018 1244   MONOABS 0.6 03/23/2018 1244   EOSABS 0.1 03/23/2018 1244   BASOSABS 0.1 03/23/2018 1244    No results found for: POCLITH, LITHIUM   No results found for: PHENYTOIN, PHENOBARB, VALPROATE, CBMZ   .res Assessment: Plan:    Markyla was seen today for follow-up, depression and anxiety.  Diagnoses and all orders for this visit:  Severe episode of recurrent major depressive disorder, without psychotic features ([place]) -     Discontinue: ARIPiprazole (ABILIFY) 5 MG tablet; Take 1 tablet (5 mg total) by mouth daily. -     sertraline (ZOLOFT) 100 MG tablet; Take 3 tablets (300 mg total) by mouth daily. TAKE 1 TABLET BY MOUTH TWICE A DAY  Complicated grief -     sertraline (ZOLOFT) 100 MG tablet; Take 3 tablets (300 mg total) by mouth daily. TAKE 1 TABLET BY MOUTH TWICE A DAY  Panic disorder with agoraphobia -     sertraline (ZOLOFT) 100 MG tablet; Take 3 tablets (300 mg total) by mouth daily. TAKE 1 TABLET BY MOUTH TWICE A DAY -     topiramate (TOPAMAX) 25 MG tablet; Take 2 tablets (50 mg total) by mouth 2 (two) times daily.  Generalized anxiety disorder -     sertraline (ZOLOFT) 100 MG tablet; Take 3 tablets (300 mg total) by mouth daily. TAKE 1 TABLET BY MOUTH TWICE A DAY -      topiramate (TOPAMAX) 25 MG tablet; Take 2 tablets (50 mg total) by mouth 2 (two) times daily.  Insomnia due to mental condition     Supportive therapy. Greater than 50% of 30 minface to face time with patient was spent on counseling and coordination of care. We discussed Disc the difference between depression and grief.  Her H would not talk about his cancer and wouldn't prepare her for his death. Supportive and grief work.  Rec therapeutic letter writing.    She is still dealing with a lot of grief with waves of depression and anxiety and at times easily overwhelmed but functional. Consider switch to SNRI, Abilify  retry bc when took it before her problem was anxiety more than depression.  Increase sertraline back to 300 mg daily.  She's aware it's higher than the expected usual dose. Rexulti 2 mg daily Discussed the possibility this could be contributing to weight gain.  Realistically the only alternative would be fluoxetine and it is not as good of an anxiety med.  Her anxiety has been severe in the past.  I would advise against changing or reducing the dose at this time because she has recently had a little more depression and anxiety than baseline.  Her panic disorder is generally controlled but a little worse than last time.  She is not using the lorazepam excessively.  She is taking it appropriately.  The diazepam is prescribed vaginally by her gynecologist for chronic pelvic pain and not being used. We discussed the short-term risks associated with benzodiazepines including sedation and increased fall risk among others.  Discussed long-term side effect risk including dependence, potential withdrawal symptoms, and the potential eventual dose-related risk of dementia.  Disc the risk of Ambien amnesia.  Prefer avoid stimulant weight loss meds per PCP.  Naltrexone vs topiramate.  She tolerated topiramate for a while in the past for migraine.  Topiramate increase to target 50 mg BID and  possibly higher. To help weight and anxiety off label. Naltrexone or metformin or Ozempic options.  Seeing pain psychologist every other week.  Referred for vaginal/rectal pain doctor..  Pain resolved after injection. Tried support group grief without much help.  This appt was 30 mins.  FU 3 weeks  Lynder Parents, MD, DFAPA  Please see After Visit Summary for patient specific instructions.  Increase sertraline to 300 mg daily. Start Rexulti 2 mg daily  If not better by Friday March 19, call the office.  Increase topiramate to 2 of the 25 mg tablets twice daily.  Future Appointments  Date Time Provider [place]  03/09/2020  4:00 PM Deneise Lever, MD [facility] None  03/31/2020  4:15 PM Cottle, Billey Co., MD [facility] None    No orders of the defined types were placed in this encounter.     -------------------------------

## 2020-02-19 NOTE — Patient Instructions (Addendum)
Increase sertraline to 300 mg daily. Start Rexulti 2 mg daily  If not better by Friday March 19, call the office.  Increase topiramate to 2 of the 25 mg tablets twice daily.

## 2020-02-20 ENCOUNTER — Other Ambulatory Visit: Payer: Self-pay

## 2020-02-20 ENCOUNTER — Telehealth: Payer: Self-pay | Admitting: Orthopaedic Surgery

## 2020-02-20 ENCOUNTER — Other Ambulatory Visit (INDEPENDENT_AMBULATORY_CARE_PROVIDER_SITE_OTHER): Payer: Self-pay | Admitting: Internal Medicine

## 2020-02-20 DIAGNOSIS — F411 Generalized anxiety disorder: Secondary | ICD-10-CM

## 2020-02-20 DIAGNOSIS — F4329 Adjustment disorder with other symptoms: Secondary | ICD-10-CM

## 2020-02-20 DIAGNOSIS — F4001 Agoraphobia with panic disorder: Secondary | ICD-10-CM

## 2020-02-20 DIAGNOSIS — F4321 Adjustment disorder with depressed mood: Secondary | ICD-10-CM

## 2020-02-20 DIAGNOSIS — F332 Major depressive disorder, recurrent severe without psychotic features: Secondary | ICD-10-CM

## 2020-02-20 MED ORDER — SERTRALINE HCL 100 MG PO TABS: 300.0000 mg | ORAL_TABLET | Freq: Every day | ORAL | 0 refills | Status: DC

## 2020-02-20 MED FILL — NP THYROID 120 MG TABLET: 120 | 30 days supply | Qty: 30 | Fill #0

## 2020-02-20 MED FILL — SERTRALINE HCL 100 MG TAB: 100 | 90 days supply | Qty: 270 | Fill #0

## 2020-03-02 ENCOUNTER — Telehealth: Payer: Self-pay | Admitting: Psychiatry

## 2020-03-02 NOTE — Telephone Encounter (Signed)
Arvena called to report that the [place] has helped.  Maybe not at 100% but is better.  Please call in prescription to [facility].  Appt 4/7

## 2020-03-03 ENCOUNTER — Other Ambulatory Visit: Payer: Self-pay

## 2020-03-03 MED ORDER — BREXPIPRAZOLE 2 MG PO TABS: 2.0000 mg | ORAL_TABLET | Freq: Every day | ORAL | 0 refills | Status: DC

## 2020-03-03 MED FILL — REXULTI 2 MG TABLET: 2 | 30 days supply | Qty: 30 | Fill #0

## 2020-03-03 NOTE — Telephone Encounter (Signed)
Yes submit Rexulti 2 mg #30 no refills to pharmacy.  Thank you.

## 2020-03-03 NOTE — Telephone Encounter (Signed)
RX sent

## 2020-03-05 ENCOUNTER — Encounter: Payer: Self-pay | Admitting: Radiology

## 2020-03-09 ENCOUNTER — Ambulatory Visit: Payer: 59 | Admitting: Internal Medicine

## 2020-03-10 NOTE — Telephone Encounter (Signed)
disregard

## 2020-03-13 ENCOUNTER — Ambulatory Visit (HOSPITAL_COMMUNITY): Admission: RE | Admit: 2020-03-13 | Payer: 59 | Source: Ambulatory Visit

## 2020-03-13 ENCOUNTER — Other Ambulatory Visit (INDEPENDENT_AMBULATORY_CARE_PROVIDER_SITE_OTHER): Payer: Self-pay | Admitting: Internal Medicine

## 2020-03-13 DIAGNOSIS — I10 Essential (primary) hypertension: Secondary | ICD-10-CM

## 2020-03-13 MED FILL — PANTOPRAZOLE SOD DR 40 MG T: 40 | 30 days supply | Qty: 60 | Fill #0

## 2020-03-13 MED FILL — ZOLPIDEM TARTRATE 10 MG TAB: 10 | 30 days supply | Qty: 30 | Fill #1

## 2020-03-13 MED FILL — AMLODIPINE BESYLATE 5 MG TA: 5 | 90 days supply | Qty: 90 | Fill #0

## 2020-03-13 MED FILL — PROGESTERONE MICRONIZED 200: 200 | 30 days supply | Qty: 60 | Fill #2

## 2020-03-13 MED FILL — BYSTOLIC 10 MG TABLET: 10 | 90 days supply | Qty: 180 | Fill #1

## 2020-03-17 MED FILL — MOMETASONE FUROATE 50 MCG S: 50 | 30 days supply | Qty: 17 | Fill #3

## 2020-03-18 ENCOUNTER — Ambulatory Visit (INDEPENDENT_AMBULATORY_CARE_PROVIDER_SITE_OTHER): Payer: 59 | Admitting: Psychiatry

## 2020-03-18 ENCOUNTER — Encounter: Payer: Self-pay | Admitting: Psychiatry

## 2020-03-18 ENCOUNTER — Other Ambulatory Visit: Payer: Self-pay

## 2020-03-18 DIAGNOSIS — F411 Generalized anxiety disorder: Secondary | ICD-10-CM | POA: Diagnosis not present

## 2020-03-18 DIAGNOSIS — F4329 Adjustment disorder with other symptoms: Secondary | ICD-10-CM | POA: Diagnosis not present

## 2020-03-18 DIAGNOSIS — F332 Major depressive disorder, recurrent severe without psychotic features: Secondary | ICD-10-CM | POA: Diagnosis not present

## 2020-03-18 DIAGNOSIS — F5105 Insomnia due to other mental disorder: Secondary | ICD-10-CM | POA: Diagnosis not present

## 2020-03-18 DIAGNOSIS — F4001 Agoraphobia with panic disorder: Secondary | ICD-10-CM | POA: Diagnosis not present

## 2020-03-18 DIAGNOSIS — F4321 Adjustment disorder with depressed mood: Secondary | ICD-10-CM

## 2020-03-18 MED FILL — ESTRADIOL 0.5 MG TABS: 0.5 | 30 days supply | Qty: 30 | Fill #2

## 2020-03-18 NOTE — Progress Notes (Signed)
[name] [name] [id number] December 21, 1955 64 y.o.  Subjective:   Patient ID:  [name] [name] is a 64 y.o. (DOB June 27, 1956) female.  Chief Complaint:  Chief Complaint  Patient presents with  . Depression  . Anxiety  . Eating Disorder  . Follow-up    Med changes    Depression        Associated symptoms include myalgias.  Associated symptoms include no decreased concentration and no suicidal ideas. Medication Refill Associated symptoms include myalgias. Pertinent negatives include no weakness.   [name] [name] presents to the office today for follow-up of anxiety.  seen April 2020.  She was dealing with grief and depression and sertraline was increased back to 200 mg daily.  [name] [name] died in 10-20-2018  from cancer.  Married 41 years.  Is able to focus on positive memories.  Not ready to move.  seen August 2020.  No meds were changed.  She continued sertraline 200 mg daily.  seen January 2020.  Sertraline was unchanged.  She was having still grief issues related to the loss of her husband.  She was also having problems with weight attributed to the sertraline.  She was given a trial of topiramate to try to help offset some of the weight gain plus it can have some potential antianxiety effects.  Last seen 02/19/20 stating: Very depressed. So sad.  Don't want to get OOB.  Got lost coming here and late.  Worse for 3-4 weeks.  Days off are terrible.  Barely able to function at work.  Cry all the way home after work.  So lonely.  Kids aren't coming like they were bc they are busy.  Need to figure out her own life.  Life was centered around H and kids.  Denies suicidal thoughts  Made following med changes: Increase sertraline back to 300 mg daily.  She's aware it's higher than the expected usual dose. Rexulti 2 mg daily Topiramate increase to target 50 mg BID and possibly higher for obesity.  She didn't increase sertraline bc fear of weight gain.  Quite a bit different.  No crying.   Sad a few times.  Better in 5 days.  Feels much less depressed.  Better energy, interest, enjoyment.  Also weather helping and started planting.  More motivation.  Desperate to lose weight.  Has some chronic pain and taking gabapentin.   Started gym cycle and lost some weight.    Worried over D with depression and conflict with pt.  D was always closer to father than her.  D distanced from her and recent bad outburst with her.  D recently blocked communication with her.   Disc getting her help.  Past Psychiatric Medication Trials:  Zoloft 300, Lexapro,, paroxetine Paliperidone, olanzapine, aripiprazole 20 mg, buspirone, clonidine, topiramate, gabapentin, metformin, phentermine, trazodone, Ambien  Ativan,  Remote history topiramate for migraine 1998, Belviq helped lose 85#  Review of Systems:  Review of Systems  Constitutional: Negative for unexpected weight change.       Sweating  Genitourinary: Negative for pelvic pain.  Musculoskeletal: Positive for myalgias.  Neurological: Negative for tremors and weakness.  Psychiatric/Behavioral: Positive for depression. Negative for agitation, behavioral problems, confusion, decreased concentration, dysphoric mood, hallucinations, self-injury, sleep disturbance and suicidal ideas. The patient is not nervous/anxious and is not hyperactive.     Medications: I have reviewed the patient's current medications.  Current Outpatient Medications  Medication Sig Dispense Refill  . albuterol (PROAIR HFA) 108 (90 BASE) MCG/ACT inhaler  2 puffs every 4 hours as needed only  if your can't catch your breath 1 Inhaler 1  . amLODipine (NORVASC) 5 MG tablet TAKE 1 TABLET BY MOUTH ONCE DAILY 90 tablet 1  . B Complex Vitamins (B-COMPLEX/B-12) LIQD Place 5 mLs under the tongue daily at 2 PM daily at 2 PM.    . brexpiprazole (REXULTI) 2 MG TABS tablet Take 1 tablet (2 mg total) by mouth daily. 30 tablet 0  . celecoxib (CELEBREX) 200 MG capsule Take 200 mg by mouth daily  as needed.   2  . Cholecalciferol (VITAMIN D) 125 MCG (5000 UT) CAPS Take 5,000 Units by mouth daily.     Marland Kitchen estradiol (ESTRACE) 0.5 MG tablet Take 1 tablet (0.5 mg total) by mouth daily. 30 tablet 3  . fexofenadine (ALLEGRA) 180 MG tablet Take 180 mg by mouth daily.    Marland Kitchen gabapentin (NEURONTIN) 800 MG tablet Take 800 mg by mouth 4 (four) times daily as needed.     . hydrochlorothiazide (HYDRODIURIL) 12.5 MG tablet TAKE 1 TABLET BY MOUTH ONCE DAILY 90 tablet 0  . LORazepam (ATIVAN) 1 MG tablet Take 1 tablet (1 mg total) by mouth daily as needed. 30 tablet 0  . mometasone (NASONEX) 50 MCG/ACT nasal spray Place 2 sprays into the nose daily. 17 g 3  . montelukast (SINGULAIR) 10 MG tablet TAKE 1 TABLET BY MOUTH ONCE DAILY 90 tablet 0  . nebivolol (BYSTOLIC) 10 MG tablet Take 1 tablet (10 mg total) by mouth 2 (two) times daily. 180 tablet 0  . NP THYROID 120 MG tablet TAKE 1 TABLET BY MOUTH ONCE DAILY 30 tablet 2  . pantoprazole (PROTONIX) 40 MG tablet TAKE 1 TABLET BY MOUTH TWICE A DAY 60 tablet 2  . progesterone (PROMETRIUM) 200 MG capsule Take 2 capsules (400 mg total) by mouth every evening. (Patient taking differently: Take 200 mg by mouth every evening. ) 60 capsule 3  . sertraline (ZOLOFT) 100 MG tablet Take 3 tablets (300 mg total) by mouth daily. (Patient taking differently: Take 200 mg by mouth daily. ) 270 tablet 0  . topiramate (TOPAMAX) 25 MG tablet Take 2 tablets (50 mg total) by mouth 2 (two) times daily. 120 tablet 1  . Travoprost, BAK Free, (TRAVATAN) 0.004 % SOLN ophthalmic solution Place 1 drop into both eyes at bedtime.    Marland Kitchen zolpidem (AMBIEN) 10 MG tablet TAKE 1/2-1 TABLET BY MOUTH AT BEDTIME AS NEEDED 30 tablet 5   No current facility-administered medications for this visit.    Medication Side Effects: Other: ? sweating related.  Allergies:  Allergies  Allergen Reactions  . Levaquin [Levofloxacin] Other (See Comments)    MUSCLE PAIN  . Nickel Rash    Past Medical History:   Diagnosis Date  . Allergy   . Anxiety   . Bronchiectasis   . Bronchitis    chronic  . 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 COPD (chronic obstructive pulmonary disease) ([employer])   . Dental crown present    upper  . Depression   . Environmental allergies    receives allergy shots  . GERD (gastroesophageal reflux disease)   . 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 - no current med.  . Glucose intolerance (impaired glucose tolerance)    on Metformin  . H/O hiatal hernia    "sliding"  . History of echocardiogram    Echo 1/16: EF 60-65, no RWMA, Gr 1 DD, mild LAE, trivial pericardial eff  post to heart  . History of echocardiogram    Echo 11/17: EF 55-60, no RWMA, Gr 1 DD, normal RV function.  Marland Kitchen History of palpitations    Holter 12/15: NSR, PACs  . Hx of migraines   . Hypothyroidism   . Osteoarthritis    knees  . Positional headache since 1997   if lies on left side    Family History  Problem Relation Age of Onset  . Lung cancer Father 6       dies age 46  . Emphysema Father   . COPD Brother 59       died age 75  . Stroke Mother   . Hypertension Sister   . Thyroid disease Sister        Graves Disease    Social History   Socioeconomic History  . Marital status: Widowed    Spouse name: Not on file  . Number of children: Not on file  . Years of education: Not on file  . Highest education level: Not on file  Occupational History  . Occupation: Land    Employer: [employer]  Tobacco Use  . Smoking status: Never Smoker  . Smokeless tobacco: Never Used  Substance and Sexual Activity  . Alcohol use: Yes    Alcohol/week: 4.0 standard drinks    Types: 4 Glasses of wine per week  . Drug use: No  . Sexual activity: Yes    Partners: Male    Birth control/protection: Post-menopausal  Other Topics Concern  . Not on file  Social History Narrative   Widowed late 2019 after 40+ years of marriage   3 sons one daughter   Working as a Corporate treasurer for [employer]   No/never tobacco no drug use 1 caffeinated beverage daily, occasional or rare glass of wine   Social Determinants of Radio broadcast assistant Strain:   . Difficulty of Paying Living Expenses:   Food Insecurity:   . Worried About [name] in the Last Year:   . Arboriculturist in the Last Year:   Transportation Needs:   . Film/video editor (Medical):   Marland Kitchen Lack of Transportation (Non-Medical):   Physical Activity:   . Days of Exercise per Week:   . Minutes of Exercise per Session:   Stress:   . Feeling of Stress :   Social Connections:   . Frequency of Communication with Friends and Family:   . Frequency of Social Gatherings with Friends and Family:   . Attends Religious Services:   . Active Member of Clubs or Organizations:   . Attends Archivist Meetings:   Marland Kitchen Marital Status:   Intimate Partner Violence:   . Fear of Current or Ex-Partner:   . Emotionally Abused:   Marland Kitchen Physically Abused:   . Sexually Abused:     Past Medical History, Surgical history, Social history, and Family history were reviewed and updated as appropriate.   Please see review of systems for further details on the patient's review from today.   Objective:   Physical Exam:  LMP 12/12/2005   Physical Exam Constitutional:      General: She is not in acute distress.    Appearance: She is well-developed. She is obese.  Musculoskeletal:        General: No deformity.  Neurological:     Mental Status: She is alert and oriented to person, place, and time.     Cranial  Nerves: No dysarthria.     Coordination: Coordination normal.  Psychiatric:        Attention and Perception: Perception normal. She is attentive. She does not perceive auditory or visual hallucinations.        Mood and Affect: Mood is anxious. Mood is not depressed. Affect is not labile, blunt, angry, tearful or inappropriate.        Speech: Speech normal.        Behavior: Behavior  normal. Behavior is cooperative.        Thought Content: Thought content normal. Thought content is not paranoid or delusional. Thought content does not include homicidal or suicidal ideation. Thought content does not include homicidal or suicidal plan.        Cognition and Memory: Cognition and memory normal.     Comments:  Fair insight and judgment   Gained 100# while H sick.  Lab Review:     Component Value Date/Time   NA 140 11/19/2019 1706   K 4.7 11/19/2019 1706   CL 103 11/19/2019 1706   CO2 26 11/19/2019 1706   GLUCOSE 98 11/19/2019 1706   BUN 19 11/19/2019 1706   CREATININE 1.09 (H) 11/19/2019 1706   CALCIUM 9.6 11/19/2019 1706   PROT 6.9 11/19/2019 1706   ALBUMIN 4.4 02/05/2015 0850   AST 19 11/19/2019 1706   ALT 26 11/19/2019 1706   ALKPHOS 53 02/05/2015 0850   BILITOT 0.3 11/19/2019 1706   GFRNONAA 54 (L) 11/19/2019 1706   GFRAA 62 11/19/2019 1706       Component Value Date/Time   WBC 10.2 03/23/2018 1244   RBC 4.32 03/23/2018 1244   HGB 13.5 03/23/2018 1244   HCT 40.0 03/23/2018 1244   PLT 406.0 (H) 03/23/2018 1244   MCV 92.6 03/23/2018 1244   MCH 31.6 01/31/2018 1813   MCHC 33.9 03/23/2018 1244   RDW 13.9 03/23/2018 1244   LYMPHSABS 2.5 03/23/2018 1244   MONOABS 0.6 03/23/2018 1244   EOSABS 0.1 03/23/2018 1244   BASOSABS 0.1 03/23/2018 1244    No results found for: POCLITH, LITHIUM   No results found for: PHENYTOIN, PHENOBARB, VALPROATE, CBMZ   .res Assessment: Plan:    Sage was seen today for depression, anxiety, eating disorder and follow-up.  Diagnoses and all orders for this visit:  Severe episode of recurrent major depressive disorder, without psychotic features ([place])  Complicated grief  Panic disorder with agoraphobia  Generalized anxiety disorder  Insomnia due to mental condition     Supportive therapy dealing with D Mary Tate's mental health problems.  Answered questions about trying to get her help.  Greater than 50% of  30 minface to face time with patient was spent on counseling and coordination of care. We discussed Disc the difference between depression and grief.  Her H would not talk about his cancer and wouldn't prepare her for his death. Supportive and grief work.  Rec therapeutic letter writing.    She is still dealing with a lot of grief with waves of depression and anxiety and at times easily overwhelmed but functional. Consider switch to SNRI,.  Ok to stay at sertraline 200 mg daily bc benefit from [facility]. Rexulti 2 mg daily Discussed the possibility this could be contributing to weight gain.  Realistically the only alternative would be fluoxetine and it is not as good of an anxiety med.  Her anxiety has been severe in the past.  I would advise against changing or reducing the dose at this time because  she has recently had a little more depression and anxiety than baseline.  Her panic disorder is generally controlled but a little worse than last time.  She is not using the lorazepam excessively.  She is taking it appropriately.  The diazepam is prescribed vaginally by her gynecologist for chronic pelvic pain and not being used. We discussed the short-term risks associated with benzodiazepines including sedation and increased fall risk among others.  Discussed long-term side effect risk including dependence, potential withdrawal symptoms, and the potential eventual dose-related risk of dementia.  Disc the risk of Ambien amnesia.  Prefer avoid stimulant weight loss meds per PCP.  Naltrexone vs topiramate.  She tolerated topiramate for a while in the past for migraine.  Topiramate 50 mg BID and possibly higher. To help weight and anxiety off label. Naltrexone or metformin or Ozempic options.  Seeing pain psychologist every other week.  Referred for vaginal/rectal pain doctor..  Pain resolved after injection. Tried support group grief without much help.  This appt was 30 mins.  FU 8 weeks  Lynder Parents, MD, DFAPA  Please see After Visit Summary for patient specific instructions.    Future Appointments  Date Time Provider [place]  03/23/2020  5:00 PM [facility] 1 [facility] [place]  03/25/2020  3:45 PM Mcarthur Rossetti, MD [facility] None  03/31/2020  4:15 PM Cottle, Billey Co., MD [facility] None  05/07/2020  4:00 PM Deneise Lever, MD [facility] None    No orders of the defined types were placed in this encounter.     -------------------------------

## 2020-03-23 ENCOUNTER — Other Ambulatory Visit: Payer: Self-pay

## 2020-03-23 ENCOUNTER — Ambulatory Visit (HOSPITAL_COMMUNITY)
Admission: RE | Admit: 2020-03-23 | Discharge: 2020-03-23 | Disposition: A | Discharge status: Home / Self Care | Payer: 59 | Source: Ambulatory Visit | Attending: Physician Assistant | Admitting: Physician Assistant

## 2020-03-23 ENCOUNTER — Other Ambulatory Visit (HOSPITAL_COMMUNITY): Payer: 59

## 2020-03-23 DIAGNOSIS — R102 Pelvic and perineal pain: Secondary | ICD-10-CM | POA: Diagnosis not present

## 2020-03-23 DIAGNOSIS — M25552 Pain in left hip: Secondary | ICD-10-CM | POA: Insufficient documentation

## 2020-03-23 MED FILL — NP THYROID 120 MG TABLET: 120 | 30 days supply | Qty: 30 | Fill #1

## 2020-03-24 ENCOUNTER — Other Ambulatory Visit (HOSPITAL_COMMUNITY): Payer: Self-pay | Admitting: Obstetrics and Gynecology

## 2020-03-24 ENCOUNTER — Telehealth (INDEPENDENT_AMBULATORY_CARE_PROVIDER_SITE_OTHER): Payer: Self-pay

## 2020-03-24 DIAGNOSIS — R29898 Other symptoms and signs involving the musculoskeletal system: Secondary | ICD-10-CM | POA: Diagnosis not present

## 2020-03-24 MED FILL — traMADol HCL 50 MG TABS: 50 | 7 days supply | Qty: 30 | Fill #0

## 2020-03-24 MED FILL — IBUPROFEN 800 MG TAB: 800 | 20 days supply | Qty: 60 | Fill #0

## 2020-03-24 NOTE — Telephone Encounter (Signed)
Pt is calling she doesn't feel well, doesn't feel herself, wants nellie to call her

## 2020-03-25 ENCOUNTER — Other Ambulatory Visit: Payer: Self-pay

## 2020-03-25 ENCOUNTER — Encounter: Payer: Self-pay | Admitting: Orthopaedic Surgery

## 2020-03-25 ENCOUNTER — Ambulatory Visit: Payer: 59 | Admitting: Orthopaedic Surgery

## 2020-03-25 DIAGNOSIS — R102 Pelvic and perineal pain: Secondary | ICD-10-CM

## 2020-03-25 DIAGNOSIS — S76312D Strain of muscle, fascia and tendon of the posterior muscle group at thigh level, left thigh, subsequent encounter: Secondary | ICD-10-CM

## 2020-03-25 NOTE — Progress Notes (Signed)
The patient is a very pleasant 64 year old female who is seen in follow-up with severe pelvic pain.  We had recommended a MRI given the severity of her pain.  She does have a lot of pain in the left hip and left ischial area.  She also reports pain in her vagina as well as numbness and tingling in her pelvis.  She is someone who is significantly obese and has been dealing with this after a severe mechanical fall a few years ago.  He is also dealt with the stress of losing her husband.  This caused severe pain.  She does see her gynecologist who recently has prescribed her 100 by grams ibuprofen as well as some tramadol.  He does try to avoid taking medications.  Her pain is severe.  She is also taking medications for anxiety and depression and sees a psychiatrist for this.  All this has been quite overwhelming to her and she is incredibly tearful in the office today.  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  I have told her this definitely correlates with the pain she is having around the ischium.  Her hips appear normal on the MRI and there is no evidence of any arthrosis or arthritis of the hips.  There is no fluid collections around the trochanteric area on the left side.  I do feel the ibuprofen and tramadol are appropriate.  We can always refill the tramadol for her.  I feel that it is essential she get physical therapy to work on any modalities that can help with her gait and this hamstring pain.  She would benefit from potentially dry needling and e-stim as well as diaphoresis and even a TENS unit.  She is apparently had an injection in the ischial area before which helped quite a bit but then the second injection did not help.  She states that she sees the specialist in a few weeks and I have encouraged her to show that position her MRI findings so it  is worth trying another injection in this area.  Hopefully this combination things can help her symptoms.  We will see her back in 6 weeks to see how she is doing overall.

## 2020-03-27 DIAGNOSIS — M7060 Trochanteric bursitis, unspecified hip: Secondary | ICD-10-CM | POA: Diagnosis not present

## 2020-03-27 DIAGNOSIS — S76392D Other specified injury of muscle, fascia and tendon of the posterior muscle group at thigh level, left thigh, subsequent encounter: Secondary | ICD-10-CM | POA: Diagnosis not present

## 2020-03-31 ENCOUNTER — Other Ambulatory Visit: Payer: Self-pay | Admitting: Psychiatry

## 2020-03-31 ENCOUNTER — Ambulatory Visit: Payer: 59 | Admitting: Psychiatry

## 2020-03-31 MED FILL — REXULTI 2 MG TABLET: 2 | 30 days supply | Qty: 30 | Fill #0

## 2020-04-13 DIAGNOSIS — H401134 Primary open-angle glaucoma, bilateral, indeterminate stage: Secondary | ICD-10-CM | POA: Diagnosis not present

## 2020-04-14 ENCOUNTER — Other Ambulatory Visit: Payer: Self-pay | Admitting: Psychiatry

## 2020-04-14 ENCOUNTER — Other Ambulatory Visit (INDEPENDENT_AMBULATORY_CARE_PROVIDER_SITE_OTHER): Payer: Self-pay | Admitting: Internal Medicine

## 2020-04-14 DIAGNOSIS — F4001 Agoraphobia with panic disorder: Secondary | ICD-10-CM

## 2020-04-14 MED FILL — IBUPROFEN 800 MG TAB: 800 | 20 days supply | Qty: 60 | Fill #1

## 2020-04-14 MED FILL — MONTELUKAST SOD 10 MG TAB: 10 | 90 days supply | Qty: 90 | Fill #1

## 2020-04-14 MED FILL — PANTOPRAZOLE SOD DR 40 MG T: 40 | 30 days supply | Qty: 60 | Fill #1

## 2020-04-14 MED FILL — PROGESTERONE 200 MG CAPS: 200 | 30 days supply | Qty: 60 | Fill #3

## 2020-04-14 MED FILL — ESTRADIOL 0.5 MG TABS: 0.5 | 30 days supply | Qty: 30 | Fill #3

## 2020-04-14 MED FILL — TRAVOPROST (BAK FREE) 0.004: 0.004 | 50 days supply | Qty: 5 | Fill #1

## 2020-04-14 MED FILL — TOPIRAMATE 25 MG TABLET: 25 | 30 days supply | Qty: 120 | Fill #1

## 2020-04-15 ENCOUNTER — Other Ambulatory Visit (HOSPITAL_COMMUNITY): Payer: Self-pay | Admitting: Ophthalmology

## 2020-04-15 MED FILL — LORazepam 1 MG TABS: 1 | 30 days supply | Qty: 30 | Fill #0

## 2020-04-15 MED FILL — GABAPENTIN 800 MG TABS: 800 | 30 days supply | Qty: 120 | Fill #0

## 2020-04-16 MED FILL — ZOLPIDEM TARTRATE 10 MG TAB: 10 | 30 days supply | Qty: 30 | Fill #2

## 2020-04-17 MED FILL — NP THYROID 120 MG TABLET: 120 | 30 days supply | Qty: 30 | Fill #2

## 2020-04-21 ENCOUNTER — Ambulatory Visit: Payer: 59 | Admitting: Psychiatry

## 2020-05-05 MED FILL — REXULTI 2 MG TABLET: 2 | 30 days supply | Qty: 30 | Fill #1

## 2020-05-07 ENCOUNTER — Ambulatory Visit: Payer: 59 | Admitting: Internal Medicine

## 2020-05-18 ENCOUNTER — Other Ambulatory Visit (INDEPENDENT_AMBULATORY_CARE_PROVIDER_SITE_OTHER): Payer: Self-pay | Admitting: Internal Medicine

## 2020-05-19 ENCOUNTER — Ambulatory Visit (INDEPENDENT_AMBULATORY_CARE_PROVIDER_SITE_OTHER): Payer: 59 | Admitting: Psychiatry

## 2020-05-19 ENCOUNTER — Encounter: Payer: Self-pay | Admitting: Psychiatry

## 2020-05-19 ENCOUNTER — Other Ambulatory Visit: Payer: Self-pay

## 2020-05-19 DIAGNOSIS — F4001 Agoraphobia with panic disorder: Secondary | ICD-10-CM | POA: Diagnosis not present

## 2020-05-19 DIAGNOSIS — F332 Major depressive disorder, recurrent severe without psychotic features: Secondary | ICD-10-CM | POA: Diagnosis not present

## 2020-05-19 DIAGNOSIS — F4329 Adjustment disorder with other symptoms: Secondary | ICD-10-CM | POA: Diagnosis not present

## 2020-05-19 DIAGNOSIS — F5105 Insomnia due to other mental disorder: Secondary | ICD-10-CM

## 2020-05-19 DIAGNOSIS — F411 Generalized anxiety disorder: Secondary | ICD-10-CM

## 2020-05-19 DIAGNOSIS — F4321 Adjustment disorder with depressed mood: Secondary | ICD-10-CM

## 2020-05-19 MED ORDER — TOPIRAMATE 25 MG PO TABS: 75.0000 mg | ORAL_TABLET | Freq: Two times a day (BID) | ORAL | 1 refills | Status: DC

## 2020-05-19 MED ORDER — LORAZEPAM 1 MG PO TABS: 1.0000 mg | ORAL_TABLET | Freq: Every day | ORAL | 1 refills | Status: DC | PRN

## 2020-05-19 NOTE — Progress Notes (Signed)
[name] [name] [id number] Feb 04, 1956 64 y.o.  Subjective:   Patient ID:  [name] [name] is a 64 y.o. (DOB 1956/08/30) female.  Chief Complaint:  Chief Complaint  Patient presents with  . Follow-up  . Depression  . Anxiety    Depression        Associated symptoms include myalgias.  Associated symptoms include no decreased concentration and no suicidal ideas. Medication Refill Associated symptoms include myalgias. Pertinent negatives include no chest pain or weakness.   [name] [name] presents to the office today for follow-up of anxiety.  seen April 2020.  She was dealing with grief and depression and sertraline was increased back to 200 mg daily.  [name] [name] died in 30-Oct-2018  from cancer.  Married 41 years.  Is able to focus on positive memories.  Not ready to move.  seen August 2020.  No meds were changed.  She continued sertraline 200 mg daily.  seen January 2020.  Sertraline was unchanged.  She was having still grief issues related to the loss of her husband.  She was also having problems with weight attributed to the sertraline.  She was given a trial of topiramate to try to help offset some of the weight gain plus it can have some potential antianxiety effects.  seen 02/19/20 stating: Very depressed. So sad.  Don't want to get OOB.  Got lost coming here and late.  Worse for 3-4 weeks.  Days off are terrible.  Barely able to function at work.  Cry all the way home after work.  So lonely.  Kids aren't coming like they were bc they are busy.  Need to figure out her own life.  Life was centered around H and kids.  Denies suicidal thoughts  Made following med changes: Increase sertraline back to 300 mg daily.  She's aware it's higher than the expected usual dose. Rexulti 2 mg daily Topiramate increase to target 50 mg BID and possibly higher for obesity.  03/18/2020 appointment the following is noted: No meds were changed. She didn't increase sertraline bc fear of weight  gain.  Quite a bit different.  No crying.  Sad a few times.  Better in 5 days.  Feels much less depressed.  Better energy, interest, enjoyment.  Also weather helping and started planting.  More motivation.  Desperate to lose weight.  Has some chronic pain and taking gabapentin.   Started gym cycle and lost some weight.   Worried over D with depression and conflict with pt.  D was always closer to father than her.  D distanced from her and recent bad outburst with her.  D recently blocked communication with her.   Disc getting her help.  05/19/2020 appointment with the following noted: Tried to self medicate bc felt good on Rexulti and tried reducing sertraline to 150 for 2 weeks and got worse.  So increased back to 100 mg BID. Stopped cable and got too bored for 3 mos.  Restarted cable yesterday.   Still grieving.  Kids want her to sell the house but she can't. Anxiety in the morning fidgety.  Taken more Ativan lately with more panic and worry over the future.  Almost like too much caffeine before any.  Limits it. Better if busy. Does better with working.  Was too sleepy with progesterone. Successful losing weight on the topiramate.  Past Psychiatric Medication Trials:  Zoloft 300, Lexapro,, paroxetine Paliperidone, olanzapine, aripiprazole 20 mg, buspirone, clonidine, topiramate, gabapentin, metformin, phentermine, trazodone, Ambien  Ativan,  Remote history topiramate for migraine 1998, Belviq helped lose 85#  Review of Systems:  Review of Systems  Constitutional: Negative for unexpected weight change.       Sweating  Cardiovascular: Negative for chest pain.  Gastrointestinal: Positive for rectal pain.  Genitourinary: Negative for pelvic pain.  Musculoskeletal: Positive for myalgias.  Neurological: Negative for tremors and weakness.  Psychiatric/Behavioral: Positive for depression. Negative for agitation, behavioral problems, confusion, decreased concentration, dysphoric mood,  hallucinations, self-injury, sleep disturbance and suicidal ideas. The patient is not nervous/anxious and is not hyperactive.     Medications: I have reviewed the patient's current medications.  Current Outpatient Medications  Medication Sig Dispense Refill  . albuterol (PROAIR HFA) 108 (90 BASE) MCG/ACT inhaler 2 puffs every 4 hours as needed only  if your can't catch your breath 1 Inhaler 1  . amLODipine (NORVASC) 5 MG tablet TAKE 1 TABLET BY MOUTH ONCE DAILY 90 tablet 1  . B Complex Vitamins (B-COMPLEX/B-12) LIQD Place 5 mLs under the tongue daily at 2 PM daily at 2 PM.    . celecoxib (CELEBREX) 200 MG capsule Take 200 mg by mouth daily as needed.   2  . Cholecalciferol (VITAMIN D) 125 MCG (5000 UT) CAPS Take 5,000 Units by mouth daily.     Marland Kitchen estradiol (ESTRACE) 0.5 MG tablet Take 1 tablet (0.5 mg total) by mouth daily. 30 tablet 3  . fexofenadine (ALLEGRA) 180 MG tablet Take 180 mg by mouth daily.    Marland Kitchen gabapentin (NEURONTIN) 800 MG tablet TAKE 1 TABLET BY MOUTH FOUR TIMES DAILY 120 tablet 3  . hydrochlorothiazide (MICROZIDE) 12.5 MG capsule TAKE 1 CAPSULE BY MOUTH ONCE DAILY 90 capsule 0  . mometasone (NASONEX) 50 MCG/ACT nasal spray Place 2 sprays into the nose daily. 17 g 3  . montelukast (SINGULAIR) 10 MG tablet TAKE 1 TABLET BY MOUTH ONCE DAILY 90 tablet 0  . nebivolol (BYSTOLIC) 10 MG tablet Take 1 tablet (10 mg total) by mouth 2 (two) times daily. 180 tablet 0  . NP THYROID 120 MG tablet TAKE 1 TABLET BY MOUTH ONCE DAILY 30 tablet 2  . pantoprazole (PROTONIX) 40 MG tablet TAKE 1 TABLET BY MOUTH TWICE A DAY 60 tablet 2  . progesterone (PROMETRIUM) 200 MG capsule Take 2 capsules (400 mg total) by mouth every evening. (Patient taking differently: Take 200 mg by mouth every evening. ) 60 capsule 3  . REXULTI 2 MG TABS tablet TAKE 1 TABLET BY MOUTH ONCE DAILY 30 tablet 5  . sertraline (ZOLOFT) 100 MG tablet Take 3 tablets (300 mg total) by mouth daily. (Patient taking differently: Take  200 mg by mouth daily. ) 270 tablet 0  . topiramate (TOPAMAX) 25 MG tablet Take 3 tablets (75 mg total) by mouth 2 (two) times daily. 180 tablet 1  . Travoprost, BAK Free, (TRAVATAN) 0.004 % SOLN ophthalmic solution Place 1 drop into both eyes at bedtime.    Marland Kitchen zolpidem (AMBIEN) 10 MG tablet TAKE 1/2-1 TABLET BY MOUTH AT BEDTIME AS NEEDED 30 tablet 5  . LORazepam (ATIVAN) 1 MG tablet Take 1 tablet (1 mg total) by mouth daily as needed. 30 tablet 1   No current facility-administered medications for this visit.    Medication Side Effects: Other: ? sweating related.  Allergies:  Allergies  Allergen Reactions  . Levaquin [Levofloxacin] Other (See Comments)    MUSCLE PAIN  . Nickel Rash    Past Medical History:  Diagnosis Date  . Allergy   .  Anxiety   . Bronchiectasis   . Bronchitis    chronic  . 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 COPD (chronic obstructive pulmonary disease) ([place])   . Dental crown present    upper  . Depression   . Environmental allergies    receives allergy shots  . GERD (gastroesophageal reflux disease)   . 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 - no current med.  . Glucose intolerance (impaired glucose tolerance)    on Metformin  . H/O hiatal hernia    "sliding"  . History of echocardiogram    Echo 1/16: EF 60-65, no RWMA, Gr 1 DD, mild LAE, trivial pericardial eff post to heart  . History of echocardiogram    Echo 11/17: EF 55-60, no RWMA, Gr 1 DD, normal RV function.  Marland Kitchen History of palpitations    Holter 12/15: NSR, PACs  . Hx of migraines   . Hypothyroidism   . Osteoarthritis    knees  . Positional headache since 1997   if lies on left side    Family History  Problem Relation Age of Onset  . Lung cancer Father 62       dies age 22  . Emphysema Father   . COPD Brother 29       died age 28  . Stroke Mother   . Hypertension Sister   . Thyroid disease Sister        Graves Disease    Social History   Socioeconomic History   . Marital status: Widowed    Spouse name: Not on file  . Number of children: Not on file  . Years of education: Not on file  . Highest education level: Not on file  Occupational History  . Occupation: Land    Employer: [employer]  Tobacco Use  . Smoking status: Never Smoker  . Smokeless tobacco: Never Used  Substance and Sexual Activity  . Alcohol use: Yes    Alcohol/week: 4.0 standard drinks    Types: 4 Glasses of wine per week  . Drug use: No  . Sexual activity: Yes    Partners: Male    Birth control/protection: Post-menopausal  Other Topics Concern  . Not on file  Social History Narrative   Widowed late 2019 after 40+ years of marriage   3 sons one daughter   Working as a Company secretary for [employer]   No/never tobacco no drug use 1 caffeinated beverage daily, occasional or rare glass of wine   Social Determinants of Radio broadcast assistant Strain:   . Difficulty of Paying Living Expenses:   Food Insecurity:   . Worried About [name] in the Last Year:   . Arboriculturist in the Last Year:   Transportation Needs:   . Film/video editor (Medical):   Marland Kitchen Lack of Transportation (Non-Medical):   Physical Activity:   . Days of Exercise per Week:   . Minutes of Exercise per Session:   Stress:   . Feeling of Stress :   Social Connections:   . Frequency of Communication with Friends and Family:   . Frequency of Social Gatherings with Friends and Family:   . Attends Religious Services:   . Active Member of Clubs or Organizations:   . Attends Archivist Meetings:   Marland Kitchen Marital Status:   Intimate Partner Violence:   . Fear of Current or Ex-Partner:   . Emotionally  Abused:   Marland Kitchen Physically Abused:   . Sexually Abused:     Past Medical History, Surgical history, Social history, and Family history were reviewed and updated as appropriate.   Please see review of systems for further details on the patient's review from today.    Objective:   Physical Exam:  LMP 12/12/2005   Physical Exam Constitutional:      General: She is not in acute distress.    Appearance: She is well-developed. She is obese.  Musculoskeletal:        General: No deformity.  Neurological:     Mental Status: She is alert and oriented to person, place, and time.     Cranial Nerves: No dysarthria.     Coordination: Coordination normal.  Psychiatric:        Attention and Perception: Perception normal. She is attentive. She does not perceive auditory or visual hallucinations.        Mood and Affect: Mood is anxious. Mood is not depressed. Affect is not labile, blunt, angry, tearful or inappropriate.        Speech: Speech normal. Speech is not slurred.        Behavior: Behavior normal. Behavior is not slowed. Behavior is cooperative.        Thought Content: Thought content normal. Thought content is not paranoid or delusional. Thought content does not include homicidal or suicidal ideation. Thought content does not include homicidal or suicidal plan.        Cognition and Memory: Cognition and memory normal.     Comments: Affect variable. Fair insight and judgment   Gained 100# while H sick.  Lab Review:     Component Value Date/Time   NA 140 11/19/2019 1706   K 4.7 11/19/2019 1706   CL 103 11/19/2019 1706   CO2 26 11/19/2019 1706   GLUCOSE 98 11/19/2019 1706   BUN 19 11/19/2019 1706   CREATININE 1.09 (H) 11/19/2019 1706   CALCIUM 9.6 11/19/2019 1706   PROT 6.9 11/19/2019 1706   ALBUMIN 4.4 02/05/2015 0850   AST 19 11/19/2019 1706   ALT 26 11/19/2019 1706   ALKPHOS 53 02/05/2015 0850   BILITOT 0.3 11/19/2019 1706   GFRNONAA 54 (L) 11/19/2019 1706   GFRAA 62 11/19/2019 1706       Component Value Date/Time   WBC 10.2 03/23/2018 1244   RBC 4.32 03/23/2018 1244   HGB 13.5 03/23/2018 1244   HCT 40.0 03/23/2018 1244   PLT 406.0 (H) 03/23/2018 1244   MCV 92.6 03/23/2018 1244   MCH 31.6 01/31/2018 1813   MCHC 33.9 03/23/2018  1244   RDW 13.9 03/23/2018 1244   LYMPHSABS 2.5 03/23/2018 1244   MONOABS 0.6 03/23/2018 1244   EOSABS 0.1 03/23/2018 1244   BASOSABS 0.1 03/23/2018 1244    No results found for: POCLITH, LITHIUM   No results found for: PHENYTOIN, PHENOBARB, VALPROATE, CBMZ   .res Assessment: Plan:    [name] [name] was seen today for follow-up, depression and anxiety.  Diagnoses and all orders for this visit:  Severe episode of recurrent major depressive disorder, without psychotic features ([place])  Complicated grief  Panic disorder with agoraphobia -     topiramate (TOPAMAX) 25 MG tablet; Take 3 tablets (75 mg total) by mouth 2 (two) times daily. -     LORazepam (ATIVAN) 1 MG tablet; Take 1 tablet (1 mg total) by mouth daily as needed.  Generalized anxiety disorder -     topiramate (TOPAMAX) 25 MG tablet; Take  3 tablets (75 mg total) by mouth 2 (two) times daily.  Insomnia due to mental condition     Supportive therapy dealing with D Mary Tate's mental health problems.  Answered questions about trying to get her help.  Greater than 50% of 30 minface to face time with patient was spent on counseling and coordination of care. We discussed Disc the difference between depression and grief.  Her H would not talk about his cancer and wouldn't prepare her for his death. Supportive and grief work.  Rec therapeutic letter writing as she is still having a problem. Her panic disorder is generally controlled but more anxious somewhat.. She is still dealing with a lot of grief with waves of depression and anxiety and at times easily overwhelmed but functional.  Disc importance of staying busy to help with depression and anxiety  Consider switch to SNRI,.  Ok to stay at sertraline 200 mg daily bc benefit from [place].  Option increase Rexulti as trial for anxiety.  Option trial increase topiramate off label for anxiety also for weight. Yes trial Rexulti 3 mg daily for 2 weeks to see if can gain more benefit for  depression and anxiety. If no benefit then reduce Rexulti back to 2 mg.  She'll call  Discussed the possibility this could be contributing to weight gain.  Realistically the only alternative would be fluoxetine and it is not as good of an anxiety med.  Her anxiety has been severe in the past.  I would advise against changing or reducing the dose at this time because she has recently had a little more depression and anxiety than baseline.  She is not using the lorazepam excessively.  She is taking it appropriately.  The diazepam is prescribed vaginally by her gynecologist for chronic pelvic pain and not being used. We discussed the short-term risks associated with benzodiazepines including sedation and increased fall risk among others.  Discussed long-term side effect risk including dependence, potential withdrawal symptoms, and the potential eventual dose-related risk of dementia.  Disc the risk of Ambien amnesia.  Prefer avoid stimulant weight loss meds per PCP.  Naltrexone vs topiramate.  She tolerated topiramate for a while in the past for migraine.  Topiramate She would like to increase further; OK increase to 75 mg BID.   To help weight and anxiety off label. She has been successful losing weight on this.  It also is used for HA.   Naltrexone or metformin or Ozempic are other options.  Pain is better but not gone and in therapy.  This appt was 30 mins.  FU 8 weeks  Lynder Parents, MD, DFAPA  Please see After Visit Summary for patient specific instructions.    Future Appointments  Date Time Provider [place]  05/25/2020  3:45 PM Doree Albee, MD [facility] None  08/06/2020  4:00 PM Baird Lyons D, MD [facility] None    No orders of the defined types were placed in this encounter.     -------------------------------

## 2020-05-25 ENCOUNTER — Ambulatory Visit (INDEPENDENT_AMBULATORY_CARE_PROVIDER_SITE_OTHER): Payer: 59 | Admitting: Internal Medicine

## 2020-05-25 ENCOUNTER — Other Ambulatory Visit (INDEPENDENT_AMBULATORY_CARE_PROVIDER_SITE_OTHER): Payer: Self-pay

## 2020-05-25 ENCOUNTER — Other Ambulatory Visit (INDEPENDENT_AMBULATORY_CARE_PROVIDER_SITE_OTHER): Payer: Self-pay | Admitting: Internal Medicine

## 2020-05-25 MED ORDER — MOMETASONE FUROATE 50 MCG/ACT NA SUSP: 2.0000 | Freq: Every day | NASAL | 3 refills | Status: DC

## 2020-05-25 MED FILL — MOMETASONE FUROATE 50 MCG S: 50 | 30 days supply | Qty: 17 | Fill #0

## 2020-06-01 ENCOUNTER — Other Ambulatory Visit: Payer: Self-pay

## 2020-06-01 ENCOUNTER — Telehealth: Payer: Self-pay | Admitting: Psychiatry

## 2020-06-01 MED ORDER — BREXPIPRAZOLE 3 MG PO TABS: 3.0000 mg | ORAL_TABLET | Freq: Every day | ORAL | 5 refills | Status: DC

## 2020-06-01 MED FILL — REXULTI 3 MG TABLET: 3 | 30 days supply | Qty: 30 | Fill #0

## 2020-06-01 NOTE — Telephone Encounter (Signed)
[name] [name] called reporting that she is only has 1 dose left of the Rexulti.  She has been taking a total of 3mg  from samples of 2mg  and 1mg .  It seems to be helping.  Please call in a prescription of [place]. Pharmacy.

## 2020-06-01 NOTE — Telephone Encounter (Signed)
Rx sent but may have to come get samples depending on how quick they can get her Rx

## 2020-06-02 MED FILL — IBUPROFEN 800 MG TAB: 800 | 20 days supply | Qty: 60 | Fill #2

## 2020-06-02 MED FILL — PANTOPRAZOLE SOD DR 40 MG T: 40 | 30 days supply | Qty: 60 | Fill #2

## 2020-06-03 ENCOUNTER — Telehealth: Payer: Self-pay | Admitting: Psychiatry

## 2020-06-03 NOTE — Telephone Encounter (Signed)
Pt called to report since the increase in Rexulti from 2mg  to 3 mg can not stay awake. Can't drive with out nearly falling a sleep. Out of town and needs to talk with nurse to see what CC feels needs to be changed. [phone]

## 2020-06-03 NOTE — Telephone Encounter (Signed)
Hold Rexulti until she gets back from her trip.  Let us know when the sleepiness is resolved and we'll consider whether to use it at a lower dose.

## 2020-06-03 NOTE — Telephone Encounter (Signed)
Patient aware and agreed. Will call back with update.

## 2020-06-11 ENCOUNTER — Telehealth: Payer: Self-pay | Admitting: Psychiatry

## 2020-06-11 ENCOUNTER — Other Ambulatory Visit: Payer: Self-pay

## 2020-06-11 MED ORDER — BREXPIPRAZOLE 1 MG PO TABS: 1.0000 mg | ORAL_TABLET | Freq: Every day | ORAL | 5 refills | Status: DC

## 2020-06-11 MED FILL — REXULTI 1 MG TABLET: 1 | 30 days supply | Qty: 30 | Fill #0

## 2020-06-11 NOTE — Telephone Encounter (Signed)
OK Rexulti 1 mg daily.  Please send in RX. If she has leftover 2 mg tablets, she could take 1 every other day

## 2020-06-11 NOTE — Telephone Encounter (Signed)
Patient aware and will use what she has. I also sent a Rx for Rexulti 1 mg to [facility] per her request. Advised to call back if symptoms not improving or worsening.

## 2020-06-11 NOTE — Telephone Encounter (Signed)
Patient called and said that she stopped rexulti per dr. Clovis Pu request. However she is sad, crying all the time and vedry depressed. She would like to know if she can go back on the rexulkti and take 1 mg insterad of the 3 mg. Please give her a phone call at [phone]

## 2020-06-17 MED FILL — ZOLPIDEM TARTRATE 10 MG TAB: 10 | 30 days supply | Qty: 30 | Fill #4

## 2020-06-17 MED FILL — AMLODIPINE BESYLATE 5 MG TA: 5 | 90 days supply | Qty: 90 | Fill #1

## 2020-06-19 MED FILL — NP THYROID 60 MG TABLET: 60 | 30 days supply | Qty: 60 | Fill #0

## 2020-07-02 ENCOUNTER — Other Ambulatory Visit (INDEPENDENT_AMBULATORY_CARE_PROVIDER_SITE_OTHER): Payer: Self-pay | Admitting: Internal Medicine

## 2020-07-02 ENCOUNTER — Other Ambulatory Visit (INDEPENDENT_AMBULATORY_CARE_PROVIDER_SITE_OTHER): Payer: Self-pay

## 2020-07-02 ENCOUNTER — Other Ambulatory Visit: Payer: Self-pay

## 2020-07-02 ENCOUNTER — Telehealth: Payer: Self-pay | Admitting: Psychiatry

## 2020-07-02 MED ORDER — PANTOPRAZOLE SODIUM 40 MG PO TBEC: 40.0000 mg | DELAYED_RELEASE_TABLET | Freq: Two times a day (BID) | ORAL | 2 refills | Status: DC

## 2020-07-02 MED ORDER — BREXPIPRAZOLE 2 MG PO TABS: 2.0000 mg | ORAL_TABLET | Freq: Every day | ORAL | 1 refills | Status: DC

## 2020-07-02 MED FILL — PANTOPRAZOLE SOD DR 40 MG T: 40 | 30 days supply | Qty: 60 | Fill #0

## 2020-07-02 MED FILL — BYSTOLIC 10 MG TABLET: 10 | 30 days supply | Qty: 60 | Fill #2

## 2020-07-02 NOTE — Telephone Encounter (Signed)
[name] [name] called to request refill of her Rexulti at 2mg . She has been taking 2mg  for about 3 weeks and it has worked really well.  She has also stopped drinking a glass of wine in the evening and that has helped too.  She thinks it was interfering with the anti-depressant.  Please send the prescription to [place]. Pharmacy

## 2020-07-02 NOTE — Telephone Encounter (Signed)
Submitted an updated Rx for Rexulti 2 mg to [facility] outpatient. She increased it back up from the 1 mg again. Has upcoming apt also.

## 2020-07-03 MED FILL — REXULTI 2 MG TABLET: 2 | 30 days supply | Qty: 30 | Fill #0

## 2020-07-14 MED FILL — LORazepam 1 MG TABS: 1 | 30 days supply | Qty: 30 | Fill #1

## 2020-07-16 ENCOUNTER — Telehealth: Payer: Self-pay | Admitting: Psychiatry

## 2020-07-16 NOTE — Telephone Encounter (Signed)
[name] [name] called because she is not doing well.  Very lonely.  Doesn't even want to go home to the empty house.  Wants to know if going up to the 3mg  of the Rexulti again would help. She has some she can take.  She doesn't think it was the Rexulti that was making her so tired before.  She sees you next Tuesday but needs to do something before then if she can. If not the Rexulti, perhaps you have another idea.  Please call.

## 2020-07-16 NOTE — Telephone Encounter (Signed)
I don't want to make another change over the phone.  Frequent dosage changes with Rexulti causes problems judging effectiveness and SE.  No change until seen next week

## 2020-07-17 MED FILL — ZOLPIDEM TARTRATE 10 MG TAB: 10 | 30 days supply | Qty: 30 | Fill #5

## 2020-07-17 NOTE — Telephone Encounter (Signed)
Rtc to patient and she already restarted Rexulti 3 mg, she said she's just so desperate and didn't think she could wait till Tuesday. Advised her of Dr. Casimiro Needle recommendation anyway. She said she did speak with her other doctor apt being so sleepy and the high dose of progesterone 400 mg she was taking was likely the reason. She has since stopped taking any of that. Discussed with her if she felt she needed to restart Rexulti before her apt she can see how she feels by Tuesday then can be addressed at apt. She was appreciative and reassuring after talking things through.

## 2020-07-20 MED FILL — NP THYROID 60 MG TABLET: 60 | 30 days supply | Qty: 60 | Fill #1

## 2020-07-21 ENCOUNTER — Other Ambulatory Visit: Payer: Self-pay

## 2020-07-21 ENCOUNTER — Ambulatory Visit (INDEPENDENT_AMBULATORY_CARE_PROVIDER_SITE_OTHER): Payer: 59 | Admitting: Psychiatry

## 2020-07-21 ENCOUNTER — Encounter: Payer: Self-pay | Admitting: Psychiatry

## 2020-07-21 DIAGNOSIS — F4001 Agoraphobia with panic disorder: Secondary | ICD-10-CM

## 2020-07-21 DIAGNOSIS — F332 Major depressive disorder, recurrent severe without psychotic features: Secondary | ICD-10-CM | POA: Diagnosis not present

## 2020-07-21 DIAGNOSIS — F4329 Adjustment disorder with other symptoms: Secondary | ICD-10-CM

## 2020-07-21 DIAGNOSIS — F4321 Adjustment disorder with depressed mood: Secondary | ICD-10-CM

## 2020-07-21 DIAGNOSIS — F411 Generalized anxiety disorder: Secondary | ICD-10-CM

## 2020-07-21 NOTE — Progress Notes (Signed)
[name] [name] [id number] [birth year] 64 y.o.  Subjective:   Patient ID:  [name] [name] is a 64 y.o. (DOB 1956-07-20) female.  Chief Complaint:  Chief Complaint  Patient presents with  . Follow-up  . Depression  . Anxiety  . Medication Problem    Depression        Associated symptoms include fatigue and myalgias.  Associated symptoms include no decreased concentration and no suicidal ideas. Medication Refill Associated symptoms include fatigue and myalgias. Pertinent negatives include no chest pain or weakness.   [name] [name] presents to the office today for follow-up of anxiety.  seen April 2020.  She was dealing with grief and depression and sertraline was increased back to 200 mg daily.  [name] died in 2018/10/22  from cancer.  Married 41 years.  Is able to focus on positive memories.  Not ready to move.  seen August 2020.  No meds were changed.  She continued sertraline 200 mg daily.  seen January 2020.  Sertraline was unchanged.  She was having still grief issues related to the loss of her husband.  She was also having problems with weight attributed to the sertraline.  She was given a trial of topiramate to try to help offset some of the weight gain plus it can have some potential antianxiety effects.  seen 02/19/20 stating: Very depressed. So sad.  Don't want to get OOB.  Got lost coming here and late.  Worse for 3-4 weeks.  Days off are terrible.  Barely able to function at work.  Cry all the way home after work.  So lonely.  Kids aren't coming like they were bc they are busy.  Need to figure out her own life.  Life was centered around H and kids.  Denies suicidal thoughts  Made following med changes: Increase sertraline back to 300 mg daily.  She's aware it's higher than the expected usual dose. Rexulti 2 mg daily Topiramate increase to target 50 mg BID and possibly higher for obesity.  03/18/2020 appointment the following is noted: No meds were changed. She  didn't increase sertraline bc fear of weight gain.  Quite a bit different.  No crying.  Sad a few times.  Better in 5 days.  Feels much less depressed.  Better energy, interest, enjoyment.  Also weather helping and started planting.  More motivation.  Desperate to lose weight.  Has some chronic pain and taking gabapentin.   Started gym cycle and lost some weight.   Worried over D with depression and conflict with pt.  D was always closer to father than her.  D distanced from her and recent bad outburst with her.  D recently blocked communication with her.   Disc getting her help.  05/19/2020 appointment with the following noted: Tried to self medicate bc felt good on Rexulti and tried reducing sertraline to 150 for 2 weeks and got worse.  So increased back to 100 mg BID. Stopped cable and got too bored for 3 mos.  Restarted cable yesterday.   Still grieving.  Kids want her to sell the house but she can't. Anxiety in the morning fidgety.  Taken more Ativan lately with more panic and worry over the future.  Almost like too much caffeine before any.  Limits it. Better if busy. Does better with working.  Was too sleepy with progesterone. Successful losing weight on the topiramate. Assessment/Plan: Her panic disorder is generally controlled but more anxious somewhat..She is still dealing with a  lot of grief with waves of depression and anxiety and at times easily overwhelmed but functional.   trial Rexulti 3 mg daily for 2 weeks to see if can gain more benefit for depression and anxiety. If no benefit then reduce Rexulti back to 2 mg.  She'll call  06/01/2020 phone call reporting that 3 mg Rexulti seem to be helping and she wanted to continue it. 06/03/2020, 2 days later she called back stating it was making her too sleepy.  She was planning a trip and she was encouraged to stop the medicine until she returns from her trip and we would reevaluate. 06/11/2020 phone call stating after stopping the Rexulti she  was said, depressed and crying all the time and wanted to return to Rexulti at 1 mg daily to which that was agreed. 07/02/2020 she called back wanting to milligram tablets of Rexulti because she had increased it on her own to that dosage and felt better.  She also had stopped drinking wine in the evening which she felt like helped.  So at her request a prescription was submitted for 2 mg Rexulti to the pharmacy. 07/16/2020 phone call stating she was very lonely and wanted to go back up to 3 mg of Rexulti.  That request was refused because of her previous side effects and making frequent changes with a med with a very long half-life like Rexulti is not a good idea.  07/21/2020 appointment with the following noted: Pretty desperate to do something. She increased Rexulti AMA to 3 mg despite being asked not to do so. Still sleepy and anxious.  Stopped wine for several weeks Ativan 1 -2 mg daily at work bc very anxious. Yesterday when son was there she wasn't sleepy or depressed until he left. Denies drowsiness when driving. Feels no better with grief.   No therapy since husband died.  Past Psychiatric Medication Trials:  Zoloft 300, Lexapro,, paroxetine 80 in 2017 Paliperidone, olanzapine, aripiprazole 20 mg, buspirone, Rexulti 3 mg sleepy clonidine, topiramate, gabapentin, metformin, phentermine, trazodone, Ambien Ativan,  Remote history topiramate for migraine 1998, Belviq helped lose 85#  Review of Systems:  Review of Systems  Constitutional: Positive for fatigue. Negative for unexpected weight change.       Sweating  Cardiovascular: Negative for chest pain.  Gastrointestinal: Negative for rectal pain.  Genitourinary: Negative for pelvic pain.  Musculoskeletal: Positive for myalgias.  Neurological: Negative for tremors and weakness.  Psychiatric/Behavioral: Positive for depression. Negative for agitation, behavioral problems, confusion, decreased concentration, dysphoric mood,  hallucinations, self-injury, sleep disturbance and suicidal ideas. The patient is not nervous/anxious and is not hyperactive.     Medications: I have reviewed the patient's current medications.  Current Outpatient Medications  Medication Sig Dispense Refill  . albuterol (PROAIR HFA) 108 (90 BASE) MCG/ACT inhaler 2 puffs every 4 hours as needed only  if your can't catch your breath 1 Inhaler 1  . amLODipine (NORVASC) 5 MG tablet TAKE 1 TABLET BY MOUTH ONCE DAILY 90 tablet 1  . B Complex Vitamins (B-COMPLEX/B-12) LIQD Place 5 mLs under the tongue daily at 2 PM daily at 2 PM.    . brexpiprazole (REXULTI) 2 MG TABS tablet Take 1 tablet (2 mg total) by mouth daily. (Patient taking differently: Take 2 mg by mouth daily. 3 mg AMA for several days) 30 tablet 1  . celecoxib (CELEBREX) 200 MG capsule Take 200 mg by mouth daily as needed.   2  . Cholecalciferol (VITAMIN D) 125 MCG (5000 UT) CAPS  Take 5,000 Units by mouth daily.     Marland Kitchen estradiol (ESTRACE) 0.5 MG tablet Take 1 tablet (0.5 mg total) by mouth daily. 30 tablet 3  . fexofenadine (ALLEGRA) 180 MG tablet Take 180 mg by mouth daily.    Marland Kitchen gabapentin (NEURONTIN) 800 MG tablet TAKE 1 TABLET BY MOUTH FOUR TIMES DAILY 120 tablet 3  . hydrochlorothiazide (MICROZIDE) 12.5 MG capsule TAKE 1 CAPSULE BY MOUTH ONCE DAILY 90 capsule 0  . LORazepam (ATIVAN) 1 MG tablet Take 1 tablet (1 mg total) by mouth daily as needed. 30 tablet 1  . mometasone (NASONEX) 50 MCG/ACT nasal spray Place 2 sprays into the nose daily. 17 g 3  . montelukast (SINGULAIR) 10 MG tablet TAKE 1 TABLET BY MOUTH ONCE DAILY 90 tablet 0  . nebivolol (BYSTOLIC) 10 MG tablet Take 1 tablet (10 mg total) by mouth 2 (two) times daily. 180 tablet 0  . NP THYROID 120 MG tablet TAKE 1 TABLET BY MOUTH ONCE DAILY 30 tablet 2  . pantoprazole (PROTONIX) 40 MG tablet Take 1 tablet (40 mg total) by mouth 2 (two) times daily. 60 tablet 2  . progesterone (PROMETRIUM) 200 MG capsule Take 2 capsules (400 mg  total) by mouth every evening. (Patient taking differently: Take 200 mg by mouth every evening. ) 60 capsule 3  . sertraline (ZOLOFT) 100 MG tablet Take 3 tablets (300 mg total) by mouth daily. (Patient taking differently: Take 200 mg by mouth daily. ) 270 tablet 0  . topiramate (TOPAMAX) 25 MG tablet Take 3 tablets (75 mg total) by mouth 2 (two) times daily. 180 tablet 1  . Travoprost, BAK Free, (TRAVATAN) 0.004 % SOLN ophthalmic solution Place 1 drop into both eyes at bedtime.    Marland Kitchen zolpidem (AMBIEN) 10 MG tablet TAKE 1/2-1 TABLET BY MOUTH AT BEDTIME AS NEEDED 30 tablet 5   No current facility-administered medications for this visit.    Medication Side Effects: Other: ? sweating related.  Allergies:  Allergies  Allergen Reactions  . Levaquin [Levofloxacin] Other (See Comments)    MUSCLE PAIN  . Nickel Rash    Past Medical History:  Diagnosis Date  . Allergy   . Anxiety   . Bronchiectasis   . Bronchitis    chronic  . 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 COPD (chronic obstructive pulmonary disease) ([place])   . Dental crown present    upper  . Depression   . Environmental allergies    receives allergy shots  . GERD (gastroesophageal reflux disease)   . 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 - no current med.  . Glucose intolerance (impaired glucose tolerance)    on Metformin  . H/O hiatal hernia    "sliding"  . History of echocardiogram    Echo 1/16: EF 60-65, no RWMA, Gr 1 DD, mild LAE, trivial pericardial eff post to heart  . History of echocardiogram    Echo 11/17: EF 55-60, no RWMA, Gr 1 DD, normal RV function.  Marland Kitchen History of palpitations    Holter 12/15: NSR, PACs  . Hx of migraines   . Hypothyroidism   . Osteoarthritis    knees  . Positional headache since 1997   if lies on left side    Family History  Problem Relation Age of Onset  . Lung cancer Father 23       dies age 51  . Emphysema Father   . COPD Brother 85  died age 33  . Stroke  Mother   . Hypertension Sister   . Thyroid disease Sister        Graves Disease    Social History   Socioeconomic History  . Marital status: Widowed    Spouse name: Not on file  . Number of children: Not on file  . Years of education: Not on file  . Highest education level: Not on file  Occupational History  . Occupation: Land    Employer: [employer]  Tobacco Use  . Smoking status: Never Smoker  . Smokeless tobacco: Never Used  Vaping Use  . Vaping Use: Never used  Substance and Sexual Activity  . Alcohol use: Yes    Alcohol/week: 4.0 standard drinks    Types: 4 Glasses of wine per week  . Drug use: No  . Sexual activity: Yes    Partners: Male    Birth control/protection: Post-menopausal  Other Topics Concern  . Not on file  Social History Narrative   Widowed late 2019 after 40+ years of marriage   3 sons one daughter   Working as a Company secretary for [employer]   No/never tobacco no drug use 1 caffeinated beverage daily, occasional or rare glass of wine   Social Determinants of Radio broadcast assistant Strain:   . Difficulty of Paying Living Expenses:   Food Insecurity:   . Worried About [name] in the Last Year:   . Arboriculturist in the Last Year:   Transportation Needs:   . Film/video editor (Medical):   Marland Kitchen Lack of Transportation (Non-Medical):   Physical Activity:   . Days of Exercise per Week:   . Minutes of Exercise per Session:   Stress:   . Feeling of Stress :   Social Connections:   . Frequency of Communication with Friends and Family:   . Frequency of Social Gatherings with Friends and Family:   . Attends Religious Services:   . Active Member of Clubs or Organizations:   . Attends Archivist Meetings:   Marland Kitchen Marital Status:   Intimate Partner Violence:   . Fear of Current or Ex-Partner:   . Emotionally Abused:   Marland Kitchen Physically Abused:   . Sexually Abused:     Past Medical History, Surgical  history, Social history, and Family history were reviewed and updated as appropriate.   Please see review of systems for further details on the patient's review from today.   Objective:   Physical Exam:  LMP 12/12/2005   Physical Exam Constitutional:      General: She is not in acute distress.    Appearance: She is well-developed. She is obese.  Musculoskeletal:        General: No deformity.  Neurological:     Mental Status: She is alert and oriented to person, place, and time.     Cranial Nerves: No dysarthria.     Coordination: Coordination normal.  Psychiatric:        Attention and Perception: Perception normal. She is attentive. She does not perceive auditory or visual hallucinations.        Mood and Affect: Mood is anxious and depressed. Affect is tearful. Affect is not labile, blunt, angry or inappropriate.        Speech: Speech normal. Speech is not slurred.        Behavior: Behavior normal. Behavior is not slowed. Behavior is cooperative.  Thought Content: Thought content normal. Thought content is not paranoid or delusional. Thought content does not include homicidal or suicidal ideation. Thought content does not include homicidal or suicidal plan.        Cognition and Memory: Cognition and memory normal.     Comments: Affect labile Fair insight and judgment   Gained 100# while H sick.  Lab Review:     Component Value Date/Time   NA 140 11/19/2019 1706   K 4.7 11/19/2019 1706   CL 103 11/19/2019 1706   CO2 26 11/19/2019 1706   GLUCOSE 98 11/19/2019 1706   BUN 19 11/19/2019 1706   CREATININE 1.09 (H) 11/19/2019 1706   CALCIUM 9.6 11/19/2019 1706   PROT 6.9 11/19/2019 1706   ALBUMIN 4.4 02/05/2015 0850   AST 19 11/19/2019 1706   ALT 26 11/19/2019 1706   ALKPHOS 53 02/05/2015 0850   BILITOT 0.3 11/19/2019 1706   GFRNONAA 54 (L) 11/19/2019 1706   GFRAA 62 11/19/2019 1706       Component Value Date/Time   WBC 10.2 03/23/2018 1244   RBC 4.32 03/23/2018  1244   HGB 13.5 03/23/2018 1244   HCT 40.0 03/23/2018 1244   PLT 406.0 (H) 03/23/2018 1244   MCV 92.6 03/23/2018 1244   MCH 31.6 01/31/2018 1813   MCHC 33.9 03/23/2018 1244   RDW 13.9 03/23/2018 1244   LYMPHSABS 2.5 03/23/2018 1244   MONOABS 0.6 03/23/2018 1244   EOSABS 0.1 03/23/2018 1244   BASOSABS 0.1 03/23/2018 1244    No results found for: POCLITH, LITHIUM   No results found for: PHENYTOIN, PHENOBARB, VALPROATE, CBMZ   .res Assessment: Plan:    Tawni was seen today for follow-up, depression, anxiety and medication problem.  Diagnoses and all orders for this visit:  Severe episode of recurrent major depressive disorder, without psychotic features ([place])  Complicated grief  Panic disorder with agoraphobia  Generalized anxiety disorder     Supportive therapy dealing with D Mary Tate's mental health problems.  Answered questions about trying to get her help.  Greater than 50% of 30 minface to face time with patient was spent on counseling and coordination of care. We discussed Disc the difference between depression and grief.  Her H would not talk about his cancer and wouldn't prepare her for his death. Supportive and grief work.  Rec therapeutic letter writing as she is still having a problem. Her panic disorder is generally controlled but more anxious somewhat.. She is still dealing with a lot of grief with waves of depression and anxiety and at times easily overwhelmed but functional.  Disc importance of staying busy to help with depression and anxiety  Consider switch to SNRI,.But this is less likely to help anxiety.  Explained that frequent changes in [place] is sabotaging treatment.  Told her to stop making any med changes without my consent. She is still sleepy on the meds.  Not sleepy Explained long half life of Rexulti means she is likely to get more SE as time goes on including more sleepiness.  She now is saying the sleepiness may have been progesterone and  not Rexulti. Stop Rexulti DT sleepiness.   Start Latuda 20 mg daily but she refuses bc doesn't think she can eat enough with it.  Then says she'll try it.   Discussed the possibility this could be contributing to weight gain.  Realistically the only alternative would be fluoxetine and it is not as good of an anxiety med.  Her anxiety has  been severe in the past.  I would advise against changing or reducing the dose at this time because she has recently had a little more depression and anxiety than baseline.  She is not using the lorazepam excessively.  She is taking it appropriately.  The diazepam is prescribed vaginally by her gynecologist for chronic pelvic pain and not being used. We discussed the short-term risks associated with benzodiazepines including sedation and increased fall risk among others.  Discussed long-term side effect risk including dependence, potential withdrawal symptoms, and the potential eventual dose-related risk of dementia.  Disc the risk of Ambien amnesia.  Prefer avoid stimulant weight loss meds per PCP.  Naltrexone vs topiramate.  She tolerated topiramate for a while in the past for migraine.  Topiramate She would like to increase further; OK increase to 75 mg BID.   To help weight and anxiety off label. She has been successful losing weight on this.  It also is used for HA.   Naltrexone or metformin or Ozempic  are other options.  Pain is better but not gone and in therapy.  Needs counseling desperately.  She commits.  This appt was 30 mins.  FU 8 weeks  Lynder Parents, MD, DFAPA  Please see After Visit Summary for patient specific instructions.    Future Appointments  Date Time Provider [place]  08/06/2020  4:00 PM Baird Lyons D, MD [facility] None    No orders of the defined types were placed in this encounter.     -------------------------------

## 2020-07-21 NOTE — Patient Instructions (Signed)
Counselor Alvester Chou PhD

## 2020-07-25 MED FILL — MOMETASONE FUROATE 50 MCG S: 50 | 30 days supply | Qty: 17 | Fill #1

## 2020-07-25 MED FILL — TOPIRAMATE 25 MG TABLET: 25 | 30 days supply | Qty: 180 | Fill #1

## 2020-07-25 MED FILL — GABAPENTIN 800 MG TABS: 800 | 30 days supply | Qty: 120 | Fill #1

## 2020-07-27 ENCOUNTER — Other Ambulatory Visit (INDEPENDENT_AMBULATORY_CARE_PROVIDER_SITE_OTHER): Payer: Self-pay | Admitting: Nurse Practitioner

## 2020-07-27 ENCOUNTER — Telehealth (INDEPENDENT_AMBULATORY_CARE_PROVIDER_SITE_OTHER): Payer: Self-pay | Admitting: Nurse Practitioner

## 2020-07-27 MED FILL — MONTELUKAST SOD 10 MG TAB: 10 | 90 days supply | Qty: 90 | Fill #0

## 2020-07-27 NOTE — Telephone Encounter (Signed)
Please call this patient and let her know that I can not refill her hydrochlorothiazide (blood pressure medication). According to chart review she has not been seen in the office for quite some time. She will need a follow-up appointment scheduled before additional refills will be sent.

## 2020-07-29 ENCOUNTER — Telehealth: Payer: Self-pay | Admitting: Psychiatry

## 2020-07-29 NOTE — Telephone Encounter (Signed)
Pt called regarding Latuda to be effective and does it help with panic attacks and scared to get up in the AM. [phone].

## 2020-07-29 NOTE — Telephone Encounter (Signed)
Patient has struggled with severe anxiety causing reassurance seeking behaviors and tremendous ambivalence about medications.  She was just seen 7 or 8 days ago and agreed to start Latuda 20 mg daily with food but again she was highly ambivalent about taking it.  Unless there is a new problem or concern my suggestion is that she continue on the Latuda 20 mg daily.  She has samples for at least 2 or 3 weeks.

## 2020-07-30 NOTE — Telephone Encounter (Signed)
Left message with information and to call back with further concerns or questions.

## 2020-07-30 NOTE — Telephone Encounter (Signed)
Patient aware of increase to 40 mg daily. She has 3 full packs of 20 mg and 5 tablets of 20 mg left. Instructed to call back with an update when running low on samples. She agreed. Also advised her okay to take up to 3 mg of Ativan /day, she can take 1 mg every 6-8 hours as long as she doesn't get too drowsy. She said she doesn't need any yet because she has only been taking 1/2 tablet to save them. She was very reassured and appreciative of our talk.

## 2020-07-30 NOTE — Telephone Encounter (Signed)
Patient called back and she's struggling, she did make it into work today but she hasn't been able to go last 2 days. She's been taking Latuda 20 mg daily with food for 9 days, she's very tearful and emotional. Her anxiety is worse, she's already taken 1 ativan this morning and only allowed 1/day. Can she take any extra until medications get leveled out more? She's having a lot of fear, fear of getting up in the morning, fear of getting in the shower.  She has apt with Alvester Chou tomorrow via zoom.

## 2020-07-30 NOTE — Telephone Encounter (Signed)
BC she is desperate, She can increase the Latuda to 40 mg daily if she's not too sleepy with it.  The [place] may work in a week but we need to give it 2 weeks to make a difference.

## 2020-07-31 DIAGNOSIS — F332 Major depressive disorder, recurrent severe without psychotic features: Secondary | ICD-10-CM | POA: Diagnosis not present

## 2020-07-31 DIAGNOSIS — F411 Generalized anxiety disorder: Secondary | ICD-10-CM | POA: Diagnosis not present

## 2020-07-31 DIAGNOSIS — F438 Other reactions to severe stress: Secondary | ICD-10-CM | POA: Diagnosis not present

## 2020-08-05 ENCOUNTER — Telehealth: Payer: Self-pay | Admitting: Psychiatry

## 2020-08-05 NOTE — Telephone Encounter (Signed)
Patient scheduled for 08/27/20 @ 10:00

## 2020-08-05 NOTE — Telephone Encounter (Signed)
LM for patient to call back. And also advised to give the medication longer to work. She just increased the Latuda to 40 mg.

## 2020-08-05 NOTE — Telephone Encounter (Signed)
Spoke with patient again, she increased latuda to 40 mg last week as advised after calling c/o increase with crying and afraid to be alone. She reports she is worse since starting Latuda, tried explaining that the medication needs time to work and she has only been taking for 2 weeks since starting on 40 mg. She asking if something else can be added to it as well? The call did get disconnected, tried calling back again and left her a message that I would discuss things with Dr. Clovis Pu again. Tried verifying she's eating at least 350 calories but the phone disconnected at that time.   Please review

## 2020-08-05 NOTE — Telephone Encounter (Signed)
As noted she has been on Latuda 40 mg for 7 days.  If she has no improvement in another 7 days then call us and we will consider increasing the dosage.  She is already on an SSRI and a benzodiazepine.  It would be inappropriate and potentially sabotaging to add another medication on top of this.  We need to give the Latuda a chance to work it is a good medication.

## 2020-08-05 NOTE — Telephone Encounter (Signed)
Pt called and said that she was suppose to hear from [place] on Thursday and never received a call. She is not doing well and was in tears. She would like to talk to Vincent today. Please call her at [phone]. Dr. Clovis Pu does have an opening at 4 pm today

## 2020-08-06 ENCOUNTER — Ambulatory Visit: Payer: 59 | Admitting: Internal Medicine

## 2020-08-06 ENCOUNTER — Other Ambulatory Visit: Payer: Self-pay

## 2020-08-06 DIAGNOSIS — F438 Other reactions to severe stress: Secondary | ICD-10-CM | POA: Diagnosis not present

## 2020-08-06 DIAGNOSIS — F332 Major depressive disorder, recurrent severe without psychotic features: Secondary | ICD-10-CM | POA: Diagnosis not present

## 2020-08-06 DIAGNOSIS — F411 Generalized anxiety disorder: Secondary | ICD-10-CM | POA: Diagnosis not present

## 2020-08-06 MED ORDER — HALOPERIDOL 2 MG PO TABS: ORAL_TABLET | ORAL | 0 refills | Status: DC

## 2020-08-06 MED FILL — HALOPERIDOL 2 MG TABLET: 2 | 15 days supply | Qty: 30 | Fill #0

## 2020-08-06 NOTE — Telephone Encounter (Signed)
Discussed patient's symptoms today with Dr. Clovis Pu, informed him I spoke again  with patient and her friend. Her friend said she's the worst she's ever seen her and she's really worried. This friend did stay with her, and another friend is coming later today. Patient has such a fear of being at home, taking a shower, going room to room. Her anxiety is extreme and the ativan doesn't help much. Discussed with her about taking off work, but she does not want to do that. Says she's better at work and they are aware of her situation. 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 Friend also feels patient has gotten worse since starting Latuda 2 weeks ago. After discussion with Dr. Clovis Pu, he advises her to stop Latuda 40 mg and start haloperidol 2 mg 1-2 tablets at hs. This is to get her anxiety and fearfullness stabilized, then her depression can be better managed. Patient was extemely worried about trying haloperidol, I explained it's a very low dose and this is just to get her feeling better and stabilized. Informed her it doesn't mean she will be on it forever but she needs something that is fast acting, she did agree and asked it be sent to [facility] and she will pick it up. Advised her to give it a chance before calling back that it's not working. She agreed and was extremely appreciative of all the phone calls.

## 2020-08-06 NOTE — Telephone Encounter (Signed)
Thank you :)

## 2020-08-09 MED FILL — PANTOPRAZOLE SOD DR 40 MG T: 40 | 30 days supply | Qty: 60 | Fill #1

## 2020-08-10 ENCOUNTER — Other Ambulatory Visit: Payer: Self-pay | Admitting: Psychiatry

## 2020-08-10 ENCOUNTER — Other Ambulatory Visit (INDEPENDENT_AMBULATORY_CARE_PROVIDER_SITE_OTHER): Payer: Self-pay | Admitting: Nurse Practitioner

## 2020-08-10 DIAGNOSIS — F4001 Agoraphobia with panic disorder: Secondary | ICD-10-CM

## 2020-08-10 MED FILL — BYSTOLIC 10 MG TABLET: 10 | 30 days supply | Qty: 60 | Fill #3

## 2020-08-10 MED FILL — HYDROCHLOROTHIAZIDE 12.5 MG: 12.5 | 90 days supply | Qty: 90 | Fill #0

## 2020-08-10 NOTE — Telephone Encounter (Signed)
Please send

## 2020-08-11 MED FILL — LORazepam 1 MG TABS: 1 | 15 days supply | Qty: 60 | Fill #0

## 2020-08-12 DIAGNOSIS — F411 Generalized anxiety disorder: Secondary | ICD-10-CM | POA: Diagnosis not present

## 2020-08-12 DIAGNOSIS — Z634 Disappearance and death of family member: Secondary | ICD-10-CM | POA: Diagnosis not present

## 2020-08-12 DIAGNOSIS — F331 Major depressive disorder, recurrent, moderate: Secondary | ICD-10-CM | POA: Diagnosis not present

## 2020-08-13 DIAGNOSIS — F332 Major depressive disorder, recurrent severe without psychotic features: Secondary | ICD-10-CM | POA: Diagnosis not present

## 2020-08-13 DIAGNOSIS — F438 Other reactions to severe stress: Secondary | ICD-10-CM | POA: Diagnosis not present

## 2020-08-14 ENCOUNTER — Telehealth: Payer: Self-pay | Admitting: Psychiatry

## 2020-08-14 NOTE — Telephone Encounter (Signed)
This has been ongoing with multiple phone calls and medication adjustments.

## 2020-08-14 NOTE — Telephone Encounter (Signed)
Pt called crying stating she is very depressed and need a call back [phone]

## 2020-08-18 ENCOUNTER — Other Ambulatory Visit: Payer: Self-pay | Admitting: Psychiatry

## 2020-08-18 ENCOUNTER — Telehealth: Payer: Self-pay | Admitting: Psychiatry

## 2020-08-18 MED ORDER — DULOXETINE HCL 30 MG PO CPEP: ORAL_CAPSULE | ORAL | 1 refills | Status: DC

## 2020-08-18 MED FILL — DULoxetine HCL 30 MG CPEP: 30 | 30 days supply | Qty: 75 | Fill #0

## 2020-08-18 NOTE — Telephone Encounter (Signed)
Please review

## 2020-08-18 NOTE — Telephone Encounter (Signed)
Pt called back this am and said she never received a call from anyone on Friday. She is still very depressed and wants medicine for depression or be seen by a different provider this week since dr. Clovis Pu is on vacation

## 2020-08-18 NOTE — Progress Notes (Signed)
Switching from sertraline to duloxetine.

## 2020-08-18 NOTE — Telephone Encounter (Signed)
Please send

## 2020-08-18 NOTE — Telephone Encounter (Signed)
RTC  Tried haloperidol 1 mg and didn't help anything.  No effect except a little sleepiness.  Took it sparingly bc afraid of it.  Afraid of tremors and TD.  I didn't to give it a good chance.  Strongly rec ECT.  Currently will consider and agrees to go to [facility] for consult.  I'm scared.  Scared of meds too.  Currently on sertraline 200 mg daily.  Reduce sertraline to 150 daily and start duloxetine 30 mg capsule daily for 5 days, Then reduce sertraline to 100 mg and increase duloxetine to 2 capsules daily for 5 days, Then reduce sertraline to 1/2 of 100 mg aily and increase duloxetine to 90 mg daily for 5 days then stop sertraline and continue duloxetine 90 mg daily. Disc SE.  FU as scheduled.

## 2020-08-19 ENCOUNTER — Other Ambulatory Visit: Payer: Self-pay | Admitting: Psychiatry

## 2020-08-19 DIAGNOSIS — F411 Generalized anxiety disorder: Secondary | ICD-10-CM | POA: Diagnosis not present

## 2020-08-19 DIAGNOSIS — F331 Major depressive disorder, recurrent, moderate: Secondary | ICD-10-CM | POA: Diagnosis not present

## 2020-08-19 DIAGNOSIS — Z634 Disappearance and death of family member: Secondary | ICD-10-CM | POA: Diagnosis not present

## 2020-08-19 MED FILL — ZOLPIDEM TARTRATE 10 MG TAB: 10 | 30 days supply | Qty: 30 | Fill #0

## 2020-08-19 NOTE — Telephone Encounter (Signed)
I have faxed the referral for [name] [name] to [facility].

## 2020-08-19 NOTE — Telephone Encounter (Signed)
Noted thank you Leda Gauze

## 2020-08-21 ENCOUNTER — Other Ambulatory Visit (INDEPENDENT_AMBULATORY_CARE_PROVIDER_SITE_OTHER): Payer: Self-pay | Admitting: Nurse Practitioner

## 2020-08-24 DIAGNOSIS — Z634 Disappearance and death of family member: Secondary | ICD-10-CM | POA: Diagnosis not present

## 2020-08-24 DIAGNOSIS — F331 Major depressive disorder, recurrent, moderate: Secondary | ICD-10-CM | POA: Diagnosis not present

## 2020-08-24 DIAGNOSIS — F411 Generalized anxiety disorder: Secondary | ICD-10-CM | POA: Diagnosis not present

## 2020-08-24 MED FILL — NP THYROID 60 MG TABLET: 60 | 30 days supply | Qty: 60 | Fill #0

## 2020-08-26 DIAGNOSIS — Z20822 Contact with and (suspected) exposure to covid-19: Secondary | ICD-10-CM | POA: Diagnosis not present

## 2020-08-27 ENCOUNTER — Encounter (INDEPENDENT_AMBULATORY_CARE_PROVIDER_SITE_OTHER): Payer: Self-pay | Admitting: Nurse Practitioner

## 2020-08-27 ENCOUNTER — Ambulatory Visit (INDEPENDENT_AMBULATORY_CARE_PROVIDER_SITE_OTHER): Payer: 59 | Admitting: Nurse Practitioner

## 2020-08-27 ENCOUNTER — Other Ambulatory Visit: Payer: Self-pay

## 2020-08-27 VITALS — BP 123/78 | HR 73 | Temp 97.2°F | Resp 20 | Wt 192.0 lb

## 2020-08-27 DIAGNOSIS — E039 Hypothyroidism, unspecified: Secondary | ICD-10-CM | POA: Diagnosis not present

## 2020-08-27 DIAGNOSIS — I10 Essential (primary) hypertension: Secondary | ICD-10-CM

## 2020-08-27 DIAGNOSIS — E2839 Other primary ovarian failure: Secondary | ICD-10-CM

## 2020-08-27 DIAGNOSIS — F419 Anxiety disorder, unspecified: Secondary | ICD-10-CM | POA: Diagnosis not present

## 2020-08-27 DIAGNOSIS — E559 Vitamin D deficiency, unspecified: Secondary | ICD-10-CM

## 2020-08-27 MED ORDER — PROGESTERONE MICRONIZED 100 MG PO CAPS: 100.0000 mg | ORAL_CAPSULE | Freq: Every day | ORAL | 0 refills | Status: DC

## 2020-08-27 MED ORDER — BUSPIRONE HCL 7.5 MG PO TABS: 7.5000 mg | ORAL_TABLET | Freq: Three times a day (TID) | ORAL | 1 refills | Status: DC

## 2020-08-27 MED FILL — busPIRone HCL 7.5 MG TABS: 7.5 | 30 days supply | Qty: 90 | Fill #0

## 2020-08-27 MED FILL — PROGESTERONE 100 MG CAPS: 100 | 90 days supply | Qty: 90 | Fill #0

## 2020-08-27 NOTE — Progress Notes (Signed)
Subjective:  Patient ID: [name] [name], female    DOB: [birth year]  Age: 64 y.o. MRN: [id number]  CC:  Chief Complaint  Patient presents with  . Depression  . Follow-up    Vitamin D deficiency  . Hypertension  . Hypothyroidism      HPI  This patient arrives today for the above.  Depression: She reports experiencing quite a bit of anxiety and depression.  Reported today at her office visit, and tells me she feels isolated at home and was not able to socialize with her family or friends as often as she would like to.  She thinks this is triggering her anxiety.  She is following with allergy and has recently undergone multiple medication changes which she also believes is contributing to her anxiety.  She would like to know if there is anything else she can take to help manage her anxiety.  Vitamin D deficiency: She continues on a vitamin D supplement and was due for serum check today.  Hypertension: She continues on her antihypertensives and is due for blood work today.  Hypothyroidism: She continues on her desiccated thyroid and is due to have thyroid levels checked today.  Of note while reviewing patient's medications she does have estradiol listed on her medication list and reports taking this regularly, however she is not taking progesterone.  She does have an intact uterus.  She denies any vaginal bleeding.  Past Medical History:  Diagnosis Date  . Allergy   . Anxiety   . Bronchiectasis   . Bronchitis    chronic  . 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 COPD (chronic obstructive pulmonary disease) ([place])   . Dental crown present    upper  . Depression   . Environmental allergies    receives allergy shots  . GERD (gastroesophageal reflux disease)   . 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 - no current med.  . Glucose intolerance (impaired glucose tolerance)    on Metformin  . H/O hiatal hernia    "sliding"  . History of echocardiogram    Echo  1/16: EF 60-65, no RWMA, Gr 1 DD, mild LAE, trivial pericardial eff post to heart  . History of echocardiogram    Echo 11/17: EF 55-60, no RWMA, Gr 1 DD, normal RV function.  Marland Kitchen History of palpitations    Holter 12/15: NSR, PACs  . Hx of migraines   . Hypothyroidism   . Osteoarthritis    knees  . Positional headache since 1997   if lies on left side      Family History  Problem Relation Age of Onset  . Lung cancer Father 30       dies age 56  . Emphysema Father   . COPD Brother 46       died age 33  . Stroke Mother   . Hypertension Sister   . Thyroid disease Sister        Graves Disease    Social History   Social History Narrative   Widowed late 2019 after 40+ years of marriage   3 sons one daughter   Working as a Company secretary for [employer]   No/never tobacco no drug use 1 caffeinated beverage daily, occasional or rare glass of wine   Social History   Tobacco Use  . Smoking status: Never Smoker  . Smokeless tobacco: Never Used  Substance Use Topics  . Alcohol use: Yes  Alcohol/week: 4.0 standard drinks    Types: 4 Glasses of wine per week     Current Meds  Medication Sig  . albuterol (PROAIR HFA) 108 (90 BASE) MCG/ACT inhaler 2 puffs every 4 hours as needed only  if your can't catch your breath  . amLODipine (NORVASC) 5 MG tablet TAKE 1 TABLET BY MOUTH ONCE DAILY  . B Complex Vitamins (B-COMPLEX/B-12) LIQD Place 5 mLs under the tongue daily at 2 PM daily at 2 PM.  . Cholecalciferol (VITAMIN D) 125 MCG (5000 UT) CAPS Take 5,000 Units by mouth daily.   . DULoxetine (CYMBALTA) 30 MG capsule 1  Daily for 5 days then 2 daily for 5 days then 3 daily  . estradiol (ESTRACE) 0.5 MG tablet Take 1 tablet (0.5 mg total) by mouth daily.  . fexofenadine (ALLEGRA) 180 MG tablet Take 180 mg by mouth daily.  Marland Kitchen gabapentin (NEURONTIN) 800 MG tablet TAKE 1 TABLET BY MOUTH FOUR TIMES DAILY  . hydrochlorothiazide (MICROZIDE) 12.5 MG capsule TAKE 1 CAPSULE BY MOUTH  ONCE DAILY  . LORazepam (ATIVAN) 1 MG tablet Take 1 tablet (1 mg total) by mouth every 6 (six) hours as needed.  . mometasone (NASONEX) 50 MCG/ACT nasal spray Place 2 sprays into the nose daily.  . montelukast (SINGULAIR) 10 MG tablet TAKE 1 TABLET BY MOUTH ONCE DAILY  . nebivolol (BYSTOLIC) 10 MG tablet Take 1 tablet (10 mg total) by mouth 2 (two) times daily.  . NP THYROID 60 MG tablet TAKE 2 TABLETS BY MOUTH ONCE A DAY  . pantoprazole (PROTONIX) 40 MG tablet Take 1 tablet (40 mg total) by mouth 2 (two) times daily.  Marland Kitchen topiramate (TOPAMAX) 25 MG tablet Take 3 tablets (75 mg total) by mouth 2 (two) times daily.  . Travoprost, BAK Free, (TRAVATAN) 0.004 % SOLN ophthalmic solution Place 1 drop into both eyes at bedtime.  Marland Kitchen zolpidem (AMBIEN) 10 MG tablet TAKE 1/2 TO 1 TABLET BY MOUTH AT BEDTIME AS NEEDED    ROS:  See HPI   Objective:   Today's Vitals: BP 123/78 (BP Location: Right Arm, Patient Position: Sitting, Cuff Size: Normal)   Pulse 73   Temp (!) 97.2 F (36.2 C) (Temporal)   Resp 20   Wt 192 lb (87.1 kg)   LMP 12/12/2005   SpO2 98%   BMI 36.28 kg/m  Vitals with BMI 08/27/2020 01/01/2020 11/19/2019  Height - 5' 1"  5' 1"   Weight 192 lbs 239 lbs 239 lbs 3 oz  BMI - 25.00 37.04  Systolic 888 - 916  Diastolic 78 - 80  Pulse 73 - 64     Physical Exam Vitals reviewed.  Constitutional:      General: She is not in acute distress.    Appearance: Normal appearance.  HENT:     Head: Normocephalic and atraumatic.  Neck:     Vascular: No carotid bruit.  Cardiovascular:     Rate and Rhythm: Normal rate and regular rhythm.     Pulses: Normal pulses.     Heart sounds: Normal heart sounds.  Pulmonary:     Effort: Pulmonary effort is normal.     Breath sounds: Normal breath sounds.  Skin:    General: Skin is warm and dry.  Neurological:     General: No focal deficit present.     Mental Status: She is alert and oriented to person, place, and time.  Psychiatric:        Mood  and Affect: Mood normal.  Behavior: Behavior normal.        Judgment: Judgment normal.          Assessment and Plan   1. Anxiety   2. Primary ovarian failure   3. Essential hypertension   4. Vitamin D deficiency disease   5. Hypothyroidism, unspecified type   6. Morbid obesity ([place])      Plan: 1.  Had a long conversation regarding her anxiety.  Encouraged her to continue following up with her psychiatrist and gave herself some grace in time to adjust to the medication changes.  She would like to try BuSpar as needed for anxiety prescription sent to pharmacy.  She is encouraged to call me with any questions or concerns. 2.  I recommended that she start taking the progesterone as she should not take estradiol without progesterone considering she has an intact uterus.  He tells me she understands, 138m of progesterone sent to pharmacy as 200 mg was causing her to feel sedated. 3.  She will continue on her current medications as prescribed for now.  She did mention that when she goes to Medicare she is concerned about affording her Bystolic.  May consider changing to a different agent in the near future. 4.  We will check vitamin D level today. 5.  We will check thyroid panel today. 6.  We will check blood work today, she has intentionally lost quite a bit of weight recently and I congratulated her on this.   Tests ordered Orders Placed This Encounter  Procedures  . CMP with eGFR([facility])  . Vitamin D, 25-hydroxy  . TSH  . T3, Free  . T4, Free  . CBC with Differential/Platelets  . Lipid Panel  . Hemoglobin A1c      Meds ordered this encounter  Medications  . progesterone (PROMETRIUM) 100 MG capsule    Sig: Take 1 capsule (100 mg total) by mouth daily.    Dispense:  90 capsule    Refill:  0    Order Specific Question:   Supervising Provider    Answer:   GHurshel PartyC [[[id number]] . busPIRone (BUSPAR) 7.5 MG tablet    Sig: Take 1 tablet (7.5 mg total) by mouth 3  (three) times daily.    Dispense:  90 tablet    Refill:  1    Order Specific Question:   Supervising Provider    Answer:   GDoree Albee[[[id number]]   Patient to follow-up in 1 month with Dr. GAnastasio Championfor annual physical exam.  SAilene Ards NP

## 2020-08-28 ENCOUNTER — Telehealth (INDEPENDENT_AMBULATORY_CARE_PROVIDER_SITE_OTHER): Payer: Self-pay | Admitting: Nurse Practitioner

## 2020-08-28 ENCOUNTER — Other Ambulatory Visit (INDEPENDENT_AMBULATORY_CARE_PROVIDER_SITE_OTHER): Payer: Self-pay | Admitting: Nurse Practitioner

## 2020-08-28 DIAGNOSIS — J302 Other seasonal allergic rhinitis: Secondary | ICD-10-CM

## 2020-08-28 DIAGNOSIS — J4540 Moderate persistent asthma, uncomplicated: Secondary | ICD-10-CM

## 2020-08-28 DIAGNOSIS — E876 Hypokalemia: Secondary | ICD-10-CM

## 2020-08-28 DIAGNOSIS — E559 Vitamin D deficiency, unspecified: Secondary | ICD-10-CM

## 2020-08-28 DIAGNOSIS — I10 Essential (primary) hypertension: Secondary | ICD-10-CM

## 2020-08-28 LAB — LIPID PANEL
Cholesterol: 236 mg/dL — ABNORMAL HIGH (ref <200)
HDL: 47 mg/dL — ABNORMAL LOW (ref >=50)
LDL Cholesterol (Calc): 167 mg/dL (calc) — ABNORMAL HIGH
Non-HDL Cholesterol (Calc): 189 mg/dL (calc) — ABNORMAL HIGH (ref <130)
Total CHOL/HDL Ratio: 5 (calc) — ABNORMAL HIGH (ref <5.0)
Triglycerides: 103 mg/dL (ref <150)

## 2020-08-28 LAB — CBC WITH DIFFERENTIAL/PLATELET
Absolute Monocytes: 468 cells/uL (ref 200–950)
Basophils Absolute: 17 cells/uL (ref 0–200)
Basophils Relative: 0.2 %
Eosinophils Absolute: 34 cells/uL (ref 15–500)
Eosinophils Relative: 0.4 %
HCT: 42.6 % (ref 35.0–45.0)
Hemoglobin: 14.4 g/dL (ref 11.7–15.5)
Lymphs Abs: 1148 cells/uL (ref 850–3900)
MCH: 31.3 pg (ref 27.0–33.0)
MCHC: 33.8 g/dL (ref 32.0–36.0)
MCV: 92.6 fL (ref 80.0–100.0)
MPV: 10.3 fL (ref 7.5–12.5)
Monocytes Relative: 5.5 %
Neutro Abs: 6834 cells/uL (ref 1500–7800)
Neutrophils Relative %: 80.4 %
Platelets: 293 10*3/uL (ref 140–400)
RBC: 4.6 10*6/uL (ref 3.80–5.10)
RDW: 12.5 % (ref 11.0–15.0)
Total Lymphocyte: 13.5 %
WBC: 8.5 10*3/uL (ref 3.8–10.8)

## 2020-08-28 LAB — COMPLETE METABOLIC PANEL WITH GFR
AG Ratio: 1.9 (calc) (ref 1.0–2.5)
ALT: 25 U/L (ref 6–29)
AST: 17 U/L (ref 10–35)
Albumin: 4.5 g/dL (ref 3.6–5.1)
Alkaline phosphatase (APISO): 78 U/L (ref 37–153)
BUN: 19 mg/dL (ref 7–25)
CO2: 29 mmol/L (ref 20–32)
Calcium: 9.7 mg/dL (ref 8.6–10.4)
Chloride: 98 mmol/L (ref 98–110)
Creat: 0.93 mg/dL (ref 0.50–0.99)
GFR, Est African American: 75 mL/min/{1.73_m2} (ref >=60)
GFR, Est Non African American: 65 mL/min/{1.73_m2} (ref >=60)
Globulin: 2.4 g/dL (calc) (ref 1.9–3.7)
Glucose, Bld: 119 mg/dL — ABNORMAL HIGH (ref 65–99)
Potassium: 3.4 mmol/L — ABNORMAL LOW (ref 3.5–5.3)
Sodium: 138 mmol/L (ref 135–146)
Total Bilirubin: 0.5 mg/dL (ref 0.2–1.2)
Total Protein: 6.9 g/dL (ref 6.1–8.1)

## 2020-08-28 LAB — TSH: TSH: 0.01 mIU/L — ABNORMAL LOW (ref 0.40–4.50)

## 2020-08-28 LAB — T3, FREE: T3, Free: 4.8 pg/mL — ABNORMAL HIGH (ref 2.3–4.2)

## 2020-08-28 LAB — HEMOGLOBIN A1C
Hgb A1c MFr Bld: 5.9 % of total Hgb — ABNORMAL HIGH (ref <5.7)
Mean Plasma Glucose: 123 (calc)
eAG (mmol/L): 6.8 (calc)

## 2020-08-28 LAB — VITAMIN D 25 HYDROXY (VIT D DEFICIENCY, FRACTURES): Vit D, 25-Hydroxy: 71 ng/mL (ref 30–100)

## 2020-08-28 LAB — T4, FREE: Free T4: 1.2 ng/dL (ref 0.8–1.8)

## 2020-08-28 MED ORDER — NEBIVOLOL HCL 10 MG PO TABS: 10.0000 mg | ORAL_TABLET | Freq: Two times a day (BID) | ORAL | 0 refills | Status: DC

## 2020-08-28 MED ORDER — PANTOPRAZOLE SODIUM 40 MG PO TBEC: 40.0000 mg | DELAYED_RELEASE_TABLET | Freq: Two times a day (BID) | ORAL | 2 refills | Status: DC

## 2020-08-28 MED ORDER — HYDROCHLOROTHIAZIDE 12.5 MG PO CAPS: 12.5000 mg | ORAL_CAPSULE | Freq: Every day | ORAL | 0 refills | Status: DC

## 2020-08-28 MED ORDER — ALBUTEROL SULFATE HFA 108 (90 BASE) MCG/ACT IN AERS: 2.0000 | INHALATION_SPRAY | Freq: Four times a day (QID) | RESPIRATORY_TRACT | 3 refills | Status: AC | PRN

## 2020-08-28 MED ORDER — MONTELUKAST SODIUM 10 MG PO TABS: 10.0000 mg | ORAL_TABLET | Freq: Every day | ORAL | 0 refills | Status: DC

## 2020-08-28 MED ORDER — GABAPENTIN 800 MG PO TABS
800.0000 mg | ORAL_TABLET | Freq: Four times a day (QID) | ORAL | 1 refills | Status: DC
Start: 2020-08-28 — End: 2021-03-19

## 2020-08-28 MED ORDER — AMLODIPINE BESYLATE 5 MG PO TABS: 5.0000 mg | ORAL_TABLET | Freq: Every day | ORAL | 0 refills | Status: DC

## 2020-08-28 MED ORDER — MOMETASONE FUROATE 50 MCG/ACT NA SUSP: 2.0000 | Freq: Every day | NASAL | 3 refills | Status: DC

## 2020-08-28 MED FILL — GABAPENTIN 800 MG TABS: 800 | 30 days supply | Qty: 120 | Fill #0

## 2020-08-28 MED FILL — MOMETASONE FUROATE 50 MCG S: 50 | 30 days supply | Qty: 17 | Fill #0

## 2020-08-28 MED FILL — AMLODIPINE BESYLATE 5 MG TA: 5 | 90 days supply | Qty: 90 | Fill #0

## 2020-08-28 MED FILL — ALBUTEROL SULFATE HFA 108 (: 108 (90 BAS | 25 days supply | Qty: 18 | Fill #0

## 2020-08-28 NOTE — Progress Notes (Signed)
Refills

## 2020-08-28 NOTE — Telephone Encounter (Signed)
Please call this patient and notify her that her potassium levels were slightly low. Please schedule her for a lab draw in the next 2-3 weeks so that I can recheck labs to monitor her potassium level closely.

## 2020-08-31 NOTE — Telephone Encounter (Signed)
Yes I placed them in epic.  I am not sure if [facility] uses [facility] or not, but hopefully with the lab being placed in epic will be able to see the order.

## 2020-08-31 NOTE — Telephone Encounter (Signed)
Did you put orders in? She will get at [facility] near work location.

## 2020-09-07 ENCOUNTER — Other Ambulatory Visit: Payer: Self-pay | Admitting: Psychiatry

## 2020-09-07 DIAGNOSIS — F411 Generalized anxiety disorder: Secondary | ICD-10-CM | POA: Diagnosis not present

## 2020-09-07 DIAGNOSIS — F331 Major depressive disorder, recurrent, moderate: Secondary | ICD-10-CM | POA: Diagnosis not present

## 2020-09-07 DIAGNOSIS — F4001 Agoraphobia with panic disorder: Secondary | ICD-10-CM

## 2020-09-07 DIAGNOSIS — Z634 Disappearance and death of family member: Secondary | ICD-10-CM | POA: Diagnosis not present

## 2020-09-07 MED FILL — NEBIVOLOL HCL TAB 10 MG: 10 | 30 days supply | Qty: 60 | Fill #4

## 2020-09-08 MED FILL — TOPIRAMATE 25 MG TABLET: 25 | 30 days supply | Qty: 180 | Fill #0

## 2020-09-08 NOTE — Telephone Encounter (Signed)
review 

## 2020-09-09 ENCOUNTER — Other Ambulatory Visit: Payer: Self-pay

## 2020-09-09 ENCOUNTER — Telehealth: Payer: Self-pay | Admitting: Psychiatry

## 2020-09-09 MED ORDER — TRAZODONE HCL 50 MG PO TABS: 50.0000 mg | ORAL_TABLET | Freq: Every day | ORAL | 0 refills | Status: DC

## 2020-09-09 MED FILL — traZODone HCL 50 MG TABS: 50 | 30 days supply | Qty: 30 | Fill #0

## 2020-09-09 NOTE — Telephone Encounter (Signed)
Pt is on her 4 th week of cymbalta and she has finished her zoloft. She has been crying non stop for 3 days. Today she is not crying because she is at work. She also is not sleeping. She only gets 3 to 4 hrs at all. Her appt is 10/12 but she can't wait that long for some help especially with her sleeping. She is asking for traci to call her at [phone]

## 2020-09-09 NOTE — Telephone Encounter (Signed)
Please review

## 2020-09-09 NOTE — Telephone Encounter (Signed)
She obviously has not been on duloxetine long enough for it to have a full effect so it needs a longer trial.  She can always consider ECT as we have discussed before if she feels like she cannot wait for this medicine to help her.  Regarding the crying spells she could get potentially some benefit from [place].  We have samples and she could come pick them up to see if it helps her.  It may help.  It may or may not help depression but it often will help crying spells regardless of cause.  Regarding sleep make sure she is taking the duloxetine in the morning.  She is already on Ambien and Ativan.  How is she taking the Ativan during the course of the day?  She is had a previous trial of trazodone.  The most effective thing I could give her for sleep would be low-dose quetiapine 25 mg 1-2 nightly.  It has no mood affect nor side antipsychotic effect at that dose because the dose is so low but it typically is the most effective thing for insomnia.  If she agrees you can give her a prescription with the quantity of 60.  There have been several med changes since she was here my understanding if she is not taking [place].  Please verify whether this is the case or not I just want the record to accurately reflect what she is taking.

## 2020-09-10 ENCOUNTER — Telehealth: Payer: Self-pay | Admitting: Psychiatry

## 2020-09-10 NOTE — Telephone Encounter (Signed)
Rtc to patient yesterday afternoon, we spoke at length. She had uncontrollable crying episodes off and on for an hour. She has been at the beach with her family for a week and on Saturday she came home to an empty house. This was the first beach trip without her husband, it's also the 2 year anniversary of his death in 10-07-2023 .She has cried every day since then. She is so nervous and anxious being alone in the house. She has 2 friends that come and stay at times. She took her last dose of Zoloft on Saturday. She reports having trouble sleeping now that she is off that. She takes Ambien and gets usually 4-5 hours of sleep, will take an Ativan and go back to sleep. She reports feeling anxious, scared, and nervous being home alone. Tried to encourage with a hobby of some sort to take her mind off things, or an activity. She doesn't even know what she likes because she always did what her husband did. I advised her that no matter what medication she is taking it's not going to take this away until she is motivated enough to change her day to day life at home. 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 Says when she's there she isn't as anxious. She said recently with the shortage of nurses that they are pulling them from their normal position to another one that is short staffed. She is very panicky about this and her supervisor advised her to seek an accomodation from Dr. Clovis Pu to help if this should come up. Patient did have [employer] fax [place] this morning the paperwork to complete and this will be completed as soon as we can. She's asking that it say she is not to be reassigned or pulled to another unit/location due to her severe anxiety and uncontrolled crying. Also requesting if at any time her anxiety, severe depression, and crying episodes flare up she can leave work or miss work 3-4 times a month at least. She wasn't sure. She does enjoy going to work and it takes her mind off being alone, so it's  good for her. I also encouraged more counseling, she needs to find a grief support, perhaps something with her church or anywhere that provide that. She can use EAP with being a [employer] Employee so that's also an option.   We also discussed her sleep, I sent a Rx for Trazodone 50 mg 1-2 at hs. She said she will try 1/2 tablet first and go from there. Informed her if she needs to take her Ambien as well she can. We also discussed her uncontrollable crying episodes, I encouraged her to try Nudexta. Informed her we have samples and to come pick up after work today. She agreed. Advised her this is a temporary medication to help her crying. She agreed. Lots of reassurance and encouragement given, patient very appreciative.

## 2020-09-10 NOTE — Telephone Encounter (Signed)
Error

## 2020-09-14 ENCOUNTER — Telehealth: Payer: Self-pay | Admitting: Psychiatry

## 2020-09-14 NOTE — Telephone Encounter (Signed)
Received by fax FMLA from [employer] on [name] [name], DOB 04-03-2056

## 2020-09-15 ENCOUNTER — Other Ambulatory Visit: Payer: Self-pay | Admitting: Psychiatry

## 2020-09-15 DIAGNOSIS — F4001 Agoraphobia with panic disorder: Secondary | ICD-10-CM

## 2020-09-15 DIAGNOSIS — Z0289 Encounter for other administrative examinations: Secondary | ICD-10-CM

## 2020-09-15 NOTE — Telephone Encounter (Signed)
Reviewed thank you 

## 2020-09-15 NOTE — Telephone Encounter (Signed)
Forms signed and faxed to [employer]

## 2020-09-15 NOTE — Telephone Encounter (Signed)
Forms completed and given to Dr. Clovis Pu for review and signature

## 2020-09-16 DIAGNOSIS — Z634 Disappearance and death of family member: Secondary | ICD-10-CM | POA: Diagnosis not present

## 2020-09-16 DIAGNOSIS — F331 Major depressive disorder, recurrent, moderate: Secondary | ICD-10-CM | POA: Diagnosis not present

## 2020-09-16 DIAGNOSIS — F411 Generalized anxiety disorder: Secondary | ICD-10-CM | POA: Diagnosis not present

## 2020-09-16 MED FILL — DULoxetine HCL 30 MG CPEP: 30 | 30 days supply | Qty: 90 | Fill #1

## 2020-09-17 ENCOUNTER — Other Ambulatory Visit (HOSPITAL_COMMUNITY): Payer: Self-pay | Admitting: Psychiatry

## 2020-09-17 MED FILL — ZOLPIDEM TARTRATE 10 MG TAB: 10 | 30 days supply | Qty: 30 | Fill #1

## 2020-09-17 MED FILL — LORazepam 1 MG TABS: 1 | 15 days supply | Qty: 60 | Fill #0

## 2020-09-22 ENCOUNTER — Other Ambulatory Visit: Payer: Self-pay | Admitting: Psychiatry

## 2020-09-22 ENCOUNTER — Other Ambulatory Visit: Payer: Self-pay

## 2020-09-22 ENCOUNTER — Encounter: Payer: Self-pay | Admitting: Psychiatry

## 2020-09-22 ENCOUNTER — Ambulatory Visit (INDEPENDENT_AMBULATORY_CARE_PROVIDER_SITE_OTHER): Payer: 59 | Admitting: Psychiatry

## 2020-09-22 DIAGNOSIS — F5105 Insomnia due to other mental disorder: Secondary | ICD-10-CM | POA: Diagnosis not present

## 2020-09-22 DIAGNOSIS — F332 Major depressive disorder, recurrent severe without psychotic features: Secondary | ICD-10-CM

## 2020-09-22 DIAGNOSIS — F4321 Adjustment disorder with depressed mood: Secondary | ICD-10-CM | POA: Diagnosis not present

## 2020-09-22 DIAGNOSIS — F411 Generalized anxiety disorder: Secondary | ICD-10-CM

## 2020-09-22 DIAGNOSIS — F4001 Agoraphobia with panic disorder: Secondary | ICD-10-CM

## 2020-09-22 DIAGNOSIS — F482 Pseudobulbar affect: Secondary | ICD-10-CM

## 2020-09-22 MED ORDER — NUEDEXTA 20-10 MG PO CAPS: 1.0000 | ORAL_CAPSULE | Freq: Two times a day (BID) | ORAL | 2 refills | Status: DC

## 2020-09-22 NOTE — Progress Notes (Signed)
[name] [name] [id number] 28-Dec-1955 64 y.o.  Subjective:   Patient ID:  [name] [name] is a 64 y.o. (DOB [birth year]) female.  Chief Complaint:  Chief Complaint  Patient presents with  . Follow-up  . Depression  . Anxiety    Depression        Associated symptoms include fatigue and myalgias.  Associated symptoms include no decreased concentration and no suicidal ideas. Medication Refill Associated symptoms include fatigue and myalgias. Pertinent negatives include no chest pain or weakness.   [name] [name] presents to the office today for follow-up of anxiety.  seen April 2020.  She was dealing with grief and depression and sertraline was increased back to 200 mg daily.  [name] [name] died in 31-Oct-2018  from cancer.  Married 41 years.  Is able to focus on positive memories.  Not ready to move.  seen August 2020.  No meds were changed.  She continued sertraline 200 mg daily.  seen January 2020.  Sertraline was unchanged.  She was having still grief issues related to the loss of her husband.  She was also having problems with weight attributed to the sertraline.  She was given a trial of topiramate to try to help offset some of the weight gain plus it can have some potential antianxiety effects.  seen 02/19/20 stating: Very depressed. So sad.  Don't want to get OOB.  Got lost coming here and late.  Worse for 3-4 weeks.  Days off are terrible.  Barely able to function at work.  Cry all the way home after work.  So lonely.  Kids aren't coming like they were bc they are busy.  Need to figure out her own life.  Life was centered around H and kids.  Denies suicidal thoughts  Made following med changes: Increase sertraline back to 300 mg daily.  She's aware it's higher than the expected usual dose. Rexulti 2 mg daily Topiramate increase to target 50 mg BID and possibly higher for obesity.  03/18/2020 appointment the following is noted: No meds were changed. She didn't increase  sertraline bc fear of weight gain.  Quite a bit different.  No crying.  Sad a few times.  Better in 5 days.  Feels much less depressed.  Better energy, interest, enjoyment.  Also weather helping and started planting.  More motivation.  Desperate to lose weight.  Has some chronic pain and taking gabapentin.   Started gym cycle and lost some weight.   Worried over D with depression and conflict with pt.  D was always closer to father than her.  D distanced from her and recent bad outburst with her.  D recently blocked communication with her.   Disc getting her help.  05/19/2020 appointment with the following noted: Tried to self medicate bc felt good on Rexulti and tried reducing sertraline to 150 for 2 weeks and got worse.  So increased back to 100 mg BID. Stopped cable and got too bored for 3 mos.  Restarted cable yesterday.   Still grieving.  Kids want her to sell the house but she can't. Anxiety in the morning fidgety.  Taken more Ativan lately with more panic and worry over the future.  Almost like too much caffeine before any.  Limits it. Better if busy. Does better with working.  Was too sleepy with progesterone. Successful losing weight on the topiramate. Assessment/Plan: Her panic disorder is generally controlled but more anxious somewhat..She is still dealing with a lot of grief with  waves of depression and anxiety and at times easily overwhelmed but functional.   trial Rexulti 3 mg daily for 2 weeks to see if can gain more benefit for depression and anxiety. If no benefit then reduce Rexulti back to 2 mg.  She'll call  06/01/2020 phone call reporting that 3 mg Rexulti seem to be helping and she wanted to continue it. 06/03/2020, 2 days later she called back stating it was making her too sleepy.  She was planning a trip and she was encouraged to stop the medicine until she returns from her trip and we would reevaluate. 06/11/2020 phone call stating after stopping the Rexulti she was said,  depressed and crying all the time and wanted to return to Rexulti at 1 mg daily to which that was agreed. 07/02/2020 she called back wanting to milligram tablets of Rexulti because she had increased it on her own to that dosage and felt better.  She also had stopped drinking wine in the evening which she felt like helped.  So at her request a prescription was submitted for 2 mg Rexulti to the pharmacy. 07/16/2020 phone call stating she was very lonely and wanted to go back up to 3 mg of Rexulti.  That request was refused because of her previous side effects and making frequent changes with a med with a very long half-life like Rexulti is not a good idea.  07/21/2020 appointment with the following noted: Pretty desperate to do something. She increased Rexulti AMA to 3 mg despite being asked not to do so. Still sleepy and anxious.  Stopped wine for several weeks Ativan 1 -2 mg daily at work bc very anxious. Yesterday when son was there she wasn't sleepy or depressed until he left. Denies drowsiness when driving. Feels no better with grief. Plan: Stop Rexulti DT sleepiness.   Start Latuda 20 mg daily but she refuses bc doesn't think she can eat enough with it.  Then says she'll try it.  Topiramate She would like to increase further; OK increase to 75 mg BID.  No therapy since husband died. Needs counseling desperately.  She commits.  09/22/2020 appointment with the following noted Multiple phone calls since the last visit in crisis. Tried Latuda up to 40 mg a day with 3 mg Ativan daily.  She felt like it made her worse and stopped it. Recommended haloperidol 2 mg tablets 1-2 nightly due to extreme unmanageable anxiety and fearfulness.   08/18/2020 telephone call with the following noted:Tried haloperidol 1 mg and didn't help anything.  No effect except a little sleepiness.  Took it sparingly bc afraid of it.  Afraid of tremors and TD.  I didn't to give it a good chance. Strongly rec ECT.  Currently  will consider and agrees to go to [facility] for consult.  I'm scared.  Scared of meds too. Currently on sertraline 200 mg daily. Plan: Reduce sertraline to 150 daily and start duloxetine 30 mg capsule daily for 5 days, Then reduce sertraline to 100 mg and increase duloxetine to 2 capsules daily for 5 days, Then reduce sertraline to 1/2 of 100 mg aily and increase duloxetine to 90 mg daily for 5 days then stop sertraline and continue duloxetine 90 mg daily. Disc SE.  09/09/2020 extended phone call in crisis with nurse.  She was still transitioning from sertraline to duloxetine and trazodone was added for sleep. She's refusing ECT consult with [facility] and didn't go. She has found a new therapist and seen her several  times and she's local.  She is hopeful it might help. On duloxetine 90 mg since about 09/02/20. Never took buspirone but did receive it. Takes Ativan. Taking topiramate 75 mg in AM and on 50 mg PM.  Never increased to 75 mg BID. Takes Ambien initially and trazodone 25 mg HS and sleep not as good with duloxetine vs Zoloft. 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  Before that was sobbing and often not staying with herself. FMLA filled out for accomodation for leave if crying spells are unmanageable. Used to call H when she got in the car and son offered to have her call but he's less available.  So often cries in the car but onlly one bad crying spell after Nudexta. Hx migraine tx by neurologist who said MRI showed spots suggesting head injury.  Pt never remembers history of head injury. 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 Tomorrow is her and H's birthday and having family party tonight. Anniversary of 2 years is 10/25 and doing better about staying alone in her house. Uncertain mood effect from change to duloxetine so far but thinks maybe she's a little better. Doesn't tolerate being alone very well.  Worry about winter.  Past Psychiatric  Medication Trials:  Zoloft 300, Lexapro,, paroxetine 80 in 2017 Paliperidone, olanzapine, aripiprazole 20 mg, buspirone, Rexulti 3 mg sleepy clonidine, topiramate, gabapentin, metformin, phentermine, trazodone, Ambien Ativan,  Remote history topiramate for migraine 1998, Belviq helped lose 85#  Review of Systems:  Review of Systems  Constitutional: Positive for fatigue. Negative for unexpected weight change.       Sweating  Cardiovascular: Negative for chest pain.  Gastrointestinal: Negative for rectal pain.  Genitourinary: Negative for pelvic pain.  Musculoskeletal: Positive for myalgias.  Neurological: Negative for tremors and weakness.  Psychiatric/Behavioral: Positive for depression and dysphoric mood. Negative for agitation, behavioral problems, confusion, decreased concentration, hallucinations, self-injury, sleep disturbance and suicidal ideas. The patient is nervous/anxious. The patient is not hyperactive.     Medications: I have reviewed the patient's current medications.  Current Outpatient Medications  Medication Sig Dispense Refill  . albuterol (PROAIR HFA) 108 (90 Base) MCG/ACT inhaler Inhale 2 puffs into the lungs every 6 (six) hours as needed for wheezing or shortness of breath. Use as needed only  if your can't catch your breath 18 g 3  . amLODipine (NORVASC) 5 MG tablet Take 1 tablet (5 mg total) by mouth daily. 90 tablet 0  . B Complex Vitamins (B-COMPLEX/B-12) LIQD Place 5 mLs under the tongue daily at 2 PM daily at 2 PM.    . Cholecalciferol (VITAMIN D) 125 MCG (5000 UT) CAPS Take 5,000 Units by mouth daily.     Marland Kitchen Dextromethorphan-quiNIDine (NUEDEXTA) 20-10 MG capsule Take 1 capsule by mouth 2 (two) times daily. 60 capsule 2  . DULoxetine (CYMBALTA) 30 MG capsule 1  Daily for 5 days then 2 daily for 5 days then 3 daily (Patient taking differently: Take 90 mg by mouth daily. ) 90 capsule 1  . estradiol (ESTRACE) 0.5 MG tablet Take 1 tablet (0.5 mg total) by mouth  daily. 30 tablet 3  . fexofenadine (ALLEGRA) 180 MG tablet Take 180 mg by mouth daily.    Marland Kitchen gabapentin (NEURONTIN) 800 MG tablet Take 1 tablet (800 mg total) by mouth 4 (four) times daily. 120 tablet 1  . hydrochlorothiazide (MICROZIDE) 12.5 MG capsule Take 1 capsule (12.5 mg total) by mouth daily. 90 capsule 0  . LORazepam (ATIVAN) 1 MG  tablet TAKE 1 TABLET BY MOUTH EVERY 6 HOURS AS NEEDED 60 tablet 1  . mometasone (NASONEX) 50 MCG/ACT nasal spray Place 2 sprays into the nose daily. 17 g 3  . montelukast (SINGULAIR) 10 MG tablet Take 1 tablet (10 mg total) by mouth daily. 90 tablet 0  . nebivolol (BYSTOLIC) 10 MG tablet Take 1 tablet (10 mg total) by mouth 2 (two) times daily. 180 tablet 0  . NP THYROID 60 MG tablet TAKE 2 TABLETS BY MOUTH ONCE A DAY 60 tablet 1  . pantoprazole (PROTONIX) 40 MG tablet Take 1 tablet (40 mg total) by mouth 2 (two) times daily. 60 tablet 2  . progesterone (PROMETRIUM) 100 MG capsule Take 1 capsule (100 mg total) by mouth daily. 90 capsule 0  . topiramate (TOPAMAX) 25 MG tablet TAKE 3 TABLETS BY MOUTH TWICE DAILY 180 tablet 1  . Travoprost, BAK Free, (TRAVATAN) 0.004 % SOLN ophthalmic solution Place 1 drop into both eyes at bedtime.    . traZODone (DESYREL) 50 MG tablet Take 1 tablet (50 mg total) by mouth at bedtime. 30 tablet 0  . zolpidem (AMBIEN) 10 MG tablet TAKE 1/2 TO 1 TABLET BY MOUTH AT BEDTIME AS NEEDED 30 tablet 5  . busPIRone (BUSPAR) 7.5 MG tablet Take 1 tablet (7.5 mg total) by mouth 3 (three) times daily. (Patient not taking: Reported on 09/22/2020) 90 tablet 1   No current facility-administered medications for this visit.    Medication Side Effects: Other: ? sweating related.  Allergies:  Allergies  Allergen Reactions  . Levaquin [Levofloxacin] Other (See Comments)    MUSCLE PAIN  . Nickel Rash    Past Medical History:  Diagnosis Date  . Allergy   . Anxiety   . Bronchiectasis   . Bronchitis    chronic  . 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 COPD (chronic obstructive pulmonary disease) ([place])   . Dental crown present    upper  . Depression   . Environmental allergies    receives allergy shots  . GERD (gastroesophageal reflux disease)   . 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 - no current med.  . Glucose intolerance (impaired glucose tolerance)    on Metformin  . H/O hiatal hernia    "sliding"  . History of echocardiogram    Echo 1/16: EF 60-65, no RWMA, Gr 1 DD, mild LAE, trivial pericardial eff post to heart  . History of echocardiogram    Echo 11/17: EF 55-60, no RWMA, Gr 1 DD, normal RV function.  Marland Kitchen History of palpitations    Holter 12/15: NSR, PACs  . Hx of migraines   . Hypothyroidism   . Osteoarthritis    knees  . Positional headache since 1997   if lies on left side    Family History  Problem Relation Age of Onset  . Lung cancer Father 76       dies age 4  . Emphysema Father   . COPD Brother 67       died age 88  . Stroke Mother   . Hypertension Sister   . Thyroid disease Sister        Graves Disease    Social History   Socioeconomic History  . Marital status: Widowed    Spouse name: Not on file  . Number of children: Not on file  . Years of education: Not on file  . Highest education level: Not on file  Occupational History  .  Occupation: Land    Employer: [employer]  Tobacco Use  . Smoking status: Never Smoker  . Smokeless tobacco: Never Used  Vaping Use  . Vaping Use: Never used  Substance and Sexual Activity  . Alcohol use: Yes    Alcohol/week: 4.0 standard drinks    Types: 4 Glasses of wine per week  . Drug use: No  . Sexual activity: Yes    Partners: Male    Birth control/protection: Post-menopausal  Other Topics Concern  . Not on file  Social History Narrative   Widowed late 2019 after 40+ years of marriage   3 sons one daughter   Working as a Company secretary for [employer]   No/never tobacco no drug use 1 caffeinated  beverage daily, occasional or rare glass of wine   Social Determinants of Health   Financial Resource Strain:   . Difficulty of Paying Living Expenses: Not on file  Food Insecurity:   . Worried About [name] in the Last Year: Not on file  . Ran Out of Food in the Last Year: Not on file  Transportation Needs:   . Lack of Transportation (Medical): Not on file  . Lack of Transportation (Non-Medical): Not on file  Physical Activity:   . Days of Exercise per Week: Not on file  . Minutes of Exercise per Session: Not on file  Stress:   . Feeling of Stress : Not on file  Social Connections:   . Frequency of Communication with Friends and Family: Not on file  . Frequency of Social Gatherings with Friends and Family: Not on file  . Attends Religious Services: Not on file  . Active Member of Clubs or Organizations: Not on file  . Attends Archivist Meetings: Not on file  . Marital Status: Not on file  Intimate Partner Violence:   . Fear of Current or Ex-Partner: Not on file  . Emotionally Abused: Not on file  . Physically Abused: Not on file  . Sexually Abused: Not on file    Past Medical History, Surgical history, Social history, and Family history were reviewed and updated as appropriate.   Please see review of systems for further details on the patient's review from today.   Objective:   Physical Exam:  LMP 12/12/2005   Physical Exam Constitutional:      General: She is not in acute distress.    Appearance: She is well-developed. She is obese.  Musculoskeletal:        General: No deformity.  Neurological:     Mental Status: She is alert and oriented to person, place, and time.     Cranial Nerves: No dysarthria.     Coordination: Coordination normal.  Psychiatric:        Attention and Perception: Perception normal. She is attentive. She does not perceive auditory or visual hallucinations.        Mood and Affect: Mood is anxious and depressed. Affect is  not labile, blunt, angry, tearful or inappropriate.        Speech: Speech normal. Speech is not slurred.        Behavior: Behavior normal. Behavior is not slowed. Behavior is cooperative.        Thought Content: Thought content normal. Thought content is not paranoid or delusional. Thought content does not include homicidal or suicidal ideation. Thought content does not include homicidal or suicidal plan.        Cognition and Memory: Cognition and  memory normal.     Comments: Affect labile Fair insight and judgment   Gained 100# while H sick.  Lab Review:     Component Value Date/Time   NA 138 08/27/2020 1126   K 3.4 (L) 08/27/2020 1126   CL 98 08/27/2020 1126   CO2 29 08/27/2020 1126   GLUCOSE 119 (H) 08/27/2020 1126   BUN 19 08/27/2020 1126   CREATININE 0.93 08/27/2020 1126   CALCIUM 9.7 08/27/2020 1126   PROT 6.9 08/27/2020 1126   ALBUMIN 4.4 02/05/2015 0850   AST 17 08/27/2020 1126   ALT 25 08/27/2020 1126   ALKPHOS 53 02/05/2015 0850   BILITOT 0.5 08/27/2020 1126   GFRNONAA 65 08/27/2020 1126   GFRAA 75 08/27/2020 1126       Component Value Date/Time   WBC 8.5 08/27/2020 1126   RBC 4.60 08/27/2020 1126   HGB 14.4 08/27/2020 1126   HCT 42.6 08/27/2020 1126   PLT 293 08/27/2020 1126   MCV 92.6 08/27/2020 1126   MCH 31.3 08/27/2020 1126   MCHC 33.8 08/27/2020 1126   RDW 12.5 08/27/2020 1126   LYMPHSABS 1,148 08/27/2020 1126   MONOABS 0.6 03/23/2018 1244   EOSABS 34 08/27/2020 1126   BASOSABS 17 08/27/2020 1126    No results found for: POCLITH, LITHIUM   No results found for: PHENYTOIN, PHENOBARB, VALPROATE, CBMZ   .res Assessment: Plan:    Leondra was seen today for follow-up, depression and anxiety.  Diagnoses and all orders for this visit:  Severe episode of recurrent major depressive disorder, without psychotic features ([place])  Panic disorder with agoraphobia  Generalized anxiety disorder  Complicated grief  Insomnia due to mental  condition  Pseudobulbar affect -     Dextromethorphan-quiNIDine (NUEDEXTA) 20-10 MG capsule; Take 1 capsule by mouth 2 (two) times daily.     Supportive therapy dealing with D Mary Tate's mental health problems.  Answered questions about trying to get her help.  Greater than 50% of 30 minface to face time with patient was spent on counseling and coordination of care. We discussed Disc the difference between depression and grief.  Her H would not talk about his cancer and wouldn't prepare her for his death. Supportive and grief work.  Rec therapeutic letter writing as she is still having a problem. Her panic disorder is generally controlled but more anxious somewhat.. She is still dealing with a lot of grief with waves of depression and anxiety and at times easily overwhelmed but functional.  Disc importance of staying busy to help with depression and anxiety  Nuedexta markedly helpful for crying but hasn't resolved it.  Duloxetine 90 on 3 weeks.   Needs more time.   Discussed the possibility this could be contributing to weight gain.  Realistically the only alternative would be fluoxetine and it is not as good of an anxiety med.  Her anxiety has been severe in the past.  I would advise against changing or reducing the dose at this time because she has recently had a little more depression and anxiety than baseline.  She is not using the lorazepam excessively.  She is taking it appropriately.  The diazepam is prescribed vaginally by her gynecologist for chronic pelvic pain and not being used. We discussed the short-term risks associated with benzodiazepines including sedation and increased fall risk among others.  Discussed long-term side effect risk including dependence, potential withdrawal symptoms, and the potential eventual dose-related risk of dementia.  Disc the risk of Ambien amnesia.  Prefer avoid stimulant weight loss meds per PCP.  Naltrexone vs topiramate.  She tolerated topiramate  for a while in the past for migraine.  Topiramate She would like to increase further; OK increase to 75 mg BID.   To help weight and anxiety off label. She has been successful losing weight on this.  It also is used for HA.   Naltrexone or metformin or Ozempic  are other options.  Pain is better but not gone and in therapy.  Started Needed counseling.  She commits.  This appt was 30 mins.  FU 4-5 weeks  Lynder Parents, MD, DFAPA  Please see After Visit Summary for patient specific instructions.    Future Appointments  Date Time Provider [place]  10/13/2020 11:15 AM Doree Albee, MD [place] None  10/26/2020  4:00 PM Cottle, Billey Co., MD [facility] None    No orders of the defined types were placed in this encounter.     -------------------------------

## 2020-09-23 DIAGNOSIS — F331 Major depressive disorder, recurrent, moderate: Secondary | ICD-10-CM | POA: Diagnosis not present

## 2020-09-23 DIAGNOSIS — F411 Generalized anxiety disorder: Secondary | ICD-10-CM | POA: Diagnosis not present

## 2020-09-23 DIAGNOSIS — Z634 Disappearance and death of family member: Secondary | ICD-10-CM | POA: Diagnosis not present

## 2020-09-25 ENCOUNTER — Other Ambulatory Visit (INDEPENDENT_AMBULATORY_CARE_PROVIDER_SITE_OTHER): Payer: Self-pay | Admitting: Internal Medicine

## 2020-09-25 ENCOUNTER — Telehealth: Payer: Self-pay | Admitting: Psychiatry

## 2020-09-25 MED FILL — PANTOPRAZOLE SOD DR 40 MG T: 40 | 30 days supply | Qty: 60 | Fill #2

## 2020-09-25 MED FILL — NP THYROID 60 MG TABLET: 60 | 30 days supply | Qty: 60 | Fill #1

## 2020-09-25 NOTE — Telephone Encounter (Signed)
Patient's prior authorization for Nudexta has been submitted to [place]. Pending response at this time.

## 2020-09-25 NOTE — Telephone Encounter (Signed)
Rtc to Nudexta, just informing us they initiated her PA through Cover my meds and they sent a fax over. He did go ahead and give me the information to complete her PA.

## 2020-09-25 NOTE — Telephone Encounter (Signed)
[name] [name] a case Freight forwarder at [employer] called and said that she has a question about the PA on this medication . Please give her a call at [phone]

## 2020-09-25 NOTE — Telephone Encounter (Signed)
Her prior authorization was approved for [employer]

## 2020-09-28 ENCOUNTER — Telehealth: Payer: Self-pay

## 2020-09-28 ENCOUNTER — Other Ambulatory Visit (INDEPENDENT_AMBULATORY_CARE_PROVIDER_SITE_OTHER): Payer: Self-pay | Admitting: Internal Medicine

## 2020-09-28 ENCOUNTER — Telehealth (INDEPENDENT_AMBULATORY_CARE_PROVIDER_SITE_OTHER): Payer: Self-pay

## 2020-09-28 DIAGNOSIS — F331 Major depressive disorder, recurrent, moderate: Secondary | ICD-10-CM | POA: Diagnosis not present

## 2020-09-28 DIAGNOSIS — Z634 Disappearance and death of family member: Secondary | ICD-10-CM | POA: Diagnosis not present

## 2020-09-28 DIAGNOSIS — F411 Generalized anxiety disorder: Secondary | ICD-10-CM | POA: Diagnosis not present

## 2020-09-28 MED FILL — ESTRADIOL 0.5 MG TABS: 0.5 | 30 days supply | Qty: 30 | Fill #0

## 2020-09-28 MED FILL — NUEDEXTA 20-10 MG CAPSULE: 20-10 | 30 days supply | Qty: 60 | Fill #0

## 2020-09-28 NOTE — Telephone Encounter (Signed)
Pt called very tearful, sad this morning. She stated she tried to call all week But could not get a answer.Not sure what line she pushed for help. [facility]Gweal gave to [facility]:  progestogen cream 20 mg per mL. ;70ml topically qhs. . She said she could not take the pills. She ran into Dr Helane Rima in hospital . Ans she gave her this type to see if we would call in for her.   Ask will Dr Anastasio Champion change to this try for now until she come in on her next visit?

## 2020-09-28 NOTE — Telephone Encounter (Signed)
Pt ws called and given instructions. Pt is now at her therapist office to be seen. Sounds better now when called. She will write down her reactions and things to discuss when she come on up coming visit in Nov.

## 2020-09-28 NOTE — Telephone Encounter (Signed)
Prior authorization submitted and approved for NUDEXTA 20-10 MG CAPSULES with [employer] effective 09/25/2020-09/24/2021

## 2020-09-28 NOTE — Telephone Encounter (Signed)
Let the patient know that progesterone cream will actually not help her progesterone levels and therefore I do not recommend it.  We can discuss this when she comes in for her appointment in a couple of weeks time.

## 2020-09-30 ENCOUNTER — Other Ambulatory Visit (HOSPITAL_COMMUNITY): Payer: Self-pay | Admitting: Dermatology

## 2020-09-30 DIAGNOSIS — L710 Perioral dermatitis: Secondary | ICD-10-CM | POA: Diagnosis not present

## 2020-09-30 DIAGNOSIS — D225 Melanocytic nevi of trunk: Secondary | ICD-10-CM | POA: Diagnosis not present

## 2020-09-30 DIAGNOSIS — L821 Other seborrheic keratosis: Secondary | ICD-10-CM | POA: Diagnosis not present

## 2020-09-30 MED FILL — metroNIDAZOLE 0.75 % CREA: 0.75 | 30 days supply | Qty: 45 | Fill #0

## 2020-10-05 ENCOUNTER — Telehealth: Payer: Self-pay

## 2020-10-05 DIAGNOSIS — Z0289 Encounter for other administrative examinations: Secondary | ICD-10-CM

## 2020-10-05 NOTE — Telephone Encounter (Signed)
Accommodation Form completed for [employer], given to Dr. Clovis Pu to review and sign.

## 2020-10-06 MED FILL — TOPIRAMATE 25 MG TABLET: 25 | 30 days supply | Qty: 180 | Fill #1

## 2020-10-06 MED FILL — NEBIVOLOL HCL TAB 10 MG: 10 | 30 days supply | Qty: 60 | Fill #5

## 2020-10-07 DIAGNOSIS — Z634 Disappearance and death of family member: Secondary | ICD-10-CM | POA: Diagnosis not present

## 2020-10-07 DIAGNOSIS — F331 Major depressive disorder, recurrent, moderate: Secondary | ICD-10-CM | POA: Diagnosis not present

## 2020-10-07 DIAGNOSIS — F411 Generalized anxiety disorder: Secondary | ICD-10-CM | POA: Diagnosis not present

## 2020-10-12 MED FILL — DULoxetine HCL 30 MG CPEP: 30 | 5 days supply | Qty: 15 | Fill #2

## 2020-10-13 ENCOUNTER — Ambulatory Visit (INDEPENDENT_AMBULATORY_CARE_PROVIDER_SITE_OTHER): Payer: 59 | Admitting: Internal Medicine

## 2020-10-13 ENCOUNTER — Encounter (INDEPENDENT_AMBULATORY_CARE_PROVIDER_SITE_OTHER): Payer: Self-pay | Admitting: Internal Medicine

## 2020-10-13 ENCOUNTER — Other Ambulatory Visit: Payer: Self-pay

## 2020-10-13 ENCOUNTER — Other Ambulatory Visit: Payer: Self-pay | Admitting: Obstetrics and Gynecology

## 2020-10-13 DIAGNOSIS — Z1231 Encounter for screening mammogram for malignant neoplasm of breast: Secondary | ICD-10-CM

## 2020-10-13 DIAGNOSIS — F4329 Adjustment disorder with other symptoms: Secondary | ICD-10-CM | POA: Diagnosis not present

## 2020-10-13 DIAGNOSIS — E2839 Other primary ovarian failure: Secondary | ICD-10-CM

## 2020-10-13 DIAGNOSIS — E039 Hypothyroidism, unspecified: Secondary | ICD-10-CM

## 2020-10-13 DIAGNOSIS — F4381 Prolonged grief disorder: Secondary | ICD-10-CM

## 2020-10-13 NOTE — Progress Notes (Signed)
Metrics: Intervention Frequency ACO  Documented Smoking Status Yearly  Screened one or more times in 24 months  Cessation Counseling or  Active cessation medication Past 24 months  Past 24 months   Guideline developer: UpToDate (See UpToDate for funding source) Date Released: 2014       Wellness Office Visit  Subjective:  Patient ID: [name] [name], female    DOB: May 24, 1956  Age: 64 y.o. MRN: [id number]  CC: This lady was scheduled for an annual physical exam but this was unable to be done as she had more things to discuss regarding her grieving. HPI  She remains extremely tearful and this is affecting her job apparently. Judson Roch had told her to take progesterone along with estradiol and never take estradiol by itself but unfortunately she has not followed this.  She continues on NP thyroid. Her psychiatrist did start her on a new medication for depression apparently.  She thinks it may be helping her to some degree. Past Medical History:  Diagnosis Date  . Allergy   . Anxiety   . Bronchiectasis   . Bronchitis    chronic  . 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 COPD (chronic obstructive pulmonary disease) ([place])   . Dental crown present    upper  . Depression   . Environmental allergies    receives allergy shots  . GERD (gastroesophageal reflux disease)   . 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 - no current med.  . Glucose intolerance (impaired glucose tolerance)    on Metformin  . H/O hiatal hernia    "sliding"  . History of echocardiogram    Echo 1/16: EF 60-65, no RWMA, Gr 1 DD, mild LAE, trivial pericardial eff post to heart  . History of echocardiogram    Echo 11/17: EF 55-60, no RWMA, Gr 1 DD, normal RV function.  Marland Kitchen History of palpitations    Hol[place] 12/15: NSR, PACs  . Hx of migraines   . Hypothyroidism   . Osteoarthritis    knees  . Positional headache since 1997   if lies on left side   Past Surgical History:  Procedure Laterality Date  .  CHOLECYSTECTOMY  04/21/2011  . CHONDROPLASTY  10/19/2012   Procedure: CHONDROPLASTY;  Surgeon: Yvette Rack., MD;  Location: [facility];  Service: Orthopedics;  Laterality: Right;  . COLONOSCOPY    . KNEE ARTHROSCOPY  10/19/2012   Procedure: ARTHROSCOPY KNEE;  Surgeon: Yvette Rack., MD;  Location: [facility];  Service: Orthopedics;  Laterality: Right;  . KNEE ARTHROSCOPY Right 02/01/2013   Procedure: RIGHT KNEE ARTHROSCOPY WITH MEDIAL MENISCECTOMY, DEBRIDEMENT CHONDROMALACIA PATELLA;  Surgeon: Yvette Rack., MD;  Location: [facility];  Service: Orthopedics;  Laterality: Right;  RIGHT KNEE ARTHROSCOPY WITH MEDIAL MENISCECTOMY, DEBRIDEMENT CHONDROMALACIA PATELLA  . KNEE ARTHROSCOPY Left 03/15/2013   Procedure: LEFT KNEE ARTHROSCOPY WITH DEBRIDEMENT/SHAVING CHONDROPLASTY WITH MEDIAL MENISECTOMY;  Surgeon: Yvette Rack., MD;  Location: [facility];  Service: Orthopedics;  Laterality: Left;  . KNEE ARTHROSCOPY WITH LATERAL MENISECTOMY  10/19/2012   Procedure: KNEE ARTHROSCOPY WITH LATERAL MENISECTOMY;  Surgeon: Yvette Rack., MD;  Location: [facility];  Service: Orthopedics;  Laterality: Right;  . TONSILLECTOMY AND ADENOIDECTOMY     age 45  . TUBAL LIGATION  1986  . WISDOM TOOTH EXTRACTION       Family History  Problem Relation Age of Onset  .  Lung cancer Father 59       dies age 34  . Emphysema Father   . COPD Brother 103       died age 23  . Stroke Mother   . Hypertension Sister   . Thyroid disease Sister        Graves Disease    Social History   Social History Narrative   Widowed late 2019 after 40+ years of marriage   3 sons one daughter   Working as a Company secretary for [employer]   No/never tobacco no drug use 1 caffeinated beverage daily, occasional or rare glass of wine   Social History   Tobacco Use  . Smoking status: Never Smoker  . Smokeless tobacco: Never Used  Substance Use  Topics  . Alcohol use: Yes    Alcohol/week: 4.0 standard drinks    Types: 4 Glasses of wine per week    Current Meds  Medication Sig  . albuterol (PROAIR HFA) 108 (90 Base) MCG/ACT inhaler Inhale 2 puffs into the lungs every 6 (six) hours as needed for wheezing or shortness of breath. Use as needed only  if your can't catch your breath  . amLODipine (NORVASC) 5 MG tablet Take 1 tablet (5 mg total) by mouth daily.  . B Complex Vitamins (B-COMPLEX/B-12) LIQD Place 5 mLs under the tongue daily at 2 PM daily at 2 PM.  . busPIRone (BUSPAR) 7.5 MG tablet Take 1 tablet (7.5 mg total) by mouth 3 (three) times daily.  . Cholecalciferol (VITAMIN D) 125 MCG (5000 UT) CAPS Take 5,000 Units by mouth daily.   Marland Kitchen Dextromethorphan-quiNIDine (NUEDEXTA) 20-10 MG capsule Take 1 capsule by mouth 2 (two) times daily.  . DULoxetine (CYMBALTA) 30 MG capsule 1  Daily for 5 days then 2 daily for 5 days then 3 daily (Patient taking differently: Take 90 mg by mouth daily. )  . estradiol (ESTRACE) 0.5 MG tablet TAKE 1 TABLET BY MOUTH ONCE DAILY  . fexofenadine (ALLEGRA) 180 MG tablet Take 180 mg by mouth daily.  Marland Kitchen gabapentin (NEURONTIN) 800 MG tablet Take 1 tablet (800 mg total) by mouth 4 (four) times daily.  . hydrochlorothiazide (MICROZIDE) 12.5 MG capsule Take 1 capsule (12.5 mg total) by mouth daily.  Marland Kitchen LORazepam (ATIVAN) 1 MG tablet TAKE 1 TABLET BY MOUTH EVERY 6 HOURS AS NEEDED  . mometasone (NASONEX) 50 MCG/ACT nasal spray Place 2 sprays into the nose daily.  . montelukast (SINGULAIR) 10 MG tablet Take 1 tablet (10 mg total) by mouth daily.  . nebivolol (BYSTOLIC) 10 MG tablet Take 1 tablet (10 mg total) by mouth 2 (two) times daily.  . NP THYROID 60 MG tablet TAKE 2 TABLETS BY MOUTH ONCE A DAY  . pantoprazole (PROTONIX) 40 MG tablet Take 1 tablet (40 mg total) by mouth 2 (two) times daily.  . progesterone (PROMETRIUM) 100 MG capsule Take 1 capsule (100 mg total) by mouth daily.  Marland Kitchen topiramate (TOPAMAX) 25 MG  tablet TAKE 3 TABLETS BY MOUTH TWICE DAILY  . Travoprost, BAK Free, (TRAVATAN) 0.004 % SOLN ophthalmic solution Place 1 drop into both eyes at bedtime.  . traZODone (DESYREL) 50 MG tablet Take 1 tablet (50 mg total) by mouth at bedtime.  Marland Kitchen zolpidem (AMBIEN) 10 MG tablet TAKE 1/2 TO 1 TABLET BY MOUTH AT BEDTIME AS NEEDED      Depression screen [facility] 2/9 10/13/2020 02/27/2018  Decreased Interest 3 0  Down, Depressed, Hopeless 3 0  PHQ - 2 Score  6 0  Altered sleeping 2 -  Tired, decreased energy 2 -  Change in appetite 1 -  Feeling bad or failure about yourself  2 -  Trouble concentrating 3 -  Moving slowly or fidgety/restless 3 -  Suicidal thoughts 0 -  PHQ-9 Score 19 -  Difficult doing work/chores Extremely dIfficult -  Some recent data might be hidden     Objective:   Today's Vitals: BP 133/70 (BP Location: Left Arm, Patient Position: Sitting, Cuff Size: Normal)   Pulse 71   Temp 97.6 F (36.4 C) (Temporal)   Resp 18   Ht 5\' 1"  (1.549 m)   Wt 182 lb (82.6 kg)   LMP 12/12/2005   SpO2 97%   BMI 34.39 kg/m  Vitals with BMI 10/13/2020 08/27/2020 01/01/2020  Height 5\' 1"  - 5\' 1"   Weight 182 lbs 192 lbs 239 lbs  BMI 33.35 - 45.62  Systolic 563 893 -  Diastolic 70 78 -  Pulse 71 73 -     Physical Exam  Extremely tearful for most of the visit.  Blood pressure in a good range.  She has lost 10 pounds according to our numbers.  Since the beginning of the year, she has actually lost almost 60 pounds.     Assessment   1. Morbid obesity ([place])   2. Hypothyroidism, unspecified type   3. Primary ovarian failure   4. Grief reaction with prolonged bereavement       Tests ordered No orders of the defined types were placed in this encounter.    Plan: 1. I have told her that she cannot take estradiol by itself without the use of progesterone as she will be at higher risk for uterine cancer.  I have told her to take estradiol 0.5 mg daily and I have called in sublingual  progesterone drops 100 mg [facility] to see if she will tolerate this without becoming very drowsy. 2. She will continue with the same dose of desiccated NP thyroid. 3. I will see her in the next several weeks when her schedule allows for an annual physical.  I spent 45 minutes with this patient going over her grief and gave some recommendations and also reiterated how estradiol and progesterone should be taken together.   No orders of the defined types were placed in this encounter.   Doree Albee, MD

## 2020-10-14 DIAGNOSIS — F411 Generalized anxiety disorder: Secondary | ICD-10-CM | POA: Diagnosis not present

## 2020-10-14 DIAGNOSIS — Z634 Disappearance and death of family member: Secondary | ICD-10-CM | POA: Diagnosis not present

## 2020-10-14 DIAGNOSIS — F331 Major depressive disorder, recurrent, moderate: Secondary | ICD-10-CM | POA: Diagnosis not present

## 2020-10-16 ENCOUNTER — Other Ambulatory Visit: Payer: Self-pay | Admitting: Psychiatry

## 2020-10-16 MED FILL — ZOLPIDEM TARTRATE 10 MG TAB: 10 | 30 days supply | Qty: 30 | Fill #2

## 2020-10-16 MED FILL — traZODone HCL 50 MG TABS: 50 | 90 days supply | Qty: 90 | Fill #0

## 2020-10-16 MED FILL — LORazepam 1 MG TABS: 1 | 15 days supply | Qty: 60 | Fill #1

## 2020-10-16 MED FILL — DULoxetine HCL 30 MG CPEP: 30 | 90 days supply | Qty: 270 | Fill #0

## 2020-10-20 ENCOUNTER — Other Ambulatory Visit: Payer: Self-pay | Admitting: Psychiatry

## 2020-10-23 ENCOUNTER — Other Ambulatory Visit: Payer: Self-pay

## 2020-10-23 DIAGNOSIS — F5105 Insomnia due to other mental disorder: Secondary | ICD-10-CM

## 2020-10-23 MED ORDER — TRAZODONE HCL 50 MG PO TABS: 50.0000 mg | ORAL_TABLET | Freq: Every day | ORAL | 0 refills | Status: DC

## 2020-10-26 ENCOUNTER — Ambulatory Visit (INDEPENDENT_AMBULATORY_CARE_PROVIDER_SITE_OTHER): Payer: 59 | Admitting: Psychiatry

## 2020-10-26 ENCOUNTER — Other Ambulatory Visit: Payer: Self-pay

## 2020-10-26 ENCOUNTER — Encounter: Payer: Self-pay | Admitting: Psychiatry

## 2020-10-26 DIAGNOSIS — F411 Generalized anxiety disorder: Secondary | ICD-10-CM | POA: Diagnosis not present

## 2020-10-26 DIAGNOSIS — F482 Pseudobulbar affect: Secondary | ICD-10-CM | POA: Diagnosis not present

## 2020-10-26 DIAGNOSIS — F4321 Adjustment disorder with depressed mood: Secondary | ICD-10-CM

## 2020-10-26 DIAGNOSIS — F332 Major depressive disorder, recurrent severe without psychotic features: Secondary | ICD-10-CM

## 2020-10-26 DIAGNOSIS — G4733 Obstructive sleep apnea (adult) (pediatric): Secondary | ICD-10-CM

## 2020-10-26 DIAGNOSIS — F5105 Insomnia due to other mental disorder: Secondary | ICD-10-CM

## 2020-10-26 DIAGNOSIS — F4001 Agoraphobia with panic disorder: Secondary | ICD-10-CM | POA: Diagnosis not present

## 2020-10-26 NOTE — Progress Notes (Signed)
[name] [name] [id number] 08-Nov-1956 64 y.o.  Subjective:   Patient ID:  [name] [name] is a 64 y.o. (DOB Jan 27, 1956) female.  Chief Complaint:  Chief Complaint  Patient presents with  . Follow-up  . Anxiety  . Depression    Depression        Associated symptoms include fatigue and myalgias.  Associated symptoms include no decreased concentration and no suicidal ideas. Medication Refill Associated symptoms include fatigue and myalgias. Pertinent negatives include no chest pain or weakness.   [name] [name] presents to the office today for follow-up of anxiety.  seen April 2020.  She was dealing with grief and depression and sertraline was increased back to 200 mg daily.  [name] [name] died in Oct 12, 2018  from cancer.  Married 41 years.  Is able to focus on positive memories.  Not ready to move.  seen August 2020.  No meds were changed.  She continued sertraline 200 mg daily.  seen January 2020.  Sertraline was unchanged.  She was having still grief issues related to the loss of [name] [name] husband.  She was also having problems with weight attributed to the sertraline.  She was given a trial of topiramate to try to help offset some of the weight gain plus it can have some potential antianxiety effects.  seen 02/19/20 stating: Very depressed. So sad.  Don't want to get OOB.  Got lost coming here and late.  Worse for 3-4 weeks.  Days off are terrible.  Barely able to function at work.  Cry all the way home after work.  So lonely.  [name] [name] aren't coming like they were bc they are busy.  Need to figure out [name] [name] own life.  Life was centered around [name] [name] and [name] [name].  Denies suicidal thoughts  Made following med changes: Increase sertraline back to 300 mg daily.  She's aware it's higher than the expected usual dose. Rexulti 2 mg daily Topiramate increase to target 50 mg BID and possibly higher for obesity.  03/18/2020 appointment the following is noted: No meds were changed. She didn't increase  sertraline bc fear of weight gain.  Quite a bit different.  No crying.  Sad a few times.  Better in 5 days.  Feels much less depressed.  Better energy, interest, enjoyment.  Also weather helping and started planting.  More motivation.  Desperate to lose weight.  Has some chronic pain and taking gabapentin.   Started gym cycle and lost some weight.   Worried over [name] [name] with depression and conflict with pt.  [name] [name] was always closer to [name] [name] than [name] [name].  [name] [name] distanced from [name] [name] and recent bad outburst with [name] [name].  [name] [name] recently blocked communication with [name] [name].   Disc getting [name] [name] help.  05/19/2020 appointment with the following noted: Tried to self medicate bc felt good on Rexulti and tried reducing sertraline to 150 for 2 weeks and got worse.  So increased back to 100 mg BID. Stopped cable and got too bored for 3 mos.  Restarted cable yesterday.   Still grieving.  [name] [name] want [name] [name] to sell the house but she can't. Anxiety in the morning fidgety.  Taken more Ativan lately with more panic and worry over the future.  Almost like too much caffeine before any.  Limits it. Better if busy. Does better with working.  Was too sleepy with progesterone. Successful losing weight on the topiramate. Assessment/Plan: [name] [name] panic disorder is generally controlled but more anxious somewhat..She is still dealing with a lot of grief with  waves of depression and anxiety and at times easily overwhelmed but functional.   trial Rexulti 3 mg daily for 2 weeks to see if can gain more benefit for depression and anxiety. If no benefit then reduce Rexulti back to 2 mg.  She'll call  06/01/2020 phone call reporting that 3 mg Rexulti seem to be helping and she wanted to continue it. 06/03/2020, 2 days later she called back stating it was making [name] [name] too sleepy.  She was planning a trip and she was encouraged to stop the medicine until she returns from [name] [name] trip and we would reevaluate. 06/11/2020 phone call stating after stopping the Rexulti she was said,  depressed and crying all the time and wanted to return to Rexulti at 1 mg daily to which that was agreed. 07/02/2020 she called back wanting to milligram tablets of Rexulti because she had increased it on [name] [name] own to that dosage and felt better.  She also had stopped drinking wine in the evening which she felt like helped.  So at [name] [name] request a prescription was submitted for 2 mg Rexulti to the pharmacy. 07/16/2020 phone call stating she was very lonely and wanted to go back up to 3 mg of Rexulti.  That request was refused because of [name] [name] previous side effects and making frequent changes with a med with a very long half-life like Rexulti is not a good idea.  07/21/2020 appointment with the following noted: Pretty desperate to do something. She increased Rexulti AMA to 3 mg despite being asked not to do so. Still sleepy and anxious.  Stopped wine for several weeks Ativan 1 -2 mg daily at work bc very anxious. Yesterday when [name] [name] was there she wasn't sleepy or depressed until he left. Denies drowsiness when driving. Feels no better with grief. Plan: Stop Rexulti DT sleepiness.   Start Latuda 20 mg daily but she refuses bc doesn't think she can eat enough with it.  Then says she'll try it.  Topiramate She would like to increase further; OK increase to 75 mg BID.  No therapy since husband died. Needs counseling desperately.  She commits.  09/22/2020 appointment with the following noted Multiple phone calls since the last visit in crisis. Tried Latuda up to 40 mg a day with 3 mg Ativan daily.  She felt like it made [name] [name] worse and stopped it. Recommended haloperidol 2 mg tablets 1-2 nightly due to extreme unmanageable anxiety and fearfulness.   08/18/2020 telephone call with the following noted:Tried haloperidol 1 mg and didn't help anything.  No effect except a little sleepiness.  Took it sparingly bc afraid of it.  Afraid of tremors and TD.  I didn't to give it a good chance. Strongly rec ECT.  Currently  will consider and agrees to go to [facility] for consult.  I'm scared.  Scared of meds too. Currently on sertraline 200 mg daily. Plan: Reduce sertraline to 150 daily and start duloxetine 30 mg capsule daily for 5 days, Then reduce sertraline to 100 mg and increase duloxetine to 2 capsules daily for 5 days, Then reduce sertraline to 1/2 of 100 mg aily and increase duloxetine to 90 mg daily for 5 days then stop sertraline and continue duloxetine 90 mg daily. Disc SE.  09/09/2020 extended phone call in crisis with nurse.  She was still transitioning from sertraline to duloxetine and trazodone was added for sleep. She's refusing ECT consult with [facility] and didn't go. She has found a new therapist and seen [name] [name] several  times and she's local.  She is hopeful it might help. On duloxetine 90 mg since about 09/02/20. Never took buspirone but did receive it. Takes Ativan. Taking topiramate 75 mg in AM and on 50 mg PM.  Never increased to 75 mg BID. Takes Ambien initially and trazodone 25 mg HS and sleep not as good with duloxetine vs Zoloft.  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  Before that was sobbing and often not staying with herself. FMLA filled out for accomodation for leave if crying spells are unmanageable. Used to call [name] [name] when she got in the car and [name] [name] offered to have [name] [name] call but he's less available.  So often cries in the car but onlly one bad crying spell after Nudexta. Hx migraine tx by neurologist who said MRI showed spots suggesting head injury.  Pt never remembers history of head injury. 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 Tomorrow is [name] [name] and [name] [name]'s birthday and having family party tonight. Anniversary of 2 years is 10/25 and doing better about staying alone in [name] [name] house. Uncertain mood effect from change to duloxetine so far but thinks maybe she's a little better. Doesn't tolerate being alone very  well.  Worry about winter. Plan continue Nuedexta was helpful for crying spells though it did not resolve them Continue duloxetine 90 mg to give it more time to work as it is only been about 3 weeks Okay per [name] [name] request to increase topiramate to 75 mg twice daily.  10/26/2020 appointment with the following noted: Takes Ativan in morning.  Drinks more caffeine at home than work.  Might cause some anxiety.  But anxious at work too over insecurity and takes Ativan in morning also.  Takes it to drive. Average 2-3 Ativan daily.  Less anxious if not alone. On duloxetine 90 for 7 weeks. Crying spells still better on Nudexta than not.  Better than last time. Duloxetine is helping with depression and anxiety both are better than last visit.  Dep 5/10.  Anxiety  6/10 bc of work and driving.  Friends notice the benefit also. Not a winter person and dreads it. Not much appetite in evening but eats good lunch at work and breakfast.  Topiramate has curbed [name] [name] appetite.   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   Work stressful and hates driving including in the winter and doesn't think she can do it anymore.   Sleep about 5 hours and sometimes can't get back to sleep on trazodone 50 and Ambien and Ativan but doesn't think she can take more trazodone DT hangover.  No amnesia.   Past Psychiatric Medication Trials:  Zoloft 300, Lexapro,, paroxetine 80 in 2017, Duloxetine 90 Nudexta helped crying  Paliperidone, olanzapine, aripiprazole 20 mg, buspirone, Rexulti 3 mg sleepy clonidine, topiramate, gabapentin, metformin,  phentermine,  trazodone, Ambien, Ativan,  Remote history topiramate for migraine 1998, Belviq helped lose 85#  Review of Systems:  Review of Systems  Constitutional: Positive for fatigue. Negative for unexpected weight change.       Sweating  Cardiovascular: Negative for chest pain.  Gastrointestinal: Negative for rectal pain.  Genitourinary: Negative for pelvic pain.  Musculoskeletal:  Positive for myalgias.  Neurological: Negative for tremors and weakness.  Psychiatric/Behavioral: Positive for depression and dysphoric mood. Negative for agitation, behavioral problems, confusion, decreased concentration, hallucinations, self-injury, sleep disturbance and suicidal ideas. The patient is nervous/anxious. The patient is not hyperactive.     Medications: I have reviewed the  patient's current medications.  Current Outpatient Medications  Medication Sig Dispense Refill  . albuterol (PROAIR HFA) 108 (90 Base) MCG/ACT inhaler Inhale 2 puffs into the lungs every 6 (six) hours as needed for wheezing or shortness of breath. Use as needed only  if your can't catch your breath 18 g 3  . amLODipine (NORVASC) 5 MG tablet Take 1 tablet (5 mg total) by mouth daily. 90 tablet 0  . B Complex Vitamins (B-COMPLEX/B-12) LIQD Place 5 mLs under the tongue daily at 2 PM daily at 2 PM.    . Cholecalciferol (VITAMIN [name] [name]) 125 MCG (5000 UT) CAPS Take 5,000 Units by mouth daily.     Marland Kitchen Dextromethorphan-quiNIDine (NUEDEXTA) 20-10 MG capsule Take 1 capsule by mouth 2 (two) times daily. 60 capsule 2  . DULoxetine (CYMBALTA) 30 MG capsule Take 3 capsules (90 mg total) by mouth daily. 270 capsule 0  . estradiol (ESTRACE) 0.5 MG tablet TAKE 1 TABLET BY MOUTH ONCE DAILY 30 tablet 3  . fexofenadine (ALLEGRA) 180 MG tablet Take 180 mg by mouth daily.    Marland Kitchen gabapentin (NEURONTIN) 800 MG tablet Take 1 tablet (800 mg total) by mouth 4 (four) times daily. 120 tablet 1  . hydrochlorothiazide (MICROZIDE) 12.5 MG capsule Take 1 capsule (12.5 mg total) by mouth daily. 90 capsule 0  . LORazepam (ATIVAN) 1 MG tablet TAKE 1 TABLET BY MOUTH EVERY 6 HOURS AS NEEDED 60 tablet 1  . mometasone (NASONEX) 50 MCG/ACT nasal spray Place 2 sprays into the nose daily. 17 g 3  . montelukast (SINGULAIR) 10 MG tablet Take 1 tablet (10 mg total) by mouth daily. 90 tablet 0  . nebivolol (BYSTOLIC) 10 MG tablet Take 1 tablet (10 mg total) by mouth  2 (two) times daily. 180 tablet 0  . NP THYROID 60 MG tablet TAKE 2 TABLETS BY MOUTH ONCE A DAY 60 tablet 1  . pantoprazole (PROTONIX) 40 MG tablet Take 1 tablet (40 mg total) by mouth 2 (two) times daily. 60 tablet 2  . progesterone (PROMETRIUM) 100 MG capsule Take 1 capsule (100 mg total) by mouth daily. 90 capsule 0  . topiramate (TOPAMAX) 25 MG tablet TAKE 3 TABLETS BY MOUTH TWICE DAILY 180 tablet 1  . Travoprost, BAK Free, (TRAVATAN) 0.004 % SOLN ophthalmic solution Place 1 drop into both eyes at bedtime.    . traZODone (DESYREL) 50 MG tablet Take 1 tablet (50 mg total) by mouth at bedtime. 30 tablet 0  . zolpidem (AMBIEN) 10 MG tablet TAKE 1/2 TO 1 TABLET BY MOUTH AT BEDTIME AS NEEDED 30 tablet 5  . busPIRone (BUSPAR) 7.5 MG tablet Take 1 tablet (7.5 mg total) by mouth 3 (three) times daily. (Patient not taking: Reported on 10/26/2020) 90 tablet 1   No current facility-administered medications for this visit.    Medication Side Effects: Other: ? sweating related.  Allergies:  Allergies  Allergen Reactions  . Levaquin [Levofloxacin] Other (See Comments)    MUSCLE PAIN  . Nickel Rash    Past Medical History:  Diagnosis Date  . Allergy   . Anxiety   . Bronchiectasis   . Bronchitis    chronic  . 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 COPD (chronic obstructive pulmonary disease) ([place])   . Dental crown present    upper  . Depression   . Environmental allergies    receives allergy shots  . GERD (gastroesophageal reflux disease)   . Glaucoma high risk    is monitored every 6  mos. - no current med.  . Glucose intolerance (impaired glucose tolerance)    on Metformin  . [name] [name]/O hiatal hernia    "sliding"  . History of echocardiogram    Echo 1/16: EF 60-65, no RWMA, Gr 1 DD, mild LAE, trivial pericardial eff post to heart  . History of echocardiogram    Echo 11/17: EF 55-60, no RWMA, Gr 1 DD, normal RV function.  Marland Kitchen History of palpitations    Holter 12/15: NSR, PACs  . Hx of  migraines   . Hypothyroidism   . Osteoarthritis    knees  . Positional headache since 1997   if lies on left side    Family History  Problem Relation Age of Onset  . Lung cancer [name] [name] 79       dies age 68  . Emphysema [name] [name]   . COPD Brother 52       died age 32  . Stroke Mother   . Hypertension Sister   . Thyroid disease Sister        Graves Disease    Social History   Socioeconomic History  . Marital status: Widowed    Spouse name: Not on file  . Number of children: Not on file  . Years of education: Not on file  . Highest education level: Not on file  Occupational History  . Occupation: Land    Employer: [employer]  Tobacco Use  . Smoking status: Never Smoker  . Smokeless tobacco: Never Used  Vaping Use  . Vaping Use: Never used  Substance and Sexual Activity  . Alcohol use: Yes    Alcohol/week: 4.0 standard drinks    Types: 4 Glasses of wine per week  . Drug use: No  . Sexual activity: Yes    Partners: Male    Birth control/protection: Post-menopausal  Other Topics Concern  . Not on file  Social History Narrative   Widowed late 2019 after 40+ years of marriage   3 sons one daughter   Working as a Company secretary for [employer]   No/never tobacco no drug use 1 caffeinated beverage daily, occasional or rare glass of wine   Social Determinants of Health   Financial Resource Strain:   . Difficulty of Paying Living Expenses: Not on file  Food Insecurity:   . Worried About [name] in the Last Year: Not on file  . Ran Out of Food in the Last Year: Not on file  Transportation Needs:   . Lack of Transportation (Medical): Not on file  . Lack of Transportation (Non-Medical): Not on file  Physical Activity:   . Days of Exercise per Week: Not on file  . Minutes of Exercise per Session: Not on file  Stress:   . Feeling of Stress : Not on file  Social Connections:   . Frequency of Communication with Friends and Family: Not on  file  . Frequency of Social Gatherings with Friends and Family: Not on file  . Attends Religious Services: Not on file  . Active Member of Clubs or Organizations: Not on file  . Attends Archivist Meetings: Not on file  . Marital Status: Not on file  Intimate Partner Violence:   . Fear of Current or Ex-Partner: Not on file  . Emotionally Abused: Not on file  . Physically Abused: Not on file  . Sexually Abused: Not on file    Past Medical History, Surgical history, Social history, and Family history  were reviewed and updated as appropriate.   Please see review of systems for further details on the patient's review from today.   Objective:   Physical Exam:  LMP 12/12/2005   Physical Exam Constitutional:      General: She is not in acute distress.    Appearance: She is well-developed. She is obese.  Musculoskeletal:        General: No deformity.  Neurological:     Mental Status: She is alert and oriented to person, place, and time.     Cranial Nerves: No dysarthria.     Coordination: Coordination normal.  Psychiatric:        Attention and Perception: Perception normal. She is attentive. She does not perceive auditory or visual hallucinations.        Mood and Affect: Mood is anxious and depressed. Affect is not labile, blunt, angry, tearful or inappropriate.        Speech: Speech normal. Speech is not slurred.        Behavior: Behavior normal. Behavior is not slowed. Behavior is cooperative.        Thought Content: Thought content normal. Thought content is not paranoid or delusional. Thought content does not include homicidal or suicidal ideation. Thought content does not include homicidal or suicidal plan.        Cognition and Memory: Cognition and memory normal.     Comments: Affect much less labile. Fair insight and judgment   Gained 100# while [name] [name] sick.  Lab Review:     Component Value Date/Time   NA 138 08/27/2020 1126   K 3.4 (L) 08/27/2020 1126   CL 98  08/27/2020 1126   CO2 29 08/27/2020 1126   GLUCOSE 119 ([name] [name]) 08/27/2020 1126   BUN 19 08/27/2020 1126   CREATININE 0.93 08/27/2020 1126   CALCIUM 9.7 08/27/2020 1126   PROT 6.9 08/27/2020 1126   ALBUMIN 4.4 02/05/2015 0850   AST 17 08/27/2020 1126   ALT 25 08/27/2020 1126   ALKPHOS 53 02/05/2015 0850   BILITOT 0.5 08/27/2020 1126   GFRNONAA 65 08/27/2020 1126   GFRAA 75 08/27/2020 1126       Component Value Date/Time   WBC 8.5 08/27/2020 1126   RBC 4.60 08/27/2020 1126   HGB 14.4 08/27/2020 1126   HCT 42.6 08/27/2020 1126   PLT 293 08/27/2020 1126   MCV 92.6 08/27/2020 1126   MCH 31.3 08/27/2020 1126   MCHC 33.8 08/27/2020 1126   RDW 12.5 08/27/2020 1126   LYMPHSABS 1,148 08/27/2020 1126   MONOABS 0.6 03/23/2018 1244   EOSABS 34 08/27/2020 1126   BASOSABS 17 08/27/2020 1126    No results found for: POCLITH, LITHIUM   No results found for: PHENYTOIN, PHENOBARB, VALPROATE, CBMZ   .res Assessment: Plan:    Teyla was seen today for follow-up, anxiety and depression.  Diagnoses and all orders for this visit:  Severe episode of recurrent major depressive disorder, without psychotic features ([place])  Panic disorder with agoraphobia  Generalized anxiety disorder  Complicated grief  Insomnia due to mental condition  Pseudobulbar affect     Supportive therapy dealing with [name] [name] Mary Tate's mental health problems.  Answered questions about trying to get [name] [name] help.  Greater than 50% of 30 minface to face time with patient was spent on counseling and coordination of care. We discussed Disc the difference between depression and grief.  [name] [name] [name] [name] would not talk about his cancer and wouldn't prepare [name] [name] for his death. Supportive and grief work.  Rec therapeutic letter writing as she is still having a problem. [name] [name] panic disorder is generally controlled but more anxious somewhat.. She is still dealing with a lot of grief with waves of depression and anxiety and at times easily  overwhelmed but functional.  Disc importance of staying busy to help with depression and anxiety  Nuedexta markedly helpful for crying but hasn't resolved it.  Duloxetine 90 on 7 weeks with benefit..  Increase dulxetine to max to see if can get more benefit bc sig residual sx. 120 mg daily.   Discussed the possibility this could be contributing to weight gain.  Realistically the only alternative would be fluoxetine and it is not as good of an anxiety med.  [name] [name] anxiety has been severe in the past.  I would advise against changing or reducing the dose at this time because she has recently had a little more depression and anxiety than baseline.  She is not using the lorazepam excessively.  She is taking it appropriately.  The diazepam is prescribed vaginally by [name] [name] gynecologist for chronic pelvic pain and not being used. We discussed the short-term risks associated with benzodiazepines including sedation and increased fall risk among others.  Discussed long-term side effect risk including dependence, potential withdrawal symptoms, and the potential eventual dose-related risk of dementia.  Disc the risk of Ambien amnesia.  Prefer avoid stimulant weight loss meds per PCP.  Naltrexone vs topiramate.  She tolerated topiramate for a while in the past for migraine.  Topiramate continue 75 mg BID.   To help weight and anxiety off label. She has been successful losing weight on this.  It also is used for HA.   Naltrexone or metformin or Ozempic  are other options.  Pain is better but not gone and in therapy.  Treated for OSA by Dr. Baird Lyons.  Started Needed counseling.  She commits.  If retires disc the importance of picking good Medicare [name] [name] plan that will cover the Nudexta in case it's needed.  This appt was 30 mins.  FU 4 weeks  Lynder Parents, MD, DFAPA  Please see After Visit Summary for patient specific instructions.    Future Appointments  Date Time Provider [place]   11/10/2020  2:30 PM Doree Albee, MD [place] None  11/18/2020  4:00 PM [facility] MM 3 [facility]  11/25/2020  4:30 PM Cottle, [place]., MD [facility] None    No orders of the defined types were placed in this encounter.     -------------------------------

## 2020-10-27 ENCOUNTER — Other Ambulatory Visit (HOSPITAL_COMMUNITY): Payer: Self-pay | Admitting: Dermatology

## 2020-10-27 ENCOUNTER — Other Ambulatory Visit (INDEPENDENT_AMBULATORY_CARE_PROVIDER_SITE_OTHER): Payer: Self-pay

## 2020-10-27 ENCOUNTER — Other Ambulatory Visit (INDEPENDENT_AMBULATORY_CARE_PROVIDER_SITE_OTHER): Payer: Self-pay | Admitting: Internal Medicine

## 2020-10-27 DIAGNOSIS — L218 Other seborrheic dermatitis: Secondary | ICD-10-CM | POA: Diagnosis not present

## 2020-10-27 DIAGNOSIS — L718 Other rosacea: Secondary | ICD-10-CM | POA: Diagnosis not present

## 2020-10-27 MED ORDER — THYROID 60 MG PO TABS
120.0000 mg | ORAL_TABLET | Freq: Every day | ORAL | 1 refills | Status: DC
Start: 2020-10-27 — End: 2021-03-01

## 2020-10-27 MED FILL — NUEDEXTA 20-10 MG CAPSULE: 20-10 | 30 days supply | Qty: 60 | Fill #1

## 2020-10-27 MED FILL — NP THYROID 60 MG TABLET: 60 | 30 days supply | Qty: 60 | Fill #0

## 2020-10-27 MED FILL — DESONIDE 0.05% CREAM: 0.05 | 15 days supply | Qty: 30 | Fill #0

## 2020-10-28 DIAGNOSIS — F411 Generalized anxiety disorder: Secondary | ICD-10-CM | POA: Diagnosis not present

## 2020-10-28 DIAGNOSIS — F331 Major depressive disorder, recurrent, moderate: Secondary | ICD-10-CM | POA: Diagnosis not present

## 2020-10-28 DIAGNOSIS — Z634 Disappearance and death of family member: Secondary | ICD-10-CM | POA: Diagnosis not present

## 2020-10-29 MED FILL — MOMETASONE FUROATE 50 MCG S: 50 | 30 days supply | Qty: 17 | Fill #1

## 2020-10-29 MED FILL — TRAVOPROST (BAK FREE) 0.004: 0.004 | 75 days supply | Qty: 8 | Fill #0

## 2020-10-29 MED FILL — IBUPROFEN 800 MG TAB: 800 | 20 days supply | Qty: 60 | Fill #3

## 2020-11-02 ENCOUNTER — Other Ambulatory Visit (INDEPENDENT_AMBULATORY_CARE_PROVIDER_SITE_OTHER): Payer: Self-pay | Admitting: Internal Medicine

## 2020-11-02 ENCOUNTER — Telehealth (INDEPENDENT_AMBULATORY_CARE_PROVIDER_SITE_OTHER): Payer: Self-pay

## 2020-11-02 ENCOUNTER — Other Ambulatory Visit: Payer: Self-pay | Admitting: Psychiatry

## 2020-11-02 DIAGNOSIS — F411 Generalized anxiety disorder: Secondary | ICD-10-CM

## 2020-11-02 DIAGNOSIS — J4540 Moderate persistent asthma, uncomplicated: Secondary | ICD-10-CM

## 2020-11-02 DIAGNOSIS — F4001 Agoraphobia with panic disorder: Secondary | ICD-10-CM

## 2020-11-02 DIAGNOSIS — J302 Other seasonal allergic rhinitis: Secondary | ICD-10-CM

## 2020-11-02 DIAGNOSIS — I10 Essential (primary) hypertension: Secondary | ICD-10-CM

## 2020-11-02 DIAGNOSIS — E559 Vitamin D deficiency, unspecified: Secondary | ICD-10-CM

## 2020-11-02 MED ORDER — MONTELUKAST SODIUM 10 MG PO TABS: 10.0000 mg | ORAL_TABLET | Freq: Every day | ORAL | 0 refills | Status: DC

## 2020-11-02 MED FILL — PANTOPRAZOLE SOD DR 40 MG T: 40 | 30 days supply | Qty: 60 | Fill #0

## 2020-11-02 MED FILL — MONTELUKAST SOD 10 MG TAB: 10 | 90 days supply | Qty: 90 | Fill #0

## 2020-11-02 MED FILL — NEBIVOLOL HCL 10 MG TABS: 10 | 30 days supply | Qty: 60 | Fill #6

## 2020-11-02 MED FILL — TOPIRAMATE 25 MG TABLET: 25 | 30 days supply | Qty: 180 | Fill #0

## 2020-11-02 NOTE — Telephone Encounter (Signed)
Patient called and left a detailed voice message and stated that she needs a refill of the following medication:  montelukast (SINGULAIR) 10 MG tablet Last filled 08/28/2020, # 90 with 0 refills

## 2020-11-10 ENCOUNTER — Encounter (INDEPENDENT_AMBULATORY_CARE_PROVIDER_SITE_OTHER): Payer: 59 | Admitting: Internal Medicine

## 2020-11-17 MED FILL — ZOLPIDEM TARTRATE 10 MG TAB: 10 | 30 days supply | Qty: 30 | Fill #3

## 2020-11-18 ENCOUNTER — Ambulatory Visit
Admission: RE | Admit: 2020-11-18 | Discharge: 2020-11-18 | Disposition: A | Discharge status: Home / Self Care | Payer: 59 | Source: Ambulatory Visit | Attending: Obstetrics and Gynecology | Admitting: Obstetrics and Gynecology

## 2020-11-18 ENCOUNTER — Other Ambulatory Visit: Payer: Self-pay

## 2020-11-18 DIAGNOSIS — Z1231 Encounter for screening mammogram for malignant neoplasm of breast: Secondary | ICD-10-CM

## 2020-11-19 MED FILL — HYDROCHLOROTHIAZIDE 12.5 MG: 12.5 | 90 days supply | Qty: 90 | Fill #0

## 2020-11-20 ENCOUNTER — Other Ambulatory Visit: Payer: Self-pay | Admitting: Psychiatry

## 2020-11-20 ENCOUNTER — Other Ambulatory Visit: Payer: Self-pay

## 2020-11-20 DIAGNOSIS — H401134 Primary open-angle glaucoma, bilateral, indeterminate stage: Secondary | ICD-10-CM | POA: Diagnosis not present

## 2020-11-20 DIAGNOSIS — F4001 Agoraphobia with panic disorder: Secondary | ICD-10-CM

## 2020-11-20 MED ORDER — LORAZEPAM 1 MG PO TABS: 1.0000 mg | ORAL_TABLET | Freq: Four times a day (QID) | ORAL | 1 refills | Status: DC | PRN

## 2020-11-20 MED FILL — LORazepam 1 MG TABS: 1 | 15 days supply | Qty: 60 | Fill #0

## 2020-11-23 DIAGNOSIS — Z634 Disappearance and death of family member: Secondary | ICD-10-CM | POA: Diagnosis not present

## 2020-11-23 DIAGNOSIS — F411 Generalized anxiety disorder: Secondary | ICD-10-CM | POA: Diagnosis not present

## 2020-11-23 DIAGNOSIS — F331 Major depressive disorder, recurrent, moderate: Secondary | ICD-10-CM | POA: Diagnosis not present

## 2020-11-24 DIAGNOSIS — R32 Unspecified urinary incontinence: Secondary | ICD-10-CM | POA: Diagnosis not present

## 2020-11-24 DIAGNOSIS — N819 Female genital prolapse, unspecified: Secondary | ICD-10-CM | POA: Diagnosis not present

## 2020-11-24 MED FILL — NP THYROID 60 MG TABLET: 60 | 30 days supply | Qty: 60 | Fill #1

## 2020-11-25 ENCOUNTER — Other Ambulatory Visit: Payer: Self-pay

## 2020-11-25 ENCOUNTER — Other Ambulatory Visit: Payer: Self-pay | Admitting: Psychiatry

## 2020-11-25 ENCOUNTER — Encounter: Payer: Self-pay | Admitting: Psychiatry

## 2020-11-25 ENCOUNTER — Ambulatory Visit (INDEPENDENT_AMBULATORY_CARE_PROVIDER_SITE_OTHER): Payer: 59 | Admitting: Psychiatry

## 2020-11-25 DIAGNOSIS — F5105 Insomnia due to other mental disorder: Secondary | ICD-10-CM | POA: Diagnosis not present

## 2020-11-25 DIAGNOSIS — G4733 Obstructive sleep apnea (adult) (pediatric): Secondary | ICD-10-CM

## 2020-11-25 DIAGNOSIS — F4321 Adjustment disorder with depressed mood: Secondary | ICD-10-CM | POA: Diagnosis not present

## 2020-11-25 DIAGNOSIS — F482 Pseudobulbar affect: Secondary | ICD-10-CM

## 2020-11-25 DIAGNOSIS — F4001 Agoraphobia with panic disorder: Secondary | ICD-10-CM | POA: Diagnosis not present

## 2020-11-25 DIAGNOSIS — F411 Generalized anxiety disorder: Secondary | ICD-10-CM | POA: Diagnosis not present

## 2020-11-25 DIAGNOSIS — F332 Major depressive disorder, recurrent severe without psychotic features: Secondary | ICD-10-CM

## 2020-11-25 MED ORDER — RISPERIDONE 1 MG PO TABS: 1.0000 mg | ORAL_TABLET | Freq: Two times a day (BID) | ORAL | 1 refills | Status: DC | PRN

## 2020-11-25 MED ORDER — TRAZODONE HCL 50 MG PO TABS: 50.0000 mg | ORAL_TABLET | Freq: Every day | ORAL | 0 refills | Status: DC

## 2020-11-25 MED FILL — risperiDONE 1 MG TABS: 1 | 15 days supply | Qty: 30 | Fill #0

## 2020-11-25 MED FILL — NUEDEXTA 20-10 MG CAPSULE: 20-10 | 30 days supply | Qty: 60 | Fill #2

## 2020-11-25 NOTE — Progress Notes (Signed)
[name] [name] [id number] December 04, 1956 64 y.o.  Subjective:   Patient ID:  [name] [name] is a 64 y.o. (DOB Oct 22, 1956) female.  Chief Complaint:  Chief Complaint  Patient presents with  . Follow-up  . Depression  . Anxiety    Depression        Associated symptoms include fatigue and myalgias.  Associated symptoms include no decreased concentration and no suicidal ideas. Medication Refill Associated symptoms include fatigue and myalgias. Pertinent negatives include no chest pain or weakness.   [name] [name] presents to the office today for follow-up of anxiety.  seen April 2020.  She was dealing with grief and depression and sertraline was increased back to 200 mg daily.  [name] [name] died in Oct 04, 2018  from cancer.  Married 41 years.  Is able to focus on positive memories.  Not ready to move.  seen August 2020.  No meds were changed.  She continued sertraline 200 mg daily.  seen January 2020.  Sertraline was unchanged.  She was having still grief issues related to the loss of her husband.  She was also having problems with weight attributed to the sertraline.  She was given a trial of topiramate to try to help offset some of the weight gain plus it can have some potential antianxiety effects.  seen 02/19/20 stating: Very depressed. So sad.  Don't want to get OOB.  Got lost coming here and late.  Worse for 3-4 weeks.  Days off are terrible.  Barely able to function at work.  Cry all the way home after work.  So lonely.  Kids aren't coming like they were bc they are busy.  Need to figure out her own life.  Life was centered around [name] [name] and kids.  Denies suicidal thoughts  Made following med changes: Increase sertraline back to 300 mg daily.  She's aware it's higher than the expected usual dose. Rexulti 2 mg daily Topiramate increase to target 50 mg BID and possibly higher for obesity.  03/18/2020 appointment the following is noted: No meds were changed. She didn't increase  sertraline bc fear of weight gain.  Quite a bit different.  No crying.  Sad a few times.  Better in 5 days.  Feels much less depressed.  Better energy, interest, enjoyment.  Also weather helping and started planting.  More motivation.  Desperate to lose weight.  Has some chronic pain and taking gabapentin.   Started gym cycle and lost some weight.   Worried over [name] [name] with depression and conflict with pt.  [name] [name] was always closer to father than her.  [name] [name] distanced from her and recent bad outburst with her.  [name] [name] recently blocked communication with her.   Disc getting her help.  05/19/2020 appointment with the following noted: Tried to self medicate bc felt good on Rexulti and tried reducing sertraline to 150 for 2 weeks and got worse.  So increased back to 100 mg BID. Stopped cable and got too bored for 3 mos.  Restarted cable yesterday.   Still grieving.  Kids want her to sell the house but she can't. Anxiety in the morning fidgety.  Taken more Ativan lately with more panic and worry over the future.  Almost like too much caffeine before any.  Limits it. Better if busy. Does better with working.  Was too sleepy with progesterone. Successful losing weight on the topiramate. Assessment/Plan: Her panic disorder is generally controlled but more anxious somewhat..She is still dealing with a lot of grief with  waves of depression and anxiety and at times easily overwhelmed but functional.   trial Rexulti 3 mg daily for 2 weeks to see if can gain more benefit for depression and anxiety. If no benefit then reduce Rexulti back to 2 mg.  She'll call  06/01/2020 phone call reporting that 3 mg Rexulti seem to be helping and she wanted to continue it. 06/03/2020, 2 days later she called back stating it was making her too sleepy.  She was planning a trip and she was encouraged to stop the medicine until she returns from her trip and we would reevaluate. 06/11/2020 phone call stating after stopping the Rexulti she was said,  depressed and crying all the time and wanted to return to Rexulti at 1 mg daily to which that was agreed. 07/02/2020 she called back wanting to milligram tablets of Rexulti because she had increased it on her own to that dosage and felt better.  She also had stopped drinking wine in the evening which she felt like helped.  So at her request a prescription was submitted for 2 mg Rexulti to the pharmacy. 07/16/2020 phone call stating she was very lonely and wanted to go back up to 3 mg of Rexulti.  That request was refused because of her previous side effects and making frequent changes with a med with a very long half-life like Rexulti is not a good idea.  07/21/2020 appointment with the following noted: Pretty desperate to do something. She increased Rexulti AMA to 3 mg despite being asked not to do so. Still sleepy and anxious.  Stopped wine for several weeks Ativan 1 -2 mg daily at work bc very anxious. Yesterday when son was there she wasn't sleepy or depressed until he left. Denies drowsiness when driving. Feels no better with grief. Plan: Stop Rexulti DT sleepiness.   Start Latuda 20 mg daily but she refuses bc doesn't think she can eat enough with it.  Then says she'll try it.  Topiramate She would like to increase further; OK increase to 75 mg BID.  No therapy since husband died. Needs counseling desperately.  She commits.  09/22/2020 appointment with the following noted Multiple phone calls since the last visit in crisis. Tried Latuda up to 40 mg a day with 3 mg Ativan daily.  She felt like it made her worse and stopped it. Recommended haloperidol 2 mg tablets 1-2 nightly due to extreme unmanageable anxiety and fearfulness.   08/18/2020 telephone call with the following noted:Tried haloperidol 1 mg and didn't help anything.  No effect except a little sleepiness.  Took it sparingly bc afraid of it.  Afraid of tremors and TD.  I didn't to give it a good chance. Strongly rec ECT.  Currently  will consider and agrees to go to [facility] for consult.  I'm scared.  Scared of meds too. Currently on sertraline 200 mg daily. Plan: Reduce sertraline to 150 daily and start duloxetine 30 mg capsule daily for 5 days, Then reduce sertraline to 100 mg and increase duloxetine to 2 capsules daily for 5 days, Then reduce sertraline to 1/2 of 100 mg aily and increase duloxetine to 90 mg daily for 5 days then stop sertraline and continue duloxetine 90 mg daily. Disc SE.  09/09/2020 extended phone call in crisis with nurse.  She was still transitioning from sertraline to duloxetine and trazodone was added for sleep. She's refusing ECT consult with [facility] and didn't go. She has found a new therapist and seen her several  times and she's local.  She is hopeful it might help. On duloxetine 90 mg since about 09/02/20. Never took buspirone but did receive it. Takes Ativan. Taking topiramate 75 mg in AM and on 50 mg PM.  Never increased to 75 mg BID. Takes Ambien initially and trazodone 25 mg HS and sleep not as good with duloxetine vs Zoloft.  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  Before that was sobbing and often not staying with herself. FMLA filled out for accomodation for leave if crying spells are unmanageable. Used to call [name] [name] when she got in the car and son offered to have her call but he's less available.  So often cries in the car but onlly one bad crying spell after Nudexta. [name] migraine tx by neurologist who said MRI showed spots suggesting head injury.  Pt never remembers history of head injury. 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 Tomorrow is her and [name] [name]'s birthday and having family party tonight. Anniversary of 2 years is 10/25 and doing better about staying alone in her house. Uncertain mood effect from change to duloxetine so far but thinks maybe she's a little better. Doesn't tolerate being alone very  well.  Worry about winter. Plan continue Nuedexta was helpful for crying spells though it did not resolve them Continue duloxetine 90 mg to give it more time to work as it is only been about 3 weeks Okay per her request to increase topiramate to 75 mg twice daily.  10/26/2020 appointment with the following noted: Takes Ativan in morning.  Drinks more caffeine at home than work.  Might cause some anxiety.  But anxious at work too over insecurity and takes Ativan in morning also.  Takes it to drive. Average 2-3 Ativan daily.  Less anxious if not alone. On duloxetine 90 for 7 weeks. Crying spells still better on Nudexta than not.  Better than last time. Duloxetine is helping with depression and anxiety both are better than last visit.  Dep 5/10.  Anxiety  6/10 bc of work and driving.  Friends notice the benefit also. Not a winter person and dreads it. Not much appetite in evening but eats good lunch at work and breakfast.  Topiramate has curbed her appetite.   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   Work stressful and hates driving including in the winter and doesn't think she can do it anymore.   Sleep about 5 hours and sometimes can't get back to sleep on trazodone 50 and Ambien and Ativan but doesn't think she can take more trazodone DT hangover.  No amnesia.  Plan: Nuedexta markedly helpful for crying but hasn't resolved it. Duloxetine 90 on 7 weeks with benefit..  Increase dulxetine to max to see if can get more benefit bc sig residual sx. 120 mg daily.  11/25/2020 appointment with the following noted: Hard time. Since here.  Started crying a lot again.  Has been consistent with Nudexta.  Felt good for 3-4 days then last night had neg interaction with daughter while in men's section of [employer].  Got triggered over seeing men's clothes and [name] [name] got mad.  She was crying and got a disoriented and had trouble finding her way out.  She doesn't understand her grief.  Crying for weeks and then briefly  better and recurred. Plan to retire Feb but scared to stay by herself.  Work is only socialization she has and it's place she can enjoy things.  Never stayed alone before. I can't stay by myself.  Not afraid but lonely.  No hobbies.  Past Psychiatric Medication Trials:  Zoloft 300, Lexapro,, paroxetine 80 in 2017, Duloxetine 120 Nudexta helped crying for awhile until increase duloxetine and holidays Paliperidone, olanzapine, aripiprazole 20 mg, buspirone, Rexulti 3 mg sleepy clonidine, topiramate, gabapentin, metformin,  phentermine,  trazodone, Ambien, Ativan,  Remote history topiramate for migraine 1998, [place] helped lose 85#  Review of Systems:  Review of Systems  Constitutional: Positive for fatigue. Negative for unexpected weight change.       Sweating  Cardiovascular: Negative for chest pain and palpitations.  Gastrointestinal: Negative for rectal pain.  Genitourinary: Negative for pelvic pain.  Musculoskeletal: Positive for myalgias.  Neurological: Negative for tremors and weakness.  Psychiatric/Behavioral: Positive for depression and dysphoric mood. Negative for agitation, behavioral problems, confusion, decreased concentration, hallucinations, self-injury, sleep disturbance and suicidal ideas. The patient is nervous/anxious. The patient is not hyperactive.     Medications: I have reviewed the patient's current medications.  Current Outpatient Medications  Medication Sig Dispense Refill  . albuterol (PROAIR HFA) 108 (90 Base) MCG/ACT inhaler Inhale 2 puffs into the lungs every 6 (six) hours as needed for wheezing or shortness of breath. Use as needed only  if your can't catch your breath 18 g 3  . amLODipine (NORVASC) 5 MG tablet Take 1 tablet (5 mg total) by mouth daily. 90 tablet 0  . B Complex Vitamins (B-COMPLEX/B-12) LIQD Place 5 mLs under the tongue daily at 2 PM daily at 2 PM.    . Cholecalciferol (VITAMIN [name] [name]) 125 MCG (5000 UT) CAPS Take 5,000 Units by mouth daily.      Marland Kitchen Dextromethorphan-quiNIDine (NUEDEXTA) 20-10 MG capsule Take 1 capsule by mouth 2 (two) times daily. 60 capsule 2  . DULoxetine (CYMBALTA) 30 MG capsule Take 3 capsules (90 mg total) by mouth daily. (Patient taking differently: Take 120 mg by mouth daily.) 270 capsule 0  . estradiol (ESTRACE) 0.5 MG tablet TAKE 1 TABLET BY MOUTH ONCE DAILY 30 tablet 3  . fexofenadine (ALLEGRA) 180 MG tablet Take 180 mg by mouth daily.    Marland Kitchen gabapentin (NEURONTIN) 800 MG tablet Take 1 tablet (800 mg total) by mouth 4 (four) times daily. 120 tablet 1  . hydrochlorothiazide (MICROZIDE) 12.5 MG capsule Take 1 capsule (12.5 mg total) by mouth daily. 90 capsule 0  . LORazepam (ATIVAN) 1 MG tablet Take 1 tablet (1 mg total) by mouth every 6 (six) hours as needed. 60 tablet 1  . mometasone (NASONEX) 50 MCG/ACT nasal spray Place 2 sprays into the nose daily. 17 g 3  . montelukast (SINGULAIR) 10 MG tablet Take 1 tablet (10 mg total) by mouth daily. 90 tablet 0  . nebivolol (BYSTOLIC) 10 MG tablet Take 1 tablet (10 mg total) by mouth 2 (two) times daily. 180 tablet 0  . pantoprazole (PROTONIX) 40 MG tablet Take 1 tablet (40 mg total) by mouth 2 (two) times daily. 60 tablet 2  . progesterone (PROMETRIUM) 100 MG capsule Take 1 capsule (100 mg total) by mouth daily. 90 capsule 0  . thyroid (NP THYROID) 60 MG tablet Take 2 tablets (120 mg total) by mouth daily. 60 tablet 1  . topiramate (TOPAMAX) 25 MG tablet TAKE 3 TABLETS BY MOUTH TWICE DAILY 180 tablet 1  . Travoprost, BAK Free, (TRAVATAN) 0.004 % SOLN ophthalmic solution Place 1 drop into both eyes at bedtime.    Marland Kitchen zolpidem (AMBIEN) 10 MG tablet TAKE 1/2 TO 1  TABLET BY MOUTH AT BEDTIME AS NEEDED 30 tablet 5  . busPIRone (BUSPAR) 7.5 MG tablet Take 1 tablet (7.5 mg total) by mouth 3 (three) times daily. (Patient not taking: Reported on 11/25/2020) 90 tablet 1  . risperiDONE (RISPERDAL) 1 MG tablet Take 1 tablet (1 mg total) by mouth 2 (two) times daily as needed (severe  crying and anxiety). 30 tablet 1  . traZODone (DESYREL) 50 MG tablet Take 1 tablet (50 mg total) by mouth at bedtime. 30 tablet 0   No current facility-administered medications for this visit.    Medication Side Effects: Other: ? sweating related.  Allergies:  Allergies  Allergen Reactions  . Levaquin [Levofloxacin] Other (See Comments)    MUSCLE PAIN  . Nickel Rash    Past Medical History:  Diagnosis Date  . Allergy   . Anxiety   . Bronchiectasis   . Bronchitis    chronic  . 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 COPD (chronic obstructive pulmonary disease) ([place])   . Dental crown present    upper  . Depression   . Environmental allergies    receives allergy shots  . GERD (gastroesophageal reflux disease)   . 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 - no current med.  . Glucose intolerance (impaired glucose tolerance)    on Metformin  . [name] [name]/O hiatal hernia    "sliding"  . History of echocardiogram    Echo 1/16: EF 60-65, no RWMA, Gr 1 DD, mild LAE, trivial pericardial eff post to heart  . History of echocardiogram    Echo 11/17: EF 55-60, no RWMA, Gr 1 DD, normal RV function.  Marland Kitchen History of palpitations    Holter 12/15: NSR, PACs  . [name] [name]   . Hypothyroidism   . Osteoarthritis    knees  . Positional headache since 1997   if lies on left side    Family History  Problem Relation Age of Onset  . Lung cancer Father 78       dies age 80  . Emphysema Father   . COPD Brother 37       died age 58  . Stroke Mother   . Hypertension Sister   . Thyroid disease Sister        Graves Disease  . Breast cancer Neg [name]     Social History   Socioeconomic History  . Marital status: Widowed    Spouse name: Not on file  . Number of children: Not on file  . Years of education: Not on file  . Highest education level: Not on file  Occupational History  . Occupation: Land    Employer: [employer]  Tobacco Use  . Smoking status: Never Smoker  .  Smokeless tobacco: Never Used  Vaping Use  . Vaping Use: Never used  Substance and Sexual Activity  . Alcohol use: Yes    Alcohol/week: 4.0 standard drinks    Types: 4 Glasses of wine per week  . Drug use: No  . Sexual activity: Yes    Partners: Male    Birth control/protection: Post-menopausal  Other Topics Concern  . Not on file  Social History Narrative   Widowed late 2019 after 40+ years of marriage   3 sons one daughter   Working as a Company secretary for [employer]   No/never tobacco no drug use 1 caffeinated beverage daily, occasional or rare glass of wine   Social Determinants of  Health   Financial Resource Strain: Not on file  Food Insecurity: Not on file  Transportation Needs: Not on file  Physical Activity: Not on file  Stress: Not on file  Social Connections: Not on file  Intimate Partner Violence: Not on file    Past Medical History, Surgical history, Social history, and Family history were reviewed and updated as appropriate.   Please see review of systems for further details on the patient's review from today.   Objective:   Physical Exam:  LMP 12/12/2005   Physical Exam Constitutional:      General: She is not in acute distress.    Appearance: She is well-developed. She is obese.  Musculoskeletal:        General: No deformity.  Neurological:     Mental Status: She is alert and oriented to person, place, and time.     Cranial Nerves: No dysarthria.     Coordination: Coordination normal.  Psychiatric:        Attention and Perception: Perception normal. She is attentive. She does not perceive auditory or visual hallucinations.        Mood and Affect: Mood is anxious and depressed. Affect is tearful. Affect is not labile, blunt, angry or inappropriate.        Speech: Speech normal. Speech is not slurred.        Behavior: Behavior normal. Behavior is not slowed. Behavior is cooperative.        Thought Content: Thought content normal. Thought  content is not paranoid or delusional. Thought content does not include homicidal or suicidal ideation. Thought content does not include homicidal or suicidal plan.        Cognition and Memory: Cognition and memory normal.     Comments: Affect more labile. Fair insight and judgment   Gained 100# while [name] [name] sick.  Lab Review:     Component Value Date/Time   NA 138 08/27/2020 1126   K 3.4 (L) 08/27/2020 1126   CL 98 08/27/2020 1126   CO2 29 08/27/2020 1126   GLUCOSE 119 ([name] [name]) 08/27/2020 1126   BUN 19 08/27/2020 1126   CREATININE 0.93 08/27/2020 1126   CALCIUM 9.7 08/27/2020 1126   PROT 6.9 08/27/2020 1126   ALBUMIN 4.4 02/05/2015 0850   AST 17 08/27/2020 1126   ALT 25 08/27/2020 1126   ALKPHOS 53 02/05/2015 0850   BILITOT 0.5 08/27/2020 1126   GFRNONAA 65 08/27/2020 1126   GFRAA 75 08/27/2020 1126       Component Value Date/Time   WBC 8.5 08/27/2020 1126   RBC 4.60 08/27/2020 1126   HGB 14.4 08/27/2020 1126   HCT 42.6 08/27/2020 1126   PLT 293 08/27/2020 1126   MCV 92.6 08/27/2020 1126   MCH 31.3 08/27/2020 1126   MCHC 33.8 08/27/2020 1126   RDW 12.5 08/27/2020 1126   LYMPHSABS 1,148 08/27/2020 1126   MONOABS 0.6 03/23/2018 1244   EOSABS 34 08/27/2020 1126   BASOSABS 17 08/27/2020 1126    No results found for: POCLITH, LITHIUM   No results found for: PHENYTOIN, PHENOBARB, VALPROATE, CBMZ   .res Assessment: Plan:    Maloni was seen today for follow-up, depression and anxiety.  Diagnoses and all orders for this visit:  Severe episode of recurrent major depressive disorder, without psychotic features ([place]) -     risperiDONE (RISPERDAL) 1 MG tablet; Take 1 tablet (1 mg total) by mouth 2 (two) times daily as needed (severe crying and anxiety).  Complicated grief  Panic disorder  with agoraphobia -     risperiDONE (RISPERDAL) 1 MG tablet; Take 1 tablet (1 mg total) by mouth 2 (two) times daily as needed (severe crying and anxiety).  Generalized anxiety  disorder  Insomnia due to mental condition -     traZODone (DESYREL) 50 MG tablet; Take 1 tablet (50 mg total) by mouth at bedtime.  Pseudobulbar affect -     risperiDONE (RISPERDAL) 1 MG tablet; Take 1 tablet (1 mg total) by mouth 2 (two) times daily as needed (severe crying and anxiety).  Obstructive sleep apnea     Greater than 50% of 30 minface to face time with patient was spent on counseling and coordination of care. We discussed Disc the difference between depression and grief.  Her [name] [name] would not talk about his cancer and wouldn't prepare her for his death. Supportive and grief work.  Rec therapeutic letter writing as she is still having a problem. Her panic disorder is generally controlled but more anxious somewhat.. She is still dealing with a lot of grief with waves of depression and anxiety and at times easily overwhelmed but functional.  Disc importance of staying busy to help with depression and anxiety  Nuedexta markedly helpful for crying until the last visit it started up again.  Reduce Duloxetine to  90 mg bc increase didn't help after 4 weeks and the crying may be worse related to NE.  Risperidone 1 mg BID prn    Few options for crying at this point except antipsychotics.  She refuses haloperidol.  High potency are more likely to help.  Other option is risperidone but disc weight gain.  Suggest risperidone.  Discussed the possibility this could be contributing to weight gain.  Realistically the only alternative would be fluoxetine and it is not as good of an anxiety med.  Her anxiety has been severe in the past.  I would advise against changing or reducing the dose at this time because she has recently had a little more depression and anxiety than baseline.  She is not using the lorazepam excessively.  She is taking it appropriately.  The diazepam is prescribed vaginally by her gynecologist for chronic pelvic pain and not being used. We discussed the short-term risks  associated with benzodiazepines including sedation and increased fall risk among others.  Discussed long-term side effect risk including dependence, potential withdrawal symptoms, and the potential eventual dose-related risk of dementia.  Disc the risk of Ambien amnesia.  Prefer avoid stimulant weight loss meds per PCP.  Naltrexone vs topiramate.  She tolerated topiramate for a while in the past for migraine.  Topiramate continue 75 mg BID.   To help weight and anxiety off label. She has been successful losing weight on this.  It also is used for HA.   Naltrexone or metformin or Ozempic  are other options.  Pain is better but not gone and in therapy.  Treated for OSA by Dr. Baird Lyons.  Started Needed counseling.  She commits.  If retires disc the importance of picking good Medicare [name] [name] plan that will cover the Nudexta in case it's needed. Also needs to continue social contact.  Weekly grief counselor is good and close to her.  This appt was 30 mins.  FU 4 weeks  Lynder Parents, MD, DFAPA  Please see After Visit Summary for patient specific instructions.    Future Appointments  Date Time Provider [place]  12/28/2020  4:00 PM Cottle, Billey Co., MD CP-CP None  No orders of the defined types were placed in this encounter.     -------------------------------

## 2020-11-26 MED FILL — TOPIRAMATE 25 MG TABLET: 25 | 30 days supply | Qty: 180 | Fill #1

## 2020-11-27 ENCOUNTER — Other Ambulatory Visit: Payer: Self-pay | Admitting: Psychiatry

## 2020-11-27 ENCOUNTER — Telehealth: Payer: Self-pay | Admitting: Psychiatry

## 2020-11-27 NOTE — Telephone Encounter (Signed)
[name] [name]'s next visit is 12/28/20. She just got her mail in [place] for [name] and it says on the script to stop the medicine. She is wanting to confirm if she still continues to take it or not? Her number is [phone].

## 2020-11-27 NOTE — Telephone Encounter (Signed)
Please call her and tell her to continue the Nudexta twice daily.

## 2020-11-30 ENCOUNTER — Telehealth: Payer: Self-pay | Admitting: Psychiatry

## 2020-11-30 NOTE — Telephone Encounter (Signed)
Please tell her yes.  Ok to take Risperidone and Nuedexta bc doses of both are very small

## 2020-11-30 NOTE — Telephone Encounter (Signed)
Error

## 2020-11-30 NOTE — Telephone Encounter (Signed)
Patient called wanting to know if its safe for her to take her Risperidone with her Nudexta?

## 2020-12-02 ENCOUNTER — Telehealth (INDEPENDENT_AMBULATORY_CARE_PROVIDER_SITE_OTHER): Payer: Self-pay

## 2020-12-02 ENCOUNTER — Other Ambulatory Visit (INDEPENDENT_AMBULATORY_CARE_PROVIDER_SITE_OTHER): Payer: Self-pay | Admitting: Internal Medicine

## 2020-12-02 DIAGNOSIS — F411 Generalized anxiety disorder: Secondary | ICD-10-CM | POA: Diagnosis not present

## 2020-12-02 DIAGNOSIS — J4540 Moderate persistent asthma, uncomplicated: Secondary | ICD-10-CM

## 2020-12-02 DIAGNOSIS — Z634 Disappearance and death of family member: Secondary | ICD-10-CM | POA: Diagnosis not present

## 2020-12-02 DIAGNOSIS — J3089 Other allergic rhinitis: Secondary | ICD-10-CM

## 2020-12-02 DIAGNOSIS — F331 Major depressive disorder, recurrent, moderate: Secondary | ICD-10-CM | POA: Diagnosis not present

## 2020-12-02 DIAGNOSIS — I10 Essential (primary) hypertension: Secondary | ICD-10-CM

## 2020-12-02 DIAGNOSIS — J302 Other seasonal allergic rhinitis: Secondary | ICD-10-CM

## 2020-12-02 DIAGNOSIS — E559 Vitamin D deficiency, unspecified: Secondary | ICD-10-CM

## 2020-12-02 MED ORDER — NEBIVOLOL HCL 10 MG PO TABS: 10.0000 mg | ORAL_TABLET | Freq: Two times a day (BID) | ORAL | 0 refills | Status: DC

## 2020-12-02 MED FILL — NEBIVOLOL HCL 10 MG TABS: 10 | 90 days supply | Qty: 180 | Fill #0

## 2020-12-02 NOTE — Telephone Encounter (Signed)
Received a fax from [facility] for a refill request of the following medication:  nebivolol (BYSTOLIC) 10 MG tablet  Last filled 08/28/2020, # 180 with 0 refills  Last OV 10/13/2020

## 2020-12-03 ENCOUNTER — Other Ambulatory Visit (INDEPENDENT_AMBULATORY_CARE_PROVIDER_SITE_OTHER): Payer: Self-pay

## 2020-12-03 DIAGNOSIS — E559 Vitamin D deficiency, unspecified: Secondary | ICD-10-CM

## 2020-12-03 DIAGNOSIS — J4540 Moderate persistent asthma, uncomplicated: Secondary | ICD-10-CM

## 2020-12-03 DIAGNOSIS — J302 Other seasonal allergic rhinitis: Secondary | ICD-10-CM

## 2020-12-03 DIAGNOSIS — I10 Essential (primary) hypertension: Secondary | ICD-10-CM

## 2020-12-03 MED ORDER — NEBIVOLOL HCL 10 MG PO TABS: 10.0000 mg | ORAL_TABLET | Freq: Two times a day (BID) | ORAL | 0 refills | Status: DC

## 2020-12-08 ENCOUNTER — Other Ambulatory Visit (INDEPENDENT_AMBULATORY_CARE_PROVIDER_SITE_OTHER): Payer: Self-pay | Admitting: Nurse Practitioner

## 2020-12-08 ENCOUNTER — Other Ambulatory Visit (INDEPENDENT_AMBULATORY_CARE_PROVIDER_SITE_OTHER): Payer: Self-pay | Admitting: Internal Medicine

## 2020-12-08 ENCOUNTER — Telehealth (INDEPENDENT_AMBULATORY_CARE_PROVIDER_SITE_OTHER): Payer: Self-pay

## 2020-12-08 DIAGNOSIS — E559 Vitamin D deficiency, unspecified: Secondary | ICD-10-CM

## 2020-12-08 DIAGNOSIS — I10 Essential (primary) hypertension: Secondary | ICD-10-CM

## 2020-12-08 DIAGNOSIS — J4540 Moderate persistent asthma, uncomplicated: Secondary | ICD-10-CM

## 2020-12-08 DIAGNOSIS — J302 Other seasonal allergic rhinitis: Secondary | ICD-10-CM

## 2020-12-08 MED ORDER — AMLODIPINE BESYLATE 5 MG PO TABS: 5.0000 mg | ORAL_TABLET | Freq: Every day | ORAL | 0 refills | Status: DC

## 2020-12-08 MED FILL — AMLODIPINE BESYLATE 5 MG TA: 5 | 90 days supply | Qty: 90 | Fill #0

## 2020-12-08 MED FILL — PANTOPRAZOLE SOD DR 40 MG T: 40 | 30 days supply | Qty: 60 | Fill #1

## 2020-12-08 NOTE — Telephone Encounter (Signed)
Received fax from [facility] pharmacy requesting a refill of the current medication:  amLODipine (NORVASC) 5 MG tablet  Last filled 08/28/2020, # 90 with 0 refills  Last OV 10/13/2020  Next OV 12/15/2020

## 2020-12-09 DIAGNOSIS — F331 Major depressive disorder, recurrent, moderate: Secondary | ICD-10-CM | POA: Diagnosis not present

## 2020-12-09 DIAGNOSIS — Z634 Disappearance and death of family member: Secondary | ICD-10-CM | POA: Diagnosis not present

## 2020-12-09 DIAGNOSIS — F411 Generalized anxiety disorder: Secondary | ICD-10-CM | POA: Diagnosis not present

## 2020-12-15 ENCOUNTER — Telehealth (INDEPENDENT_AMBULATORY_CARE_PROVIDER_SITE_OTHER): Payer: 59 | Admitting: Internal Medicine

## 2020-12-15 ENCOUNTER — Encounter (INDEPENDENT_AMBULATORY_CARE_PROVIDER_SITE_OTHER): Payer: Self-pay | Admitting: Internal Medicine

## 2020-12-15 DIAGNOSIS — F4381 Prolonged grief disorder: Secondary | ICD-10-CM

## 2020-12-15 DIAGNOSIS — F419 Anxiety disorder, unspecified: Secondary | ICD-10-CM

## 2020-12-15 DIAGNOSIS — F4329 Adjustment disorder with other symptoms: Secondary | ICD-10-CM

## 2020-12-15 NOTE — Progress Notes (Signed)
Metrics: Intervention Frequency ACO  Documented Smoking Status Yearly  Screened one or more times in 24 months  Cessation Counseling or  Active cessation medication Past 24 months  Past 24 months   Guideline developer: UpToDate (See UpToDate for funding source) Date Released: 2014       Wellness Office Visit  Subjective:  Patient ID: [name] [name], female    DOB: 05/25/1956  Age: 65 y.o. MRN: [id number]  CC: This is an audio telemedicine visit with the permission of the patient who is at home and I am at work.  I used 2 identifiers to identify the patient.  HPI The visit was originally set so that she would tell me that she is retiring and was going to be moving away to another physician in [place].  However, her plans have changed now and she is still staying with this practice.  She has no other symptoms to complain of that are acute.  Past Medical History:  Diagnosis Date  . Allergy   . Anxiety   . Bronchiectasis   . Bronchitis    chronic  . 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 COPD (chronic obstructive pulmonary disease) ([place])   . Dental crown present    upper  . Depression   . Environmental allergies    receives allergy shots  . GERD (gastroesophageal reflux disease)   . 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 - no current med.  . Glucose intolerance (impaired glucose tolerance)    on Metformin  . H/O hiatal hernia    "sliding"  . History of echocardiogram    Echo 1/16: EF 60-65, no RWMA, Gr 1 DD, mild LAE, trivial pericardial eff post to heart  . History of echocardiogram    Echo 11/17: EF 55-60, no RWMA, Gr 1 DD, normal RV function.  Marland Kitchen History of palpitations    [place] 12/15: NSR, PACs  . Hx of migraines   . Hypothyroidism   . Osteoarthritis    knees  . Positional headache since 1997   if lies on left side   Past Surgical History:  Procedure Laterality Date  . CHOLECYSTECTOMY  04/21/2011  . CHONDROPLASTY  10/19/2012   Procedure:  CHONDROPLASTY;  Surgeon: Yvette Rack., MD;  Location: [facility];  Service: Orthopedics;  Laterality: Right;  . COLONOSCOPY    . KNEE ARTHROSCOPY  10/19/2012   Procedure: ARTHROSCOPY KNEE;  Surgeon: Yvette Rack., MD;  Location: [facility];  Service: Orthopedics;  Laterality: Right;  . KNEE ARTHROSCOPY Right 02/01/2013   Procedure: RIGHT KNEE ARTHROSCOPY WITH MEDIAL MENISCECTOMY, DEBRIDEMENT CHONDROMALACIA PATELLA;  Surgeon: Yvette Rack., MD;  Location: [facility];  Service: Orthopedics;  Laterality: Right;  RIGHT KNEE ARTHROSCOPY WITH MEDIAL MENISCECTOMY, DEBRIDEMENT CHONDROMALACIA PATELLA  . KNEE ARTHROSCOPY Left 03/15/2013   Procedure: LEFT KNEE ARTHROSCOPY WITH DEBRIDEMENT/SHAVING CHONDROPLASTY WITH MEDIAL MENISECTOMY;  Surgeon: Yvette Rack., MD;  Location: [facility];  Service: Orthopedics;  Laterality: Left;  . KNEE ARTHROSCOPY WITH LATERAL MENISECTOMY  10/19/2012   Procedure: KNEE ARTHROSCOPY WITH LATERAL MENISECTOMY;  Surgeon: Yvette Rack., MD;  Location: [facility];  Service: Orthopedics;  Laterality: Right;  . TONSILLECTOMY AND ADENOIDECTOMY     age 67  . TUBAL LIGATION  1986  . WISDOM TOOTH EXTRACTION       Family History  Problem Relation Age of Onset  . Lung cancer Father 71  dies age 35  . Emphysema Father   . COPD Brother 53       died age 48  . Stroke Mother   . Hypertension Sister   . Thyroid disease Sister        Graves Disease  . Breast cancer Neg Hx     Social History   Social History Narrative   Widowed late 2019 after 40+ years of marriage   3 sons one daughter   Working as a Company secretary for [employer]   No/never tobacco no drug use 1 caffeinated beverage daily, occasional or rare glass of wine   Social History   Tobacco Use  . Smoking status: Never Smoker  . Smokeless tobacco: Never Used  Substance Use Topics  . Alcohol use: Yes    Alcohol/week:  4.0 standard drinks    Types: 4 Glasses of wine per week    Current Meds  Medication Sig  . albuterol (PROAIR HFA) 108 (90 Base) MCG/ACT inhaler Inhale 2 puffs into the lungs every 6 (six) hours as needed for wheezing or shortness of breath. Use as needed only  if your can't catch your breath  . amLODipine (NORVASC) 5 MG tablet TAKE 1 TABLET BY MOUTH DAILY  . amLODipine (NORVASC) 5 MG tablet Take 1 tablet (5 mg total) by mouth daily.  . B Complex Vitamins (B-COMPLEX/B-12) LIQD Place 5 mLs under the tongue daily at 2 PM daily at 2 PM.  . busPIRone (BUSPAR) 7.5 MG tablet Take 1 tablet (7.5 mg total) by mouth 3 (three) times daily.  . Cholecalciferol (VITAMIN D) 125 MCG (5000 UT) CAPS Take 5,000 Units by mouth daily.   Marland Kitchen Dextromethorphan-quiNIDine (NUEDEXTA) 20-10 MG capsule Take 1 capsule by mouth 2 (two) times daily.  . DULoxetine (CYMBALTA) 30 MG capsule Take 3 capsules (90 mg total) by mouth daily. (Patient taking differently: Take 120 mg by mouth daily.)  . estradiol (ESTRACE) 0.5 MG tablet TAKE 1 TABLET BY MOUTH ONCE DAILY  . fexofenadine (ALLEGRA) 180 MG tablet Take 180 mg by mouth daily.  Marland Kitchen gabapentin (NEURONTIN) 800 MG tablet Take 1 tablet (800 mg total) by mouth 4 (four) times daily.  . hydrochlorothiazide (MICROZIDE) 12.5 MG capsule Take 1 capsule (12.5 mg total) by mouth daily.  Marland Kitchen LORazepam (ATIVAN) 1 MG tablet Take 1 tablet (1 mg total) by mouth every 6 (six) hours as needed.  . mometasone (NASONEX) 50 MCG/ACT nasal spray Place 2 sprays into the nose daily.  . montelukast (SINGULAIR) 10 MG tablet Take 1 tablet (10 mg total) by mouth daily.  . nebivolol (BYSTOLIC) 10 MG tablet Take 1 tablet (10 mg total) by mouth 2 (two) times daily.  . pantoprazole (PROTONIX) 40 MG tablet Take 1 tablet (40 mg total) by mouth 2 (two) times daily.  . progesterone (PROMETRIUM) 100 MG capsule Take 1 capsule (100 mg total) by mouth daily.  . risperiDONE (RISPERDAL) 1 MG tablet Take 1 tablet (1 mg total)  by mouth 2 (two) times daily as needed (severe crying and anxiety).  Marland Kitchen thyroid (NP THYROID) 60 MG tablet Take 2 tablets (120 mg total) by mouth daily.  Marland Kitchen topiramate (TOPAMAX) 25 MG tablet TAKE 3 TABLETS BY MOUTH TWICE DAILY  . Travoprost, BAK Free, (TRAVATAN) 0.004 % SOLN ophthalmic solution Place 1 drop into both eyes at bedtime.  . traZODone (DESYREL) 50 MG tablet Take 1 tablet (50 mg total) by mouth at bedtime.  Marland Kitchen zolpidem (AMBIEN) 10 MG tablet TAKE 1/2 TO 1  TABLET BY MOUTH AT BEDTIME AS NEEDED      Depression screen [facility] 2/9 10/13/2020 02/27/2018  Decreased Interest 3 0  Down, Depressed, Hopeless 3 0  PHQ - 2 Score 6 0  Altered sleeping 2 -  Tired, decreased energy 2 -  Change in appetite 1 -  Feeling bad or failure about yourself  2 -  Trouble concentrating 3 -  Moving slowly or fidgety/restless 3 -  Suicidal thoughts 0 -  PHQ-9 Score 19 -  Difficult doing work/chores Extremely dIfficult -  Some recent data might be hidden     Objective:   Today's Vitals: LMP 12/12/2005  Vitals with BMI 12/15/2020 10/13/2020 08/27/2020  Height (No Data) 5\' 1"  -  Weight (No Data) 182 lbs 192 lbs  BMI - 34.41 -  Systolic (No Data) 133 123  Diastolic (No Data) 70 78  Pulse - 71 73     Physical Exam  Virtual visit.     Assessment   1. Anxiety   2. Grief reaction with prolonged bereavement       Tests ordered No orders of the defined types were placed in this encounter.    Plan: 1. No new changes to medications or any recommendations.   No orders of the defined types were placed in this encounter.   , MD

## 2020-12-16 MED FILL — ZOLPIDEM TARTRATE 10 MG TAB: 10 | 30 days supply | Qty: 30 | Fill #4

## 2020-12-24 MED FILL — NP THYROID 60 MG TABLET: 60 | 30 days supply | Qty: 60 | Fill #0

## 2020-12-25 ENCOUNTER — Telehealth: Payer: Self-pay | Admitting: Psychiatry

## 2020-12-25 NOTE — Telephone Encounter (Signed)
[name] [name] is apparently applying for Pt. Assistance for her [place].  She is using [employer].  Form was completed and mailed to them 12/25/20

## 2020-12-26 ENCOUNTER — Other Ambulatory Visit: Payer: Self-pay | Admitting: Psychiatry

## 2020-12-26 DIAGNOSIS — F4001 Agoraphobia with panic disorder: Secondary | ICD-10-CM

## 2020-12-26 DIAGNOSIS — F411 Generalized anxiety disorder: Secondary | ICD-10-CM

## 2020-12-28 ENCOUNTER — Telehealth (INDEPENDENT_AMBULATORY_CARE_PROVIDER_SITE_OTHER): Payer: 59 | Admitting: Psychiatry

## 2020-12-28 ENCOUNTER — Encounter: Payer: Self-pay | Admitting: Psychiatry

## 2020-12-28 ENCOUNTER — Other Ambulatory Visit: Payer: Self-pay | Admitting: Psychiatry

## 2020-12-28 DIAGNOSIS — F4001 Agoraphobia with panic disorder: Secondary | ICD-10-CM

## 2020-12-28 DIAGNOSIS — F4321 Adjustment disorder with depressed mood: Secondary | ICD-10-CM

## 2020-12-28 DIAGNOSIS — F5105 Insomnia due to other mental disorder: Secondary | ICD-10-CM

## 2020-12-28 DIAGNOSIS — F482 Pseudobulbar affect: Secondary | ICD-10-CM

## 2020-12-28 DIAGNOSIS — F332 Major depressive disorder, recurrent severe without psychotic features: Secondary | ICD-10-CM

## 2020-12-28 DIAGNOSIS — F411 Generalized anxiety disorder: Secondary | ICD-10-CM

## 2020-12-28 DIAGNOSIS — G4733 Obstructive sleep apnea (adult) (pediatric): Secondary | ICD-10-CM

## 2020-12-28 MED ORDER — TRAZODONE HCL 50 MG PO TABS: 50.0000 mg | ORAL_TABLET | Freq: Every day | ORAL | 0 refills | Status: DC

## 2020-12-28 MED ORDER — LORAZEPAM 1 MG PO TABS: 1.0000 mg | ORAL_TABLET | Freq: Four times a day (QID) | ORAL | 1 refills | Status: DC | PRN

## 2020-12-28 MED ORDER — NUEDEXTA 20-10 MG PO CAPS: 1.0000 | ORAL_CAPSULE | Freq: Two times a day (BID) | ORAL | 2 refills | Status: DC

## 2020-12-28 MED ORDER — DULOXETINE HCL 30 MG PO CPEP: 90.0000 mg | ORAL_CAPSULE | Freq: Every day | ORAL | 0 refills | Status: DC

## 2020-12-28 NOTE — Telephone Encounter (Signed)
She has apt this afternoon 

## 2020-12-28 NOTE — Progress Notes (Signed)
[name] [name] [id number] 1956/01/07 65 y.o.  Video Visit via My Chart  I connected with pt by My Chart and verified that I am speaking with the correct person using two identifiers.   I discussed the limitations, risks, security and privacy concerns of performing an evaluation and management service by My Chart  and the availability of in person appointments. I also discussed with the patient that there may be a patient responsible charge related to this service. The patient expressed understanding and agreed to proceed.  I discussed the assessment and treatment plan with the patient. The patient was provided an opportunity to ask questions and all were answered. The patient agreed with the plan and demonstrated an understanding of the instructions.   The patient was advised to call back or seek an in-person evaluation if the symptoms worsen or if the condition fails to improve as anticipated.  I provided 30 minutes of video time during this encounter.  The patient was located at son's home in [facility] and the provider was located home DT weather.  Session started at 4:00 and ended 4:30   Subjective:   Patient ID:  [name] [name] is a 65 y.o. (DOB March 26, 1956) female.  Chief Complaint:  Chief Complaint  Patient presents with  . Follow-up  . Anxiety  . Depression  . Panic Attack    Depression        Associated symptoms include fatigue and myalgias.  Associated symptoms include no decreased concentration and no suicidal ideas. Medication Refill Associated symptoms include fatigue and myalgias. Pertinent negatives include no chest pain or weakness.   [name] [name] presents to the office today for follow-up of anxiety.  seen April 2020.  She was dealing with grief and depression and sertraline was increased back to 200 mg daily.  [name] [name] died in 06-Oct-2018  from cancer.  Married 41 years.  Is able to focus on positive memories.  Not ready to move.  seen August 2020.  No meds  were changed.  She continued sertraline 200 mg daily.  seen January 2020.  Sertraline was unchanged.  She was having still grief issues related to the loss of her husband.  She was also having problems with weight attributed to the sertraline.  She was given a trial of topiramate to try to help offset some of the weight gain plus it can have some potential antianxiety effects.  seen 02/19/20 stating: Very depressed. So sad.  Don't want to get OOB.  Got lost coming here and late.  Worse for 3-4 weeks.  Days off are terrible.  Barely able to function at work.  Cry all the way home after work.  So lonely.  Kids aren't coming like they were bc they are busy.  Need to figure out her own life.  Life was centered around H and kids.  Denies suicidal thoughts  Made following med changes: Increase sertraline back to 300 mg daily.  She's aware it's higher than the expected usual dose. Rexulti 2 mg daily Topiramate increase to target 50 mg BID and possibly higher for obesity.  03/18/2020 appointment the following is noted: No meds were changed. She didn't increase sertraline bc fear of weight gain.  Quite a bit different.  No crying.  Sad a few times.  Better in 5 days.  Feels much less depressed.  Better energy, interest, enjoyment.  Also weather helping and started planting.  More motivation.  Desperate to lose weight.  Has some chronic pain  and taking gabapentin.   Started gym cycle and lost some weight.   Worried over D with depression and conflict with pt.  D was always closer to father than her.  D distanced from her and recent bad outburst with her.  D recently blocked communication with her.   Disc getting her help.  05/19/2020 appointment with the following noted: Tried to self medicate bc felt good on Rexulti and tried reducing sertraline to 150 for 2 weeks and got worse.  So increased back to 100 mg BID. Stopped cable and got too bored for 3 mos.  Restarted cable yesterday.   Still grieving.  Kids want  her to sell the house but she can't. Anxiety in the morning fidgety.  Taken more Ativan lately with more panic and worry over the future.  Almost like too much caffeine before any.  Limits it. Better if busy. Does better with working.  Was too sleepy with progesterone. Successful losing weight on the topiramate. Assessment/Plan: Her panic disorder is generally controlled but more anxious somewhat..She is still dealing with a lot of grief with waves of depression and anxiety and at times easily overwhelmed but functional.   trial Rexulti 3 mg daily for 2 weeks to see if can gain more benefit for depression and anxiety. If no benefit then reduce Rexulti back to 2 mg.  She'll call  06/01/2020 phone call reporting that 3 mg Rexulti seem to be helping and she wanted to continue it. 06/03/2020, 2 days later she called back stating it was making her too sleepy.  She was planning a trip and she was encouraged to stop the medicine until she returns from her trip and we would reevaluate. 06/11/2020 phone call stating after stopping the Rexulti she was said, depressed and crying all the time and wanted to return to Rexulti at 1 mg daily to which that was agreed. 07/02/2020 she called back wanting to milligram tablets of Rexulti because she had increased it on her own to that dosage and felt better.  She also had stopped drinking wine in the evening which she felt like helped.  So at her request a prescription was submitted for 2 mg Rexulti to the pharmacy. 07/16/2020 phone call stating she was very lonely and wanted to go back up to 3 mg of Rexulti.  That request was refused because of her previous side effects and making frequent changes with a med with a very long half-life like Rexulti is not a good idea.  07/21/2020 appointment with the following noted: Pretty desperate to do something. She increased Rexulti AMA to 3 mg despite being asked not to do so. Still sleepy and anxious.  Stopped wine for several  weeks Ativan 1 -2 mg daily at work bc very anxious. Yesterday when son was there she wasn't sleepy or depressed until he left. Denies drowsiness when driving. Feels no better with grief. Plan: Stop Rexulti DT sleepiness.   Start Latuda 20 mg daily but she refuses bc doesn't think she can eat enough with it.  Then says she'll try it.  Topiramate She would like to increase further; OK increase to 75 mg BID.  No therapy since husband died. Needs counseling desperately.  She commits.  09/22/2020 appointment with the following noted Multiple phone calls since the last visit in crisis. Tried Latuda up to 40 mg a day with 3 mg Ativan daily.  She felt like it made her worse and stopped it. Recommended haloperidol 2 mg tablets 1-2 nightly  due to extreme unmanageable anxiety and fearfulness.   08/18/2020 telephone call with the following noted:Tried haloperidol 1 mg and didn't help anything.  No effect except a little sleepiness.  Took it sparingly bc afraid of it.  Afraid of tremors and TD.  I didn't to give it a good chance. Strongly rec ECT.  Currently will consider and agrees to go to [facility] for consult.  I'm scared.  Scared of meds too. Currently on sertraline 200 mg daily. Plan: Reduce sertraline to 150 daily and start duloxetine 30 mg capsule daily for 5 days, Then reduce sertraline to 100 mg and increase duloxetine to 2 capsules daily for 5 days, Then reduce sertraline to 1/2 of 100 mg aily and increase duloxetine to 90 mg daily for 5 days then stop sertraline and continue duloxetine 90 mg daily. Disc SE.  09/09/2020 extended phone call in crisis with nurse.  She was still transitioning from sertraline to duloxetine and trazodone was added for sleep. She's refusing ECT consult with [facility] and didn't go. She has found a new therapist and seen her several times and she's local.  She is hopeful it might help. On duloxetine 90 mg since about 09/02/20. Never took buspirone but did receive  it. Takes Ativan. Taking topiramate 75 mg in AM and on 50 mg PM.  Never increased to 75 mg BID. Takes Ambien initially and trazodone 25 mg HS and sleep not as good with duloxetine vs Zoloft.  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  Before that was sobbing and often not staying with herself. FMLA filled out for accomodation for leave if crying spells are unmanageable. Used to call H when she got in the car and son offered to have her call but he's less available.  So often cries in the car but onlly one bad crying spell after Nudexta. Hx migraine tx by neurologist who said MRI showed spots suggesting head injury.  Pt never remembers history of head injury. 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 Tomorrow is her and H's birthday and having family party tonight. Anniversary of 2 years is 10/25 and doing better about staying alone in her house. Uncertain mood effect from change to duloxetine so far but thinks maybe she's a little better. Doesn't tolerate being alone very well.  Worry about winter. Plan continue Nuedexta was helpful for crying spells though it did not resolve them Continue duloxetine 90 mg to give it more time to work as it is only been about 3 weeks Okay per her request to increase topiramate to 75 mg twice daily.  10/26/2020 appointment with the following noted: Takes Ativan in morning.  Drinks more caffeine at home than work.  Might cause some anxiety.  But anxious at work too over insecurity and takes Ativan in morning also.  Takes it to drive. Average 2-3 Ativan daily.  Less anxious if not alone. On duloxetine 90 for 7 weeks. Crying spells still better on Nudexta than not.  Better than last time. Duloxetine is helping with depression and anxiety both are better than last visit.  Dep 5/10.  Anxiety  6/10 bc of work and driving.  Friends notice the benefit also. Not a winter person and dreads  it. Not much appetite in evening but eats good lunch at work and breakfast.  Topiramate has curbed her appetite.   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   Work stressful and hates driving including in the winter  and doesn't think she can do it anymore.   Sleep about 5 hours and sometimes can't get back to sleep on trazodone 50 and Ambien and Ativan but doesn't think she can take more trazodone DT hangover.  No amnesia.  Plan: Nuedexta markedly helpful for crying but hasn't resolved it. Duloxetine 90 on 7 weeks with benefit..  Increase dulxetine to max to see if can get more benefit bc sig residual sx. 120 mg daily.  11/25/2020 appointment with the following noted: Hard time. Since here.  Started crying a lot again.  Has been consistent with Nudexta.  Felt good for 3-4 days then last night had neg interaction with daughter while in men's section of [employer].  Got triggered over seeing men's clothes and D got mad.  She was crying and got a disoriented and had trouble finding her way out.  She doesn't understand her grief.  Crying for weeks and then briefly better and recurred. Plan to retire Feb but scared to stay by herself.  Work is only socialization she has and it's place she can enjoy things.   Never stayed alone before. I can't stay by myself.  Not afraid but lonely.  No hobbies. Plan: Nuedexta markedly helpful for crying until the last visit it started up again. Reduce Duloxetine to  90 mg bc increase didn't help after 4 weeks and the crying may be worse related to NE. Risperidone 1 mg BID prn   12/28/2020 appointment with the following noted: Was mainly taking gabapentin for pain and it's better sor reduced gabapentin to 2 daily for months.  Forgets to take it at work. Anxiety all day long. Risperidone prn bad crying day only once and it helped.  No SE Crying a lot since here when she is alone at home or in car.  Does not do it at work. Painful cry.  Sad and lonely.  Grief cry.   More anxious  driving than she used to be. Using Ativan 1 mg average 2 daily.  Past Psychiatric Medication Trials:  Zoloft 300, Lexapro,, paroxetine 80 in 2017, Duloxetine 120 Nudexta helped crying for awhile until increase duloxetine and holidays Paliperidone, olanzapine, aripiprazole 20 mg, buspirone, Rexulti 3 mg sleepy clonidine, topiramate, gabapentin, metformin,  phentermine,  trazodone, Ambien, Ativan,  Remote history topiramate for migraine 1998, [place] helped lose 85#  Review of Systems:  Review of Systems  Constitutional: Positive for fatigue. Negative for unexpected weight change.       Sweating  Respiratory: Negative for shortness of breath.   Cardiovascular: Negative for chest pain and palpitations.  Gastrointestinal: Negative for rectal pain.  Genitourinary: Negative for pelvic pain.  Musculoskeletal: Positive for myalgias.  Neurological: Negative for dizziness, tremors and weakness.  Psychiatric/Behavioral: Positive for depression and dysphoric mood. Negative for agitation, behavioral problems, confusion, decreased concentration, hallucinations, self-injury, sleep disturbance and suicidal ideas. The patient is nervous/anxious. The patient is not hyperactive.     Medications: I have reviewed the patient's current medications.  Current Outpatient Medications  Medication Sig Dispense Refill  . albuterol (PROAIR HFA) 108 (90 Base) MCG/ACT inhaler Inhale 2 puffs into the lungs every 6 (six) hours as needed for wheezing or shortness of breath. Use as needed only  if your can't catch your breath 18 g 3  . amLODipine (NORVASC) 5 MG tablet TAKE 1 TABLET BY MOUTH DAILY 90 tablet 0  . amLODipine (NORVASC) 5 MG tablet Take 1 tablet (5 mg total) by mouth daily. 90 tablet 0  . B Complex  Vitamins (B-COMPLEX/B-12) LIQD Place 5 mLs under the tongue daily at 2 PM daily at 2 PM.    . Cholecalciferol (VITAMIN D) 125 MCG (5000 UT) CAPS Take 5,000 Units by mouth daily.     Marland Kitchen estradiol (ESTRACE) 0.5 MG  tablet TAKE 1 TABLET BY MOUTH ONCE DAILY 30 tablet 3  . fexofenadine (ALLEGRA) 180 MG tablet Take 180 mg by mouth daily.    Marland Kitchen gabapentin (NEURONTIN) 800 MG tablet Take 1 tablet (800 mg total) by mouth 4 (four) times daily. (Patient taking differently: Take 800 mg by mouth 4 (four) times daily. Usually just am) 120 tablet 1  . hydrochlorothiazide (MICROZIDE) 12.5 MG capsule Take 1 capsule (12.5 mg total) by mouth daily. 90 capsule 0  . mometasone (NASONEX) 50 MCG/ACT nasal spray Place 2 sprays into the nose daily. 17 g 3  . montelukast (SINGULAIR) 10 MG tablet Take 1 tablet (10 mg total) by mouth daily. 90 tablet 0  . nebivolol (BYSTOLIC) 10 MG tablet Take 1 tablet (10 mg total) by mouth 2 (two) times daily. 180 tablet 0  . pantoprazole (PROTONIX) 40 MG tablet Take 1 tablet (40 mg total) by mouth 2 (two) times daily. 60 tablet 2  . progesterone (PROMETRIUM) 100 MG capsule Take 1 capsule (100 mg total) by mouth daily. 90 capsule 0  . risperiDONE (RISPERDAL) 1 MG tablet Take 1 tablet (1 mg total) by mouth 2 (two) times daily as needed (severe crying and anxiety). 30 tablet 1  . thyroid (NP THYROID) 60 MG tablet Take 2 tablets (120 mg total) by mouth daily. 60 tablet 1  . topiramate (TOPAMAX) 25 MG tablet TAKE 3 TABLETS BY MOUTH TWICE DAILY 180 tablet 1  . Travoprost, BAK Free, (TRAVATAN) 0.004 % SOLN ophthalmic solution Place 1 drop into both eyes at bedtime.    Marland Kitchen zolpidem (AMBIEN) 10 MG tablet TAKE 1/2 TO 1 TABLET BY MOUTH AT BEDTIME AS NEEDED 30 tablet 5  . busPIRone (BUSPAR) 7.5 MG tablet Take 1 tablet (7.5 mg total) by mouth 3 (three) times daily. (Patient not taking: Reported on 12/28/2020) 90 tablet 1  . Dextromethorphan-quiNIDine (NUEDEXTA) 20-10 MG capsule Take 1 capsule by mouth 2 (two) times daily. 60 capsule 2  . DULoxetine (CYMBALTA) 30 MG capsule Take 3 capsules (90 mg total) by mouth daily. 270 capsule 0  . LORazepam (ATIVAN) 1 MG tablet Take 1 tablet (1 mg total) by mouth every 6 (six)  hours as needed. 60 tablet 1  . traZODone (DESYREL) 50 MG tablet Take 1 tablet (50 mg total) by mouth at bedtime. 90 tablet 0   No current facility-administered medications for this visit.    Medication Side Effects: Other: ? sweating related.  Allergies:  Allergies  Allergen Reactions  . Levaquin [Levofloxacin] Other (See Comments)    MUSCLE PAIN  . Nickel Rash    Past Medical History:  Diagnosis Date  . Allergy   . Anxiety   . Bronchiectasis   . Bronchitis    chronic  . 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 COPD (chronic obstructive pulmonary disease) ([place])   . Dental crown present    upper  . Depression   . Environmental allergies    receives allergy shots  . GERD (gastroesophageal reflux disease)   . 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 - no current med.  . Glucose intolerance (impaired glucose tolerance)    on Metformin  . H/O hiatal hernia    "sliding"  .  History of echocardiogram    Echo 1/16: EF 60-65, no RWMA, Gr 1 DD, mild LAE, trivial pericardial eff post to heart  . History of echocardiogram    Echo 11/17: EF 55-60, no RWMA, Gr 1 DD, normal RV function.  Marland Kitchen History of palpitations    Holter 12/15: NSR, PACs  . Hx of migraines   . Hypothyroidism   . Osteoarthritis    knees  . Positional headache since 1997   if lies on left side    Family History  Problem Relation Age of Onset  . Lung cancer Father 71       dies age 42  . Emphysema Father   . COPD Brother 58       died age 27  . Stroke Mother   . Hypertension Sister   . Thyroid disease Sister        Graves Disease  . Breast cancer Neg Hx     Social History   Socioeconomic History  . Marital status: Widowed    Spouse name: Not on file  . Number of children: Not on file  . Years of education: Not on file  . Highest education level: Not on file  Occupational History  . Occupation: Land    Employer: [employer]  Tobacco Use  . Smoking status: Never Smoker  .  Smokeless tobacco: Never Used  Vaping Use  . Vaping Use: Never used  Substance and Sexual Activity  . Alcohol use: Yes    Alcohol/week: 4.0 standard drinks    Types: 4 Glasses of wine per week  . Drug use: No  . Sexual activity: Yes    Partners: Male    Birth control/protection: Post-menopausal  Other Topics Concern  . Not on file  Social History Narrative   Widowed late 2019 after 40+ years of marriage   3 sons one daughter   Working as a Company secretary for [employer]   No/never tobacco no drug use 1 caffeinated beverage daily, occasional or rare glass of wine   Social Determinants of Radio broadcast assistant Strain: Not on file  Food Insecurity: Not on file  Transportation Needs: Not on file  Physical Activity: Not on file  Stress: Not on file  Social Connections: Not on file  Intimate Partner Violence: Not on file    Past Medical History, Surgical history, Social history, and Family history were reviewed and updated as appropriate.   M history of Lewey Body Dementia  Please see review of systems for further details on the patient's review from today.   Objective:   Physical Exam:  LMP 12/12/2005   Physical Exam Neurological:     Mental Status: She is alert and oriented to person, place, and time.     Cranial Nerves: No dysarthria.  Psychiatric:        Attention and Perception: Attention and perception normal.        Mood and Affect: Mood is anxious and depressed. Affect is labile.        Speech: Speech normal.        Behavior: Behavior is cooperative.        Thought Content: Thought content normal. Thought content is not paranoid or delusional. Thought content does not include homicidal or suicidal ideation. Thought content does not include homicidal or suicidal plan.        Cognition and Memory: Cognition and memory normal.        Judgment: Judgment normal.  Comments: Insight intact   Gained 100# while H sick.  Lab Review:      Component Value Date/Time   NA 138 08/27/2020 1126   K 3.4 (L) 08/27/2020 1126   CL 98 08/27/2020 1126   CO2 29 08/27/2020 1126   GLUCOSE 119 (H) 08/27/2020 1126   BUN 19 08/27/2020 1126   CREATININE 0.93 08/27/2020 1126   CALCIUM 9.7 08/27/2020 1126   PROT 6.9 08/27/2020 1126   ALBUMIN 4.4 02/05/2015 0850   AST 17 08/27/2020 1126   ALT 25 08/27/2020 1126   ALKPHOS 53 02/05/2015 0850   BILITOT 0.5 08/27/2020 1126   GFRNONAA 65 08/27/2020 1126   GFRAA 75 08/27/2020 1126       Component Value Date/Time   WBC 8.5 08/27/2020 1126   RBC 4.60 08/27/2020 1126   HGB 14.4 08/27/2020 1126   HCT 42.6 08/27/2020 1126   PLT 293 08/27/2020 1126   MCV 92.6 08/27/2020 1126   MCH 31.3 08/27/2020 1126   MCHC 33.8 08/27/2020 1126   RDW 12.5 08/27/2020 1126   LYMPHSABS 1,148 08/27/2020 1126   MONOABS 0.6 03/23/2018 1244   EOSABS 34 08/27/2020 1126   BASOSABS 17 08/27/2020 1126    No results found for: POCLITH, LITHIUM   No results found for: PHENYTOIN, PHENOBARB, VALPROATE, CBMZ   .res Assessment: Plan:    [name] [name] was seen today for follow-up, anxiety, depression and panic attack.  Diagnoses and all orders for this visit:  Severe episode of recurrent major depressive disorder, without psychotic features ([place]) -     DULoxetine (CYMBALTA) 30 MG capsule; Take 3 capsules (90 mg total) by mouth daily.  Complicated grief  Panic disorder with agoraphobia -     LORazepam (ATIVAN) 1 MG tablet; Take 1 tablet (1 mg total) by mouth every 6 (six) hours as needed.  Generalized anxiety disorder  Insomnia due to mental condition -     traZODone (DESYREL) 50 MG tablet; Take 1 tablet (50 mg total) by mouth at bedtime.  Pseudobulbar affect -     Dextromethorphan-quiNIDine (NUEDEXTA) 20-10 MG capsule; Take 1 capsule by mouth 2 (two) times daily.  Obstructive sleep apnea      History of ministrokes  Greater than 50% of 30 minface to face time with patient was spent on counseling and  coordination of care. We discussed Disc the difference between depression and grief.  Her H would not talk about his cancer and wouldn't prepare her for his death. Supportive and grief work.  Rec therapeutic letter writing as she is still having a problem. Her panic disorder is generally controlled but more anxious somewhat.. She is still dealing with a lot of grief with waves of depression and anxiety and at times easily overwhelmed but functional.  Disc importance of staying busy to help with depression and anxiety  Nuedexta markedly helpful for crying until the last visit it crying worsened again.  Continue Duloxetine  90 mg bc increase didn't help after 4 weeks and the crying may be worse related to NE.  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  If fails after a week reduce back to current dose and instead increase Risperidone 1 mg BID.    Few options for crying at this point except antipsychotics.  She refuses haloperidol.  High potency are more likely to help.  Other option is risperidone but disc weight gain.  Suggest risperidone.  Crying worse when alone.  Disc options moving closer to  family or friends.  Discussed the possibility this could be contributing to weight gain.  Realistically the only alternative would be fluoxetine and it is not as good of an anxiety med.  Her anxiety has been severe in the past.  I would advise against changing or reducing the dose at this time because she has recently had a little more depression and anxiety than baseline.  She is not using the lorazepam excessively.  She is taking it appropriately.  The diazepam is prescribed vaginally by her gynecologist for chronic pelvic pain and not being used. We discussed the short-term risks associated with benzodiazepines including sedation and increased fall risk among others.  Discussed long-term side effect risk including dependence, potential withdrawal symptoms, and the potential eventual  dose-related risk of dementia.  Disc the risk of Ambien amnesia.  Prefer avoid stimulant weight loss meds per PCP.  Naltrexone vs topiramate.  She tolerated topiramate for a while in the past for migraine.  Option busipirone  Topiramate continue 75 mg BID.   To help weight and anxiety off label. She has been successful losing weight on this.  It also is used for HA.   Naltrexone or metformin or Ozempic  are other options.  Pain is better but not gone and in therapy.  Treated for OSA by Dr. Baird Lyons.  Started Needed counseling.  She commits.  If retires disc the importance of picking good Medicare D plan that will cover the Nudexta in case it's needed. Also needs to continue social contact.  Weekly grief counselor is good and close to her.  This appt was 30 mins.  FU 4 weeks  [name] Parents, MD, [facility]  Please see After Visit Summary for patient specific instructions.    Future Appointments  Date Time Provider [place]  01/27/2021  3:45 PM Cottle, Billey Co., MD [facility] None    No orders of the defined types were placed in this encounter.     -------------------------------

## 2020-12-29 ENCOUNTER — Other Ambulatory Visit: Payer: Self-pay | Admitting: Psychiatry

## 2020-12-29 MED FILL — traZODone HCL 50 MG TABS: 50 | 90 days supply | Qty: 90 | Fill #0

## 2020-12-29 MED FILL — LORAZEPAM 1 MG TABS: 1 | 15 days supply | Qty: 60 | Fill #0

## 2020-12-29 MED FILL — TOPIRAMATE 25 MG TABLET: 25 | 30 days supply | Qty: 180 | Fill #0

## 2020-12-29 MED FILL — NUEDEXTA 20-10 MG CAPSULE: 20-10 | 30 days supply | Qty: 60 | Fill #0

## 2020-12-30 ENCOUNTER — Other Ambulatory Visit: Payer: Self-pay | Admitting: Psychiatry

## 2020-12-31 MED FILL — MOMETASONE FUROATE 50 MCG S: 50 | 30 days supply | Qty: 17 | Fill #2

## 2021-01-01 MED FILL — DULoxetine HCL 30 MG CPEP: 30 | 90 days supply | Qty: 270 | Fill #0

## 2021-01-05 MED FILL — PANTOPRAZOLE SOD DR 40 MG T: 40 | 30 days supply | Qty: 60 | Fill #2

## 2021-01-15 MED FILL — ZOLPIDEM TARTRATE 10 MG TAB: 10 | 30 days supply | Qty: 30 | Fill #5

## 2021-01-20 DIAGNOSIS — Z634 Disappearance and death of family member: Secondary | ICD-10-CM | POA: Diagnosis not present

## 2021-01-20 DIAGNOSIS — F331 Major depressive disorder, recurrent, moderate: Secondary | ICD-10-CM | POA: Diagnosis not present

## 2021-01-20 DIAGNOSIS — F411 Generalized anxiety disorder: Secondary | ICD-10-CM | POA: Diagnosis not present

## 2021-01-27 ENCOUNTER — Other Ambulatory Visit: Payer: Self-pay

## 2021-01-27 ENCOUNTER — Other Ambulatory Visit: Payer: Self-pay | Admitting: Psychiatry

## 2021-01-27 ENCOUNTER — Ambulatory Visit (INDEPENDENT_AMBULATORY_CARE_PROVIDER_SITE_OTHER): Payer: 59 | Admitting: Psychiatry

## 2021-01-27 ENCOUNTER — Encounter: Payer: Self-pay | Admitting: Psychiatry

## 2021-01-27 DIAGNOSIS — F482 Pseudobulbar affect: Secondary | ICD-10-CM

## 2021-01-27 DIAGNOSIS — F411 Generalized anxiety disorder: Secondary | ICD-10-CM

## 2021-01-27 DIAGNOSIS — F4321 Adjustment disorder with depressed mood: Secondary | ICD-10-CM

## 2021-01-27 DIAGNOSIS — G4733 Obstructive sleep apnea (adult) (pediatric): Secondary | ICD-10-CM | POA: Diagnosis not present

## 2021-01-27 DIAGNOSIS — F332 Major depressive disorder, recurrent severe without psychotic features: Secondary | ICD-10-CM | POA: Diagnosis not present

## 2021-01-27 DIAGNOSIS — F4001 Agoraphobia with panic disorder: Secondary | ICD-10-CM | POA: Diagnosis not present

## 2021-01-27 DIAGNOSIS — F5105 Insomnia due to other mental disorder: Secondary | ICD-10-CM | POA: Diagnosis not present

## 2021-01-27 MED ORDER — TOPIRAMATE 100 MG PO TABS
100.0000 mg | ORAL_TABLET | Freq: Two times a day (BID) | ORAL | 0 refills | Status: DC
Start: 2021-01-27 — End: 2021-01-27

## 2021-01-27 MED ORDER — IMIPRAMINE HCL 25 MG PO TABS
ORAL_TABLET | ORAL | 1 refills | Status: DC
Start: 2021-01-27 — End: 2021-01-27

## 2021-01-27 MED FILL — TOPIRAMATE 100 MG TABLET: 100 | 90 days supply | Qty: 180 | Fill #0

## 2021-01-27 MED FILL — NP THYROID 60 MG TABLET: 60 | 30 days supply | Qty: 60 | Fill #1

## 2021-01-27 MED FILL — IMIPRAMINE HCL 25 MG TABLET: 25 | 30 days supply | Qty: 90 | Fill #0

## 2021-01-27 NOTE — Patient Instructions (Addendum)
Reduce duloxetine to 60 mg daily. Start imipramine 1 of the 25 mg tablets nightly for 1 week,  then 2 nightly for 1 week,  then 3 at night for 1 week. Wait 1 week and get the blood test, preferably in the morning.  Increase topiramate to 100 mg twice daily.

## 2021-01-27 NOTE — Progress Notes (Signed)
SCOTTLYNN LINDELL [id number] 1956/02/08 65 y.o.   Subjective:   Patient ID:  [name] [name] is a 65 y.o. (DOB [birth year]) female.  Chief Complaint:  Chief Complaint  Patient presents with  . Follow-up  . Severe episode of recurrent major depressive disorder, with  . Depression  . Anxiety    Depression        Associated symptoms include fatigue and myalgias.  Associated symptoms include no decreased concentration and no suicidal ideas. Medication Refill Associated symptoms include fatigue and myalgias. Pertinent negatives include no chest pain or weakness.   LIA VIGILANTE presents to the office today for follow-up of anxiety.  seen April 2020.  She was dealing with grief and depression and sertraline was increased back to 200 mg daily.  [name] died in 2018-10-25  from cancer.  Married 41 years.  Is able to focus on positive memories.  Not ready to move.  seen August 2020.  No meds were changed.  She continued sertraline 200 mg daily.  seen January 2020.  Sertraline was unchanged.  She was having still grief issues related to the loss of her husband.  She was also having problems with weight attributed to the sertraline.  She was given a trial of topiramate to try to help offset some of the weight gain plus it can have some potential antianxiety effects.  seen 02/19/20 stating: Very depressed. So sad.  Don't want to get OOB.  Got lost coming here and late.  Worse for 3-4 weeks.  Days off are terrible.  Barely able to function at work.  Cry all the way home after work.  So lonely.  Kids aren't coming like they were bc they are busy.  Need to figure out her own life.  Life was centered around H and kids.  Denies suicidal thoughts  Made following med changes: Increase sertraline back to 300 mg daily.  She's aware it's higher than the expected usual dose. Rexulti 2 mg daily Topiramate increase to target 50 mg BID and possibly higher for obesity.  03/18/2020 appointment the  following is noted: No meds were changed. She didn't increase sertraline bc fear of weight gain.  Quite a bit different.  No crying.  Sad a few times.  Better in 5 days.  Feels much less depressed.  Better energy, interest, enjoyment.  Also weather helping and started planting.  More motivation.  Desperate to lose weight.  Has some chronic pain and taking gabapentin.   Started gym cycle and lost some weight.   Worried over D with depression and conflict with pt.  D was always closer to father than her.  D distanced from her and recent bad outburst with her.  D recently blocked communication with her.   Disc getting her help.  05/19/2020 appointment with the following noted: Tried to self medicate bc felt good on Rexulti and tried reducing sertraline to 150 for 2 weeks and got worse.  So increased back to 100 mg BID. Stopped cable and got too bored for 3 mos.  Restarted cable yesterday.   Still grieving.  Kids want her to sell the house but she can't. Anxiety in the morning fidgety.  Taken more Ativan lately with more panic and worry over the future.  Almost like too much caffeine before any.  Limits it. Better if busy. Does better with working.  Was too sleepy with progesterone. Successful losing weight on the topiramate. Assessment/Plan: Her panic disorder is generally controlled but more  anxious somewhat..She is still dealing with a lot of grief with waves of depression and anxiety and at times easily overwhelmed but functional.   trial Rexulti 3 mg daily for 2 weeks to see if can gain more benefit for depression and anxiety. If no benefit then reduce Rexulti back to 2 mg.  She'll call  06/01/2020 phone call reporting that 3 mg Rexulti seem to be helping and she wanted to continue it. 06/03/2020, 2 days later she called back stating it was making her too sleepy.  She was planning a trip and she was encouraged to stop the medicine until she returns from her trip and we would reevaluate. 06/11/2020 phone  call stating after stopping the Rexulti she was said, depressed and crying all the time and wanted to return to Rexulti at 1 mg daily to which that was agreed. 07/02/2020 she called back wanting to milligram tablets of Rexulti because she had increased it on her own to that dosage and felt better.  She also had stopped drinking wine in the evening which she felt like helped.  So at her request a prescription was submitted for 2 mg Rexulti to the pharmacy. 07/16/2020 phone call stating she was very lonely and wanted to go back up to 3 mg of Rexulti.  That request was refused because of her previous side effects and making frequent changes with a med with a very long half-life like Rexulti is not a good idea.  07/21/2020 appointment with the following noted: Pretty desperate to do something. She increased Rexulti AMA to 3 mg despite being asked not to do so. Still sleepy and anxious.  Stopped wine for several weeks Ativan 1 -2 mg daily at work bc very anxious. Yesterday when son was there she wasn't sleepy or depressed until he left. Denies drowsiness when driving. Feels no better with grief. Plan: Stop Rexulti DT sleepiness.   Start Latuda 20 mg daily but she refuses bc doesn't think she can eat enough with it.  Then says she'll try it.  Topiramate She would like to increase further; OK increase to 75 mg BID.  No therapy since husband died. Needs counseling desperately.  She commits.  09/22/2020 appointment with the following noted Multiple phone calls since the last visit in crisis. Tried Latuda up to 40 mg a day with 3 mg Ativan daily.  She felt like it made her worse and stopped it. Recommended haloperidol 2 mg tablets 1-2 nightly due to extreme unmanageable anxiety and fearfulness.   08/18/2020 telephone call with the following noted:Tried haloperidol 1 mg and didn't help anything.  No effect except a little sleepiness.  Took it sparingly bc afraid of it.  Afraid of tremors and TD.  I didn't to  give it a good chance. Strongly rec ECT.  Currently will consider and agrees to go to [facility] for consult.  I'm scared.  Scared of meds too. Currently on sertraline 200 mg daily. Plan: Reduce sertraline to 150 daily and start duloxetine 30 mg capsule daily for 5 days, Then reduce sertraline to 100 mg and increase duloxetine to 2 capsules daily for 5 days, Then reduce sertraline to 1/2 of 100 mg aily and increase duloxetine to 90 mg daily for 5 days then stop sertraline and continue duloxetine 90 mg daily. Disc SE.  09/09/2020 extended phone call in crisis with nurse.  She was still transitioning from sertraline to duloxetine and trazodone was added for sleep. She's refusing ECT consult with [facility] and didn't  go. She has found a new therapist and seen her several times and she's local.  She is hopeful it might help. On duloxetine 90 mg since about 09/02/20. Never took buspirone but did receive it. Takes Ativan. Taking topiramate 75 mg in AM and on 50 mg PM.  Never increased to 75 mg BID. Takes Ambien initially and trazodone 25 mg HS and sleep not as good with duloxetine vs Zoloft.  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  Before that was sobbing and often not staying with herself. FMLA filled out for accomodation for leave if crying spells are unmanageable. Used to call H when she got in the car and son offered to have her call but he's less available.  So often cries in the car but onlly one bad crying spell after Nudexta. Hx migraine tx by neurologist who said MRI showed spots suggesting head injury.  Pt never remembers history of head injury. 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 Tomorrow is her and H's birthday and having family party tonight. Anniversary of 2 years is 10/25 and doing better about staying alone in her house. Uncertain mood effect from change to duloxetine so far but thinks maybe she's a  little better. Doesn't tolerate being alone very well.  Worry about winter. Plan continue Nuedexta was helpful for crying spells though it did not resolve them Continue duloxetine 90 mg to give it more time to work as it is only been about 3 weeks Okay per her request to increase topiramate to 75 mg twice daily.  10/26/2020 appointment with the following noted: Takes Ativan in morning.  Drinks more caffeine at home than work.  Might cause some anxiety.  But anxious at work too over insecurity and takes Ativan in morning also.  Takes it to drive. Average 2-3 Ativan daily.  Less anxious if not alone. On duloxetine 90 for 7 weeks. Crying spells still better on Nudexta than not.  Better than last time. Duloxetine is helping with depression and anxiety both are better than last visit.  Dep 5/10.  Anxiety  6/10 bc of work and driving.  Friends notice the benefit also. Not a winter person and dreads it. Not much appetite in evening but eats good lunch at work and breakfast.  Topiramate has curbed her appetite.   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   Work stressful and hates driving including in the winter and doesn't think she can do it anymore.   Sleep about 5 hours and sometimes can't get back to sleep on trazodone 50 and Ambien and Ativan but doesn't think she can take more trazodone DT hangover.  No amnesia.  Plan: Nuedexta markedly helpful for crying but hasn't resolved it. Duloxetine 90 on 7 weeks with benefit..  Increase dulxetine to max to see if can get more benefit bc sig residual sx. 120 mg daily.  11/25/2020 appointment with the following noted: Hard time. Since here.  Started crying a lot again.  Has been consistent with Nudexta.  Felt good for 3-4 days then last night had neg interaction with daughter while in men's section of [employer].  Got triggered over seeing men's clothes and D got mad.  She was crying and got a disoriented and had trouble finding her way out.  She doesn't understand  her grief.  Crying for weeks and then briefly better and recurred. Plan to retire Feb but scared to stay by herself.  Work is only  socialization she has and it's place she can enjoy things.   Never stayed alone before. I can't stay by myself.  Not afraid but lonely.  No hobbies. Plan: Nuedexta markedly helpful for crying until the last visit it started up again. Reduce Duloxetine to  90 mg bc increase didn't help after 4 weeks and the crying may be worse related to NE. Risperidone 1 mg BID prn   12/28/2020 appointment with the following noted: Was mainly taking gabapentin for pain and it's better sor reduced gabapentin to 2 daily for months.  Forgets to take it at work. Anxiety all day long. Risperidone prn bad crying day only once and it helped.  No SE Crying a lot since here when she is alone at home or in car.  Does not do it at work. Painful cry.  Sad and lonely.  Grief cry.   More anxious driving than she used to be. Using Ativan 1 mg average 2 daily.  Plan: Nuedexta markedly helpful for crying until the last visit it crying worsened again. Continue Duloxetine  90 mg bc increase didn't help after 4 weeks and the crying may be worse related to NE. 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  If fails after a week reduce back to current dose and instead increase Risperidone 1 mg BID.   01/27/2021 appt with following noted: Never  Remembered to take gabapentin QID but could take BID.  No SE. Since here more sad than anxious.  No panic lately. Still crying spells.  Only took risperidone once.  Most of crying in AM and off work at home alone. Is losing weight with topiramate and would like to go higher. Can feel alright when with family.  Past Psychiatric Medication Trials:  Zoloft 300, Lexapro,, paroxetine 80 in 2017, Duloxetine 120 Nudexta helped crying for awhile until increase duloxetine and holidays Paliperidone, olanzapine, aripiprazole 20 mg, buspirone, Rexulti  3 mg sleepy clonidine, topiramate, gabapentin, metformin,  phentermine,  trazodone, Ambien, Ativan,  Remote history topiramate for migraine 1998, [place] helped lose 85#  Review of Systems:  Review of Systems  Constitutional: Positive for fatigue. Negative for unexpected weight change.       Sweating  Respiratory: Negative for shortness of breath.   Cardiovascular: Negative for chest pain and palpitations.  Gastrointestinal: Negative for rectal pain.  Genitourinary: Negative for pelvic pain.  Musculoskeletal: Positive for myalgias.  Neurological: Negative for dizziness, tremors and weakness.  Psychiatric/Behavioral: Positive for depression and dysphoric mood. Negative for agitation, behavioral problems, confusion, decreased concentration, hallucinations, self-injury, sleep disturbance and suicidal ideas. The patient is nervous/anxious. The patient is not hyperactive.     Medications: I have reviewed the patient's current medications.  Current Outpatient Medications  Medication Sig Dispense Refill  . albuterol (PROAIR HFA) 108 (90 Base) MCG/ACT inhaler Inhale 2 puffs into the lungs every 6 (six) hours as needed for wheezing or shortness of breath. Use as needed only  if your can't catch your breath 18 g 3  . amLODipine (NORVASC) 5 MG tablet TAKE 1 TABLET BY MOUTH DAILY 90 tablet 0  . amLODipine (NORVASC) 5 MG tablet Take 1 tablet (5 mg total) by mouth daily. 90 tablet 0  . B Complex Vitamins (B-COMPLEX/B-12) LIQD Place 5 mLs under the tongue daily at 2 PM daily at 2 PM.    . Dextromethorphan-quiNIDine (NUEDEXTA) 20-10 MG capsule Take 1 capsule by mouth 2 (two) times daily. 60 capsule 2  . DULoxetine (CYMBALTA)  30 MG capsule Take 3 capsules (90 mg total) by mouth daily. 270 capsule 0  . fexofenadine (ALLEGRA) 180 MG tablet Take 180 mg by mouth daily.    Marland Kitchen gabapentin (NEURONTIN) 800 MG tablet Take 1 tablet (800 mg total) by mouth 4 (four) times daily. (Patient taking differently: Take 800  mg by mouth 4 (four) times daily. Usually just am) 120 tablet 1  . hydrochlorothiazide (MICROZIDE) 12.5 MG capsule Take 1 capsule (12.5 mg total) by mouth daily. 90 capsule 0  . imipramine (TOFRANIL) 25 MG tablet 1 at night for 1 week, then 2 at night for 1 week, then 3 at night 90 tablet 1  . LORazepam (ATIVAN) 1 MG tablet Take 1 tablet (1 mg total) by mouth every 6 (six) hours as needed. (Patient taking differently: Take 1 mg by mouth every 6 (six) hours as needed. 1-2 tabs daily.) 60 tablet 1  . mometasone (NASONEX) 50 MCG/ACT nasal spray Place 2 sprays into the nose daily. 17 g 3  . montelukast (SINGULAIR) 10 MG tablet Take 1 tablet (10 mg total) by mouth daily. 90 tablet 0  . nebivolol (BYSTOLIC) 10 MG tablet Take 1 tablet (10 mg total) by mouth 2 (two) times daily. 180 tablet 0  . pantoprazole (PROTONIX) 40 MG tablet Take 1 tablet (40 mg total) by mouth 2 (two) times daily. 60 tablet 2  . thyroid (NP THYROID) 60 MG tablet Take 2 tablets (120 mg total) by mouth daily. 60 tablet 1  . Travoprost, BAK Free, (TRAVATAN) 0.004 % SOLN ophthalmic solution Place 1 drop into both eyes at bedtime.    . traZODone (DESYREL) 50 MG tablet Take 1 tablet (50 mg total) by mouth at bedtime. 90 tablet 0  . zolpidem (AMBIEN) 10 MG tablet TAKE 1/2 TO 1 TABLET BY MOUTH AT BEDTIME AS NEEDED 30 tablet 5  . busPIRone (BUSPAR) 7.5 MG tablet Take 1 tablet (7.5 mg total) by mouth 3 (three) times daily. (Patient not taking: No sig reported) 90 tablet 1  . Cholecalciferol (VITAMIN D) 125 MCG (5000 UT) CAPS Take 5,000 Units by mouth daily.  (Patient not taking: Reported on 01/27/2021)    . estradiol (ESTRACE) 0.5 MG tablet TAKE 1 TABLET BY MOUTH ONCE DAILY (Patient not taking: Reported on 01/27/2021) 30 tablet 3  . progesterone (PROMETRIUM) 100 MG capsule Take 1 capsule (100 mg total) by mouth daily. (Patient not taking: Reported on 01/27/2021) 90 capsule 0  . risperiDONE (RISPERDAL) 1 MG tablet Take 1 tablet (1 mg total) by  mouth 2 (two) times daily as needed (severe crying and anxiety). (Patient not taking: Reported on 01/27/2021) 30 tablet 1  . topiramate (TOPAMAX) 100 MG tablet Take 1 tablet (100 mg total) by mouth 2 (two) times daily. 180 tablet 0   No current facility-administered medications for this visit.    Medication Side Effects: Other: ? sweating related.  Allergies:  Allergies  Allergen Reactions  . Levaquin [Levofloxacin] Other (See Comments)    MUSCLE PAIN  . Nickel Rash    Past Medical History:  Diagnosis Date  . Allergy   . Anxiety   . Bronchiectasis   . Bronchitis    chronic  . 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 COPD (chronic obstructive pulmonary disease) ([place])   . Dental crown present    upper  . Depression   . Environmental allergies    receives allergy shots  . GERD (gastroesophageal reflux disease)   . Glaucoma high risk  is monitored every 6 mos. - no current med.  . Glucose intolerance (impaired glucose tolerance)    on Metformin  . H/O hiatal hernia    "sliding"  . History of echocardiogram    Echo 1/16: EF 60-65, no RWMA, Gr 1 DD, mild LAE, trivial pericardial eff post to heart  . History of echocardiogram    Echo 11/17: EF 55-60, no RWMA, Gr 1 DD, normal RV function.  Marland Kitchen History of palpitations    Holter 12/15: NSR, PACs  . Hx of migraines   . Hypothyroidism   . Osteoarthritis    knees  . Positional headache since 1997   if lies on left side    Family History  Problem Relation Age of Onset  . Lung cancer Father 47       dies age 84  . Emphysema Father   . COPD Brother 23       died age 88  . Stroke Mother   . Hypertension Sister   . Thyroid disease Sister        Graves Disease  . Breast cancer Neg Hx     Social History   Socioeconomic History  . Marital status: Widowed    Spouse name: Not on file  . Number of children: Not on file  . Years of education: Not on file  . Highest education level: Not on file  Occupational History  .  Occupation: Land    Employer: [employer]  Tobacco Use  . Smoking status: Never Smoker  . Smokeless tobacco: Never Used  Vaping Use  . Vaping Use: Never used  Substance and Sexual Activity  . Alcohol use: Yes    Alcohol/week: 4.0 standard drinks    Types: 4 Glasses of wine per week  . Drug use: No  . Sexual activity: Yes    Partners: Male    Birth control/protection: Post-menopausal  Other Topics Concern  . Not on file  Social History Narrative   Widowed late 2019 after 40+ years of marriage   3 sons one daughter   Working as a Company secretary for [employer]   No/never tobacco no drug use 1 caffeinated beverage daily, occasional or rare glass of wine   Social Determinants of Radio broadcast assistant Strain: Not on file  Food Insecurity: Not on file  Transportation Needs: Not on file  Physical Activity: Not on file  Stress: Not on file  Social Connections: Not on file  Intimate Partner Violence: Not on file    Past Medical History, Surgical history, Social history, and Family history were reviewed and updated as appropriate.   M history of Lewey Body Dementia  Please see review of systems for further details on the patient's review from today.   Objective:   Physical Exam:  LMP 12/12/2005   Physical Exam Constitutional:      General: She is not in acute distress. Musculoskeletal:        General: No deformity.  Neurological:     Mental Status: She is alert and oriented to person, place, and time.     Cranial Nerves: No dysarthria.     Coordination: Coordination normal.  Psychiatric:        Attention and Perception: Attention and perception normal. She does not perceive auditory or visual hallucinations.        Mood and Affect: Mood is anxious and depressed. Affect is labile and tearful. Affect is not blunt, angry or inappropriate.  Speech: Speech normal.        Behavior: Behavior normal. Behavior is cooperative.        Thought  Content: Thought content normal. Thought content is not paranoid or delusional. Thought content does not include homicidal or suicidal ideation. Thought content does not include homicidal or suicidal plan.        Cognition and Memory: Cognition and memory normal.        Judgment: Judgment normal.     Comments: Insight intact   Gained 100# while H sick.  Lab Review:     Component Value Date/Time   NA 138 08/27/2020 1126   K 3.4 (L) 08/27/2020 1126   CL 98 08/27/2020 1126   CO2 29 08/27/2020 1126   GLUCOSE 119 (H) 08/27/2020 1126   BUN 19 08/27/2020 1126   CREATININE 0.93 08/27/2020 1126   CALCIUM 9.7 08/27/2020 1126   PROT 6.9 08/27/2020 1126   ALBUMIN 4.4 02/05/2015 0850   AST 17 08/27/2020 1126   ALT 25 08/27/2020 1126   ALKPHOS 53 02/05/2015 0850   BILITOT 0.5 08/27/2020 1126   GFRNONAA 65 08/27/2020 1126   GFRAA 75 08/27/2020 1126       Component Value Date/Time   WBC 8.5 08/27/2020 1126   RBC 4.60 08/27/2020 1126   HGB 14.4 08/27/2020 1126   HCT 42.6 08/27/2020 1126   PLT 293 08/27/2020 1126   MCV 92.6 08/27/2020 1126   MCH 31.3 08/27/2020 1126   MCHC 33.8 08/27/2020 1126   RDW 12.5 08/27/2020 1126   LYMPHSABS 1,148 08/27/2020 1126   MONOABS 0.6 03/23/2018 1244   EOSABS 34 08/27/2020 1126   BASOSABS 17 08/27/2020 1126    No results found for: POCLITH, LITHIUM   No results found for: PHENYTOIN, PHENOBARB, VALPROATE, CBMZ   .res Assessment: Plan:    Heavin was seen today for follow-up, severe episode of recurrent major depressive disorder, with, depression and anxiety.  Diagnoses and all orders for this visit:  Severe episode of recurrent major depressive disorder, without psychotic features ([place]) -     imipramine (TOFRANIL) 25 MG tablet; 1 at night for 1 week, then 2 at night for 1 week, then 3 at night  Complicated grief  Panic disorder with agoraphobia -     topiramate (TOPAMAX) 100 MG tablet; Take 1 tablet (100 mg total) by mouth 2 (two) times  daily. -     imipramine (TOFRANIL) 25 MG tablet; 1 at night for 1 week, then 2 at night for 1 week, then 3 at night  Insomnia due to mental condition  Generalized anxiety disorder -     topiramate (TOPAMAX) 100 MG tablet; Take 1 tablet (100 mg total) by mouth 2 (two) times daily. -     imipramine (TOFRANIL) 25 MG tablet; 1 at night for 1 week, then 2 at night for 1 week, then 3 at night  Pseudobulbar affect  Obstructive sleep apnea     History of ministrokes  Greater than 50% of 30 minface to face time with patient was spent on counseling and coordination of care. We discussed Disc the difference between depression and grief.   Her panic disorder is generally controlled but more anxious somewhat.. She is still dealing with a lot of grief with waves of depression and anxiety and at times easily overwhelmed but functional.  Disc importance of staying busy to help with depression and anxiety. Crying worse when alone.  Doesn't handle being alone well.  Disc options moving closer to  family or friends.  Nuedexta markedly helpful for crying until recent visit it crying worsened again.  conitnue it.  Continue gabapentin 800 BID if she can remember  for TR anxiety.     Few options for crying at this point except antipsychotics.  She refuses haloperidol.  High potency are more likely to help.  Other option is risperidone but disc weight gain.  Suggest risperidone.  Has not tried TCA.  Rec switch from duloxetine to imipramine and check level. Scared of any change, even the weather. Reduce duloxetine to 60 mg daily. Start imipramine 1 of the 25 mg tablets nightly for 1 week,  then 2 nightly for 1 week,  then 3 at night for 1 week. Wait 1 week and get the blood test, preferably in the morning.  Discussed the possibility this could be contributing to weight gain.  Realistically the only alternative would be fluoxetine and it is not as good of an anxiety med.  Her anxiety has been severe in the  past.  I would advise against changing or reducing the dose at this time because she has recently had a little more depression and anxiety than baseline.  She is not using the lorazepam excessively.  She is taking it appropriately.  The diazepam is prescribed vaginally by her gynecologist for chronic pelvic pain and not being used. We discussed the short-term risks associated with benzodiazepines including sedation and increased fall risk among others.  Discussed long-term side effect risk including dependence, potential withdrawal symptoms, and the potential eventual dose-related risk of dementia.  Disc the risk of Ambien amnesia.  Option busipirone  Topiramate OK to increase to 100 mg BID.   To help weight and anxiety off label.  It is helping with weight loss and she's tolerating it.  She has been successful losing weight on this.  It also is used for HA.   Naltrexone or metformin or Ozempic  are other options.  Pain is better but not gone and in therapy.  Treated for OSA by Dr. Baird Lyons.  Started Needed counseling.  She commits.  If retires disc the importance of picking good Medicare D plan that will cover the Nudexta in case it's needed. Also needs to continue social contact.  Weekly grief counselor is good and close to her.  This appt was 30 mins.  FU 4 weeks  Lynder Parents, MD, DFAPA  Please see After Visit Summary for patient specific instructions.    Future Appointments  Date Time Provider [place]  02/25/2021  4:00 PM Cottle, [place]ley Co., MD [facility] None    No orders of the defined types were placed in this encounter.     -------------------------------

## 2021-01-29 MED FILL — NUEDEXTA 20-10 MG CAPSULE: 20-10 | 30 days supply | Qty: 60 | Fill #1

## 2021-02-03 DIAGNOSIS — Z634 Disappearance and death of family member: Secondary | ICD-10-CM | POA: Diagnosis not present

## 2021-02-03 DIAGNOSIS — F331 Major depressive disorder, recurrent, moderate: Secondary | ICD-10-CM | POA: Diagnosis not present

## 2021-02-03 DIAGNOSIS — F411 Generalized anxiety disorder: Secondary | ICD-10-CM | POA: Diagnosis not present

## 2021-02-08 DIAGNOSIS — B029 Zoster without complications: Secondary | ICD-10-CM | POA: Diagnosis not present

## 2021-02-08 DIAGNOSIS — L03211 Cellulitis of face: Secondary | ICD-10-CM | POA: Diagnosis not present

## 2021-02-08 DIAGNOSIS — B0233 Zoster keratitis: Secondary | ICD-10-CM | POA: Diagnosis not present

## 2021-02-11 DIAGNOSIS — B001 Herpesviral vesicular dermatitis: Secondary | ICD-10-CM | POA: Diagnosis not present

## 2021-02-11 DIAGNOSIS — H0011 Chalazion right upper eyelid: Secondary | ICD-10-CM | POA: Diagnosis not present

## 2021-02-12 ENCOUNTER — Other Ambulatory Visit: Payer: Self-pay | Admitting: Psychiatry

## 2021-02-12 MED FILL — ZOLPIDEM TARTRATE 10 MG TAB: 10 | 30 days supply | Qty: 30 | Fill #0

## 2021-02-13 MED FILL — LORAZEPAM 1 MG TABS: 1 | 15 days supply | Qty: 60 | Fill #1

## 2021-02-13 MED FILL — PANTOPRAZOLE SOD DR 40 MG T: 40 | 90 days supply | Qty: 180 | Fill #0

## 2021-02-15 ENCOUNTER — Telehealth (INDEPENDENT_AMBULATORY_CARE_PROVIDER_SITE_OTHER): Payer: Self-pay

## 2021-02-15 ENCOUNTER — Other Ambulatory Visit (INDEPENDENT_AMBULATORY_CARE_PROVIDER_SITE_OTHER): Payer: Self-pay | Admitting: Internal Medicine

## 2021-02-15 DIAGNOSIS — J4540 Moderate persistent asthma, uncomplicated: Secondary | ICD-10-CM

## 2021-02-15 DIAGNOSIS — I10 Essential (primary) hypertension: Secondary | ICD-10-CM

## 2021-02-15 DIAGNOSIS — B001 Herpesviral vesicular dermatitis: Secondary | ICD-10-CM | POA: Diagnosis not present

## 2021-02-15 DIAGNOSIS — E559 Vitamin D deficiency, unspecified: Secondary | ICD-10-CM

## 2021-02-15 DIAGNOSIS — J3089 Other allergic rhinitis: Secondary | ICD-10-CM

## 2021-02-15 DIAGNOSIS — J302 Other seasonal allergic rhinitis: Secondary | ICD-10-CM

## 2021-02-15 MED ORDER — PANTOPRAZOLE SODIUM 40 MG PO TBEC: 40.0000 mg | DELAYED_RELEASE_TABLET | Freq: Two times a day (BID) | ORAL | 2 refills | Status: DC

## 2021-02-15 MED FILL — ERYTHROMYCIN EYE OINTMENT: 5 | 7 days supply | Qty: 4 | Fill #0

## 2021-02-15 NOTE — Telephone Encounter (Signed)
Received a fax from [facility] for a refill request of the following:  pantoprazole (PROTONIX) 40 MG tablet  Last filled 08/28/2020, # 60 with 2 refills  Last OV 12/15/2020 (Video Visit)

## 2021-02-17 DIAGNOSIS — F411 Generalized anxiety disorder: Secondary | ICD-10-CM | POA: Diagnosis not present

## 2021-02-17 DIAGNOSIS — F331 Major depressive disorder, recurrent, moderate: Secondary | ICD-10-CM | POA: Diagnosis not present

## 2021-02-17 DIAGNOSIS — Z634 Disappearance and death of family member: Secondary | ICD-10-CM | POA: Diagnosis not present

## 2021-02-23 DIAGNOSIS — H401134 Primary open-angle glaucoma, bilateral, indeterminate stage: Secondary | ICD-10-CM | POA: Diagnosis not present

## 2021-02-23 DIAGNOSIS — B001 Herpesviral vesicular dermatitis: Secondary | ICD-10-CM | POA: Diagnosis not present

## 2021-02-25 ENCOUNTER — Encounter: Payer: Self-pay | Admitting: Psychiatry

## 2021-02-25 ENCOUNTER — Other Ambulatory Visit: Payer: Self-pay

## 2021-02-25 ENCOUNTER — Ambulatory Visit (INDEPENDENT_AMBULATORY_CARE_PROVIDER_SITE_OTHER): Payer: 59 | Admitting: Psychiatry

## 2021-02-25 DIAGNOSIS — F4321 Adjustment disorder with depressed mood: Secondary | ICD-10-CM

## 2021-02-25 DIAGNOSIS — G4733 Obstructive sleep apnea (adult) (pediatric): Secondary | ICD-10-CM

## 2021-02-25 DIAGNOSIS — F5105 Insomnia due to other mental disorder: Secondary | ICD-10-CM | POA: Diagnosis not present

## 2021-02-25 DIAGNOSIS — F332 Major depressive disorder, recurrent severe without psychotic features: Secondary | ICD-10-CM

## 2021-02-25 DIAGNOSIS — F411 Generalized anxiety disorder: Secondary | ICD-10-CM | POA: Diagnosis not present

## 2021-02-25 DIAGNOSIS — F482 Pseudobulbar affect: Secondary | ICD-10-CM | POA: Diagnosis not present

## 2021-02-25 DIAGNOSIS — F4001 Agoraphobia with panic disorder: Secondary | ICD-10-CM | POA: Diagnosis not present

## 2021-02-25 NOTE — Progress Notes (Signed)
[name] [name] [id number] 1956-01-12 65 y.o.   Subjective:   Patient ID:  [name] [name] is a 65 y.o. (DOB 28-Mar-1956) female.  Chief Complaint:  Chief Complaint  Patient presents with  . Follow-up  . Severe episode of recurrent major depressive disorder, with  . Depression  . Anxiety    Depression        Associated symptoms include fatigue and myalgias.  Associated symptoms include no decreased concentration and no suicidal ideas. Medication Refill Associated symptoms include fatigue and myalgias. Pertinent negatives include no chest pain or weakness.   [name] [name] presents to the office today for follow-up of anxiety.  seen April 2020.  She was dealing with grief and depression and sertraline was increased back to 200 mg daily.  [name] [name] died in 21-Oct-2018  from cancer.  Married 41 years.  Is able to focus on positive memories.  Not ready to move.  seen August 2020.  No meds were changed.  She continued sertraline 200 mg daily.  seen January 2020.  Sertraline was unchanged.  She was having still grief issues related to the loss of her husband.  She was also having problems with weight attributed to the sertraline.  She was given a trial of topiramate to try to help offset some of the weight gain plus it can have some potential antianxiety effects.  seen 02/19/20 stating: Very depressed. So sad.  Don't want to get OOB.  Got lost coming here and late.  Worse for 3-4 weeks.  Days off are terrible.  Barely able to function at work.  Cry all the way home after work.  So lonely.  Kids aren't coming like they were bc they are busy.  Need to figure out her own life.  Life was centered around H and kids.  Denies suicidal thoughts  Made following med changes: Increase sertraline back to 300 mg daily.  She's aware it's higher than the expected usual dose. Rexulti 2 mg daily Topiramate increase to target 50 mg BID and possibly higher for obesity.  03/18/2020 appointment the  following is noted: No meds were changed. She didn't increase sertraline bc fear of weight gain.  Quite a bit different.  No crying.  Sad a few times.  Better in 5 days.  Feels much less depressed.  Better energy, interest, enjoyment.  Also weather helping and started planting.  More motivation.  Desperate to lose weight.  Has some chronic pain and taking gabapentin.   Started gym cycle and lost some weight.   Worried over D with depression and conflict with pt.  D was always closer to father than her.  D distanced from her and recent bad outburst with her.  D recently blocked communication with her.   Disc getting her help.  05/19/2020 appointment with the following noted: Tried to self medicate bc felt good on Rexulti and tried reducing sertraline to 150 for 2 weeks and got worse.  So increased back to 100 mg BID. Stopped cable and got too bored for 3 mos.  Restarted cable yesterday.   Still grieving.  Kids want her to sell the house but she can't. Anxiety in the morning fidgety.  Taken more Ativan lately with more panic and worry over the future.  Almost like too much caffeine before any.  Limits it. Better if busy. Does better with working.  Was too sleepy with progesterone. Successful losing weight on the topiramate. Assessment/Plan: Her panic disorder is generally controlled but more  anxious somewhat..She is still dealing with a lot of grief with waves of depression and anxiety and at times easily overwhelmed but functional.   trial Rexulti 3 mg daily for 2 weeks to see if can gain more benefit for depression and anxiety. If no benefit then reduce Rexulti back to 2 mg.  She'll call  06/01/2020 phone call reporting that 3 mg Rexulti seem to be helping and she wanted to continue it. 06/03/2020, 2 days later she called back stating it was making her too sleepy.  She was planning a trip and she was encouraged to stop the medicine until she returns from her trip and we would reevaluate. 06/11/2020 phone  call stating after stopping the Rexulti she was said, depressed and crying all the time and wanted to return to Rexulti at 1 mg daily to which that was agreed. 07/02/2020 she called back wanting to milligram tablets of Rexulti because she had increased it on her own to that dosage and felt better.  She also had stopped drinking wine in the evening which she felt like helped.  So at her request a prescription was submitted for 2 mg Rexulti to the pharmacy. 07/16/2020 phone call stating she was very lonely and wanted to go back up to 3 mg of Rexulti.  That request was refused because of her previous side effects and making frequent changes with a med with a very long half-life like Rexulti is not a good idea.  07/21/2020 appointment with the following noted: Pretty desperate to do something. She increased Rexulti AMA to 3 mg despite being asked not to do so. Still sleepy and anxious.  Stopped wine for several weeks Ativan 1 -2 mg daily at work bc very anxious. Yesterday when son was there she wasn't sleepy or depressed until he left. Denies drowsiness when driving. Feels no better with grief. Plan: Stop Rexulti DT sleepiness.   Start Latuda 20 mg daily but she refuses bc doesn't think she can eat enough with it.  Then says she'll try it.  Topiramate She would like to increase further; OK increase to 75 mg BID.  No therapy since husband died. Needs counseling desperately.  She commits.  09/22/2020 appointment with the following noted Multiple phone calls since the last visit in crisis. Tried Latuda up to 40 mg a day with 3 mg Ativan daily.  She felt like it made her worse and stopped it. Recommended haloperidol 2 mg tablets 1-2 nightly due to extreme unmanageable anxiety and fearfulness.   08/18/2020 telephone call with the following noted:Tried haloperidol 1 mg and didn't help anything.  No effect except a little sleepiness.  Took it sparingly bc afraid of it.  Afraid of tremors and TD.  I didn't to  give it a good chance. Strongly rec ECT.  Currently will consider and agrees to go to [facility] for consult.  I'm scared.  Scared of meds too. Currently on sertraline 200 mg daily. Plan: Reduce sertraline to 150 daily and start duloxetine 30 mg capsule daily for 5 days, Then reduce sertraline to 100 mg and increase duloxetine to 2 capsules daily for 5 days, Then reduce sertraline to 1/2 of 100 mg aily and increase duloxetine to 90 mg daily for 5 days then stop sertraline and continue duloxetine 90 mg daily. Disc SE.  09/09/2020 extended phone call in crisis with nurse.  She was still transitioning from sertraline to duloxetine and trazodone was added for sleep. She's refusing ECT consult with [facility] and didn't  go. She has found a new therapist and seen her several times and she's local.  She is hopeful it might help. On duloxetine 90 mg since about 09/02/20. Never took buspirone but did receive it. Takes Ativan. Taking topiramate 75 mg in AM and on 50 mg PM.  Never increased to 75 mg BID. Takes Ambien initially and trazodone 25 mg HS and sleep not as good with duloxetine vs Zoloft.  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  Before that was sobbing and often not staying with herself. FMLA filled out for accomodation for leave if crying spells are unmanageable. Used to call H when she got in the car and son offered to have her call but he's less available.  So often cries in the car but onlly one bad crying spell after Nudexta. Hx migraine tx by neurologist who said MRI showed spots suggesting head injury.  Pt never remembers history of head injury. 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 Tomorrow is her and H's birthday and having family party tonight. Anniversary of 2 years is 10/25 and doing better about staying alone in her house. Uncertain mood effect from change to duloxetine so far but thinks maybe she's a  little better. Doesn't tolerate being alone very well.  Worry about winter. Plan continue Nuedexta was helpful for crying spells though it did not resolve them Continue duloxetine 90 mg to give it more time to work as it is only been about 3 weeks Okay per her request to increase topiramate to 75 mg twice daily.  10/26/2020 appointment with the following noted: Takes Ativan in morning.  Drinks more caffeine at home than work.  Might cause some anxiety.  But anxious at work too over insecurity and takes Ativan in morning also.  Takes it to drive. Average 2-3 Ativan daily.  Less anxious if not alone. On duloxetine 90 for 7 weeks. Crying spells still better on Nudexta than not.  Better than last time. Duloxetine is helping with depression and anxiety both are better than last visit.  Dep 5/10.  Anxiety  6/10 bc of work and driving.  Friends notice the benefit also. Not a winter person and dreads it. Not much appetite in evening but eats good lunch at work and breakfast.  Topiramate has curbed her appetite.   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   Work stressful and hates driving including in the winter and doesn't think she can do it anymore.   Sleep about 5 hours and sometimes can't get back to sleep on trazodone 50 and Ambien and Ativan but doesn't think she can take more trazodone DT hangover.  No amnesia.  Plan: Nuedexta markedly helpful for crying but hasn't resolved it. Duloxetine 90 on 7 weeks with benefit..  Increase dulxetine to max to see if can get more benefit bc sig residual sx. 120 mg daily.  11/25/2020 appointment with the following noted: Hard time. Since here.  Started crying a lot again.  Has been consistent with Nudexta.  Felt good for 3-4 days then last night had neg interaction with daughter while in men's section of [employer].  Got triggered over seeing men's clothes and D got mad.  She was crying and got a disoriented and had trouble finding her way out.  She doesn't understand  her grief.  Crying for weeks and then briefly better and recurred. Plan to retire Feb but scared to stay by herself.  Work is only  socialization she has and it's place she can enjoy things.   Never stayed alone before. I can't stay by myself.  Not afraid but lonely.  No hobbies. Plan: Nuedexta markedly helpful for crying until the last visit it started up again. Reduce Duloxetine to  90 mg bc increase didn't help after 4 weeks and the crying may be worse related to NE. Risperidone 1 mg BID prn   12/28/2020 appointment with the following noted: Was mainly taking gabapentin for pain and it's better sor reduced gabapentin to 2 daily for months.  Forgets to take it at work. Anxiety all day long. Risperidone prn bad crying day only once and it helped.  No SE Crying a lot since here when she is alone at home or in car.  Does not do it at work. Painful cry.  Sad and lonely.  Grief cry.   More anxious driving than she used to be. Using Ativan 1 mg average 2 daily.  Plan: Nuedexta markedly helpful for crying until the last visit it crying worsened again. Continue Duloxetine  90 mg bc increase didn't help after 4 weeks and the crying may be worse related to NE. 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  If fails after a week reduce back to current dose and instead increase Risperidone 1 mg BID.   01/27/2021 appt with following noted: Never  Remembered to take gabapentin QID but could take BID.  No SE. Since here more sad than anxious.  No panic lately. Still crying spells.  Only took risperidone once.  Most of crying in AM and off work at home alone. Is losing weight with topiramate and would like to go higher. Can feel alright when with family. Plan: Reduce duloxetine to 60 mg daily. Start imipramine 1 of the 25 mg tablets nightly for 1 week,  then 2 nightly for 1 week,  then 3 at night for 1 week. Wait 1 week and get the blood test, preferably in the morning.  02/25/2021  appointment with the following noted: Still crying, horrible yesterday on the way home.  Hopeless.  Still doesn't like being alone.  Has a new counselor.  Was invited to church function tomorrow. SE cotton mouth.  awakens 2-3 times a night.  Negative thoughts about taking so much medicine. Kids and gkids in [place] and can't move away.   Angry over tracking devices.    Past Psychiatric Medication Trials:  Zoloft 300, Lexapro,, paroxetine 80 in 2017, Duloxetine 120 Imipramine 75 Nudexta helped crying for awhile until increase duloxetine and holidays Paliperidone, olanzapine, aripiprazole 20 mg, buspirone, Rexulti 3 mg sleepy clonidine, topiramate, gabapentin, metformin,  phentermine,  trazodone, Ambien, Ativan,  Remote history topiramate for migraine 1998, [place] helped lose 85#  Review of Systems:  Review of Systems  Constitutional: Positive for fatigue. Negative for unexpected weight change.       Sweating  HENT:       Dry mouth  Respiratory: Negative for wheezing.   Cardiovascular: Negative for chest pain and palpitations.  Gastrointestinal: Negative for rectal pain.  Genitourinary: Negative for pelvic pain.  Musculoskeletal: Positive for myalgias.  Neurological: Negative for dizziness, tremors and weakness.  Psychiatric/Behavioral: Positive for depression and dysphoric mood. Negative for agitation, behavioral problems, confusion, decreased concentration, hallucinations, self-injury, sleep disturbance and suicidal ideas. The patient is nervous/anxious. The patient is not hyperactive.     Medications: I have reviewed the patient's current medications.  Current Outpatient Medications  Medication Sig  Dispense Refill  . albuterol (PROAIR HFA) 108 (90 Base) MCG/ACT inhaler Inhale 2 puffs into the lungs every 6 (six) hours as needed for wheezing or shortness of breath. Use as needed only  if your can't catch your breath 18 g 3  . amLODipine (NORVASC) 5 MG tablet TAKE 1 TABLET BY  MOUTH DAILY 90 tablet 0  . amLODipine (NORVASC) 5 MG tablet Take 1 tablet (5 mg total) by mouth daily. 90 tablet 0  . Cholecalciferol (VITAMIN D) 125 MCG (5000 UT) CAPS Take 5,000 Units by mouth daily.    Marland Kitchen Dextromethorphan-quiNIDine (NUEDEXTA) 20-10 MG capsule Take 1 capsule by mouth 2 (two) times daily. 60 capsule 2  . doxycycline (VIBRAMYCIN) 100 MG capsule Take 100 mg by mouth 2 (two) times daily.    . DULoxetine (CYMBALTA) 30 MG capsule Take 3 capsules (90 mg total) by mouth daily. (Patient taking differently: Take 60 mg by mouth daily.) 270 capsule 0  . fexofenadine (ALLEGRA) 180 MG tablet Take 180 mg by mouth daily.    Marland Kitchen gabapentin (NEURONTIN) 800 MG tablet Take 1 tablet (800 mg total) by mouth 4 (four) times daily. (Patient taking differently: Take 800 mg by mouth 4 (four) times daily. Usually just am) 120 tablet 1  . imipramine (TOFRANIL) 25 MG tablet 1 at night for 1 week, then 2 at night for 1 week, then 3 at night (Patient taking differently: Take 75 mg by mouth at bedtime.) 90 tablet 1  . LORazepam (ATIVAN) 1 MG tablet Take 1 tablet (1 mg total) by mouth every 6 (six) hours as needed. (Patient taking differently: Take 1 mg by mouth every 6 (six) hours as needed. 1-2 tabs daily.) 60 tablet 1  . mometasone (NASONEX) 50 MCG/ACT nasal spray Place 2 sprays into the nose daily. 17 g 3  . montelukast (SINGULAIR) 10 MG tablet Take 1 tablet (10 mg total) by mouth daily. 90 tablet 0  . nebivolol (BYSTOLIC) 10 MG tablet Take 1 tablet (10 mg total) by mouth 2 (two) times daily. 180 tablet 0  . pantoprazole (PROTONIX) 40 MG tablet Take 1 tablet (40 mg total) by mouth 2 (two) times daily. 60 tablet 2  . topiramate (TOPAMAX) 100 MG tablet Take 1 tablet (100 mg total) by mouth 2 (two) times daily. 180 tablet 0  . Travoprost, BAK Free, (TRAVATAN) 0.004 % SOLN ophthalmic solution Place 1 drop into both eyes at bedtime.    . traZODone (DESYREL) 50 MG tablet Take 1 tablet (50 mg total) by mouth at bedtime.  90 tablet 0  . valACYclovir (VALTREX) 1000 MG tablet Take 1,000 mg by mouth 3 (three) times daily.    Marland Kitchen zolpidem (AMBIEN) 10 MG tablet TAKE 1/2 TO 1 TABLET BY MOUTH AT BEDTIME AS NEEDED 30 tablet 5  . B Complex Vitamins (B-COMPLEX/B-12) LIQD Place 5 mLs under the tongue daily at 2 PM daily at 2 PM. (Patient not taking: Reported on 02/25/2021)    . busPIRone (BUSPAR) 7.5 MG tablet Take 1 tablet (7.5 mg total) by mouth 3 (three) times daily. (Patient not taking: No sig reported) 90 tablet 1  . estradiol (ESTRACE) 0.5 MG tablet TAKE 1 TABLET BY MOUTH ONCE DAILY (Patient not taking: No sig reported) 30 tablet 3  . hydrochlorothiazide (MICROZIDE) 12.5 MG capsule TAKE 1 CAPSULE BY MOUTH DAILY 90 capsule 0  . NP THYROID 60 MG tablet TAKE 2 TABLETS (120 MG TOTAL) BY MOUTH DAILY. 60 tablet 1  . progesterone (PROMETRIUM) 100 MG capsule Take  1 capsule (100 mg total) by mouth daily. (Patient not taking: No sig reported) 90 capsule 0  . risperiDONE (RISPERDAL) 1 MG tablet Take 1 tablet (1 mg total) by mouth 2 (two) times daily as needed (severe crying and anxiety). (Patient not taking: No sig reported) 30 tablet 1   No current facility-administered medications for this visit.    Medication Side Effects: Other: ? sweating related.  Allergies:  Allergies  Allergen Reactions  . Levaquin [Levofloxacin] Other (See Comments)    MUSCLE PAIN  . Nickel Rash    Past Medical History:  Diagnosis Date  . Allergy   . Anxiety   . Bronchiectasis   . Bronchitis    chronic  . 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 COPD (chronic obstructive pulmonary disease) ([place])   . Dental crown present    upper  . Depression   . Environmental allergies    receives allergy shots  . GERD (gastroesophageal reflux disease)   . 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 - no current med.  . Glucose intolerance (impaired glucose tolerance)    on Metformin  . H/O hiatal hernia    "sliding"  . History of  echocardiogram    Echo 1/16: EF 60-65, no RWMA, Gr 1 DD, mild LAE, trivial pericardial eff post to heart  . History of echocardiogram    Echo 11/17: EF 55-60, no RWMA, Gr 1 DD, normal RV function.  Marland Kitchen History of palpitations    Holter 12/15: NSR, PACs  . Hx of migraines   . Hypothyroidism   . Osteoarthritis    knees  . Positional headache since 1997   if lies on left side    Family History  Problem Relation Age of Onset  . Lung cancer Father 43       dies age 22  . Emphysema Father   . COPD Brother 15       died age 55  . Stroke Mother   . Hypertension Sister   . Thyroid disease Sister        Graves Disease  . Breast cancer Neg Hx     Social History   Socioeconomic History  . Marital status: Widowed    Spouse name: Not on file  . Number of children: Not on file  . Years of education: Not on file  . Highest education level: Not on file  Occupational History  . Occupation: Land    Employer: [employer]  Tobacco Use  . Smoking status: Never Smoker  . Smokeless tobacco: Never Used  Vaping Use  . Vaping Use: Never used  Substance and Sexual Activity  . Alcohol use: Yes    Alcohol/week: 4.0 standard drinks    Types: 4 Glasses of wine per week  . Drug use: No  . Sexual activity: Yes    Partners: Male    Birth control/protection: Post-menopausal  Other Topics Concern  . Not on file  Social History Narrative   Widowed late 2019 after 40+ years of marriage   3 sons one daughter   Working as a Company secretary for [employer]   No/never tobacco no drug use 1 caffeinated beverage daily, occasional or rare glass of wine   Social Determinants of Radio broadcast assistant Strain: Not on file  Food Insecurity: Not on file  Transportation Needs: Not on file  Physical Activity: Not on file  Stress: Not on file  Social Connections: Not  on file  Intimate Partner Violence: Not on file    Past Medical History, Surgical history, Social history, and  Family history were reviewed and updated as appropriate.   M history of Lewey Body Dementia  Please see review of systems for further details on the patient's review from today.   Objective:   Physical Exam:  LMP 12/12/2005   Physical Exam Constitutional:      General: She is not in acute distress. Musculoskeletal:        General: No deformity.  Neurological:     Mental Status: She is alert and oriented to person, place, and time.     Cranial Nerves: No dysarthria.     Coordination: Coordination normal.  Psychiatric:        Attention and Perception: Attention and perception normal. She does not perceive auditory or visual hallucinations.        Mood and Affect: Mood is anxious and depressed. Affect is labile and tearful. Affect is not blunt, angry or inappropriate.        Speech: Speech normal.        Behavior: Behavior normal. Behavior is cooperative.        Thought Content: Thought content normal. Thought content is not paranoid or delusional. Thought content does not include homicidal or suicidal ideation. Thought content does not include homicidal or suicidal plan.        Cognition and Memory: Cognition and memory normal.        Judgment: Judgment normal.     Comments: Insight intact No sign changes Chronic lability     Lab Review:     Component Value Date/Time   NA 138 08/27/2020 1126   K 3.4 (L) 08/27/2020 1126   CL 98 08/27/2020 1126   CO2 29 08/27/2020 1126   GLUCOSE 119 (H) 08/27/2020 1126   BUN 19 08/27/2020 1126   CREATININE 0.93 08/27/2020 1126   CALCIUM 9.7 08/27/2020 1126   PROT 6.9 08/27/2020 1126   ALBUMIN 4.4 02/05/2015 0850   AST 17 08/27/2020 1126   ALT 25 08/27/2020 1126   ALKPHOS 53 02/05/2015 0850   BILITOT 0.5 08/27/2020 1126   GFRNONAA 65 08/27/2020 1126   GFRAA 75 08/27/2020 1126       Component Value Date/Time   WBC 8.5 08/27/2020 1126   RBC 4.60 08/27/2020 1126   HGB 14.4 08/27/2020 1126   HCT 42.6 08/27/2020 1126   PLT 293  08/27/2020 1126   MCV 92.6 08/27/2020 1126   MCH 31.3 08/27/2020 1126   MCHC 33.8 08/27/2020 1126   RDW 12.5 08/27/2020 1126   LYMPHSABS 1,148 08/27/2020 1126   MONOABS 0.6 03/23/2018 1244   EOSABS 34 08/27/2020 1126   BASOSABS 17 08/27/2020 1126    No results found for: POCLITH, LITHIUM   No results found for: PHENYTOIN, PHENOBARB, VALPROATE, CBMZ   .res Assessment: Plan:    [name] [name] was seen today for follow-up, severe episode of recurrent major depressive disorder, with, depression and anxiety.  Diagnoses and all orders for this visit:  Severe episode of recurrent major depressive disorder, without psychotic features ([place]) -     Imipramine with  desipramine, blood  Complicated grief  Panic disorder with agoraphobia  Insomnia due to mental condition  Generalized anxiety disorder  Pseudobulbar affect  Obstructive sleep apnea     History of ministrokes  Greater than 50% of 30 minface to face time with patient was spent on counseling and coordination of care. We discussed Disc the difference  between depression and grief.   Her panic disorder is generally controlled but more anxious somewhat.. She is still dealing with a lot of grief with waves of depression and anxiety and at times easily overwhelmed but functional.  Disc importance of staying busy to help with depression and anxiety. Crying worse when alone.  Doesn't handle being alone well.  Disc options moving closer to family or friends. Disc other options.    Nuedexta markedly helpful for crying until recent visit it crying worsened again.  conitnue it. Likely to get worse if stopped.  Continue gabapentin 800 BID if she can remember  for TR anxiety.   Continue imipramine 75 Hs and duloxetine 60 for now and get blood test ASAP.  Disc DDI with the 2 meds and will not continue both longterm.  Few options for crying at this point except antipsychotics.  She refuses haloperidol.  High potency are more likely to help.   Other option is risperidone but disc weight gain.  Suggest risperidone.  Has not tried TCA.  Rec switch from duloxetine to imipramine and check level. Scared of any change, even the weather.  Discussed the possibility this could be contributing to weight gain.  Realistically the only alternative would be fluoxetine and it is not as good of an anxiety med.  Her anxiety has been severe in the past.  I would advise against changing or reducing the dose at this time because she has recently had a little more depression and anxiety than baseline.  She is not using the lorazepam excessively.  She is taking it appropriately.  The diazepam is prescribed vaginally by her gynecologist for chronic pelvic pain and not being used. We discussed the short-term risks associated with benzodiazepines including sedation and increased fall risk among others.  Discussed long-term side effect risk including dependence, potential withdrawal symptoms, and the potential eventual dose-related risk of dementia.  Disc the risk of Ambien amnesia.  Option busipirone  Topiramate  100 mg BID.   To help weight and anxiety off label.  It is helping with weight loss and she's tolerating it.  She has been successful losing weight on this.  It also is used for HA.   Naltrexone or metformin or Ozempic  are other options.  Pain is better but not gone and in therapy.  Treated for OSA by Dr. Baird Lyons.  Started Needed counseling.  She commits to continue it..    If retires disc the importance of picking good Medicare D plan that will cover the Nudexta in case it's needed. Also needs to continue social contact.  Weekly grief counselor is good and close to her.  This appt was 30 mins.  FU 4 weeks, make 2 appts DT chronic unstable and desperate  Lynder Parents, MD, DFAPA  Please see After Visit Summary for patient specific instructions.    Future Appointments  Date Time Provider [place]  04/20/2021  4:00 PM  Cottle, Billey Co., MD [facility] None  05/17/2021  4:00 PM Cottle, Billey Co., MD [facility] None    Orders Placed This Encounter  Procedures  . Imipramine with  desipramine, blood      -------------------------------

## 2021-02-27 ENCOUNTER — Other Ambulatory Visit (INDEPENDENT_AMBULATORY_CARE_PROVIDER_SITE_OTHER): Payer: Self-pay | Admitting: Nurse Practitioner

## 2021-02-27 ENCOUNTER — Other Ambulatory Visit (INDEPENDENT_AMBULATORY_CARE_PROVIDER_SITE_OTHER): Payer: Self-pay | Admitting: Internal Medicine

## 2021-02-27 DIAGNOSIS — I10 Essential (primary) hypertension: Secondary | ICD-10-CM

## 2021-02-27 DIAGNOSIS — J4540 Moderate persistent asthma, uncomplicated: Secondary | ICD-10-CM

## 2021-02-27 DIAGNOSIS — E559 Vitamin D deficiency, unspecified: Secondary | ICD-10-CM

## 2021-02-27 DIAGNOSIS — J302 Other seasonal allergic rhinitis: Secondary | ICD-10-CM

## 2021-03-01 ENCOUNTER — Other Ambulatory Visit (INDEPENDENT_AMBULATORY_CARE_PROVIDER_SITE_OTHER): Payer: Self-pay | Admitting: Internal Medicine

## 2021-03-01 MED FILL — HYDROCHLOROTHIAZIDE 12.5 MG: 12.5 | 90 days supply | Qty: 90 | Fill #0

## 2021-03-01 MED FILL — NP THYROID 60 MG TABLET: 60 | 30 days supply | Qty: 60 | Fill #0

## 2021-03-02 ENCOUNTER — Other Ambulatory Visit: Payer: Self-pay | Admitting: Psychiatry

## 2021-03-02 ENCOUNTER — Telehealth: Payer: Self-pay | Admitting: Psychiatry

## 2021-03-02 DIAGNOSIS — F332 Major depressive disorder, recurrent severe without psychotic features: Secondary | ICD-10-CM | POA: Diagnosis not present

## 2021-03-02 MED FILL — IMIPRAMINE HCL 25 MG TABLET: 25 | 30 days supply | Qty: 90 | Fill #1

## 2021-03-02 NOTE — Telephone Encounter (Signed)
Received fax from [employer] on [address]. Medical Certification Form needs completed. Placed on Traci's desk.

## 2021-03-04 ENCOUNTER — Telehealth: Payer: Self-pay | Admitting: Psychiatry

## 2021-03-04 DIAGNOSIS — Z0289 Encounter for other administrative examinations: Secondary | ICD-10-CM

## 2021-03-04 NOTE — Telephone Encounter (Signed)
You can give this to Vivien Rota if you want.  I just saw her 1 week ago.  We recently changed her meds and she is to get the imipramine level ASAP.  I cannot know how to change meds without that information.  I told her this at the appt and she agreed to get the test.  I'm not changing meds until she does the test and it may take a week to get results from it.

## 2021-03-04 NOTE — Telephone Encounter (Signed)
[name] [name] called and is still concerned about her medications and doesn't feel like any are helping her. She was very emotional on the phone and kept crying uncontrollably. A lot of it is stemming from the death of her husband six months ago. She would like Dr. Clovis Pu to call her. Her phone number is [phone].

## 2021-03-04 NOTE — Telephone Encounter (Signed)
Please review

## 2021-03-04 NOTE — Telephone Encounter (Signed)
Left pt voicemail with information and to call back with further concerns.

## 2021-03-05 ENCOUNTER — Other Ambulatory Visit (HOSPITAL_BASED_OUTPATIENT_CLINIC_OR_DEPARTMENT_OTHER): Payer: Self-pay

## 2021-03-05 NOTE — Telephone Encounter (Signed)
Left voicemail to call back again

## 2021-03-06 LAB — IMIPRAMINE LEVEL
Desipramine: 80 ng/mL
Imipramine Lvl: 28 ng/mL
Total (Imi+Des): 108 ng/mL — ABNORMAL LOW (ref 175–300)

## 2021-03-06 LAB — DESIPRAMINE, SERUM: Desipramine Lvl: 88 ng/mL — ABNORMAL LOW (ref 150–250)

## 2021-03-11 ENCOUNTER — Telehealth: Payer: Self-pay | Admitting: Psychiatry

## 2021-03-11 NOTE — Telephone Encounter (Signed)
[name] [name] called in today with concerns about a Medical Certification form to be filled out. She states it was received last week. She needs the form completed ASAP and would like CB as today is her day off, and she can't answer phone during work hours. She also states she had some blood work done on 3/18 and was CC was supposed to call her back concerning the results. Tripp RTC [phone]

## 2021-03-11 NOTE — Telephone Encounter (Signed)
Left pt msg to call me back

## 2021-03-11 NOTE — Telephone Encounter (Signed)
I ordered and imipramine blood level.  I have not received it.  Please call the patient and ask where she had it done and try to track down the results and get the lab to send it to me.

## 2021-03-11 NOTE — Telephone Encounter (Signed)
FMLA was already completed last week and faxed just like her previous one. Dr. Clovis Pu did not mention anything extra added. If she needs something else added we would have to update her forms.   Perhaps she can be called later in the day, I think she works till 3 pm at least.

## 2021-03-11 NOTE — Telephone Encounter (Signed)
We never received results.  Call them again and get verbal report of the results and get them to fax again.  Make sure they have the right fax number.

## 2021-03-11 NOTE — Telephone Encounter (Signed)
Please review

## 2021-03-11 NOTE — Telephone Encounter (Signed)
I spoke to [facility] and they are faxing over the results now

## 2021-03-12 ENCOUNTER — Telehealth: Payer: Self-pay | Admitting: Psychiatry

## 2021-03-12 NOTE — Progress Notes (Signed)
Her blood level of imipramine on imipramine 75 Hs and duloxetine 60 is 108.  The level needs to be about 200.  Therefore have her increase imipramine 200 mg for 5 nights, then increase to 150 mg.  When she needs it I will send in a prescription for imipramine 50 mg tablets 3 nightly which is double the current strength of her 25 mg tablets. She should continue the duloxetine 60 mg daily until we can achieve an adequate blood level of imipramine which this increase should accomplish in about 1 week after increasing to 150 mg daily.

## 2021-03-12 NOTE — Telephone Encounter (Signed)
Pt called back and she was just making sure paperwork was sent in. She feels her job isn't very happy with her about the accomodation of not being pulled to another floor to work. I reassured pt that anytime someone takes FMLA or has an accomodation in place especially related to psychiatry they don't always make the pt feel good about themselves. Encouraged her to not worry about their thoughts and if they have any questions or concerns they can contact the office and Dr. Clovis Pu. She felt reassured and was appreciative of call.  Informed her as soon as we received and Dr. Clovis Pu reviewed her labs we would call back.

## 2021-03-12 NOTE — Telephone Encounter (Signed)
tC to correct instructions on dosing of imipramine.  Her blood level of imipramine on imipramine 75 Hs and duloxetine 60 is 108.  The level needs to be about 200.  Therefore have her increase imipramine  to 100 mg for 5 nights, then increase to 150 mg.  When she needs it I will send in a prescription for imipramine 50 mg tablets 3 nightly which is double the current strength of her 25 mg tablets. She should continue the duloxetine 60 mg daily until we can achieve an adequate blood level of imipramine which this increase should accomplish in about 1 week after increasing to 150 mg daily.  She understood.  She'll call after on 150 mg for a week and we will repeat the lab.  Lynder Parents, MD, DFAPA

## 2021-03-15 ENCOUNTER — Telehealth: Payer: Self-pay

## 2021-03-15 NOTE — Telephone Encounter (Signed)
Pt contacted nurse this afternoon reporting that they will not accept the FMLA paperwork submitted for her request on an accommodation not to be pulled to a different floor. She reports a letter needs to be sent ASAP stating pt no longer needs accommodations and the paperwork previously sent is being withdrawn.   Pt reports if not faxed they are getting rid of her position and she will have to apply to another job with [employer].   Fax to [phone] Attn: Raeanne Gathers

## 2021-03-16 NOTE — Telephone Encounter (Signed)
I think Eustaquio Maize was able to complete this for pt.

## 2021-03-16 NOTE — Telephone Encounter (Signed)
Leda Gauze will you type this letter stating our request for accommodation have been withdrawn.?  This patient is very anxious to have the letter ASAP.

## 2021-03-17 ENCOUNTER — Other Ambulatory Visit (HOSPITAL_BASED_OUTPATIENT_CLINIC_OR_DEPARTMENT_OTHER): Payer: Self-pay

## 2021-03-17 ENCOUNTER — Telehealth: Payer: Self-pay | Admitting: Psychiatry

## 2021-03-17 NOTE — Telephone Encounter (Signed)
Pt contacted office at 9:15 am to make sure we have the letter for her to fax to HR. I informed her that Dr. Clovis Pu had signed the letter and it was attempted to be faxed twice already this am. She was very distraught and wanted affirmation that it would get sent. I told her that if the fax did not send then I would email it directly to those parties in the HR Dept at [facility].The fax did not go through after 3 tries. The email was sent  at 9:35 am by [facility] J.

## 2021-03-19 ENCOUNTER — Other Ambulatory Visit (HOSPITAL_COMMUNITY): Payer: Self-pay

## 2021-03-19 ENCOUNTER — Other Ambulatory Visit (INDEPENDENT_AMBULATORY_CARE_PROVIDER_SITE_OTHER): Payer: Self-pay | Admitting: Nurse Practitioner

## 2021-03-19 ENCOUNTER — Other Ambulatory Visit (INDEPENDENT_AMBULATORY_CARE_PROVIDER_SITE_OTHER): Payer: Self-pay | Admitting: Internal Medicine

## 2021-03-19 DIAGNOSIS — J3089 Other allergic rhinitis: Secondary | ICD-10-CM

## 2021-03-19 DIAGNOSIS — J4540 Moderate persistent asthma, uncomplicated: Secondary | ICD-10-CM

## 2021-03-19 DIAGNOSIS — I10 Essential (primary) hypertension: Secondary | ICD-10-CM

## 2021-03-19 DIAGNOSIS — J302 Other seasonal allergic rhinitis: Secondary | ICD-10-CM

## 2021-03-19 DIAGNOSIS — E559 Vitamin D deficiency, unspecified: Secondary | ICD-10-CM

## 2021-03-19 MED FILL — Zolpidem Tartrate Tab 10 MG: ORAL | 30 days supply | Qty: 30 | Fill #0 | Status: AC

## 2021-03-20 ENCOUNTER — Other Ambulatory Visit (HOSPITAL_COMMUNITY): Payer: Self-pay

## 2021-03-20 MED ORDER — GABAPENTIN 800 MG PO TABS
800.0000 mg | ORAL_TABLET | Freq: Four times a day (QID) | ORAL | 1 refills | Status: DC
  Filled 2021-03-20: qty 120, 30d supply, fill #0
  Filled 2021-08-10: qty 120, 30d supply, fill #1

## 2021-03-20 MED ORDER — AMLODIPINE BESYLATE 5 MG PO TABS
1.0000 | ORAL_TABLET | Freq: Every day | ORAL | 2 refills | Status: DC
  Filled 2021-03-20: qty 30, 30d supply, fill #0
  Filled 2021-04-26: qty 30, 30d supply, fill #1
  Filled 2021-05-28: qty 30, 30d supply, fill #2

## 2021-03-22 ENCOUNTER — Other Ambulatory Visit (HOSPITAL_COMMUNITY): Payer: Self-pay

## 2021-03-25 ENCOUNTER — Other Ambulatory Visit (HOSPITAL_COMMUNITY): Payer: Self-pay

## 2021-03-26 ENCOUNTER — Other Ambulatory Visit: Payer: Self-pay | Admitting: Psychiatry

## 2021-03-26 ENCOUNTER — Other Ambulatory Visit (HOSPITAL_COMMUNITY): Payer: Self-pay

## 2021-03-26 ENCOUNTER — Telehealth: Payer: Self-pay | Admitting: Physician Assistant

## 2021-03-26 DIAGNOSIS — F411 Generalized anxiety disorder: Secondary | ICD-10-CM

## 2021-03-26 DIAGNOSIS — F332 Major depressive disorder, recurrent severe without psychotic features: Secondary | ICD-10-CM

## 2021-03-26 DIAGNOSIS — F4001 Agoraphobia with panic disorder: Secondary | ICD-10-CM

## 2021-03-26 MED ORDER — IMIPRAMINE HCL 50 MG PO TABS
150.0000 mg | ORAL_TABLET | Freq: Every day | ORAL | 0 refills | Status: DC
  Filled 2021-03-26: qty 15, 5d supply, fill #0

## 2021-03-26 NOTE — Telephone Encounter (Signed)
Patient called on-call provider. Almost completely out of Imipramine and didn't know it till today. Asks for it to be sent to [facility].  Imipramine 50 mg #15, 3 po qhs was sent.

## 2021-03-29 ENCOUNTER — Other Ambulatory Visit: Payer: Self-pay | Admitting: Psychiatry

## 2021-03-29 MED ORDER — IMIPRAMINE HCL 50 MG PO TABS
150.0000 mg | ORAL_TABLET | Freq: Every day | ORAL | 0 refills | Status: DC
Start: 2021-03-29 — End: 2021-05-16
  Filled 2021-03-29: qty 90, 30d supply, fill #0

## 2021-03-30 ENCOUNTER — Telehealth: Payer: Self-pay | Admitting: Psychiatry

## 2021-03-30 ENCOUNTER — Other Ambulatory Visit (HOSPITAL_COMMUNITY): Payer: Self-pay

## 2021-03-30 DIAGNOSIS — U071 COVID-19: Secondary | ICD-10-CM | POA: Diagnosis not present

## 2021-03-30 DIAGNOSIS — R059 Cough, unspecified: Secondary | ICD-10-CM | POA: Diagnosis not present

## 2021-03-30 MED FILL — Thyroid Tab 60 MG (1 Grain): ORAL | 30 days supply | Qty: 60 | Fill #0 | Status: AC

## 2021-03-30 NOTE — Telephone Encounter (Signed)
[name] [name] inquired about needing a prescription for her imipramine.  She needed a full prescription sent to [facility] because over the weekend only 5 days was submitted.  It looks like a full prescription was sent yesterday, but it doesn't show receipt by the pharmacy. This may need to be resent.

## 2021-03-30 NOTE — Telephone Encounter (Signed)
I spoke to the pharmacy and the Rx will be mailed to her.

## 2021-04-01 ENCOUNTER — Other Ambulatory Visit: Payer: Self-pay | Admitting: Psychiatry

## 2021-04-01 ENCOUNTER — Other Ambulatory Visit (HOSPITAL_COMMUNITY): Payer: Self-pay

## 2021-04-01 DIAGNOSIS — F482 Pseudobulbar affect: Secondary | ICD-10-CM

## 2021-04-01 MED ORDER — NUEDEXTA 20-10 MG PO CAPS
1.0000 | ORAL_CAPSULE | Freq: Two times a day (BID) | ORAL | 2 refills | Status: DC
  Filled 2021-04-01: qty 60, 30d supply, fill #0
  Filled 2021-05-10: qty 60, 30d supply, fill #1
  Filled 2021-06-14: qty 60, 30d supply, fill #2

## 2021-04-02 ENCOUNTER — Other Ambulatory Visit (HOSPITAL_COMMUNITY): Payer: Self-pay

## 2021-04-05 ENCOUNTER — Other Ambulatory Visit (HOSPITAL_COMMUNITY): Payer: Self-pay

## 2021-04-12 ENCOUNTER — Other Ambulatory Visit: Payer: Self-pay | Admitting: Psychiatry

## 2021-04-12 DIAGNOSIS — F5105 Insomnia due to other mental disorder: Secondary | ICD-10-CM

## 2021-04-13 ENCOUNTER — Other Ambulatory Visit (HOSPITAL_COMMUNITY): Payer: Self-pay

## 2021-04-14 ENCOUNTER — Other Ambulatory Visit (HOSPITAL_COMMUNITY): Payer: Self-pay

## 2021-04-14 MED ORDER — TRAZODONE HCL 50 MG PO TABS
ORAL_TABLET | Freq: Every day | ORAL | 0 refills | Status: DC
  Filled 2021-04-14: qty 90, 90d supply, fill #0

## 2021-04-14 MED FILL — Zolpidem Tartrate Tab 10 MG: ORAL | 30 days supply | Qty: 30 | Fill #1 | Status: AC

## 2021-04-15 ENCOUNTER — Other Ambulatory Visit (HOSPITAL_COMMUNITY): Payer: Self-pay

## 2021-04-20 ENCOUNTER — Ambulatory Visit (INDEPENDENT_AMBULATORY_CARE_PROVIDER_SITE_OTHER): Payer: 59 | Admitting: Psychiatry

## 2021-04-20 ENCOUNTER — Other Ambulatory Visit: Payer: Self-pay

## 2021-04-20 ENCOUNTER — Encounter: Payer: Self-pay | Admitting: Psychiatry

## 2021-04-20 DIAGNOSIS — F411 Generalized anxiety disorder: Secondary | ICD-10-CM

## 2021-04-20 DIAGNOSIS — G4733 Obstructive sleep apnea (adult) (pediatric): Secondary | ICD-10-CM

## 2021-04-20 DIAGNOSIS — F4001 Agoraphobia with panic disorder: Secondary | ICD-10-CM | POA: Diagnosis not present

## 2021-04-20 DIAGNOSIS — F4321 Adjustment disorder with depressed mood: Secondary | ICD-10-CM

## 2021-04-20 DIAGNOSIS — F482 Pseudobulbar affect: Secondary | ICD-10-CM

## 2021-04-20 DIAGNOSIS — F5105 Insomnia due to other mental disorder: Secondary | ICD-10-CM

## 2021-04-20 DIAGNOSIS — F332 Major depressive disorder, recurrent severe without psychotic features: Secondary | ICD-10-CM | POA: Diagnosis not present

## 2021-04-20 NOTE — Progress Notes (Signed)
[name] [name] [id number] 05-05-1956 65 y.o.   Subjective:   Patient ID:  [name] [name] is a 65 y.o. (DOB Oct 12, 1956) female.  Chief Complaint:  Chief Complaint  Patient presents with  . Follow-up  . Severe episode of recurrent major depressive disorder, with  . Depression  . Anxiety    Depression        Associated symptoms include fatigue, myalgias and headaches.  Associated symptoms include no decreased concentration and no suicidal ideas. Medication Refill Associated symptoms include fatigue, headaches and myalgias. Pertinent negatives include no chest pain or weakness.   [name] [name] presents to the office today for follow-up of anxiety.  seen April 2020.  She was dealing with grief and depression and sertraline was increased back to 200 mg daily.  [name] [name] died in October 16, 2018  from cancer.  Married 41 years.  Is able to focus on positive memories.  Not ready to move.  seen August 2020.  No meds were changed.  She continued sertraline 200 mg daily.  seen January 2020.  Sertraline was unchanged.  She was having still grief issues related to the loss of her husband.  She was also having problems with weight attributed to the sertraline.  She was given a trial of topiramate to try to help offset some of the weight gain plus it can have some potential antianxiety effects.  seen 02/19/20 stating: Very depressed. So sad.  Don't want to get OOB.  Got lost coming here and late.  Worse for 3-4 weeks.  Days off are terrible.  Barely able to function at work.  Cry all the way home after work.  So lonely.  Kids aren't coming like they were bc they are busy.  Need to figure out her own life.  Life was centered around H and kids.  Denies suicidal thoughts  Made following med changes: Increase sertraline back to 300 mg daily.  She's aware it's higher than the expected usual dose. Rexulti 2 mg daily Topiramate increase to target 50 mg BID and possibly higher for  obesity.  03/18/2020 appointment the following is noted: No meds were changed. She didn't increase sertraline bc fear of weight gain.  Quite a bit different.  No crying.  Sad a few times.  Better in 5 days.  Feels much less depressed.  Better energy, interest, enjoyment.  Also weather helping and started planting.  More motivation.  Desperate to lose weight.  Has some chronic pain and taking gabapentin.   Started gym cycle and lost some weight.   Worried over D with depression and conflict with pt.  D was always closer to father than her.  D distanced from her and recent bad outburst with her.  D recently blocked communication with her.   Disc getting her help.  05/19/2020 appointment with the following noted: Tried to self medicate bc felt good on Rexulti and tried reducing sertraline to 150 for 2 weeks and got worse.  So increased back to 100 mg BID. Stopped cable and got too bored for 3 mos.  Restarted cable yesterday.   Still grieving.  Kids want her to sell the house but she can't. Anxiety in the morning fidgety.  Taken more Ativan lately with more panic and worry over the future.  Almost like too much caffeine before any.  Limits it. Better if busy. Does better with working.  Was too sleepy with progesterone. Successful losing weight on the topiramate. Assessment/Plan: Her panic disorder is generally controlled  but more anxious somewhat..She is still dealing with a lot of grief with waves of depression and anxiety and at times easily overwhelmed but functional.   trial Rexulti 3 mg daily for 2 weeks to see if can gain more benefit for depression and anxiety. If no benefit then reduce Rexulti back to 2 mg.  She'll call  06/01/2020 phone call reporting that 3 mg Rexulti seem to be helping and she wanted to continue it. 06/03/2020, 2 days later she called back stating it was making her too sleepy.  She was planning a trip and she was encouraged to stop the medicine until she returns from her trip and  we would reevaluate. 06/11/2020 phone call stating after stopping the Rexulti she was said, depressed and crying all the time and wanted to return to Rexulti at 1 mg daily to which that was agreed. 07/02/2020 she called back wanting to milligram tablets of Rexulti because she had increased it on her own to that dosage and felt better.  She also had stopped drinking wine in the evening which she felt like helped.  So at her request a prescription was submitted for 2 mg Rexulti to the pharmacy. 07/16/2020 phone call stating she was very lonely and wanted to go back up to 3 mg of Rexulti.  That request was refused because of her previous side effects and making frequent changes with a med with a very long half-life like Rexulti is not a good idea.  07/21/2020 appointment with the following noted: Pretty desperate to do something. She increased Rexulti AMA to 3 mg despite being asked not to do so. Still sleepy and anxious.  Stopped wine for several weeks Ativan 1 -2 mg daily at work bc very anxious. Yesterday when son was there she wasn't sleepy or depressed until he left. Denies drowsiness when driving. Feels no better with grief. Plan: Stop Rexulti DT sleepiness.   Start Latuda 20 mg daily but she refuses bc doesn't think she can eat enough with it.  Then says she'll try it.  Topiramate She would like to increase further; OK increase to 75 mg BID.  No therapy since husband died. Needs counseling desperately.  She commits.  09/22/2020 appointment with the following noted Multiple phone calls since the last visit in crisis. Tried Latuda up to 40 mg a day with 3 mg Ativan daily.  She felt like it made her worse and stopped it. Recommended haloperidol 2 mg tablets 1-2 nightly due to extreme unmanageable anxiety and fearfulness.   08/18/2020 telephone call with the following noted:Tried haloperidol 1 mg and didn't help anything.  No effect except a little sleepiness.  Took it sparingly bc afraid of it.   Afraid of tremors and TD.  I didn't to give it a good chance. Strongly rec ECT.  Currently will consider and agrees to go to [facility] consult.  I'm scared.  Scared of meds too. Currently on sertraline 200 mg daily. Plan: Reduce sertraline to 150 daily and start duloxetine 30 mg capsule daily for 5 days, Then reduce sertraline to 100 mg and increase duloxetine to 2 capsules daily for 5 days, Then reduce sertraline to 1/2 of 100 mg aily and increase duloxetine to 90 mg daily for 5 days then stop sertraline and continue duloxetine 90 mg daily. Disc SE.  09/09/2020 extended phone call in crisis with nurse.  She was still transitioning from sertraline to duloxetine and trazodone was added for sleep. She's refusing ECT consult with [facility]  and didn't go. She has found a new therapist and seen her several times and she's local.  She is hopeful it might help. On duloxetine 90 mg since about 09/02/20. Never took buspirone but did receive it. Takes Ativan. Taking topiramate 75 mg in AM and on 50 mg PM.  Never increased to 75 mg BID. Takes Ambien initially and trazodone 25 mg HS and sleep not as good with duloxetine vs Zoloft.  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  Before that was sobbing and often not staying with herself. FMLA filled out for accomodation for leave if crying spells are unmanageable. Used to call H when she got in the car and son offered to have her call but he's less available.  So often cries in the car but onlly one bad crying spell after Nudexta. Hx migraine tx by neurologist who said MRI showed spots suggesting head injury.  Pt never remembers history of head injury. 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 Tomorrow is her and H's birthday and having family party tonight. Anniversary of 2 years is 10/25 and doing better about staying alone in her house. Uncertain mood effect from change to  duloxetine so far but thinks maybe she's a little better. Doesn't tolerate being alone very well.  Worry about winter. Plan continue Nuedexta was helpful for crying spells though it did not resolve them Continue duloxetine 90 mg to give it more time to work as it is only been about 3 weeks Okay per her request to increase topiramate to 75 mg twice daily.  10/26/2020 appointment with the following noted: Takes Ativan in morning.  Drinks more caffeine at home than work.  Might cause some anxiety.  But anxious at work too over insecurity and takes Ativan in morning also.  Takes it to drive. Average 2-3 Ativan daily.  Less anxious if not alone. On duloxetine 90 for 7 weeks. Crying spells still better on Nudexta than not.  Better than last time. Duloxetine is helping with depression and anxiety both are better than last visit.  Dep 5/10.  Anxiety  6/10 bc of work and driving.  Friends notice the benefit also. Not a winter person and dreads it. Not much appetite in evening but eats good lunch at work and breakfast.  Topiramate has curbed her appetite.   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   Work stressful and hates driving including in the winter and doesn't think she can do it anymore.   Sleep about 5 hours and sometimes can't get back to sleep on trazodone 50 and Ambien and Ativan but doesn't think she can take more trazodone DT hangover.  No amnesia.  Plan: Nuedexta markedly helpful for crying but hasn't resolved it. Duloxetine 90 on 7 weeks with benefit..  Increase dulxetine to max to see if can get more benefit bc sig residual sx. 120 mg daily.  11/25/2020 appointment with the following noted: Hard time. Since here.  Started crying a lot again.  Has been consistent with Nudexta.  Felt good for 3-4 days then last night had neg interaction with daughter while in men's section of [employer].  Got triggered over seeing men's clothes and D got mad.  She was crying and got a disoriented and had trouble  finding her way out.  She doesn't understand her grief.  Crying for weeks and then briefly better and recurred. Plan to retire Feb but scared to stay by herself.  Work  is only socialization she has and it's place she can enjoy things.   Never stayed alone before. I can't stay by myself.  Not afraid but lonely.  No hobbies. Plan: Nuedexta markedly helpful for crying until the last visit it started up again. Reduce Duloxetine to  90 mg bc increase didn't help after 4 weeks and the crying may be worse related to NE. Risperidone 1 mg BID prn   12/28/2020 appointment with the following noted: Was mainly taking gabapentin for pain and it's better sor reduced gabapentin to 2 daily for months.  Forgets to take it at work. Anxiety all day long. Risperidone prn bad crying day only once and it helped.  No SE Crying a lot since here when she is alone at home or in car.  Does not do it at work. Painful cry.  Sad and lonely.  Grief cry.   More anxious driving than she used to be. Using Ativan 1 mg average 2 daily.  Plan: Nuedexta markedly helpful for crying until the last visit it crying worsened again. Continue Duloxetine  90 mg bc increase didn't help after 4 weeks and the crying may be worse related to NE. 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  If fails after a week reduce back to current dose and instead increase Risperidone 1 mg BID.   01/27/2021 appt with following noted: Never  Remembered to take gabapentin QID but could take BID.  No SE. Since here more sad than anxious.  No panic lately. Still crying spells.  Only took risperidone once.  Most of crying in AM and off work at home alone. Is losing weight with topiramate and would like to go higher. Can feel alright when with family. Plan: Reduce duloxetine to 60 mg daily. Start imipramine 1 of the 25 mg tablets nightly for 1 week,  then 2 nightly for 1 week,  then 3 at night for 1 week. Wait 1 week and get the blood  test, preferably in the morning.  02/25/2021 appointment with the following noted: Still crying, horrible yesterday on the way home.  Hopeless.  Still doesn't like being alone.  Has a new counselor.  Was invited to church function tomorrow. SE cotton mouth.  awakens 2-3 times a night.  Negative thoughts about taking so much medicine. Kids and gkids in [place] and can't move away.   Angry over tracking devices.    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   Continue imipramine 75 Hs and duloxetine 60 for now and get blood test ASAP.  03/12/2021 telephone note" :tC to correct instructions on dosing of imipramine. Her blood level of imipramine on imipramine 75 Hs and duloxetine 60 is 108.  The level needs to be about 200.  Therefore have her increase imipramine  to 100 mg for 5 nights, then increase to 150 mg.  When she needs it I will send in a prescription for imipramine 50 mg tablets 3 nightly which is double the current strength of her 25 mg tablets. She should continue the duloxetine 60 mg daily until we can achieve an adequate blood level of imipramine which this increase should accomplish in about 1 week after increasing to 150 mg daily. She understood.  She'll call after on 150 mg for a week and we will repeat the lab. [name] Parents, MD, [facility]  04/20/2021 appointment with the following noted: Got Covid in April.  Back to work  2 weeks.   Took her 2 weeks to recover. On imipramine 150 mg about 3-4 nights but had SE.  Had agitated sleep and moved things around while asleep on this dose.  She backed down to 100 mg  SE dry mouth is uncomfortable. A whole lot of improvement with depression and anxiety on the med overall.  Weather changes will affect her mood.  Still takes Ativan at work.  Driving still bothers her if in traffic. Still very lonely but has gotten out more. 2 different Bible studies good.   D 66 yo is pregnant with her first. Son sees the benefit from  imipramine.  He's FT minister. Less crying.    Past Psychiatric Medication Trials:  Zoloft 300, Lexapro,, paroxetine 80 in 2017, Duloxetine 120 Imipramine 100 Nudexta helped crying for awhile until increase duloxetine and holidays Paliperidone, olanzapine, aripiprazole 20 mg, buspirone, Rexulti 3 mg sleepy clonidine, topiramate, gabapentin, metformin,  phentermine,  trazodone, Ambien, Ativan,  Remote history topiramate for migraine 1998, [place] helped lose 85#  Review of Systems:  Review of Systems  Constitutional: Positive for fatigue. Negative for unexpected weight change.       Sweating  HENT:       Dry mouth  Respiratory: Negative for wheezing.   Cardiovascular: Negative for chest pain and palpitations.  Gastrointestinal: Negative for rectal pain.  Musculoskeletal: Positive for myalgias.  Neurological: Positive for headaches. Negative for dizziness, tremors and weakness.  Psychiatric/Behavioral: Positive for depression and dysphoric mood. Negative for agitation, behavioral problems, confusion, decreased concentration, hallucinations, self-injury, sleep disturbance and suicidal ideas. The patient is nervous/anxious. The patient is not hyperactive.     Medications: I have reviewed the patient's current medications.  Current Outpatient Medications  Medication Sig Dispense Refill  . amLODipine (NORVASC) 5 MG tablet TAKE 1 TABLET BY MOUTH DAILY 90 tablet 0  . amLODipine (NORVASC) 5 MG tablet TAKE 1 TABLET (5 MG TOTAL) BY MOUTH DAILY. 30 tablet 2  . B Complex Vitamins (B-COMPLEX/B-12) LIQD Place 5 mLs under the tongue daily at 2 PM.    . Cholecalciferol (VITAMIN D) 125 MCG (5000 UT) CAPS Take 5,000 Units by mouth daily.    Marland Kitchen desonide (DESOWEN) 0.05 % cream APPLY TO THE AFFECTED AREA(S) ON FACE UP TO TWO TIMES DAILY 30 g 3  . Dextromethorphan-quiNIDine (NUEDEXTA) 20-10 MG capsule TAKE 1 CAPSULE BY MOUTH TWICE A DAY 60 capsule 2  . DULoxetine (CYMBALTA) 30 MG capsule TAKE 3 CAPSULES  (90 MG TOTAL) BY MOUTH DAILY. (Patient taking differently: Take 60 mg by mouth daily.) 270 capsule 0  . fexofenadine (ALLEGRA) 180 MG tablet Take 180 mg by mouth daily.    Marland Kitchen gabapentin (NEURONTIN) 800 MG tablet Take 1 tablet (800 mg total) by mouth 4 (four) times daily. 120 tablet 1  . hydrochlorothiazide (MICROZIDE) 12.5 MG capsule TAKE 1 CAPSULE BY MOUTH ONCE A DAY 90 capsule 0  . imipramine (TOFRANIL) 50 MG tablet Take 3 tablets (150 mg total) by mouth at bedtime. 90 tablet 0  . LORazepam (ATIVAN) 1 MG tablet TAKE 1 TABLET BY MOUTH EVERY 6 HOURS AS NEEDED (Patient taking differently: Take 1 mg by mouth every 6 (six) hours as needed. 1-2 tabs daily.) 60 tablet 1  . metroNIDAZOLE (METROCREAM) 0.75 % cream APPLY TO FACE TWO TIMES DAILY 45 g 0  . mometasone (NASONEX) 50 MCG/ACT nasal spray PLACE 2 SPRAYS INTO THE NOSE DAILY. 17 g 3  . montelukast (SINGULAIR) 10 MG tablet TAKE 1 TABLET BY MOUTH ONCE  A DAY (NEEDS OFFICE VISIT) (Patient taking differently: Take by mouth daily.  (NEEDS OFFICE VISIT)) 90 tablet 0  . nebivolol (BYSTOLIC) 10 MG tablet Take 1 tablet (10 mg total) by mouth 2 (two) times daily. 180 tablet 0  . pantoprazole (PROTONIX) 40 MG tablet TAKE 1 TABLET BY MOUTH 2 TIMES DAILY 60 tablet 2  . thyroid (ARMOUR) 60 MG tablet TAKE 2 TABLETS BY MOUTH ONCE A DAY 60 tablet 1  . topiramate (TOPAMAX) 100 MG tablet TAKE 1 TABLET BY MOUTH 2 TIMES DAILY 180 tablet 0  . Travoprost, BAK Free, (TRAVATAN) 0.004 % SOLN ophthalmic solution Place 1 drop into both eyes at bedtime.    . traZODone (DESYREL) 50 MG tablet TAKE 1 TABLET (50 MG TOTAL) BY MOUTH AT BEDTIME. 90 tablet 0  . zolpidem (AMBIEN) 10 MG tablet TAKE 1/2-1 TABLET BY MOUTH AT BEDTIME AS NEEDED 30 tablet 5  . albuterol (PROAIR HFA) 108 (90 Base) MCG/ACT inhaler Inhale 2 puffs into the lungs every 6 (six) hours as needed for wheezing or shortness of breath. Use as needed only  if your can't catch your breath (Patient not taking: Reported on  04/20/2021) 18 g 3  . estradiol (ESTRACE) 0.5 MG tablet TAKE 1 TABLET BY MOUTH ONCE DAILY (Patient not taking: No sig reported) 30 tablet 3  . progesterone (PROMETRIUM) 100 MG capsule Take 1 capsule (100 mg total) by mouth daily. (Patient not taking: No sig reported) 90 capsule 0   No current facility-administered medications for this visit.    Medication Side Effects: Other: ? sweating related.  Allergies:  Allergies  Allergen Reactions  . Levaquin [Levofloxacin] Other (See Comments)    MUSCLE PAIN  . Nickel Rash    Past Medical History:  Diagnosis Date  . Allergy   . Anxiety   . Bronchiectasis   . Bronchitis    chronic  . 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 COPD (chronic obstructive pulmonary disease) ([place])   . Dental crown present    upper  . Depression   . Environmental allergies    receives allergy shots  . GERD (gastroesophageal reflux disease)   . 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 - no current med.  . Glucose intolerance (impaired glucose tolerance)    on Metformin  . H/O hiatal hernia    "sliding"  . History of echocardiogram    Echo 1/16: EF 60-65, no RWMA, Gr 1 DD, mild LAE, trivial pericardial eff post to heart  . History of echocardiogram    Echo 11/17: EF 55-60, no RWMA, Gr 1 DD, normal RV function.  Marland Kitchen History of palpitations    Holter 12/15: NSR, PACs  . Hx of migraines   . Hypothyroidism   . Osteoarthritis    knees  . Positional headache since 1997   if lies on left side    Family History  Problem Relation Age of Onset  . Lung cancer Father 55       dies age 33  . Emphysema Father   . COPD Brother 75       died age 63  . Stroke Mother   . Hypertension Sister   . Thyroid disease Sister        Graves Disease  . Breast cancer Neg Hx     Social History   Socioeconomic History  . Marital status: Widowed    Spouse name: Not on file  . Number of children: Not on file  .  Years of education: Not on file  . Highest  education level: Not on file  Occupational History  . Occupation: Land    Employer: [employer]  Tobacco Use  . Smoking status: Never Smoker  . Smokeless tobacco: Never Used  Vaping Use  . Vaping Use: Never used  Substance and Sexual Activity  . Alcohol use: Yes    Alcohol/week: 4.0 standard drinks    Types: 4 Glasses of wine per week  . Drug use: No  . Sexual activity: Yes    Partners: Male    Birth control/protection: Post-menopausal  Other Topics Concern  . Not on file  Social History Narrative   Widowed late 2019 after 40+ years of marriage   3 sons one daughter   Working as a Company secretary for [employer]   No/never tobacco no drug use 1 caffeinated beverage daily, occasional or rare glass of wine   Social Determinants of Radio broadcast assistant Strain: Not on file  Food Insecurity: Not on file  Transportation Needs: Not on file  Physical Activity: Not on file  Stress: Not on file  Social Connections: Not on file  Intimate Partner Violence: Not on file    Past Medical History, Surgical history, Social history, and Family history were reviewed and updated as appropriate.   M history of Lewey Body Dementia  Please see review of systems for further details on the patient's review from today.   Objective:   Physical Exam:  LMP 12/12/2005   Physical Exam Constitutional:      General: She is not in acute distress. Musculoskeletal:        General: No deformity.  Neurological:     Mental Status: She is alert and oriented to person, place, and time.     Cranial Nerves: No dysarthria.     Coordination: Coordination normal.  Psychiatric:        Attention and Perception: Attention and perception normal. She does not perceive auditory or visual hallucinations.        Mood and Affect: Mood is anxious and depressed. Affect is not blunt, angry or inappropriate.        Speech: Speech normal.        Behavior: Behavior normal. Behavior is  cooperative.        Thought Content: Thought content normal. Thought content is not paranoid or delusional. Thought content does not include homicidal or suicidal ideation. Thought content does not include homicidal or suicidal plan.        Cognition and Memory: Cognition and memory normal.        Judgment: Judgment normal.     Comments: Insight intact No sign changes Less tearful and less anxious and depressed     Lab Review:     Component Value Date/Time   NA 138 08/27/2020 1126   K 3.4 (L) 08/27/2020 1126   CL 98 08/27/2020 1126   CO2 29 08/27/2020 1126   GLUCOSE 119 (H) 08/27/2020 1126   BUN 19 08/27/2020 1126   CREATININE 0.93 08/27/2020 1126   CALCIUM 9.7 08/27/2020 1126   PROT 6.9 08/27/2020 1126   ALBUMIN 4.4 02/05/2015 0850   AST 17 08/27/2020 1126   ALT 25 08/27/2020 1126   ALKPHOS 53 02/05/2015 0850   BILITOT 0.5 08/27/2020 1126   GFRNONAA 65 08/27/2020 1126   GFRAA 75 08/27/2020 1126       Component Value Date/Time   WBC 8.5 08/27/2020 1126   RBC 4.60  08/27/2020 1126   HGB 14.4 08/27/2020 1126   HCT 42.6 08/27/2020 1126   PLT 293 08/27/2020 1126   MCV 92.6 08/27/2020 1126   MCH 31.3 08/27/2020 1126   MCHC 33.8 08/27/2020 1126   RDW 12.5 08/27/2020 1126   LYMPHSABS 1,148 08/27/2020 1126   MONOABS 0.6 03/23/2018 1244   EOSABS 34 08/27/2020 1126   BASOSABS 17 08/27/2020 1126    No results found for: POCLITH, LITHIUM   No results found for: PHENYTOIN, PHENOBARB, VALPROATE, CBMZ   .res Assessment: Plan:    Samaiah was seen today for follow-up, severe episode of recurrent major depressive disorder, with, depression and anxiety.  Diagnoses and all orders for this visit:  Severe episode of recurrent major depressive disorder, without psychotic features (HCC) -     Imipramine with  desipramine, blood  Generalized anxiety disorder -     Imipramine with  desipramine, blood  Panic disorder with agoraphobia -     Imipramine with  desipramine,  blood  Insomnia due to mental condition  Pseudobulbar affect  Complicated grief  Obstructive sleep apnea     History of ministrokes  Greater than 50% of 30 minface to face time with patient was spent on counseling and coordination of care. We discussed Disc the difference between depression and grief.   Her panic disorder is generally controlled but more anxious somewhat..  Markedly better with imipramine 100 mg combined with duloxetine 60.  Disc DDI.   Repeat level  Nuedexta markedly helpful for crying until recent visit it crying worsened again.  conitnue it. Likely to get worse if stopped.  Continue gabapentin 800 BID if she can remember  for TR anxiety.    Check with pharmacist for dry mouth effects.  Discussed the possibility this could be contributing to weight gain.  Realistically the only alternative would be fluoxetine and it is not as good of an anxiety med.  Her anxiety has been severe in the past.  I would advise against changing or reducing the dose at this time because she has recently had a little more depression and anxiety than baseline.  She is not using the lorazepam excessively.  She is taking it appropriately.  The diazepam is prescribed vaginally by her gynecologist for chronic pelvic pain and not being used. We discussed the short-term risks associated with benzodiazepines including sedation and increased fall risk among others.  Discussed long-term side effect risk including dependence, potential withdrawal symptoms, and the potential eventual dose-related risk of dementia.  Disc the risk of Ambien amnesia.  Option busipirone  Topiramate  100 mg BID.   To help weight and anxiety off label.  It was helping with weight loss and she's tolerating it. Eating more on imipramine.  It also is used for HA.   Naltrexone or metformin or Ozempic  are other options.  Pain is better but not gone and in therapy.  Treated for OSA by Dr. Jetty Duhamel.  Started Needed  counseling.  She commits to continue it..    If retires disc the importance of picking good Medicare D plan that will cover the Nudexta in case it's needed. Also needs to continue social contact.  Weekly grief counselor is good and close to her.  This appt was 30 mins.  FU 4 weeks, make 2 appts DT chronic unstable and desperate  Meredith Staggers, MD, DFAPA  Please see After Visit Summary for patient specific instructions.    Future Appointments  Date Time Provider Department Center  05/17/2021  4:00 PM Cottle, [place]., MD [facility] None    Orders Placed This Encounter  Procedures  . Imipramine with  desipramine, blood      -------------------------------

## 2021-04-26 ENCOUNTER — Other Ambulatory Visit: Payer: Self-pay | Admitting: Psychiatry

## 2021-04-26 ENCOUNTER — Other Ambulatory Visit (HOSPITAL_COMMUNITY): Payer: Self-pay

## 2021-04-26 ENCOUNTER — Other Ambulatory Visit (INDEPENDENT_AMBULATORY_CARE_PROVIDER_SITE_OTHER): Payer: Self-pay | Admitting: Nurse Practitioner

## 2021-04-26 ENCOUNTER — Other Ambulatory Visit (INDEPENDENT_AMBULATORY_CARE_PROVIDER_SITE_OTHER): Payer: Self-pay

## 2021-04-26 DIAGNOSIS — J302 Other seasonal allergic rhinitis: Secondary | ICD-10-CM

## 2021-04-26 DIAGNOSIS — J4540 Moderate persistent asthma, uncomplicated: Secondary | ICD-10-CM

## 2021-04-26 DIAGNOSIS — E559 Vitamin D deficiency, unspecified: Secondary | ICD-10-CM

## 2021-04-26 DIAGNOSIS — F411 Generalized anxiety disorder: Secondary | ICD-10-CM

## 2021-04-26 DIAGNOSIS — F4001 Agoraphobia with panic disorder: Secondary | ICD-10-CM

## 2021-04-26 DIAGNOSIS — I10 Essential (primary) hypertension: Secondary | ICD-10-CM

## 2021-04-26 MED ORDER — TOPIRAMATE 100 MG PO TABS
ORAL_TABLET | Freq: Two times a day (BID) | ORAL | 0 refills | Status: DC
  Filled 2021-04-26: qty 180, 90d supply, fill #0

## 2021-04-26 MED ORDER — MOMETASONE FUROATE 50 MCG/ACT NA SUSP
NASAL | 3 refills | Status: AC
Start: 2021-04-26 — End: 2022-04-26
  Filled 2021-04-26: qty 17, 30d supply, fill #0
  Filled 2021-06-08: qty 17, 30d supply, fill #1
  Filled 2021-08-10: qty 17, 30d supply, fill #2

## 2021-04-27 ENCOUNTER — Other Ambulatory Visit (HOSPITAL_COMMUNITY): Payer: Self-pay

## 2021-04-27 ENCOUNTER — Other Ambulatory Visit (INDEPENDENT_AMBULATORY_CARE_PROVIDER_SITE_OTHER): Payer: Self-pay | Admitting: Internal Medicine

## 2021-04-28 ENCOUNTER — Other Ambulatory Visit (INDEPENDENT_AMBULATORY_CARE_PROVIDER_SITE_OTHER): Payer: Self-pay

## 2021-04-28 ENCOUNTER — Other Ambulatory Visit (HOSPITAL_COMMUNITY): Payer: Self-pay

## 2021-04-28 MED ORDER — NP THYROID 60 MG PO TABS
120.0000 mg | ORAL_TABLET | Freq: Every day | ORAL | 0 refills | Status: DC
  Filled 2021-04-28: qty 60, 30d supply, fill #0

## 2021-04-29 ENCOUNTER — Other Ambulatory Visit (HOSPITAL_COMMUNITY): Payer: Self-pay

## 2021-05-03 ENCOUNTER — Other Ambulatory Visit (HOSPITAL_COMMUNITY): Payer: Self-pay

## 2021-05-04 ENCOUNTER — Other Ambulatory Visit (HOSPITAL_COMMUNITY): Payer: Self-pay

## 2021-05-04 MED ORDER — TRAVOPROST (BAK FREE) 0.004 % OP SOLN
OPHTHALMIC | 0 refills | Status: DC
  Filled 2021-05-04: qty 5, 50d supply, fill #0

## 2021-05-11 ENCOUNTER — Other Ambulatory Visit (HOSPITAL_COMMUNITY): Payer: Self-pay

## 2021-05-12 ENCOUNTER — Other Ambulatory Visit (HOSPITAL_COMMUNITY): Payer: Self-pay

## 2021-05-16 ENCOUNTER — Other Ambulatory Visit: Payer: Self-pay | Admitting: Psychiatry

## 2021-05-16 MED FILL — Zolpidem Tartrate Tab 10 MG: ORAL | 30 days supply | Qty: 30 | Fill #2 | Status: AC

## 2021-05-17 ENCOUNTER — Other Ambulatory Visit: Payer: Self-pay | Admitting: Psychiatry

## 2021-05-17 ENCOUNTER — Other Ambulatory Visit (HOSPITAL_COMMUNITY): Payer: Self-pay

## 2021-05-17 ENCOUNTER — Encounter: Payer: Self-pay | Admitting: Psychiatry

## 2021-05-17 ENCOUNTER — Telehealth (INDEPENDENT_AMBULATORY_CARE_PROVIDER_SITE_OTHER): Payer: 59 | Admitting: Psychiatry

## 2021-05-17 DIAGNOSIS — F4001 Agoraphobia with panic disorder: Secondary | ICD-10-CM

## 2021-05-17 DIAGNOSIS — F4321 Adjustment disorder with depressed mood: Secondary | ICD-10-CM | POA: Diagnosis not present

## 2021-05-17 DIAGNOSIS — F458 Other somatoform disorders: Secondary | ICD-10-CM

## 2021-05-17 DIAGNOSIS — G4733 Obstructive sleep apnea (adult) (pediatric): Secondary | ICD-10-CM | POA: Diagnosis not present

## 2021-05-17 DIAGNOSIS — F332 Major depressive disorder, recurrent severe without psychotic features: Secondary | ICD-10-CM | POA: Diagnosis not present

## 2021-05-17 DIAGNOSIS — F411 Generalized anxiety disorder: Secondary | ICD-10-CM

## 2021-05-17 DIAGNOSIS — F5105 Insomnia due to other mental disorder: Secondary | ICD-10-CM

## 2021-05-17 DIAGNOSIS — F482 Pseudobulbar affect: Secondary | ICD-10-CM | POA: Diagnosis not present

## 2021-05-17 MED ORDER — CYCLOBENZAPRINE HCL 10 MG PO TABS
10.0000 mg | ORAL_TABLET | Freq: Every day | ORAL | 1 refills | Status: DC
  Filled 2021-05-17: qty 30, 30d supply, fill #0

## 2021-05-17 MED ORDER — LORAZEPAM 1 MG PO TABS
ORAL_TABLET | Freq: Four times a day (QID) | ORAL | 1 refills | Status: DC | PRN
  Filled 2021-05-17: qty 60, 15d supply, fill #0
  Filled 2021-09-13: qty 60, 15d supply, fill #1

## 2021-05-17 MED ORDER — DULOXETINE HCL 30 MG PO CPEP
ORAL_CAPSULE | ORAL | 0 refills | Status: DC
  Filled 2021-05-17: qty 270, 90d supply, fill #0

## 2021-05-17 MED ORDER — IMIPRAMINE HCL 50 MG PO TABS
150.0000 mg | ORAL_TABLET | Freq: Every day | ORAL | 0 refills | Status: DC
  Filled 2021-05-17 – 2021-05-18 (×4): qty 90, 30d supply, fill #0

## 2021-05-17 NOTE — Progress Notes (Signed)
[name] [name] [id number] Jul 25, 1956 65 y.o.  Virtual Visit via Telephone Note  I connected with pt by telephone and verified that I am speaking with the correct person using two identifiers.   I discussed the limitations, risks, security and privacy concerns of performing an evaluation and management service by telephone and the availability of in person appointments. I also discussed with the patient that there may be a patient responsible charge related to this service. The patient expressed understanding and agreed to proceed.  I discussed the assessment and treatment plan with the patient. The patient was provided an opportunity to ask questions and all were answered. The patient agreed with the plan and demonstrated an understanding of the instructions.   The patient was advised to call back or seek an in-person evaluation if the symptoms worsen or if the condition fails to improve as anticipated.  I provided 30  minutes of non-face-to-face time during this encounter. The call started at 415 and ended at 445. The patient was located at car in [place] and the provider was located home DT covid in household.  Subjective:   Patient ID:  [name] [name] is a 65 y.o. (DOB [birth year]) female.  Chief Complaint:  Chief Complaint  Patient presents with  . Follow-up  . Severe episode of recurrent major depressive disorder, with  . Anxiety  . Depression    Depression        Associated symptoms include fatigue, myalgias and headaches.  Associated symptoms include no decreased concentration and no suicidal ideas. Medication Refill Associated symptoms include fatigue, headaches and myalgias. Pertinent negatives include no chest pain or weakness.   [name] [name] presents to the office today for follow-up of anxiety.  seen April 2020.  She was dealing with grief and depression and sertraline was increased back to 200 mg daily.  [name] died in 2018-10-12  from cancer.  Married 41  years.  Is able to focus on positive memories.  Not ready to move.  seen August 2020.  No meds were changed.  She continued sertraline 200 mg daily.  seen January 2020.  Sertraline was unchanged.  She was having still grief issues related to the loss of her husband.  She was also having problems with weight attributed to the sertraline.  She was given a trial of topiramate to try to help offset some of the weight gain plus it can have some potential antianxiety effects.  seen 02/19/20 stating: Very depressed. So sad.  Don't want to get OOB.  Got lost coming here and late.  Worse for 3-4 weeks.  Days off are terrible.  Barely able to function at work.  Cry all the way home after work.  So lonely.  Kids aren't coming like they were bc they are busy.  Need to figure out her own life.  Life was centered around H and kids.  Denies suicidal thoughts  Made following med changes: Increase sertraline back to 300 mg daily.  She's aware it's higher than the expected usual dose. Rexulti 2 mg daily Topiramate increase to target 50 mg BID and possibly higher for obesity.  03/18/2020 appointment the following is noted: No meds were changed. She didn't increase sertraline bc fear of weight gain.  Quite a bit different.  No crying.  Sad a few times.  Better in 5 days.  Feels much less depressed.  Better energy, interest, enjoyment.  Also weather helping and started planting.  More motivation.  Desperate to lose  weight.  Has some chronic pain and taking gabapentin.   Started gym cycle and lost some weight.   Worried over D with depression and conflict with pt.  D was always closer to father than her.  D distanced from her and recent bad outburst with her.  D recently blocked communication with her.   Disc getting her help.  05/19/2020 appointment with the following noted: Tried to self medicate bc felt good on Rexulti and tried reducing sertraline to 150 for 2 weeks and got worse.  So increased back to 100 mg  BID. Stopped cable and got too bored for 3 mos.  Restarted cable yesterday.   Still grieving.  Kids want her to sell the house but she can't. Anxiety in the morning fidgety.  Taken more Ativan lately with more panic and worry over the future.  Almost like too much caffeine before any.  Limits it. Better if busy. Does better with working.  Was too sleepy with progesterone. Successful losing weight on the topiramate. Assessment/Plan: Her panic disorder is generally controlled but more anxious somewhat..She is still dealing with a lot of grief with waves of depression and anxiety and at times easily overwhelmed but functional.   trial Rexulti 3 mg daily for 2 weeks to see if can gain more benefit for depression and anxiety. If no benefit then reduce Rexulti back to 2 mg.  She'll call  06/01/2020 phone call reporting that 3 mg Rexulti seem to be helping and she wanted to continue it. 06/03/2020, 2 days later she called back stating it was making her too sleepy.  She was planning a trip and she was encouraged to stop the medicine until she returns from her trip and we would reevaluate. 06/11/2020 phone call stating after stopping the Rexulti she was said, depressed and crying all the time and wanted to return to Rexulti at 1 mg daily to which that was agreed. 07/02/2020 she called back wanting to milligram tablets of Rexulti because she had increased it on her own to that dosage and felt better.  She also had stopped drinking wine in the evening which she felt like helped.  So at her request a prescription was submitted for 2 mg Rexulti to the pharmacy. 07/16/2020 phone call stating she was very lonely and wanted to go back up to 3 mg of Rexulti.  That request was refused because of her previous side effects and making frequent changes with a med with a very long half-life like Rexulti is not a good idea.  07/21/2020 appointment with the following noted: Pretty desperate to do something. She increased Rexulti  AMA to 3 mg despite being asked not to do so. Still sleepy and anxious.  Stopped wine for several weeks Ativan 1 -2 mg daily at work bc very anxious. Yesterday when son was there she wasn't sleepy or depressed until he left. Denies drowsiness when driving. Feels no better with grief. Plan: Stop Rexulti DT sleepiness.   Start Latuda 20 mg daily but she refuses bc doesn't think she can eat enough with it.  Then says she'll try it.  Topiramate She would like to increase further; OK increase to 75 mg BID.  No therapy since husband died. Needs counseling desperately.  She commits.  09/22/2020 appointment with the following noted Multiple phone calls since the last visit in crisis. Tried Latuda up to 40 mg a day with 3 mg Ativan daily.  She felt like it made her worse and stopped it. Recommended  haloperidol 2 mg tablets 1-2 nightly due to extreme unmanageable anxiety and fearfulness.   08/18/2020 telephone call with the following noted:Tried haloperidol 1 mg and didn't help anything.  No effect except a little sleepiness.  Took it sparingly bc afraid of it.  Afraid of tremors and TD.  I didn't to give it a good chance. Strongly rec ECT.  Currently will consider and agrees to go to [facility] for consult.  I'm scared.  Scared of meds too. Currently on sertraline 200 mg daily. Plan: Reduce sertraline to 150 daily and start duloxetine 30 mg capsule daily for 5 days, Then reduce sertraline to 100 mg and increase duloxetine to 2 capsules daily for 5 days, Then reduce sertraline to 1/2 of 100 mg aily and increase duloxetine to 90 mg daily for 5 days then stop sertraline and continue duloxetine 90 mg daily. Disc SE.  09/09/2020 extended phone call in crisis with nurse.  She was still transitioning from sertraline to duloxetine and trazodone was added for sleep. She's refusing ECT consult with [facility] and didn't go. She has found a new therapist and seen her several times and she's local.  She is  hopeful it might help. On duloxetine 90 mg since about 09/02/20. Never took buspirone but did receive it. Takes Ativan. Taking topiramate 75 mg in AM and on 50 mg PM.  Never increased to 75 mg BID. Takes Ambien initially and trazodone 25 mg HS and sleep not as good with duloxetine vs Zoloft.  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  Before that was sobbing and often not staying with herself. FMLA filled out for accomodation for leave if crying spells are unmanageable. Used to call H when she got in the car and son offered to have her call but he's less available.  So often cries in the car but onlly one bad crying spell after Nudexta. Hx migraine tx by neurologist who said MRI showed spots suggesting head injury.  Pt never remembers history of head injury. 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 Tomorrow is her and H's birthday and having family party tonight. Anniversary of 2 years is 10/25 and doing better about staying alone in her house. Uncertain mood effect from change to duloxetine so far but thinks maybe she's a little better. Doesn't tolerate being alone very well.  Worry about winter. Plan continue Nuedexta was helpful for crying spells though it did not resolve them Continue duloxetine 90 mg to give it more time to work as it is only been about 3 weeks Okay per her request to increase topiramate to 75 mg twice daily.  10/26/2020 appointment with the following noted: Takes Ativan in morning.  Drinks more caffeine at home than work.  Might cause some anxiety.  But anxious at work too over insecurity and takes Ativan in morning also.  Takes it to drive. Average 2-3 Ativan daily.  Less anxious if not alone. On duloxetine 90 for 7 weeks. Crying spells still better on Nudexta than not.  Better than last time. Duloxetine is helping with depression and anxiety both are better than last visit.  Dep 5/10.   Anxiety  6/10 bc of work and driving.  Friends notice the benefit also. Not a winter person and dreads it. Not much appetite in evening but eats good lunch at work and breakfast.  Topiramate has curbed her appetite.   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   Work stressful and  hates driving including in the winter and doesn't think she can do it anymore.   Sleep about 5 hours and sometimes can't get back to sleep on trazodone 50 and Ambien and Ativan but doesn't think she can take more trazodone DT hangover.  No amnesia.  Plan: Nuedexta markedly helpful for crying but hasn't resolved it. Duloxetine 90 on 7 weeks with benefit..  Increase dulxetine to max to see if can get more benefit bc sig residual sx. 120 mg daily.  11/25/2020 appointment with the following noted: Hard time. Since here.  Started crying a lot again.  Has been consistent with Nudexta.  Felt good for 3-4 days then last night had neg interaction with daughter while in men's section of [employer].  Got triggered over seeing men's clothes and D got mad.  She was crying and got a disoriented and had trouble finding her way out.  She doesn't understand her grief.  Crying for weeks and then briefly better and recurred. Plan to retire Feb but scared to stay by herself.  Work is only socialization she has and it's place she can enjoy things.   Never stayed alone before. I can't stay by myself.  Not afraid but lonely.  No hobbies. Plan: Nuedexta markedly helpful for crying until the last visit it started up again. Reduce Duloxetine to  90 mg bc increase didn't help after 4 weeks and the crying may be worse related to NE. Risperidone 1 mg BID prn   12/28/2020 appointment with the following noted: Was mainly taking gabapentin for pain and it's better sor reduced gabapentin to 2 daily for months.  Forgets to take it at work. Anxiety all day long. Risperidone prn bad crying day only once and it helped.  No SE Crying a lot since here when she is alone  at home or in car.  Does not do it at work. Painful cry.  Sad and lonely.  Grief cry.   More anxious driving than she used to be. Using Ativan 1 mg average 2 daily.  Plan: Nuedexta markedly helpful for crying until the last visit it crying worsened again. Continue Duloxetine  90 mg bc increase didn't help after 4 weeks and the crying may be worse related to NE. 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  If fails after a week reduce back to current dose and instead increase Risperidone 1 mg BID.   01/27/2021 appt with following noted: Never  Remembered to take gabapentin QID but could take BID.  No SE. Since here more sad than anxious.  No panic lately. Still crying spells.  Only took risperidone once.  Most of crying in AM and off work at home alone. Is losing weight with topiramate and would like to go higher. Can feel alright when with family. Plan: Reduce duloxetine to 60 mg daily. Start imipramine 1 of the 25 mg tablets nightly for 1 week,  then 2 nightly for 1 week,  then 3 at night for 1 week. Wait 1 week and get the blood test, preferably in the morning.  02/25/2021 appointment with the following noted: Still crying, horrible yesterday on the way home.  Hopeless.  Still doesn't like being alone.  Has a new counselor.  Was invited to church function tomorrow. SE cotton mouth.  awakens 2-3 times a night.  Negative thoughts about taking so much medicine. Kids and gkids in [place] and can't move away.   Angry over tracking devices.  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   Continue imipramine 75 Hs and duloxetine 60 for now and get blood test ASAP.  03/12/2021 telephone note" :tC to correct instructions on dosing of imipramine. Her blood level of imipramine on imipramine 75 Hs and duloxetine 60 is 108.  The level needs to be about 200.  Therefore have her increase imipramine  to 100 mg for 5 nights, then increase to 150 mg.   When she needs it I will send in a prescription for imipramine 50 mg tablets 3 nightly which is double the current strength of her 25 mg tablets. She should continue the duloxetine 60 mg daily until we can achieve an adequate blood level of imipramine which this increase should accomplish in about 1 week after increasing to 150 mg daily. She understood.  She'll call after on 150 mg for a week and we will repeat the lab. [name] Parents, MD, [facility]  04/20/2021 appointment with the following noted: Got Covid in April.  Back to work 2 weeks.   Took her 2 weeks to recover. On imipramine 150 mg about 3-4 nights but had SE.  Had agitated sleep and moved things around while asleep on this dose.  She backed down to 100 mg  SE dry mouth is uncomfortable. A whole lot of improvement with depression and anxiety on the med overall.  Weather changes will affect her mood.  Still takes Ativan at work.  Driving still bothers her if in traffic. Still very lonely but has gotten out more. 2 different Bible studies good.   D 66 yo is pregnant with her first. Son sees the benefit from imipramine.  He's FT minister. Less crying.   Plan: Markedly better with imipramine 100 mg combined with duloxetine 60.  Disc DDI.   Repeat level  05/17/2021 appt noted: Didn't get blood level yet.   Emotionally doing OK. CO clenching teeth.  History of mouth guard made a good while ago and started using it again.   Started HA recently develops in the afternoon.  Doesn't have it when awakens.  Son noticed her clenching and grinding teeth.  Usually unaware of grinding teeth in the day.   Sleep is pretty good. Had a little more anxiety situationally.  Taken Ativan a little more. No full panic but some anxiety.   Went to [employer] where best friend is.  She noticed a big improvement.   SE dry mouth and bruxism. Doesn't like imipramine.   Missed one day of Nudexta and duloxetine and had a crying spell.  Otherwise those spells have been  better.  Past Psychiatric Medication Trials:  Zoloft 300, Lexapro,, paroxetine 80 in 2017, Duloxetine 120 Imipramine 150 SE Nudexta helped crying for awhile until increase duloxetine and holidays Paliperidone, olanzapine, aripiprazole 20 mg, buspirone, Rexulti 3 mg sleepy clonidine, topiramate, gabapentin, metformin,  phentermine,  trazodone, Ambien, Ativan,  Remote history topiramate for migraine 1998, [place] helped lose 85#  Review of Systems:  Review of Systems  Constitutional: Positive for fatigue. Negative for unexpected weight change.       Sweating  HENT:       Dry mouth  Respiratory: Negative for wheezing.   Cardiovascular: Negative for chest pain and palpitations.  Gastrointestinal: Negative for rectal pain.  Musculoskeletal: Positive for myalgias.  Neurological: Positive for headaches. Negative for dizziness, tremors and weakness.  Psychiatric/Behavioral: Positive for depression and dysphoric mood. Negative for agitation, behavioral problems, confusion, decreased concentration, hallucinations, self-injury,  sleep disturbance and suicidal ideas. The patient is nervous/anxious. The patient is not hyperactive.     Medications: I have reviewed the patient's current medications.  Current Outpatient Medications  Medication Sig Dispense Refill  . albuterol (PROAIR HFA) 108 (90 Base) MCG/ACT inhaler Inhale 2 puffs into the lungs every 6 (six) hours as needed for wheezing or shortness of breath. Use as needed only  if your can't catch your breath (Patient taking differently: Inhale 2 puffs into the lungs every 6 (six) hours as needed for wheezing or shortness of breath. Use as needed only  if your can't catch your breath) 18 g 3  . amLODipine (NORVASC) 5 MG tablet TAKE 1 TABLET BY MOUTH DAILY 90 tablet 0  . amLODipine (NORVASC) 5 MG tablet TAKE 1 TABLET (5 MG TOTAL) BY MOUTH DAILY. 30 tablet 2  . B Complex Vitamins (B-COMPLEX/B-12) LIQD Place 5 mLs under the tongue daily at 2 PM.     . Cholecalciferol (VITAMIN D) 125 MCG (5000 UT) CAPS Take 5,000 Units by mouth daily.    . cyclobenzaprine (FLEXERIL) 10 MG tablet Take 1 tablet (10 mg total) by mouth at bedtime. 30 tablet 1  . desonide (DESOWEN) 0.05 % cream APPLY TO THE AFFECTED AREA(S) ON FACE UP TO TWO TIMES DAILY 30 g 3  . Dextromethorphan-quiNIDine (NUEDEXTA) 20-10 MG capsule TAKE 1 CAPSULE BY MOUTH TWICE A DAY 60 capsule 2  . DULoxetine (CYMBALTA) 30 MG capsule TAKE 3 CAPSULES (90 MG TOTAL) BY MOUTH DAILY. (Patient taking differently: Take 60 mg by mouth daily.) 270 capsule 0  . fexofenadine (ALLEGRA) 180 MG tablet Take 180 mg by mouth daily.    Marland Kitchen gabapentin (NEURONTIN) 800 MG tablet Take 1 tablet (800 mg total) by mouth 4 (four) times daily. 120 tablet 1  . hydrochlorothiazide (MICROZIDE) 12.5 MG capsule TAKE 1 CAPSULE BY MOUTH ONCE A DAY 90 capsule 0  . imipramine (TOFRANIL) 50 MG tablet Take 3 tablets (150 mg total) by mouth at bedtime. (Patient taking differently: Take 100 mg by mouth at bedtime.) 90 tablet 0  . LORazepam (ATIVAN) 1 MG tablet TAKE 1 TABLET BY MOUTH EVERY 6 HOURS AS NEEDED (Patient taking differently: Take 1 mg by mouth every 6 (six) hours as needed. 1-2 tabs daily.) 60 tablet 1  . metroNIDAZOLE (METROCREAM) 0.75 % cream APPLY TO FACE TWO TIMES DAILY 45 g 0  . mometasone (NASONEX) 50 MCG/ACT nasal spray PLACE 2 SPRAYS INTO THE NOSE DAILY. 17 g 3  . montelukast (SINGULAIR) 10 MG tablet TAKE 1 TABLET BY MOUTH ONCE A DAY (NEEDS OFFICE VISIT) (Patient taking differently: Take by mouth daily.  (NEEDS OFFICE VISIT)) 90 tablet 0  . nebivolol (BYSTOLIC) 10 MG tablet Take 1 tablet (10 mg total) by mouth 2 (two) times daily. 180 tablet 0  . NP THYROID 60 MG tablet Take 2 tablets (120 mg total) by mouth daily. 60 tablet 0  . pantoprazole (PROTONIX) 40 MG tablet TAKE 1 TABLET BY MOUTH 2 TIMES DAILY 60 tablet 2  . topiramate (TOPAMAX) 100 MG tablet TAKE 1 TABLET BY MOUTH 2 TIMES DAILY 180 tablet 0  . Travoprost, BAK  Free, (TRAVATAN) 0.004 % SOLN ophthalmic solution Place 1 drop into both eyes at bedtime.    Marland Kitchen zolpidem (AMBIEN) 10 MG tablet TAKE 1/2-1 TABLET BY MOUTH AT BEDTIME AS NEEDED 30 tablet 5  . estradiol (ESTRACE) 0.5 MG tablet TAKE 1 TABLET BY MOUTH ONCE DAILY (Patient not taking: Reported on 05/17/2021) 30 tablet 3  .  progesterone (PROMETRIUM) 100 MG capsule Take 1 capsule (100 mg total) by mouth daily. (Patient not taking: No sig reported) 90 capsule 0   No current facility-administered medications for this visit.    Medication Side Effects: Other: ? sweating related.  Allergies:  Allergies  Allergen Reactions  . Levaquin [Levofloxacin] Other (See Comments)    MUSCLE PAIN  . Nickel Rash    Past Medical History:  Diagnosis Date  . Allergy   . Anxiety   . Bronchiectasis   . Bronchitis    chronic  . 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 COPD (chronic obstructive pulmonary disease) ([place])   . Dental crown present    upper  . Depression   . Environmental allergies    receives allergy shots  . GERD (gastroesophageal reflux disease)   . 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 - no current med.  . Glucose intolerance (impaired glucose tolerance)    on Metformin  . H/O hiatal hernia    "sliding"  . History of echocardiogram    Echo 1/16: EF 60-65, no RWMA, Gr 1 DD, mild LAE, trivial pericardial eff post to heart  . History of echocardiogram    Echo 11/17: EF 55-60, no RWMA, Gr 1 DD, normal RV function.  Marland Kitchen History of palpitations    Holter 12/15: NSR, PACs  . Hx of migraines   . Hypothyroidism   . Osteoarthritis    knees  . Positional headache since 1997   if lies on left side    Family History  Problem Relation Age of Onset  . Lung cancer Father 66       dies age 30  . Emphysema Father   . COPD Brother 51       died age 68  . Stroke Mother   . Hypertension Sister   . Thyroid disease Sister        Graves Disease  . Breast cancer Neg Hx     Social History    Socioeconomic History  . Marital status: Widowed    Spouse name: Not on file  . Number of children: Not on file  . Years of education: Not on file  . Highest education level: Not on file  Occupational History  . Occupation: Land    Employer: [employer]  Tobacco Use  . Smoking status: Never Smoker  . Smokeless tobacco: Never Used  Vaping Use  . Vaping Use: Never used  Substance and Sexual Activity  . Alcohol use: Yes    Alcohol/week: 4.0 standard drinks    Types: 4 Glasses of wine per week  . Drug use: No  . Sexual activity: Yes    Partners: Male    Birth control/protection: Post-menopausal  Other Topics Concern  . Not on file  Social History Narrative   Widowed late 2019 after 40+ years of marriage   3 sons one daughter   Working as a Company secretary for [employer]   No/never tobacco no drug use 1 caffeinated beverage daily, occasional or rare glass of wine   Social Determinants of Radio broadcast assistant Strain: Not on file  Food Insecurity: Not on file  Transportation Needs: Not on file  Physical Activity: Not on file  Stress: Not on file  Social Connections: Not on file  Intimate Partner Violence: Not on file    Past Medical History, Surgical history, Social history, and Family history were reviewed and updated as appropriate.  M history of Lewey Body Dementia  Please see review of systems for further details on the patient's review from today.   Objective:   Physical Exam:  LMP 12/12/2005   Physical Exam Constitutional:      General: She is not in acute distress. Musculoskeletal:        General: No deformity.  Neurological:     Mental Status: She is alert and oriented to person, place, and time.     Cranial Nerves: No dysarthria.     Coordination: Coordination normal.  Psychiatric:        Attention and Perception: Attention and perception normal. She does not perceive auditory or visual hallucinations.        Mood and  Affect: Mood is anxious and depressed. Affect is not blunt, angry or inappropriate.        Speech: Speech normal.        Behavior: Behavior normal. Behavior is cooperative.        Thought Content: Thought content normal. Thought content is not paranoid or delusional. Thought content does not include homicidal or suicidal ideation. Thought content does not include homicidal or suicidal plan.        Cognition and Memory: Cognition and memory normal.        Judgment: Judgment normal.     Comments: Insight intact No sign changes Less tearful     Lab Review:     Component Value Date/Time   NA 138 08/27/2020 1126   K 3.4 (L) 08/27/2020 1126   CL 98 08/27/2020 1126   CO2 29 08/27/2020 1126   GLUCOSE 119 (H) 08/27/2020 1126   BUN 19 08/27/2020 1126   CREATININE 0.93 08/27/2020 1126   CALCIUM 9.7 08/27/2020 1126   PROT 6.9 08/27/2020 1126   ALBUMIN 4.4 02/05/2015 0850   AST 17 08/27/2020 1126   ALT 25 08/27/2020 1126   ALKPHOS 53 02/05/2015 0850   BILITOT 0.5 08/27/2020 1126   GFRNONAA 65 08/27/2020 1126   GFRAA 75 08/27/2020 1126       Component Value Date/Time   WBC 8.5 08/27/2020 1126   RBC 4.60 08/27/2020 1126   HGB 14.4 08/27/2020 1126   HCT 42.6 08/27/2020 1126   PLT 293 08/27/2020 1126   MCV 92.6 08/27/2020 1126   MCH 31.3 08/27/2020 1126   MCHC 33.8 08/27/2020 1126   RDW 12.5 08/27/2020 1126   LYMPHSABS 1,148 08/27/2020 1126   MONOABS 0.6 03/23/2018 1244   EOSABS 34 08/27/2020 1126   BASOSABS 17 08/27/2020 1126    No results found for: POCLITH, LITHIUM   No results found for: PHENYTOIN, PHENOBARB, VALPROATE, CBMZ   .res Assessment: Plan:    Adabelle was seen today for follow-up, severe episode of recurrent major depressive disorder, with, anxiety and depression.  Diagnoses and all orders for this visit:  Severe episode of recurrent major depressive disorder, without psychotic features ([place])  Generalized anxiety disorder  Panic disorder with  agoraphobia  Insomnia due to mental condition  Pseudobulbar affect  Complicated grief  Obstructive sleep apnea  Bruxism (teeth grinding) -     cyclobenzaprine (FLEXERIL) 10 MG tablet; Take 1 tablet (10 mg total) by mouth at bedtime.     History of ministrokes  Greater than 50% of 30 minface to face time with patient was spent on counseling and coordination of care. We discussed Disc the difference between depression and grief.   Her panic disorder is generally controlled but more anxious somewhat..  Markedly better with imipramine  100 mg combined with duloxetine 60.  Disc DDI.   Repeat level Then consider reducing duloxetine which might be contributing to the SE of bruxism.  Stop trazodone and substitute cyclobenzaprine 10 mg HS for bruxism.  Nuedexta markedly helpful for crying until recent visit it crying worsened again.  conitnue it. Likely to get worse if stopped.  Continue gabapentin 800 BID if she can remember  for TR anxiety.    Check with pharmacist for dry mouth effects.  Discussed the possibility this could be contributing to weight gain.  Realistically the only alternative would be fluoxetine and it is not as good of an anxiety med.  Her anxiety has been severe in the past.  I would advise against changing or reducing the dose at this time because she has recently had a little more depression and anxiety than baseline.  Not very realistic in regard to her ambivalence about the imipramine which is the only thing that has helped.  Addressed this in detail.  She is not using the lorazepam excessively.  She is taking it appropriately.  The diazepam is prescribed vaginally by her gynecologist for chronic pelvic pain and not being used. We discussed the short-term risks associated with benzodiazepines including sedation and increased fall risk among others.  Discussed long-term side effect risk including dependence, potential withdrawal symptoms, and the potential eventual  dose-related risk of dementia.  Disc the risk of Ambien amnesia.  Option busipirone  Topiramate  100 mg BID.   To help weight and anxiety off label.  It was helping with weight loss and she's tolerating it. Eating more on imipramine.  It also is used for HA.   Naltrexone or metformin or Ozempic  are other options.  Pain is better but not gone and in therapy.  Treated for OSA by Dr. Baird Lyons.  Started Needed counseling.  She commits to continue it..    If retires disc the importance of picking good Medicare D plan that will cover the Nudexta in case it's needed. Also needs to continue social contact. May  Later try to taper Nudexta  Weekly grief counselor is good and close to her.  This appt was 30 mins.  FU 4 weeks, make 2 appts DT chronic unstable and desperate  [name] Parents, MD, DFAPA  Please see After Visit Summary for patient specific instructions.    Future Appointments  Date Time Provider [place]  05/24/2021 10:15 AM Doree Albee, MD [place] None  06/15/2021  1:30 PM Cottle, Billey Co., MD [facility] None  07/19/2021  4:00 PM Cottle, Billey Co., MD [facility] None    No orders of the defined types were placed in this encounter.     -------------------------------

## 2021-05-18 ENCOUNTER — Other Ambulatory Visit (HOSPITAL_COMMUNITY): Payer: Self-pay

## 2021-05-19 ENCOUNTER — Other Ambulatory Visit (HOSPITAL_COMMUNITY): Payer: Self-pay

## 2021-05-24 ENCOUNTER — Ambulatory Visit (INDEPENDENT_AMBULATORY_CARE_PROVIDER_SITE_OTHER): Payer: 59 | Admitting: Internal Medicine

## 2021-05-24 ENCOUNTER — Other Ambulatory Visit: Payer: Self-pay

## 2021-05-24 ENCOUNTER — Encounter (INDEPENDENT_AMBULATORY_CARE_PROVIDER_SITE_OTHER): Payer: Self-pay | Admitting: Internal Medicine

## 2021-05-24 VITALS — BP 118/78 | HR 65 | Temp 97.3°F | Ht <= 58 in | Wt 175.8 lb

## 2021-05-24 DIAGNOSIS — Z0001 Encounter for general adult medical examination with abnormal findings: Secondary | ICD-10-CM | POA: Diagnosis not present

## 2021-05-24 DIAGNOSIS — Z131 Encounter for screening for diabetes mellitus: Secondary | ICD-10-CM | POA: Diagnosis not present

## 2021-05-24 DIAGNOSIS — F4329 Adjustment disorder with other symptoms: Secondary | ICD-10-CM | POA: Diagnosis not present

## 2021-05-24 DIAGNOSIS — E039 Hypothyroidism, unspecified: Secondary | ICD-10-CM | POA: Diagnosis not present

## 2021-05-24 DIAGNOSIS — I10 Essential (primary) hypertension: Secondary | ICD-10-CM | POA: Diagnosis not present

## 2021-05-24 DIAGNOSIS — Z13220 Encounter for screening for lipoid disorders: Secondary | ICD-10-CM | POA: Diagnosis not present

## 2021-05-24 DIAGNOSIS — E559 Vitamin D deficiency, unspecified: Secondary | ICD-10-CM

## 2021-05-24 DIAGNOSIS — F4381 Prolonged grief disorder: Secondary | ICD-10-CM

## 2021-05-24 NOTE — Progress Notes (Signed)
Chief Complaint: This 65 year old lady comes in for an annual physical exam and to address her chronic conditions which are described below. HPI: She has hypertension and takes hydrochlorothiazide, amlodipine and Bystolic.  She wonders whether the second drug could be changed because it is going to be expensive when she does retire. She continues with NP thyroid for her hypothyroidism. Although she has been going through the grieving process from the death of her husband in 03/11/2018, she seems to be doing better now.  She is making efforts to meet new people. She continues to see her psychiatrist also.  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 - no current med.   Glucose intolerance (impaired glucose tolerance)    on Metformin   H/O hiatal hernia    "sliding"   History of echocardiogram    Echo 1/16: EF 60-65, no RWMA, Gr 1 DD, mild LAE, trivial pericardial eff post to heart   History of echocardiogram    Echo 11/17: EF 55-60, no RWMA, Gr 1 DD, normal RV function.   History of palpitations    Holter 12/15: NSR, PACs   Hx of migraines    Hypothyroidism    Osteoarthritis    knees   Positional headache since 1996/03/11   if lies on left side   Past Surgical History:  Procedure Laterality Date   CHOLECYSTECTOMY  04/21/2011   CHONDROPLASTY  10/19/2012   Procedure: CHONDROPLASTY;  Surgeon: Yvette Rack., MD;  Location: [facility];  Service: Orthopedics;  Laterality: Right;   COLONOSCOPY     KNEE ARTHROSCOPY  10/19/2012   Procedure: ARTHROSCOPY KNEE;  Surgeon: Yvette Rack., MD;  Location: [facility];  Service:  Orthopedics;  Laterality: Right;   KNEE ARTHROSCOPY Right 02/01/2013   Procedure: RIGHT KNEE ARTHROSCOPY WITH MEDIAL MENISCECTOMY, DEBRIDEMENT CHONDROMALACIA PATELLA;  Surgeon: Yvette Rack., MD;  Location: [facility];  Service: Orthopedics;  Laterality: Right;  RIGHT KNEE ARTHROSCOPY WITH MEDIAL MENISCECTOMY, DEBRIDEMENT CHONDROMALACIA PATELLA   KNEE ARTHROSCOPY Left 03/15/2013   Procedure: LEFT KNEE ARTHROSCOPY WITH DEBRIDEMENT/SHAVING CHONDROPLASTY WITH MEDIAL MENISECTOMY;  Surgeon: Yvette Rack., MD;  Location: [facility];  Service: Orthopedics;  Laterality: Left;   KNEE ARTHROSCOPY WITH LATERAL MENISECTOMY  10/19/2012   Procedure: KNEE ARTHROSCOPY WITH LATERAL MENISECTOMY;  Surgeon: Yvette Rack., MD;  Location: [facility];  Service: Orthopedics;  Laterality: Right;   TONSILLECTOMY AND ADENOIDECTOMY     age 20   TUBAL LIGATION  39   WISDOM TOOTH EXTRACTION       Social History   Social History Narrative   Widowed late 2018-03-11 after 40+ years of marriage   3 sons one daughter   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   No/never tobacco no drug use 1 caffeinated beverage daily, occasional or rare glass of wine    Social History   Tobacco Use   Smoking status: Never   Smokeless tobacco: Never  Substance  Use Topics   Alcohol use: Yes    Comment: occ      Allergies:  Allergies  Allergen Reactions   Levaquin [Levofloxacin] Other (See Comments)    MUSCLE PAIN   Nickel Rash     Current Meds  Medication Sig   acetaminophen (TYLENOL) 500 MG tablet Take by mouth.   albuterol (PROAIR HFA) 108 (90 Base) MCG/ACT inhaler Inhale 2 puffs into the lungs every 6 (six) hours as needed for wheezing or shortness of breath. Use as needed only  if your can't catch your breath (Patient taking differently: Inhale 2 puffs into the lungs every 6 (six) hours as needed for wheezing or shortness of breath. Use as needed  only  if your can't catch your breath)   amLODipine (NORVASC) 5 MG tablet TAKE 1 TABLET (5 MG TOTAL) BY MOUTH DAILY.   B Complex Vitamins (B-COMPLEX/B-12) LIQD Place 5 mLs under the tongue daily at 2 PM.   Cholecalciferol (VITAMIN D) 125 MCG (5000 UT) CAPS Take 5,000 Units by mouth daily.   Cholecalciferol 50 MCG (2000 UT) TABS Take by mouth.   cyclobenzaprine (FLEXERIL) 10 MG tablet Take 1 tablet (10 mg total) by mouth at bedtime.   desonide (DESOWEN) 0.05 % cream APPLY TO THE AFFECTED AREA(S) ON FACE UP TO TWO TIMES DAILY   Dextromethorphan-quiNIDine (NUEDEXTA) 20-10 MG capsule TAKE 1 CAPSULE BY MOUTH TWICE A DAY   DULoxetine (CYMBALTA) 30 MG capsule TAKE 3 CAPSULES (90 MG TOTAL) BY MOUTH DAILY. (Patient taking differently: Take 60 mg by mouth daily.)   fexofenadine (ALLEGRA) 180 MG tablet Take 180 mg by mouth daily.   gabapentin (NEURONTIN) 800 MG tablet Take 1 tablet (800 mg total) by mouth 4 (four) times daily.   hydrochlorothiazide (MICROZIDE) 12.5 MG capsule TAKE 1 CAPSULE BY MOUTH ONCE A DAY   ibuprofen (ADVIL) 200 MG tablet Take by mouth.   ibuprofen (ADVIL) 800 MG tablet ibuprofen 800 mg tablet  TAKE 1 TABLET BY MOUTH THREE TIMES DAILY   imipramine (TOFRANIL) 50 MG tablet Take 3 tablets (150 mg total) by mouth at bedtime.   ipratropium-albuterol (DUONEB) 0.5-2.5 (3) MG/3ML SOLN SMARTSIG:3 Milliliter(s) Via Nebulizer 3 Times Daily   LORazepam (ATIVAN) 1 MG tablet TAKE 1 TABLET BY MOUTH EVERY 6 HOURS AS NEEDED   metroNIDAZOLE (METROCREAM) 0.75 % cream APPLY TO FACE TWO TIMES DAILY   mometasone (NASONEX) 50 MCG/ACT nasal spray PLACE 2 SPRAYS INTO THE NOSE DAILY.   montelukast (SINGULAIR) 10 MG tablet TAKE 1 TABLET BY MOUTH ONCE A DAY (NEEDS OFFICE VISIT)   nebivolol (BYSTOLIC) 10 MG tablet Take 1 tablet (10 mg total) by mouth 2 (two) times daily.   neomycin-polymyxin b-dexamethasone (MAXITROL) 3.5-10000-0.1 OINT Place 1 application into the right eye at bedtime.   NP THYROID 120 MG  tablet Take 120 mg by mouth daily before breakfast.   pantoprazole (PROTONIX) 40 MG tablet TAKE 1 TABLET BY MOUTH 2 TIMES DAILY   topiramate (TOPAMAX) 100 MG tablet TAKE 1 TABLET BY MOUTH 2 TIMES DAILY   Travoprost, BAK Free, (TRAVATAN) 0.004 % SOLN ophthalmic solution Place 1 drop into both eyes at bedtime.   traZODone (DESYREL) 50 MG tablet Take 50 mg by mouth at bedtime.   zolpidem (AMBIEN) 10 MG tablet TAKE 1/2-1 TABLET BY MOUTH AT BEDTIME AS NEEDED   [DISCONTINUED] amLODipine (NORVASC) 5 MG tablet TAKE 1 TABLET BY MOUTH DAILY   [DISCONTINUED] DULoxetine (CYMBALTA) 60 MG capsule Take by mouth.   [DISCONTINUED] NP THYROID 60 MG  tablet Take 2 tablets (120 mg total) by mouth daily.     [facility] Office Visit from 05/24/2021 in [facility]  PHQ-9 Total Score 7       UYQ:IHKVQ from the symptoms mentioned above,there are no other symptoms referable to all systems reviewed.       Physical Exam:CMA Chaperone present Blood pressure 118/78, pulse 65, temperature (!) 97.3 F (36.3 C), temperature source Temporal, height 4\' 10"  (1.473 m), weight 175 lb 12.8 oz (79.7 kg), last menstrual period 12/12/2005, SpO2 97 %. Vitals with BMI 05/24/2021 12/15/2020 10/13/2020  Height 4\' 10"  (No Data) 5\' 1"   Weight 175 lbs 13 oz (No Data) 182 lbs  BMI 25.95 - 63.87  Systolic 564 (No Data) 332  Diastolic 78 (No Data) 70  Pulse 65 - 71      She does look better than I have seen her for a long time.  She remains obese.  Blood pressure is excellent. General: Alert, cooperative, and appears to be the stated age.No pallor.  No jaundice.  No clubbing. Head: Normocephalic Eyes: Sclera white, pupils equal and reactive to light, red reflex x 2,  Ears: Normal bilaterally Oral cavity: Lips, mucosa, and tongue normal: Teeth and gums normal Neck: No adenopathy, supple, symmetrical, trachea midline, and thyroid does not appear enlarged Respiratory: Clear to auscultation bilaterally.No wheezing,  crackles or bronchial breathing. Cardiovascular: Heart sounds are present and appear to be normal without murmurs or added sounds.  No carotid bruits.  Peripheral pulses are present and equal bilaterally.: Gastrointestinal:positive bowel sounds, no hepatosplenomegaly.  No masses felt.No tenderness. Skin: Clear, No rashes noted.No worrisome skin lesions seen. Neurological: Grossly intact without focal findings, cranial nerves II through XII intact, muscle strength equal bilaterally Musculoskeletal: No acute joint abnormalities noted.Full range of movement noted with joints. Psychiatric: Affect appropriate, non-anxious.    Assessment  1. Encounter for general adult medical examination with abnormal findings   2. Hypothyroidism, unspecified type   3. Grief reaction with prolonged bereavement   4. Essential hypertension   5. Vitamin D deficiency disease   6. Screening for diabetes mellitus   7. Screening for lipoid disorders     Tests Ordered:   Orders Placed This Encounter  Procedures   CBC   COMPLETE METABOLIC PANEL WITH GFR   Hemoglobin A1c   Lipid panel   T3, free   T4, free   TSH   VITAMIN D 25 Hydroxy (Vit-D Deficiency, Fractures)      Plan 70.  65 year old lady with hypertension, hypothyroidism and prolonged grief from the death of her husband. 2.  Continue with all antihypertensive medications but I have told her we may discontinue Bystolic and use losartan instead which will end up being cheaper in the future. 3.  Continue with present dose of NP thyroid and we will see if we need to adjust further.  We will check thyroid function today. 4.  Other blood work ordered.  Further recommendations will depend on these blood results. 5.  I will see her probably in about a month's time depending on if I make adjustments to her antihypertensive medications.  Today, in addition to a preventative visit, I performed an office visit to address her hypertension and  hypothyroidism.     No orders of the defined types were placed in this encounter.    Dyllen Menning C Bellarae Lizer   05/24/2021, 10:48 AM

## 2021-05-25 LAB — COMPLETE METABOLIC PANEL WITH GFR
AG Ratio: 1.9 (calc) (ref 1.0–2.5)
ALT: 15 U/L (ref 6–29)
AST: 12 U/L (ref 10–35)
Albumin: 4.1 g/dL (ref 3.6–5.1)
Alkaline phosphatase (APISO): 76 U/L (ref 37–153)
BUN: 20 mg/dL (ref 7–25)
CO2: 28 mmol/L (ref 20–32)
Calcium: 9.3 mg/dL (ref 8.6–10.4)
Chloride: 105 mmol/L (ref 98–110)
Creat: 0.85 mg/dL (ref 0.50–0.99)
GFR, Est African American: 83 mL/min/{1.73_m2} (ref >=60)
GFR, Est Non African American: 72 mL/min/{1.73_m2} (ref >=60)
Globulin: 2.2 g/dL (calc) (ref 1.9–3.7)
Glucose, Bld: 102 mg/dL (ref 65–139)
Potassium: 4.2 mmol/L (ref 3.5–5.3)
Sodium: 140 mmol/L (ref 135–146)
Total Bilirubin: 0.2 mg/dL (ref 0.2–1.2)
Total Protein: 6.3 g/dL (ref 6.1–8.1)

## 2021-05-25 LAB — LIPID PANEL
Cholesterol: 193 mg/dL (ref <200)
HDL: 57 mg/dL (ref >=50)
LDL Cholesterol (Calc): 117 mg/dL (calc) — ABNORMAL HIGH
Non-HDL Cholesterol (Calc): 136 mg/dL (calc) — ABNORMAL HIGH (ref <130)
Total CHOL/HDL Ratio: 3.4 (calc) (ref <5.0)
Triglycerides: 86 mg/dL (ref <150)

## 2021-05-25 LAB — HEMOGLOBIN A1C
Hgb A1c MFr Bld: 5.4 % of total Hgb (ref <5.7)
Mean Plasma Glucose: 108 mg/dL
eAG (mmol/L): 6 mmol/L

## 2021-05-25 LAB — CBC
HCT: 37 % (ref 35.0–45.0)
Hemoglobin: 12.4 g/dL (ref 11.7–15.5)
MCH: 32.4 pg (ref 27.0–33.0)
MCHC: 33.5 g/dL (ref 32.0–36.0)
MCV: 96.6 fL (ref 80.0–100.0)
MPV: 9.7 fL (ref 7.5–12.5)
Platelets: 295 10*3/uL (ref 140–400)
RBC: 3.83 10*6/uL (ref 3.80–5.10)
RDW: 11.8 % (ref 11.0–15.0)
WBC: 4.5 10*3/uL (ref 3.8–10.8)

## 2021-05-25 LAB — TSH: TSH: 0.01 mIU/L — ABNORMAL LOW (ref 0.40–4.50)

## 2021-05-25 LAB — VITAMIN D 25 HYDROXY (VIT D DEFICIENCY, FRACTURES): Vit D, 25-Hydroxy: 90 ng/mL (ref 30–100)

## 2021-05-25 LAB — T4, FREE: Free T4: 1.1 ng/dL (ref 0.8–1.8)

## 2021-05-25 LAB — T3, FREE: T3, Free: 5.3 pg/mL — ABNORMAL HIGH (ref 2.3–4.2)

## 2021-05-28 ENCOUNTER — Other Ambulatory Visit (INDEPENDENT_AMBULATORY_CARE_PROVIDER_SITE_OTHER): Payer: Self-pay | Admitting: Internal Medicine

## 2021-05-28 ENCOUNTER — Other Ambulatory Visit (HOSPITAL_COMMUNITY): Payer: Self-pay

## 2021-05-28 DIAGNOSIS — E559 Vitamin D deficiency, unspecified: Secondary | ICD-10-CM

## 2021-05-28 DIAGNOSIS — J302 Other seasonal allergic rhinitis: Secondary | ICD-10-CM

## 2021-05-28 DIAGNOSIS — J4540 Moderate persistent asthma, uncomplicated: Secondary | ICD-10-CM

## 2021-05-28 DIAGNOSIS — I10 Essential (primary) hypertension: Secondary | ICD-10-CM

## 2021-05-29 ENCOUNTER — Other Ambulatory Visit (HOSPITAL_COMMUNITY): Payer: Self-pay

## 2021-05-29 MED ORDER — MONTELUKAST SODIUM 10 MG PO TABS
ORAL_TABLET | Freq: Every day | ORAL | 0 refills | Status: DC
  Filled 2021-05-29 – 2021-06-08 (×2): qty 90, 90d supply, fill #0

## 2021-05-29 MED ORDER — HYDROCHLOROTHIAZIDE 12.5 MG PO CAPS
ORAL_CAPSULE | Freq: Every day | ORAL | 0 refills | Status: DC
  Filled 2021-05-29 – 2021-06-08 (×2): qty 90, 90d supply, fill #0

## 2021-06-08 ENCOUNTER — Other Ambulatory Visit (INDEPENDENT_AMBULATORY_CARE_PROVIDER_SITE_OTHER): Payer: Self-pay | Admitting: Internal Medicine

## 2021-06-08 ENCOUNTER — Other Ambulatory Visit (HOSPITAL_COMMUNITY): Payer: Self-pay

## 2021-06-08 MED ORDER — NP THYROID 120 MG PO TABS
120.0000 mg | ORAL_TABLET | Freq: Every day | ORAL | 3 refills | Status: DC
  Filled 2021-06-08: qty 30, 30d supply, fill #0
  Filled 2021-07-05: qty 30, 30d supply, fill #1
  Filled 2021-08-10: qty 30, 30d supply, fill #2

## 2021-06-08 MED FILL — Pantoprazole Sodium EC Tab 40 MG (Base Equiv): ORAL | 90 days supply | Qty: 180 | Fill #0 | Status: AC

## 2021-06-09 ENCOUNTER — Other Ambulatory Visit (HOSPITAL_COMMUNITY): Payer: Self-pay

## 2021-06-09 DIAGNOSIS — H25813 Combined forms of age-related cataract, bilateral: Secondary | ICD-10-CM | POA: Diagnosis not present

## 2021-06-09 DIAGNOSIS — H04123 Dry eye syndrome of bilateral lacrimal glands: Secondary | ICD-10-CM | POA: Diagnosis not present

## 2021-06-09 DIAGNOSIS — H401134 Primary open-angle glaucoma, bilateral, indeterminate stage: Secondary | ICD-10-CM | POA: Diagnosis not present

## 2021-06-09 MED ORDER — ERYTHROMYCIN 5 MG/GM OP OINT
TOPICAL_OINTMENT | OPHTHALMIC | 0 refills | Status: DC
  Filled 2021-06-09: qty 3.5, 7d supply, fill #0

## 2021-06-09 MED ORDER — PREDNISOLONE ACETATE 1 % OP SUSP
OPHTHALMIC | 0 refills | Status: DC
  Filled 2021-06-09: qty 5, 21d supply, fill #0

## 2021-06-10 ENCOUNTER — Other Ambulatory Visit (HOSPITAL_COMMUNITY): Payer: Self-pay

## 2021-06-10 ENCOUNTER — Other Ambulatory Visit (HOSPITAL_BASED_OUTPATIENT_CLINIC_OR_DEPARTMENT_OTHER): Payer: Self-pay

## 2021-06-14 ENCOUNTER — Other Ambulatory Visit (HOSPITAL_COMMUNITY): Payer: Self-pay

## 2021-06-14 MED FILL — Zolpidem Tartrate Tab 10 MG: ORAL | 30 days supply | Qty: 30 | Fill #3 | Status: AC

## 2021-06-15 ENCOUNTER — Other Ambulatory Visit (HOSPITAL_COMMUNITY): Payer: Self-pay

## 2021-06-15 ENCOUNTER — Encounter: Payer: Self-pay | Admitting: Psychiatry

## 2021-06-15 ENCOUNTER — Telehealth (INDEPENDENT_AMBULATORY_CARE_PROVIDER_SITE_OTHER): Payer: 59 | Admitting: Psychiatry

## 2021-06-15 DIAGNOSIS — F411 Generalized anxiety disorder: Secondary | ICD-10-CM

## 2021-06-15 DIAGNOSIS — F482 Pseudobulbar affect: Secondary | ICD-10-CM | POA: Diagnosis not present

## 2021-06-15 DIAGNOSIS — G4733 Obstructive sleep apnea (adult) (pediatric): Secondary | ICD-10-CM | POA: Diagnosis not present

## 2021-06-15 DIAGNOSIS — F5105 Insomnia due to other mental disorder: Secondary | ICD-10-CM | POA: Diagnosis not present

## 2021-06-15 DIAGNOSIS — F4321 Adjustment disorder with depressed mood: Secondary | ICD-10-CM

## 2021-06-15 DIAGNOSIS — F332 Major depressive disorder, recurrent severe without psychotic features: Secondary | ICD-10-CM

## 2021-06-15 DIAGNOSIS — F4001 Agoraphobia with panic disorder: Secondary | ICD-10-CM

## 2021-06-15 DIAGNOSIS — F458 Other somatoform disorders: Secondary | ICD-10-CM | POA: Diagnosis not present

## 2021-06-15 MED ORDER — VILAZODONE HCL 20 MG PO TABS
ORAL_TABLET | ORAL | 1 refills | Status: DC
Start: 2021-06-15 — End: 2021-09-08
  Filled 2021-06-15: qty 30, 30d supply, fill #0
  Filled 2021-08-10: qty 30, 30d supply, fill #1

## 2021-06-15 NOTE — Progress Notes (Signed)
[name] [name] [id number] Apr 09, 1956 65 y.o.  Virtual Visit via Telephone Note  I connected with pt by telephone and verified that I am speaking with the correct person using two identifiers.   I discussed the limitations, risks, security and privacy concerns of performing an evaluation and management service by telephone and the availability of in person appointments. I also discussed with the patient that there may be a patient responsible charge related to this service. The patient expressed understanding and agreed to proceed.  I discussed the assessment and treatment plan with the patient. The patient was provided an opportunity to ask questions and all were answered. The patient agreed with the plan and demonstrated an understanding of the instructions.   The patient was advised to call back or seek an in-person evaluation if the symptoms worsen or if the condition fails to improve as anticipated.  I provided 30  minutes of non-face-to-face time during this encounter. The patient was located at car in [place] and the provider was located home.  Session from 130 to 2 PM  Subjective:   Patient ID:  [name] [name] is a 65 y.o. (DOB 23-Oct-1956) female.  Chief Complaint:  Chief Complaint  Patient presents with   Follow-up   Depression   Sleeping Problem   Medication Problem    Depression        Associated symptoms include fatigue, myalgias and headaches.  Associated symptoms include no decreased concentration and no suicidal ideas. Medication Refill Associated symptoms include fatigue, headaches and myalgias. Pertinent negatives include no chest pain or weakness.  [name] [name] presents to the office today for follow-up of anxiety.  seen April 2020.  She was dealing with grief and depression and sertraline was increased back to 200 mg daily.  [name] [name] died in 10/10/18  from cancer.  Married 41 years.  Is able to focus on positive memories.  Not ready to move.  seen  August 2020.  No meds were changed.  She continued sertraline 200 mg daily.  seen January 2020.  Sertraline was unchanged.  She was having still grief issues related to the loss of her husband.  She was also having problems with weight attributed to the sertraline.  She was given a trial of topiramate to try to help offset some of the weight gain plus it can have some potential antianxiety effects.  seen 02/19/20 stating: Very depressed. So sad.  Don't want to get OOB.  Got lost coming here and late.  Worse for 3-4 weeks.  Days off are terrible.  Barely able to function at work.  Cry all the way home after work.  So lonely.  Kids aren't coming like they were bc they are busy.  Need to figure out her own life.  Life was centered around H and kids.  Denies suicidal thoughts  Made following med changes: Increase sertraline back to 300 mg daily.  She's aware it's higher than the expected usual dose. Rexulti 2 mg daily Topiramate increase to target 50 mg BID and possibly higher for obesity.  03/18/2020 appointment the following is noted: No meds were changed. She didn't increase sertraline bc fear of weight gain.  Quite a bit different.  No crying.  Sad a few times.  Better in 5 days.  Feels much less depressed.  Better energy, interest, enjoyment.  Also weather helping and started planting.  More motivation.  Desperate to lose weight.  Has some chronic pain and taking gabapentin.   Started  gym cycle and lost some weight.   Worried over D with depression and conflict with pt.  D was always closer to father than her.  D distanced from her and recent bad outburst with her.  D recently blocked communication with her.   Disc getting her help.  05/19/2020 appointment with the following noted: Tried to self medicate bc felt good on Rexulti and tried reducing sertraline to 150 for 2 weeks and got worse.  So increased back to 100 mg BID. Stopped cable and got too bored for 3 mos.  Restarted cable yesterday.   Still  grieving.  Kids want her to sell the house but she can't. Anxiety in the morning fidgety.  Taken more Ativan lately with more panic and worry over the future.  Almost like too much caffeine before any.  Limits it. Better if busy. Does better with working.  Was too sleepy with progesterone. Successful losing weight on the topiramate. Assessment/Plan: Her panic disorder is generally controlled but more anxious somewhat..She is still dealing with a lot of grief with waves of depression and anxiety and at times easily overwhelmed but functional.   trial Rexulti 3 mg daily for 2 weeks to see if can gain more benefit for depression and anxiety. If no benefit then reduce Rexulti back to 2 mg.  She'll call  06/01/2020 phone call reporting that 3 mg Rexulti seem to be helping and she wanted to continue it. 06/03/2020, 2 days later she called back stating it was making her too sleepy.  She was planning a trip and she was encouraged to stop the medicine until she returns from her trip and we would reevaluate. 06/11/2020 phone call stating after stopping the Rexulti she was said, depressed and crying all the time and wanted to return to Rexulti at 1 mg daily to which that was agreed. 07/02/2020 she called back wanting to milligram tablets of Rexulti because she had increased it on her own to that dosage and felt better.  She also had stopped drinking wine in the evening which she felt like helped.  So at her request a prescription was submitted for 2 mg Rexulti to the pharmacy. 07/16/2020 phone call stating she was very lonely and wanted to go back up to 3 mg of Rexulti.  That request was refused because of her previous side effects and making frequent changes with a med with a very long half-life like Rexulti is not a good idea.  07/21/2020 appointment with the following noted: Pretty desperate to do something. She increased Rexulti AMA to 3 mg despite being asked not to do so. Still sleepy and anxious.  Stopped  wine for several weeks Ativan 1 -2 mg daily at work bc very anxious. Yesterday when son was there she wasn't sleepy or depressed until he left. Denies drowsiness when driving. Feels no better with grief. Plan: Stop Rexulti DT sleepiness.   Start Latuda 20 mg daily but she refuses bc doesn't think she can eat enough with it.  Then says she'll try it.  Topiramate She would like to increase further; OK increase to 75 mg BID.  No therapy since husband died. Needs counseling desperately.  She commits.  09/22/2020 appointment with the following noted Multiple phone calls since the last visit in crisis. Tried Latuda up to 40 mg a day with 3 mg Ativan daily.  She felt like it made her worse and stopped it. Recommended haloperidol 2 mg tablets 1-2 nightly due to extreme unmanageable anxiety and  fearfulness.   08/18/2020 telephone call with the following noted:Tried haloperidol 1 mg and didn't help anything.  No effect except a little sleepiness.  Took it sparingly bc afraid of it.  Afraid of tremors and TD.  I didn't to give it a good chance.  Strongly rec ECT.  Currently will consider and agrees to go to [facility] for consult.  I'm scared.  Scared of meds too.  Currently on sertraline 200 mg daily.  Plan: Reduce sertraline to 150 daily and start duloxetine 30 mg capsule daily for 5 days, Then reduce sertraline to 100 mg and increase duloxetine to 2 capsules daily for 5 days, Then reduce sertraline to 1/2 of 100 mg aily and increase duloxetine to 90 mg daily for 5 days then stop sertraline and continue duloxetine 90 mg daily. Disc SE.  09/09/2020 extended phone call in crisis with nurse.  She was still transitioning from sertraline to duloxetine and trazodone was added for sleep. She's refusing ECT consult with [facility] and didn't go. She has found a new therapist and seen her several times and she's local.  She is hopeful it might help. On duloxetine 90 mg since about 09/02/20. Never took buspirone  but did receive it. Takes Ativan. Taking topiramate 75 mg in AM and on 50 mg PM.  Never increased to 75 mg BID. Takes Ambien initially and trazodone 25 mg HS and sleep not as good with duloxetine vs Zoloft.  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  Before that was sobbing and often not staying with herself. FMLA filled out for accomodation for leave if crying spells are unmanageable. Used to call H when she got in the car and son offered to have her call but he's less available.  So often cries in the car but onlly one bad crying spell after Nudexta. Hx migraine tx by neurologist who said MRI showed spots suggesting head injury.  Pt never remembers history of head injury. 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 Tomorrow is her and H's birthday and having family party tonight. Anniversary of 2 years is 10/25 and doing better about staying alone in her house. Uncertain mood effect from change to duloxetine so far but thinks maybe she's a little better. Doesn't tolerate being alone very well.  Worry about winter. Plan continue Nuedexta was helpful for crying spells though it did not resolve them Continue duloxetine 90 mg to give it more time to work as it is only been about 3 weeks Okay per her request to increase topiramate to 75 mg twice daily.  10/26/2020 appointment with the following noted: Takes Ativan in morning.  Drinks more caffeine at home than work.  Might cause some anxiety.  But anxious at work too over insecurity and takes Ativan in morning also.  Takes it to drive. Average 2-3 Ativan daily.  Less anxious if not alone. On duloxetine 90 for 7 weeks. Crying spells still better on Nudexta than not.  Better than last time. Duloxetine is helping with depression and anxiety both are better than last visit.  Dep 5/10.  Anxiety  6/10 bc of work and driving.  Friends notice the benefit also. Not a winter person  and dreads it. Not much appetite in evening but eats good lunch at work and breakfast.  Topiramate has curbed her appetite.   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   Work stressful and hates driving including in the winter and doesn't think  she can do it anymore.   Sleep about 5 hours and sometimes can't get back to sleep on trazodone 50 and Ambien and Ativan but doesn't think she can take more trazodone DT hangover.  No amnesia.  Plan: Nuedexta markedly helpful for crying but hasn't resolved it. Duloxetine 90 on 7 weeks with benefit..  Increase dulxetine to max to see if can get more benefit bc sig residual sx. 120 mg daily.  11/25/2020 appointment with the following noted: Hard time. Since here.  Started crying a lot again.  Has been consistent with Nudexta.  Felt good for 3-4 days then last night had neg interaction with daughter while in men's section of [employer].  Got triggered over seeing men's clothes and D got mad.  She was crying and got a disoriented and had trouble finding her way out.  She doesn't understand her grief.  Crying for weeks and then briefly better and recurred. Plan to retire Feb but scared to stay by herself.  Work is only socialization she has and it's place she can enjoy things.   Never stayed alone before. I can't stay by myself.  Not afraid but lonely.  No hobbies. Plan: Nuedexta markedly helpful for crying until the last visit it started up again. Reduce Duloxetine to  90 mg bc increase didn't help after 4 weeks and the crying may be worse related to NE. Risperidone 1 mg BID prn   12/28/2020 appointment with the following noted: Was mainly taking gabapentin for pain and it's better sor reduced gabapentin to 2 daily for months.  Forgets to take it at work. Anxiety all day long. Risperidone prn bad crying day only once and it helped.  No SE Crying a lot since here when she is alone at home or in car.  Does not do it at work. Painful cry.  Sad and lonely.  Grief cry.    More anxious driving than she used to be. Using Ativan 1 mg average 2 daily.  Plan: Nuedexta markedly helpful for crying until the last visit it crying worsened again. Continue Duloxetine  90 mg bc increase didn't help after 4 weeks and the crying may be worse related to NE. 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  If fails after a week reduce back to current dose and instead increase Risperidone 1 mg BID.   01/27/2021 appt with following noted: Never  Remembered to take gabapentin QID but could take BID.  No SE. Since here more sad than anxious.  No panic lately. Still crying spells.  Only took risperidone once.  Most of crying in AM and off work at home alone. Is losing weight with topiramate and would like to go higher. Can feel alright when with family. Plan: Reduce duloxetine to 60 mg daily. Start imipramine 1 of the 25 mg tablets nightly for 1 week,  then 2 nightly for 1 week,  then 3 at night for 1 week. Wait 1 week and get the blood test, preferably in the morning.  02/25/2021 appointment with the following noted: Still crying, horrible yesterday on the way home.  Hopeless.  Still doesn't like being alone.  Has a new counselor.  Was invited to church function tomorrow. SE cotton mouth.  awakens 2-3 times a night.  Negative thoughts about taking so much medicine. Kids and gkids in [place] and can't move away.   Angry over tracking devices.    Plan: Continue gabapentin 800 BID if she can  remember  for TR anxiety.   Continue imipramine 75 Hs and duloxetine 60 for now and get blood test ASAP.  03/12/2021 telephone note" :tC to correct instructions on dosing of imipramine.  Her blood level of imipramine on imipramine 75 Hs and duloxetine 60 is 108.  The level needs to be about 200.  Therefore have her increase imipramine  to 100 mg for 5 nights, then increase to 150 mg.  When she needs it I will send in a prescription for imipramine 50 mg tablets 3 nightly  which is double the current strength of her 25 mg tablets. She should continue the duloxetine 60 mg daily until we can achieve an adequate blood level of imipramine which this increase should accomplish in about 1 week after increasing to 150 mg daily.  She understood.  She'll call after on 150 mg for a week and we will repeat the lab.  Lynder Parents, MD, [facility]  04/20/2021 appointment with the following noted: Got Covid in April.  Back to work 2 weeks.   Took her 2 weeks to recover. On imipramine 150 mg about 3-4 nights but had SE.  Had agitated sleep and moved things around while asleep on this dose.  She backed down to 100 mg  SE dry mouth is uncomfortable. A whole lot of improvement with depression and anxiety on the med overall.  Weather changes will affect her mood.  Still takes Ativan at work.  Driving still bothers her if in traffic. Still very lonely but has gotten out more. 2 different Bible studies good.   D 66 yo is pregnant with her first. Son sees the benefit from imipramine.  He's FT minister. Less crying.   Plan: Markedly better with imipramine 100 mg combined with duloxetine 60.  Disc DDI.   Repeat level  05/17/2021 appt noted: Didn't get blood level yet.   Emotionally doing OK. CO clenching teeth.  History of mouth guard made a good while ago and started using it again.   Started HA recently develops in the afternoon.  Doesn't have it when awakens.  Son noticed her clenching and grinding teeth.  Usually unaware of grinding teeth in the day.   Sleep is pretty good. Had a little more anxiety situationally.  Taken Ativan a little more. No full panic but some anxiety.   Went to [employer] where best friend is.  She noticed a big improvement.   SE dry mouth and bruxism. Doesn't like imipramine.   Missed one day of Nudexta and duloxetine and had a crying spell.  Otherwise those spells have been better. Plan: Markedly better with imipramine 100 mg combined with duloxetine 60.  Disc  DDI.   Repeat level Then consider reducing duloxetine which might be contributing to the SE of bruxism. Stop trazodone and substitute cyclobenzaprine 10 mg HS for bruxism.  06/15/2021 appointment with following noted: Eye doc next week with extremely dry eyes from imipramine evidently.  FU next week. Doesn't want to stay on the medicine.  Past Psychiatric Medication Trials:  Zoloft 300, Lexapro,, paroxetine 80 in 2017, Duloxetine 120 Imipramine 150 SE Nudexta helped crying for awhile until increase duloxetine and holidays Paliperidone, olanzapine, aripiprazole 20 mg, buspirone, Rexulti 3 mg sleepy Latuda clonidine, topiramate, gabapentin, metformin,  phentermine,  trazodone, Ambien, Ativan,  Remote history topiramate for migraine 1998, [place] helped lose 85#  Review of Systems:  Review of Systems  Constitutional:  Positive for fatigue. Negative for unexpected weight change.  Sweating  HENT:         Dry mouth  Eyes:  Positive for pain and visual disturbance.  Respiratory:  Negative for wheezing.   Cardiovascular:  Negative for chest pain and palpitations.  Gastrointestinal:  Negative for rectal pain.  Musculoskeletal:  Positive for myalgias.  Neurological:  Positive for headaches. Negative for dizziness, tremors and weakness.  Psychiatric/Behavioral:  Positive for dysphoric mood. Negative for agitation, behavioral problems, confusion, decreased concentration, hallucinations, self-injury, sleep disturbance and suicidal ideas. The patient is nervous/anxious. The patient is not hyperactive.    Medications: I have reviewed the patient's current medications.  Current Outpatient Medications  Medication Sig Dispense Refill   acetaminophen (TYLENOL) 500 MG tablet Take by mouth.     albuterol (PROAIR HFA) 108 (90 Base) MCG/ACT inhaler Inhale 2 puffs into the lungs every 6 (six) hours as needed for wheezing or shortness of breath. Use as needed only  if your can't catch your breath  (Patient taking differently: Inhale 2 puffs into the lungs every 6 (six) hours as needed for wheezing or shortness of breath. Use as needed only  if your can't catch your breath) 18 g 3   amLODipine (NORVASC) 5 MG tablet TAKE 1 TABLET (5 MG TOTAL) BY MOUTH DAILY. 30 tablet 2   B Complex Vitamins (B-COMPLEX/B-12) LIQD Place 5 mLs under the tongue daily at 2 PM.     Cholecalciferol (VITAMIN D) 125 MCG (5000 UT) CAPS Take 5,000 Units by mouth daily.     Cholecalciferol 50 MCG (2000 UT) TABS Take by mouth.     desonide (DESOWEN) 0.05 % cream APPLY TO THE AFFECTED AREA(S) ON FACE UP TO TWO TIMES DAILY 30 g 3   Dextromethorphan-quiNIDine (NUEDEXTA) 20-10 MG capsule TAKE 1 CAPSULE BY MOUTH TWICE A DAY 60 capsule 2   DULoxetine (CYMBALTA) 30 MG capsule TAKE 3 CAPSULES (90 MG TOTAL) BY MOUTH DAILY. (Patient taking differently: Take 60 mg by mouth daily.) 270 capsule 0   erythromycin ophthalmic ointment Apply in right eye at bedtime 3.5 g 0   fexofenadine (ALLEGRA) 180 MG tablet Take 180 mg by mouth daily.     gabapentin (NEURONTIN) 800 MG tablet Take 1 tablet (800 mg total) by mouth 4 (four) times daily. 120 tablet 1   hydrochlorothiazide (MICROZIDE) 12.5 MG capsule TAKE 1 CAPSULE BY MOUTH ONCE A DAY 90 capsule 0   ibuprofen (ADVIL) 200 MG tablet Take by mouth.     ibuprofen (ADVIL) 800 MG tablet ibuprofen 800 mg tablet  TAKE 1 TABLET BY MOUTH THREE TIMES DAILY     ipratropium-albuterol (DUONEB) 0.5-2.5 (3) MG/3ML SOLN SMARTSIG:3 Milliliter(s) Via Nebulizer 3 Times Daily     LORazepam (ATIVAN) 1 MG tablet TAKE 1 TABLET BY MOUTH EVERY 6 HOURS AS NEEDED 60 tablet 1   metroNIDAZOLE (METROCREAM) 0.75 % cream APPLY TO FACE TWO TIMES DAILY 45 g 0   mometasone (NASONEX) 50 MCG/ACT nasal spray PLACE 2 SPRAYS INTO EACH NOSTRIL DAILY. 17 g 3   montelukast (SINGULAIR) 10 MG tablet TAKE 1 TABLET BY MOUTH ONCE A DAY (NEEDS OFFICE VISIT) 90 tablet 0   nebivolol (BYSTOLIC) 10 MG tablet Take 1 tablet (10 mg total) by  mouth 2 (two) times daily. 180 tablet 0   neomycin-polymyxin b-dexamethasone (MAXITROL) 3.5-10000-0.1 OINT Place 1 application into the right eye at bedtime.     NP THYROID 120 MG tablet Take 1 tablet (120 mg total) by mouth daily before breakfast. 30 tablet 3   pantoprazole (PROTONIX) 40  MG tablet TAKE 1 TABLET BY MOUTH 2 TIMES DAILY 60 tablet 2   prednisoLONE acetate (PRED FORTE) 1 % ophthalmic suspension Place 1 drop in right eye 3 times daily for 1 week -- 1 drop twice daily for 1 week-- 1 drop once daily for 1 week 5 mL 0   topiramate (TOPAMAX) 100 MG tablet TAKE 1 TABLET BY MOUTH 2 TIMES DAILY 180 tablet 0   Travoprost, BAK Free, (TRAVATAN) 0.004 % SOLN ophthalmic solution Place 1 drop into both eyes at bedtime.     traZODone (DESYREL) 50 MG tablet Take 50 mg by mouth at bedtime.     Vilazodone HCl (VIIBRYD) 20 MG TABS 1/2 tablet daily for 1 week, then 1 daily with food 30 tablet 1   zolpidem (AMBIEN) 10 MG tablet TAKE 1/2-1 TABLET BY MOUTH AT BEDTIME AS NEEDED 30 tablet 5   No current facility-administered medications for this visit.    Medication Side Effects: Other: ? sweating related.  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 - no current med.   Glucose intolerance (impaired glucose tolerance)    on Metformin   H/O hiatal hernia    "sliding"   History of echocardiogram    Echo 1/16: EF 60-65, no RWMA, Gr 1 DD, mild LAE, trivial pericardial eff post to heart   History of echocardiogram    Echo 11/17: EF  55-60, no RWMA, Gr 1 DD, normal RV function.   History of palpitations    Holter 12/15: NSR, PACs   Hx of migraines    Hypothyroidism    Osteoarthritis    knees   Positional headache since 1997   if lies on left side    Family History  Problem Relation Age of Onset   Lung cancer Father 59       dies age 72   Emphysema Father    COPD Brother 49       died age 1   Stroke Mother    Hypertension Sister    Thyroid disease Sister        Graves Disease   Breast cancer Neg Hx     Social History   Socioeconomic History   Marital status: Widowed    Spouse name: Not on file   Number of children: Not on file   Years of education: Not on file   Highest education level: Not on file  Occupational History   Occupation: Land    Employer: [employer]  Tobacco Use   Smoking status: Never   Smokeless tobacco: Never  Vaping Use   Vaping Use: Never used  Substance and Sexual Activity   Alcohol use: Yes    Comment: occ   Drug use: No   Sexual activity: Yes    Partners: Male    Birth control/protection: Post-menopausal  Other Topics Concern   Not on file  Social History Narrative  Widowed late 2019 after 40+ years of marriage   3 sons one daughter   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   No/never tobacco no drug use 1 caffeinated beverage daily, occasional or rare glass of wine   Social Determinants of Health   Financial Resource Strain: Not on file  Food Insecurity: Not on file  Transportation Needs: Not on file  Physical Activity: Not on file  Stress: Not on file  Social Connections: Not on file  Intimate Partner Violence: Not on file    Past Medical History, Surgical history, Social history, and Family history were reviewed and updated as appropriate.   M history of Lewey Body Dementia  Please see review of systems for further details on the patient's review from today.   Objective:   Physical Exam:  LMP 12/12/2005    Physical Exam Neurological:     Mental Status: She is alert and oriented to person, place, and time.     Cranial Nerves: No dysarthria.  Psychiatric:        Attention and Perception: Attention and perception normal.        Mood and Affect: Mood is anxious and depressed.        Speech: Speech normal.        Behavior: Behavior is cooperative.        Thought Content: Thought content normal. Thought content is not paranoid or delusional. Thought content does not include homicidal or suicidal ideation. Thought content does not include homicidal or suicidal plan.        Cognition and Memory: Cognition and memory normal.        Judgment: Judgment normal.     Comments: Insight fair Current neck worry.  Overall anxiety and depression are markedly better on imipramine than the previous alternatives but there are residual symptoms.    Lab Review:     Component Value Date/Time   NA 140 05/24/2021 1108   K 4.2 05/24/2021 1108   CL 105 05/24/2021 1108   CO2 28 05/24/2021 1108   GLUCOSE 102 05/24/2021 1108   BUN 20 05/24/2021 1108   CREATININE 0.85 05/24/2021 1108   CALCIUM 9.3 05/24/2021 1108   PROT 6.3 05/24/2021 1108   ALBUMIN 4.4 02/05/2015 0850   AST 12 05/24/2021 1108   ALT 15 05/24/2021 1108   ALKPHOS 53 02/05/2015 0850   BILITOT 0.2 05/24/2021 1108   GFRNONAA 72 05/24/2021 1108   GFRAA 83 05/24/2021 1108       Component Value Date/Time   WBC 4.5 05/24/2021 1108   RBC 3.83 05/24/2021 1108   HGB 12.4 05/24/2021 1108   HCT 37.0 05/24/2021 1108   PLT 295 05/24/2021 1108   MCV 96.6 05/24/2021 1108   MCH 32.4 05/24/2021 1108   MCHC 33.5 05/24/2021 1108   RDW 11.8 05/24/2021 1108   LYMPHSABS 1,148 08/27/2020 1126   MONOABS 0.6 03/23/2018 1244   EOSABS 34 08/27/2020 1126   BASOSABS 17 08/27/2020 1126    No results found for: POCLITH, LITHIUM   No results found for: PHENYTOIN, PHENOBARB, VALPROATE, CBMZ   .res Assessment: Plan:    Aily was seen today for follow-up,  depression, sleeping problem and medication problem.  Diagnoses and all orders for this visit:  Severe episode of recurrent major depressive disorder, without psychotic features (HCC) -     Vilazodone HCl (VIIBRYD) 20 MG TABS; 1/2 tablet daily for 1 week, then 1 daily with food  Generalized anxiety disorder  Panic disorder with agoraphobia  Pseudobulbar affect  Complicated grief  Insomnia due to mental condition  Obstructive sleep apnea  Bruxism (teeth grinding)    History of ministrokes  Greater than 50% of 30 minface to face time with patient was spent on counseling and coordination of care. We discussed Disc the difference between depression and grief.   Her panic disorder is generally controlled but more anxious somewhat..  Markedly better with imipramine 100 mg combined with duloxetine 60.  Disc DDI.   Repeat level.  She didn't do it bc doesn't want to stay on it.  Not very realistic in regard to her ambivalence about the imipramine which is the only thing that has helped.  Addressed this in detail.  Disc she has failed to respond to 12 different psychiatric meds.  She is stopping medication that is working.  Discussed there is a high risk of relapse.  One of the unique medications that she has not taken so far as Viibryd.  She has failed to respond to SSRIs SNRIs and various atypicals.  Viibryd has a low risk of causing dryness and is not severe about weight gain. Reduce the imipramine to one of the 50 mg capsules for 1 week and then stop it Start Viibryd 20 mg tablet , start 1/2 tablet for 1 week, then 1 daily with food. Reduce duloxetine to 30 mg daily.  Will attempt to stop this.  DC cyclobenzaprine bc hangover  Nuedexta markedly helpful for crying until recent visit it crying worsened again.  conitnue it. Likely to get worse if stopped.  Continue gabapentin 800 BID if she can remember  for TR anxiety.    She is not using the lorazepam excessively.  She is taking it  appropriately.  The diazepam is prescribed vaginally by her gynecologist for chronic pelvic pain and not being used. We discussed the short-term risks associated with benzodiazepines including sedation and increased fall risk among others.  Discussed long-term side effect risk including dependence, potential withdrawal symptoms, and the potential eventual dose-related risk of dementia.  Disc the risk of Ambien amnesia.  Option busipirone  Topiramate  100 mg BID.   To help weight and anxiety off label.  It was helping with weight loss and she's tolerating it. Eating more on imipramine.  It also is used for HA.   Naltrexone or metformin or Ozempic  are other options.  Pain is better but not gone and in therapy.  Treated for OSA by Dr. Baird Lyons.  Started Needed counseling.  She commits to continue it..    If retires disc the importance of picking good Medicare D plan that will cover the Nudexta in case it's needed. Also needs to continue social contact. May  Later try to taper Nudexta  Weekly grief counselor is good and close to her.  This appt was 30 mins.  FU 4 weeks, make 2 appts DT chronic unstable and desperate  Lynder Parents, MD, DFAPA  Please see After Visit Summary for patient specific instructions.    Future Appointments  Date Time Provider [place]  07/19/2021  4:00 PM Cottle, Billey Co., MD [facility] None    No orders of the defined types were placed in this encounter.     -------------------------------

## 2021-06-16 ENCOUNTER — Other Ambulatory Visit (HOSPITAL_COMMUNITY): Payer: Self-pay

## 2021-06-24 DIAGNOSIS — H25813 Combined forms of age-related cataract, bilateral: Secondary | ICD-10-CM | POA: Diagnosis not present

## 2021-06-24 DIAGNOSIS — H04123 Dry eye syndrome of bilateral lacrimal glands: Secondary | ICD-10-CM | POA: Diagnosis not present

## 2021-06-24 DIAGNOSIS — H401134 Primary open-angle glaucoma, bilateral, indeterminate stage: Secondary | ICD-10-CM | POA: Diagnosis not present

## 2021-06-25 ENCOUNTER — Other Ambulatory Visit (INDEPENDENT_AMBULATORY_CARE_PROVIDER_SITE_OTHER): Payer: Self-pay | Admitting: Internal Medicine

## 2021-06-25 ENCOUNTER — Other Ambulatory Visit (HOSPITAL_COMMUNITY): Payer: Self-pay

## 2021-06-25 DIAGNOSIS — J302 Other seasonal allergic rhinitis: Secondary | ICD-10-CM

## 2021-06-25 DIAGNOSIS — J4540 Moderate persistent asthma, uncomplicated: Secondary | ICD-10-CM

## 2021-06-25 DIAGNOSIS — E559 Vitamin D deficiency, unspecified: Secondary | ICD-10-CM

## 2021-06-25 DIAGNOSIS — I10 Essential (primary) hypertension: Secondary | ICD-10-CM

## 2021-06-25 MED ORDER — NEBIVOLOL HCL 10 MG PO TABS
ORAL_TABLET | Freq: Two times a day (BID) | ORAL | 0 refills | Status: DC
  Filled 2021-06-25: qty 180, 90d supply, fill #0

## 2021-06-25 MED ORDER — TIMOLOL MALEATE 0.5 % OP SOLN
OPHTHALMIC | 3 refills | Status: AC
  Filled 2021-06-25: qty 5, 50d supply, fill #0

## 2021-06-25 MED ORDER — AMLODIPINE BESYLATE 5 MG PO TABS
5.0000 mg | ORAL_TABLET | Freq: Every day | ORAL | 1 refills | Status: DC
  Filled 2021-06-25: qty 90, 90d supply, fill #0
  Filled 2021-09-20: qty 90, 90d supply, fill #1

## 2021-07-05 ENCOUNTER — Other Ambulatory Visit (HOSPITAL_COMMUNITY): Payer: Self-pay

## 2021-07-05 ENCOUNTER — Telehealth: Payer: Self-pay | Admitting: Psychiatry

## 2021-07-05 ENCOUNTER — Other Ambulatory Visit: Payer: Self-pay | Admitting: Psychiatry

## 2021-07-05 NOTE — Telephone Encounter (Signed)
Next visit is 07/19/21. [name] [name] called and states she has never started her Vybrid that she got on her visit 06/15/21. She doesn't want to start it until she sees Dr. Clovis Pu on her next appointment of 07/19/21. She is out of Imipramine and would like the refill called in to:  [facility]  Phone:  [phone]  Fax:  [phone]

## 2021-07-05 NOTE — Telephone Encounter (Signed)
D/C due to side affects but pt is requesting refill

## 2021-07-06 ENCOUNTER — Other Ambulatory Visit: Payer: Self-pay | Admitting: Psychiatry

## 2021-07-06 ENCOUNTER — Other Ambulatory Visit (HOSPITAL_COMMUNITY): Payer: Self-pay

## 2021-07-06 MED ORDER — IMIPRAMINE HCL 50 MG PO TABS
50.0000 mg | ORAL_TABLET | Freq: Two times a day (BID) | ORAL | 0 refills | Status: DC
  Filled 2021-07-06: qty 60, 30d supply, fill #0

## 2021-07-06 NOTE — Telephone Encounter (Signed)
Pt was very snappy with me.Stated all the info was in the note left with laura and I let her know the dose was not mentioned.She demanded someone send her med.She is taking 50 mg BID of the imipramine.She never stopped since her last visit.She is also taking trazodone and cymbalta.

## 2021-07-06 NOTE — Telephone Encounter (Signed)
Is she taking imipramine now?  We thought she stopped it DT SE some time ago.  If she's taking it how long has she been taking it and how much is she taking?

## 2021-07-06 NOTE — Telephone Encounter (Signed)
Sent RX

## 2021-07-07 DIAGNOSIS — H25813 Combined forms of age-related cataract, bilateral: Secondary | ICD-10-CM | POA: Diagnosis not present

## 2021-07-07 DIAGNOSIS — H401134 Primary open-angle glaucoma, bilateral, indeterminate stage: Secondary | ICD-10-CM | POA: Diagnosis not present

## 2021-07-07 DIAGNOSIS — H40041 Steroid responder, right eye: Secondary | ICD-10-CM | POA: Diagnosis not present

## 2021-07-07 DIAGNOSIS — H04123 Dry eye syndrome of bilateral lacrimal glands: Secondary | ICD-10-CM | POA: Diagnosis not present

## 2021-07-12 ENCOUNTER — Other Ambulatory Visit: Payer: Self-pay | Admitting: Psychiatry

## 2021-07-12 ENCOUNTER — Telehealth: Payer: Self-pay | Admitting: Psychiatry

## 2021-07-12 ENCOUNTER — Other Ambulatory Visit (HOSPITAL_COMMUNITY): Payer: Self-pay

## 2021-07-12 DIAGNOSIS — F5105 Insomnia due to other mental disorder: Secondary | ICD-10-CM

## 2021-07-12 NOTE — Telephone Encounter (Signed)
FYI

## 2021-07-12 NOTE — Telephone Encounter (Signed)
It should be noted that [facility] applied for assistance for her Nuedexta.  She applied through [employer].  Since we are handling this application, I don't know the status and cannot answer questions about this assistance program if they arise.

## 2021-07-12 NOTE — Telephone Encounter (Signed)
noted 

## 2021-07-13 ENCOUNTER — Other Ambulatory Visit: Payer: Self-pay

## 2021-07-13 ENCOUNTER — Telehealth: Payer: Self-pay | Admitting: Psychiatry

## 2021-07-13 ENCOUNTER — Other Ambulatory Visit (HOSPITAL_COMMUNITY): Payer: Self-pay

## 2021-07-13 MED ORDER — TRAZODONE HCL 50 MG PO TABS
50.0000 mg | ORAL_TABLET | Freq: Every day | ORAL | 1 refills | Status: DC
  Filled 2021-07-13: qty 90, 90d supply, fill #0
  Filled 2021-10-08: qty 90, 90d supply, fill #1

## 2021-07-13 NOTE — Telephone Encounter (Signed)
Refill of Trazodone sent. Med list was hard to review and confirm taking Trazodone.

## 2021-07-13 NOTE — Telephone Encounter (Signed)
Pt is trying to fill her Trazodone at [facility]. She sees that it is refused and she is not sure why. She only has 1 left. Can we refill it? She is having trouble with teeth grinding and eye troubles. She did not start the Viibryd that was suggested. Is on Imipramine still. She has been taking the Trazodone all along.

## 2021-07-14 ENCOUNTER — Other Ambulatory Visit (HOSPITAL_COMMUNITY): Payer: Self-pay

## 2021-07-16 ENCOUNTER — Other Ambulatory Visit (HOSPITAL_COMMUNITY): Payer: Self-pay

## 2021-07-16 MED FILL — Zolpidem Tartrate Tab 10 MG: ORAL | 30 days supply | Qty: 30 | Fill #4 | Status: AC

## 2021-07-17 ENCOUNTER — Other Ambulatory Visit: Payer: Self-pay | Admitting: Psychiatry

## 2021-07-17 DIAGNOSIS — F482 Pseudobulbar affect: Secondary | ICD-10-CM

## 2021-07-19 ENCOUNTER — Telehealth (INDEPENDENT_AMBULATORY_CARE_PROVIDER_SITE_OTHER): Payer: 59 | Admitting: Psychiatry

## 2021-07-19 ENCOUNTER — Encounter: Payer: Self-pay | Admitting: Psychiatry

## 2021-07-19 ENCOUNTER — Other Ambulatory Visit (HOSPITAL_COMMUNITY): Payer: Self-pay

## 2021-07-19 DIAGNOSIS — F4001 Agoraphobia with panic disorder: Secondary | ICD-10-CM | POA: Diagnosis not present

## 2021-07-19 DIAGNOSIS — F607 Dependent personality disorder: Secondary | ICD-10-CM

## 2021-07-19 DIAGNOSIS — G4733 Obstructive sleep apnea (adult) (pediatric): Secondary | ICD-10-CM

## 2021-07-19 DIAGNOSIS — F4321 Adjustment disorder with depressed mood: Secondary | ICD-10-CM

## 2021-07-19 DIAGNOSIS — F332 Major depressive disorder, recurrent severe without psychotic features: Secondary | ICD-10-CM | POA: Diagnosis not present

## 2021-07-19 DIAGNOSIS — F482 Pseudobulbar affect: Secondary | ICD-10-CM

## 2021-07-19 DIAGNOSIS — F5105 Insomnia due to other mental disorder: Secondary | ICD-10-CM | POA: Diagnosis not present

## 2021-07-19 DIAGNOSIS — F458 Other somatoform disorders: Secondary | ICD-10-CM | POA: Diagnosis not present

## 2021-07-19 DIAGNOSIS — F411 Generalized anxiety disorder: Secondary | ICD-10-CM | POA: Diagnosis not present

## 2021-07-19 MED ORDER — NUEDEXTA 20-10 MG PO CAPS
1.0000 | ORAL_CAPSULE | Freq: Two times a day (BID) | ORAL | 2 refills | Status: DC
  Filled 2021-07-19: qty 60, 30d supply, fill #0
  Filled 2021-08-20: qty 60, 30d supply, fill #1
  Filled 2021-09-13: qty 60, 30d supply, fill #2

## 2021-07-19 NOTE — Progress Notes (Addendum)
[name] [name] [id number] 03/28/1956 65 y.o.  Video Visit via My Chart  I connected with pt by My Chart and verified that I am speaking with the correct person using two identifiers.   I discussed the limitations, risks, security and privacy concerns of performing an evaluation and management service by My Chart  and the availability of in person appointments. I also discussed with the patient that there may be a patient responsible charge related to this service. The patient expressed understanding and agreed to proceed.  I discussed the assessment and treatment plan with the patient. The patient was provided an opportunity to ask questions and all were answered. The patient agreed with the plan and demonstrated an understanding of the instructions.   The patient was advised to call back or seek an in-person evaluation if the symptoms worsen or if the condition fails to improve as anticipated.  I provided 30 minutes of video time during this encounter.  The patient was located at home and the provider was located office.  Session from  4:00 to 4:30 PM  Subjective:   Patient ID:  [name] [name] is a 65 y.o. (DOB 07-02-1956) female.  Chief Complaint:  Chief Complaint  Patient presents with   Follow-up   Depression   Anxiety   Medication Problem   Medication Reaction    Depression        Associated symptoms include fatigue, myalgias and headaches.  Associated symptoms include no decreased concentration and no suicidal ideas. Medication Refill Associated symptoms include fatigue, headaches and myalgias. Pertinent negatives include no chest pain or weakness.  [name] [name] presents to the office today for follow-up of anxiety.  seen April 2020.  She was dealing with grief and depression and sertraline was increased back to 200 mg daily.  [name] [name] died in 10-05-2018  from cancer.  Married 41 years.  Is able to focus on positive memories.  Not ready to move.  seen August  2020.  No meds were changed.  She continued sertraline 200 mg daily.  seen January 2020.  Sertraline was unchanged.  She was having still grief issues related to the loss of her husband.  She was also having problems with weight attributed to the sertraline.  She was given a trial of topiramate to try to help offset some of the weight gain plus it can have some potential antianxiety effects.  seen 02/19/20 stating: Very depressed. So sad.  Don't want to get OOB.  Got lost coming here and late.  Worse for 3-4 weeks.  Days off are terrible.  Barely able to function at work.  Cry all the way home after work.  So lonely.  Kids aren't coming like they were bc they are busy.  Need to figure out her own life.  Life was centered around H and kids.  Denies suicidal thoughts  Made following med changes: Increase sertraline back to 300 mg daily.  She's aware it's higher than the expected usual dose. Rexulti 2 mg daily Topiramate increase to target 50 mg BID and possibly higher for obesity.  03/18/2020 appointment the following is noted: No meds were changed. She didn't increase sertraline bc fear of weight gain.  Quite a bit different.  No crying.  Sad a few times.  Better in 5 days.  Feels much less depressed.  Better energy, interest, enjoyment.  Also weather helping and started planting.  More motivation.  Desperate to lose weight.  Has some chronic pain and  taking gabapentin.   Started gym cycle and lost some weight.   Worried over D with depression and conflict with pt.  D was always closer to father than her.  D distanced from her and recent bad outburst with her.  D recently blocked communication with her.   Disc getting her help.  05/19/2020 appointment with the following noted: Tried to self medicate bc felt good on Rexulti and tried reducing sertraline to 150 for 2 weeks and got worse.  So increased back to 100 mg BID. Stopped cable and got too bored for 3 mos.  Restarted cable yesterday.   Still  grieving.  Kids want her to sell the house but she can't. Anxiety in the morning fidgety.  Taken more Ativan lately with more panic and worry over the future.  Almost like too much caffeine before any.  Limits it. Better if busy. Does better with working.  Was too sleepy with progesterone. Successful losing weight on the topiramate. Assessment/Plan: Her panic disorder is generally controlled but more anxious somewhat..She is still dealing with a lot of grief with waves of depression and anxiety and at times easily overwhelmed but functional.   trial Rexulti 3 mg daily for 2 weeks to see if can gain more benefit for depression and anxiety. If no benefit then reduce Rexulti back to 2 mg.  She'll call  06/01/2020 phone call reporting that 3 mg Rexulti seem to be helping and she wanted to continue it. 06/03/2020, 2 days later she called back stating it was making her too sleepy.  She was planning a trip and she was encouraged to stop the medicine until she returns from her trip and we would reevaluate. 06/11/2020 phone call stating after stopping the Rexulti she was said, depressed and crying all the time and wanted to return to Rexulti at 1 mg daily to which that was agreed. 07/02/2020 she called back wanting to milligram tablets of Rexulti because she had increased it on her own to that dosage and felt better.  She also had stopped drinking wine in the evening which she felt like helped.  So at her request a prescription was submitted for 2 mg Rexulti to the pharmacy. 07/16/2020 phone call stating she was very lonely and wanted to go back up to 3 mg of Rexulti.  That request was refused because of her previous side effects and making frequent changes with a med with a very long half-life like Rexulti is not a good idea.  07/21/2020 appointment with the following noted: Pretty desperate to do something. She increased Rexulti AMA to 3 mg despite being asked not to do so. Still sleepy and anxious.  Stopped  wine for several weeks Ativan 1 -2 mg daily at work bc very anxious. Yesterday when son was there she wasn't sleepy or depressed until he left. Denies drowsiness when driving. Feels no better with grief. Plan: Stop Rexulti DT sleepiness.   Start Latuda 20 mg daily but she refuses bc doesn't think she can eat enough with it.  Then says she'll try it.  Topiramate She would like to increase further; OK increase to 75 mg BID.  No therapy since husband died. Needs counseling desperately.  She commits.  09/22/2020 appointment with the following noted Multiple phone calls since the last visit in crisis. Tried Latuda up to 40 mg a day with 3 mg Ativan daily.  She felt like it made her worse and stopped it. Recommended haloperidol 2 mg tablets 1-2 nightly due  to extreme unmanageable anxiety and fearfulness.   08/18/2020 telephone call with the following noted:Tried haloperidol 1 mg and didn't help anything.  No effect except a little sleepiness.  Took it sparingly bc afraid of it.  Afraid of tremors and TD.  I didn't to give it a good chance.  Strongly rec ECT.  Currently will consider and agrees to go to [facility] for consult.  I'm scared.  Scared of meds too.  Currently on sertraline 200 mg daily.  Plan: Reduce sertraline to 150 daily and start duloxetine 30 mg capsule daily for 5 days, Then reduce sertraline to 100 mg and increase duloxetine to 2 capsules daily for 5 days, Then reduce sertraline to 1/2 of 100 mg aily and increase duloxetine to 90 mg daily for 5 days then stop sertraline and continue duloxetine 90 mg daily. Disc SE.  09/09/2020 extended phone call in crisis with nurse.  She was still transitioning from sertraline to duloxetine and trazodone was added for sleep. She's refusing ECT consult with [facility] and didn't go. She has found a new therapist and seen her several times and she's local.  She is hopeful it might help. On duloxetine 90 mg since about 09/02/20. Never took buspirone  but did receive it. Takes Ativan. Taking topiramate 75 mg in AM and on 50 mg PM.  Never increased to 75 mg BID. Takes Ambien initially and trazodone 25 mg HS and sleep not as good with duloxetine vs Zoloft.  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  Before that was sobbing and often not staying with herself. FMLA filled out for accomodation for leave if crying spells are unmanageable. Used to call H when she got in the car and son offered to have her call but he's less available.  So often cries in the car but onlly one bad crying spell after Nudexta. Hx migraine tx by neurologist who said MRI showed spots suggesting head injury.  Pt never remembers history of head injury. 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 Tomorrow is her and H's birthday and having family party tonight. Anniversary of 2 years is 10/25 and doing better about staying alone in her house. Uncertain mood effect from change to duloxetine so far but thinks maybe she's a little better. Doesn't tolerate being alone very well.  Worry about winter. Plan continue Nuedexta was helpful for crying spells though it did not resolve them Continue duloxetine 90 mg to give it more time to work as it is only been about 3 weeks Okay per her request to increase topiramate to 75 mg twice daily.  10/26/2020 appointment with the following noted: Takes Ativan in morning.  Drinks more caffeine at home than work.  Might cause some anxiety.  But anxious at work too over insecurity and takes Ativan in morning also.  Takes it to drive. Average 2-3 Ativan daily.  Less anxious if not alone. On duloxetine 90 for 7 weeks. Crying spells still better on Nudexta than not.  Better than last time. Duloxetine is helping with depression and anxiety both are better than last visit.  Dep 5/10.  Anxiety  6/10 bc of work and driving.  Friends notice the benefit also. Not a winter person  and dreads it. Not much appetite in evening but eats good lunch at work and breakfast.  Topiramate has curbed her appetite.   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   Work stressful and hates driving including in  the winter and doesn't think she can do it anymore.   Sleep about 5 hours and sometimes can't get back to sleep on trazodone 50 and Ambien and Ativan but doesn't think she can take more trazodone DT hangover.  No amnesia.  Plan: Nuedexta markedly helpful for crying but hasn't resolved it. Duloxetine 90 on 7 weeks with benefit..  Increase dulxetine to max to see if can get more benefit bc sig residual sx. 120 mg daily.  11/25/2020 appointment with the following noted: Hard time. Since here.  Started crying a lot again.  Has been consistent with Nudexta.  Felt good for 3-4 days then last night had neg interaction with daughter while in men's section of [employer].  Got triggered over seeing men's clothes and D got mad.  She was crying and got a disoriented and had trouble finding her way out.  She doesn't understand her grief.  Crying for weeks and then briefly better and recurred. Plan to retire Feb but scared to stay by herself.  Work is only socialization she has and it's place she can enjoy things.   Never stayed alone before. I can't stay by myself.  Not afraid but lonely.  No hobbies. Plan: Nuedexta markedly helpful for crying until the last visit it started up again. Reduce Duloxetine to  90 mg bc increase didn't help after 4 weeks and the crying may be worse related to NE. Risperidone 1 mg BID prn   12/28/2020 appointment with the following noted: Was mainly taking gabapentin for pain and it's better sor reduced gabapentin to 2 daily for months.  Forgets to take it at work. Anxiety all day long. Risperidone prn bad crying day only once and it helped.  No SE Crying a lot since here when she is alone at home or in car.  Does not do it at work. Painful cry.  Sad and lonely.  Grief cry.    More anxious driving than she used to be. Using Ativan 1 mg average 2 daily.  Plan: Nuedexta markedly helpful for crying until the last visit it crying worsened again. Continue Duloxetine  90 mg bc increase didn't help after 4 weeks and the crying may be worse related to NE. 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  If fails after a week reduce back to current dose and instead increase Risperidone 1 mg BID.   01/27/2021 appt with following noted: Never  Remembered to take gabapentin QID but could take BID.  No SE. Since here more sad than anxious.  No panic lately. Still crying spells.  Only took risperidone once.  Most of crying in AM and off work at home alone. Is losing weight with topiramate and would like to go higher. Can feel alright when with family. Plan: Reduce duloxetine to 60 mg daily. Start imipramine 1 of the 25 mg tablets nightly for 1 week,  then 2 nightly for 1 week,  then 3 at night for 1 week. Wait 1 week and get the blood test, preferably in the morning.  02/25/2021 appointment with the following noted: Still crying, horrible yesterday on the way home.  Hopeless.  Still doesn't like being alone.  Has a new counselor.  Was invited to church function tomorrow. SE cotton mouth.  awakens 2-3 times a night.  Negative thoughts about taking so much medicine. Kids and gkids in [place] and can't move away.   Angry over tracking devices.    Plan: Continue gabapentin  800 BID if she can remember  for TR anxiety.   Continue imipramine 75 Hs and duloxetine 60 for now and get blood test ASAP.  03/12/2021 telephone note" :tC to correct instructions on dosing of imipramine.  Her blood level of imipramine on imipramine 75 Hs and duloxetine 60 is 108.  The level needs to be about 200.  Therefore have her increase imipramine  to 100 mg for 5 nights, then increase to 150 mg.  When she needs it I will send in a prescription for imipramine 50 mg tablets 3 nightly  which is double the current strength of her 25 mg tablets. She should continue the duloxetine 60 mg daily until we can achieve an adequate blood level of imipramine which this increase should accomplish in about 1 week after increasing to 150 mg daily.  She understood.  She'll call after on 150 mg for a week and we will repeat the lab.  Lynder Parents, MD, [facility]  04/20/2021 appointment with the following noted: Got Covid in April.  Back to work 2 weeks.   Took her 2 weeks to recover. On imipramine 150 mg about 3-4 nights but had SE.  Had agitated sleep and moved things around while asleep on this dose.  She backed down to 100 mg  SE dry mouth is uncomfortable. A whole lot of improvement with depression and anxiety on the med overall.  Weather changes will affect her mood.  Still takes Ativan at work.  Driving still bothers her if in traffic. Still very lonely but has gotten out more. 2 different Bible studies good.   D 67 yo is pregnant with her first. Son sees the benefit from imipramine.  He's FT minister. Less crying.   Plan: Markedly better with imipramine 100 mg combined with duloxetine 60.  Disc DDI.   Repeat level  05/17/2021 appt noted: Didn't get blood level yet.   Emotionally doing OK. CO clenching teeth.  History of mouth guard made a good while ago and started using it again.   Started HA recently develops in the afternoon.  Doesn't have it when awakens.  Son noticed her clenching and grinding teeth.  Usually unaware of grinding teeth in the day.   Sleep is pretty good. Had a little more anxiety situationally.  Taken Ativan a little more. No full panic but some anxiety.   Went to [employer] where best friend is.  She noticed a big improvement.   SE dry mouth and bruxism. Doesn't like imipramine.   Missed one day of Nudexta and duloxetine and had a crying spell.  Otherwise those spells have been better. Plan: Markedly better with imipramine 100 mg combined with duloxetine 60.  Disc  DDI.   Repeat level Then consider reducing duloxetine which might be contributing to the SE of bruxism. Stop trazodone and substitute cyclobenzaprine 10 mg HS for bruxism.  06/15/2021 appointment with following noted: Eye doc next week with extremely dry eyes from imipramine evidently.  FU next week. Doesn't want to stay on the medicine.  Disc her frustrations with the med even though she acknowledges the imipramine has helped her.  Residual depression and anxiety and chronic grief remained with fears about being alone.  She has been able to continue to work. Plan: Extensive discussion around the risk of stopping imipramine which is the only thing that has helped her recently.  Discussed alternatives. Reduce the imipramine to one of the 50 mg capsules for 1 week and then stop it Start  Viibryd 20 mg tablet , start 1/2 tablet for 1 week, then 1 daily with food. Reduce duloxetine to 30 mg daily.  Will attempt to stop this.  DC cyclobenzaprine bc hangover  07/05/2021 phone call asking for refill of imipramine.  When questioned about it she indicated she never stopped taking it despite what she indicated at the last visit.  07/19/2021 appointment with the following noted: Still on Cymbalta 60 mg daily, gabapentin 800 mg average twice daily bc forgets the rest, Lorazepam 1 mg average rarely. Topomax 100 mg BID, trazodone 50 mg HS, ambien 10 HS.   Not taken any Viibryd. Never stopped imipramine bc talked to son's who said she was so much better with the imipramine.  Talked to eye doctor which is better and maybe related to shingles.   Seeing dentist today with problems with teeth bc grinding her teeth. Crying all the time getting worse in the last few weeks.  Son's getting burned out on her neediness and she's not going to see him as frequently and so he's less available which has made her more depressed and tearful. Had stopped therapy but plans to restart it bc needed.  I need to get out of the house.   Lonely. Has not checked on local support group lately. Depression is worse than last visit.  Didn't feel this level of depression last time but is experienced as extreme loneliness.  Neglecting house hold chores. Tries to talk on phone nightly with someone.  Past Psychiatric Medication Trials:  Zoloft 300, Lexapro,, paroxetine 80 in 2017, Duloxetine 120 Imipramine 150 SE Nudexta helped crying for awhile until increase duloxetine and holidays Paliperidone, olanzapine, aripiprazole 20 mg, Rexulti 3 mg sleepy Latuda buspirone,  clonidine, topiramate, gabapentin, metformin,  phentermine,  trazodone, Ambien, Ativan,  Remote history topiramate for migraine 1998, [place] helped lose 85#  Review of Systems:  Review of Systems  Constitutional:  Positive for activity change and fatigue. Negative for unexpected weight change.       Sweating  HENT:         Dry mouth  Eyes:  Positive for pain and visual disturbance.  Respiratory:  Negative for wheezing.   Cardiovascular:  Negative for chest pain and palpitations.  Gastrointestinal:  Negative for rectal pain.  Musculoskeletal:  Positive for myalgias.  Neurological:  Positive for headaches. Negative for dizziness, tremors and weakness.  Psychiatric/Behavioral:  Positive for behavioral problems and dysphoric mood. Negative for agitation, confusion, decreased concentration, hallucinations, self-injury, sleep disturbance and suicidal ideas. The patient is nervous/anxious. The patient is not hyperactive.    Medications: I have reviewed the patient's current medications.  Current Outpatient Medications  Medication Sig Dispense Refill   acetaminophen (TYLENOL) 500 MG tablet Take by mouth.     albuterol (PROAIR HFA) 108 (90 Base) MCG/ACT inhaler Inhale 2 puffs into the lungs every 6 (six) hours as needed for wheezing or shortness of breath. Use as needed only  if your can't catch your breath (Patient taking differently: Inhale 2 puffs into the lungs  every 6 (six) hours as needed for wheezing or shortness of breath. Use as needed only  if your can't catch your breath) 18 g 3   amLODipine (NORVASC) 5 MG tablet TAKE 1 TABLET (5 MG TOTAL) BY MOUTH DAILY. 90 tablet 1   B Complex Vitamins (B-COMPLEX/B-12) LIQD Place 5 mLs under the tongue daily at 2 PM.     Cholecalciferol (VITAMIN D) 125 MCG (5000 UT) CAPS Take 5,000 Units by mouth daily.  Cholecalciferol 50 MCG (2000 UT) TABS Take by mouth.     desonide (DESOWEN) 0.05 % cream APPLY TO THE AFFECTED AREA(S) ON FACE UP TO TWO TIMES DAILY 30 g 3   Dextromethorphan-quiNIDine (NUEDEXTA) 20-10 MG capsule TAKE 1 CAPSULE BY MOUTH TWICE A DAY 60 capsule 2   DULoxetine (CYMBALTA) 30 MG capsule TAKE 3 CAPSULES (90 MG TOTAL) BY MOUTH DAILY. (Patient taking differently: Take 60 mg by mouth daily.) 270 capsule 0   erythromycin ophthalmic ointment Apply in right eye at bedtime 3.5 g 0   fexofenadine (ALLEGRA) 180 MG tablet Take 180 mg by mouth daily.     gabapentin (NEURONTIN) 800 MG tablet Take 1 tablet (800 mg total) by mouth 4 (four) times daily. (Patient taking differently: Take 800 mg by mouth 4 (four) times daily. Average 2 daily) 120 tablet 1   hydrochlorothiazide (MICROZIDE) 12.5 MG capsule TAKE 1 CAPSULE BY MOUTH ONCE A DAY 90 capsule 0   ibuprofen (ADVIL) 200 MG tablet Take by mouth.     ibuprofen (ADVIL) 800 MG tablet ibuprofen 800 mg tablet  TAKE 1 TABLET BY MOUTH THREE TIMES DAILY     imipramine (TOFRANIL) 50 MG tablet Take 1 tablet (50 mg total) by mouth 2 (two) times daily. 60 tablet 0   ipratropium-albuterol (DUONEB) 0.5-2.5 (3) MG/3ML SOLN SMARTSIG:3 Milliliter(s) Via Nebulizer 3 Times Daily     LORazepam (ATIVAN) 1 MG tablet TAKE 1 TABLET BY MOUTH EVERY 6 HOURS AS NEEDED 60 tablet 1   metroNIDAZOLE (METROCREAM) 0.75 % cream APPLY TO FACE TWO TIMES DAILY 45 g 0   mometasone (NASONEX) 50 MCG/ACT nasal spray PLACE 2 SPRAYS INTO EACH NOSTRIL DAILY. 17 g 3   montelukast (SINGULAIR) 10 MG tablet  TAKE 1 TABLET BY MOUTH ONCE A DAY (NEEDS OFFICE VISIT) 90 tablet 0   nebivolol (BYSTOLIC) 10 MG tablet Take 1 tablet (10 mg total) by mouth 2 (two) times daily. 180 tablet 0   nebivolol (BYSTOLIC) 10 MG tablet TAKE 1 TABLET BY MOUTH 2 TIMES DAILY 180 tablet 0   neomycin-polymyxin b-dexamethasone (MAXITROL) 3.5-10000-0.1 OINT Place 1 application into the right eye at bedtime.     NP THYROID 120 MG tablet Take 1 tablet (120 mg total) by mouth daily before breakfast. 30 tablet 3   pantoprazole (PROTONIX) 40 MG tablet TAKE 1 TABLET BY MOUTH 2 TIMES DAILY 60 tablet 2   prednisoLONE acetate (PRED FORTE) 1 % ophthalmic suspension Place 1 drop in right eye 3 times daily for 1 week -- 1 drop twice daily for 1 week-- 1 drop once daily for 1 week 5 mL 0   timolol (TIMOPTIC) 0.5 % ophthalmic solution Instill 1 drop in right eye twice a day 5 mL 3   topiramate (TOPAMAX) 100 MG tablet TAKE 1 TABLET BY MOUTH 2 TIMES DAILY 180 tablet 0   Travoprost, BAK Free, (TRAVATAN) 0.004 % SOLN ophthalmic solution Place 1 drop into both eyes at bedtime.     traZODone (DESYREL) 50 MG tablet Take 1 tablet (50 mg total) by mouth at bedtime. 90 tablet 1   zolpidem (AMBIEN) 10 MG tablet TAKE 1/2-1 TABLET BY MOUTH AT BEDTIME AS NEEDED 30 tablet 5   Vilazodone HCl (VIIBRYD) 20 MG TABS Take 1/2 tablet by mouth once daily for 1 week, then take 1 tablet once daily--take with food (Patient not taking: Reported on 07/19/2021) 30 tablet 1   No current facility-administered medications for this visit.    Medication Side Effects: Other: ? sweating  related.  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 - no current med.   Glucose intolerance (impaired glucose tolerance)    on Metformin   H/O hiatal hernia    "sliding"   History of echocardiogram    Echo 1/16: EF 60-65, no RWMA, Gr 1 DD, mild LAE, trivial pericardial eff post to heart   History of echocardiogram    Echo 11/17: EF 55-60, no RWMA, Gr 1 DD, normal RV function.   History of palpitations    Holter 12/15: NSR, PACs   Hx of migraines    Hypothyroidism    Osteoarthritis    knees   Positional headache since 1997   if lies on left side    Family History  Problem Relation Age of Onset   Lung cancer Father 43       dies age 37   Emphysema Father    COPD Brother 54       died age 55   Stroke Mother    Hypertension Sister    Thyroid disease Sister        Graves Disease   Breast cancer Neg Hx     Social History   Socioeconomic History   Marital status: Widowed    Spouse name: Not on file   Number of children: Not on file   Years of education: Not on file   Highest education level: Not on file  Occupational History   Occupation: Land    Employer: [employer]  Tobacco Use   Smoking status: Never   Smokeless tobacco: Never  Vaping Use   Vaping Use: Never used  Substance and Sexual Activity   Alcohol use: Yes    Comment: occ   Drug use: No   Sexual activity: Yes    Partners: Male    Birth control/protection: Post-menopausal  Other Topics Concern   Not on file  Social History Narrative   Widowed late 2019 after 40+ years of marriage   3 sons one daughter   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   No/never tobacco no drug use 1 caffeinated beverage daily, occasional or rare glass of wine   Social Determinants of Health   Financial Resource Strain: Not on file  Food Insecurity: Not on file  Transportation Needs: Not on file   Physical Activity: Not on file  Stress: Not on file  Social Connections: Not on file  Intimate Partner Violence: Not on file    Past Medical History, Surgical history, Social history, and Family history were reviewed and updated as appropriate.   M history of Lewey Body Dementia  Please see review of systems for further details on the patient's review from today.   Objective:   Physical Exam:  LMP 12/12/2005   Physical Exam Neurological:     Mental Status: She is alert and oriented to person,  place, and time.     Cranial Nerves: No dysarthria.  Psychiatric:        Attention and Perception: Attention and perception normal.        Mood and Affect: Mood is anxious and depressed. Affect is tearful.        Speech: Speech normal.        Behavior: Behavior is cooperative.        Thought Content: Thought content normal. Thought content is not paranoid or delusional. Thought content does not include homicidal or suicidal ideation. Thought content does not include homicidal or suicidal plan.        Cognition and Memory: Cognition and memory normal.        Judgment: Judgment normal.     Comments: Insight fair Depression and crying worse again now that family has withdrawn from her. More ruminative again and more tearful. Crying throughout phone call again as in the past dwelling on being lonely. Scared.     Lab Review:     Component Value Date/Time   NA 140 05/24/2021 1108   K 4.2 05/24/2021 1108   CL 105 05/24/2021 1108   CO2 28 05/24/2021 1108   GLUCOSE 102 05/24/2021 1108   BUN 20 05/24/2021 1108   CREATININE 0.85 05/24/2021 1108   CALCIUM 9.3 05/24/2021 1108   PROT 6.3 05/24/2021 1108   ALBUMIN 4.4 02/05/2015 0850   AST 12 05/24/2021 1108   ALT 15 05/24/2021 1108   ALKPHOS 53 02/05/2015 0850   BILITOT 0.2 05/24/2021 1108   GFRNONAA 72 05/24/2021 1108   GFRAA 83 05/24/2021 1108       Component Value Date/Time   WBC 4.5 05/24/2021 1108   RBC 3.83 05/24/2021 1108    HGB 12.4 05/24/2021 1108   HCT 37.0 05/24/2021 1108   PLT 295 05/24/2021 1108   MCV 96.6 05/24/2021 1108   MCH 32.4 05/24/2021 1108   MCHC 33.5 05/24/2021 1108   RDW 11.8 05/24/2021 1108   LYMPHSABS 1,148 08/27/2020 1126   MONOABS 0.6 03/23/2018 1244   EOSABS 34 08/27/2020 1126   BASOSABS 17 08/27/2020 1126    No results found for: POCLITH, LITHIUM   No results found for: PHENYTOIN, PHENOBARB, VALPROATE, CBMZ   .res Assessment: Plan:    Adja was seen today for follow-up, depression, anxiety, medication problem and medication reaction.  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 We discussed Disc the difference between depression and grief.  And her ongoing treatment resistant depression and anxiety.  Her panic disorder is generally controlled but more anxious somewhat..  Markedly better with imipramine 100 mg combined with duloxetine 60.  However, she Relapsed again after son distanced emotionally from her so she's more alone. Dependency is markedly complicating treatment.  Disc she has failed to respond to 12 different psychiatric meds.  She is stopping medication that is working.  Discussed there is a high risk of relapse.    Discussed alternatives to imipramine.  One of the unique medications that she has not taken so far as Viibryd.  She has failed to respond to  SSRIs SNRIs and various atypicals.  Viibryd has a low risk of causing dryness and is not severe about weight gain.  She agrees to this change.  She feels she  needs a change in medication even though she would acknowledges that her recent depression has been connected with the withdrawal of the emotional support from her son who has become wearied her emotional neediness. Reduce the imipramine to one of the 50 mg capsules for 1 week and then stop it Start Viibryd 20 mg tablet , start 1/2 tablet for 1 week, then 1 daily with food. Reduce duloxetine to 30 mg daily for 2 weeks then stop it.  Will attempt to stop this.  Rec antipsychotic.  She's scared of antipsychotics despite at this point poor prognosis for response to antidepressant alone.  Afraid of weight gain.  Rec trial of Lybalvi DT weight gain with olanzapine. 5 mg samples and she will come pick them up. Described rumination to her in detail that this was something that was difficult for her to understand.  It is clearly evident in her case.  This particular symptom increases the need for an antipsychotic.  It is unlikely that an antidepressant alone will address the symptom or her depression in general  Nuedexta markedly helpful for crying until recent visit it crying worsened again.  conitnue it. Likely to get worse if stopped.  Continue gabapentin 800 BID if she can remember  for TR anxiety.    She is not using the lorazepam excessively.  She is taking it appropriately.  The diazepam is prescribed vaginally by her gynecologist for chronic pelvic pain and not being used. We discussed the short-term risks associated with benzodiazepines including sedation and increased fall risk among others.  Discussed long-term side effect risk including dependence, potential withdrawal symptoms, and the potential eventual dose-related risk of dementia.  Disc the risk of Ambien amnesia.  Option busipirone.  This is an inadequately week medication to manage the severity of her symptoms.  Topiramate  100 mg BID.   To help weight and anxiety off label.  It was helping with weight loss and she's tolerating it.  Eating more on imipramine.  It also is used for HA.   Naltrexone or metformin or Ozempic  are other options.  Treated for OSA by Dr. Baird Lyons.  Stopped counseling but agrees to restart counseling.  May need PCP but would have to drive to [facility] for it and it's not really availab.e Also could really benefit from support group.  Disc this at length.  If retires disc the importance of picking good Medicare D plan that will cover the Nudexta in case it's needed. Also needs to continue social contact.  This appt was 30 mins.  FU 4 weeks, make 2 appts DT chronic unstable and desperate  Lynder Parents, MD, DFAPA  Please see After Visit Summary for patient specific instructions.    Future Appointments  Date Time Provider [place]  09/27/2021  4:00 PM Cottle, Billey Co., MD [facility] None    No orders of the defined types were placed in this encounter.     -------------------------------

## 2021-07-19 NOTE — Patient Instructions (Addendum)
Lybalvi 5 mg come pick up samples take at 5 PM  Reduce the imipramine to one of the 50 mg capsules for 1 week and then stop it Start Viibryd 20 mg tablet , start tomorrow 1/2 tablet for 1 week, then 1 daily with food. Reduce duloxetine to 30 mg daily for 2 weeks then stop it.

## 2021-07-20 ENCOUNTER — Other Ambulatory Visit (HOSPITAL_COMMUNITY): Payer: Self-pay

## 2021-07-21 DIAGNOSIS — H401134 Primary open-angle glaucoma, bilateral, indeterminate stage: Secondary | ICD-10-CM | POA: Diagnosis not present

## 2021-07-21 DIAGNOSIS — H25813 Combined forms of age-related cataract, bilateral: Secondary | ICD-10-CM | POA: Diagnosis not present

## 2021-08-10 ENCOUNTER — Other Ambulatory Visit: Payer: Self-pay | Admitting: Psychiatry

## 2021-08-10 ENCOUNTER — Other Ambulatory Visit (INDEPENDENT_AMBULATORY_CARE_PROVIDER_SITE_OTHER): Payer: Self-pay | Admitting: Internal Medicine

## 2021-08-10 ENCOUNTER — Other Ambulatory Visit (HOSPITAL_COMMUNITY): Payer: Self-pay

## 2021-08-10 DIAGNOSIS — F411 Generalized anxiety disorder: Secondary | ICD-10-CM

## 2021-08-10 DIAGNOSIS — E559 Vitamin D deficiency, unspecified: Secondary | ICD-10-CM

## 2021-08-10 DIAGNOSIS — F4001 Agoraphobia with panic disorder: Secondary | ICD-10-CM

## 2021-08-10 DIAGNOSIS — J302 Other seasonal allergic rhinitis: Secondary | ICD-10-CM

## 2021-08-10 DIAGNOSIS — J4540 Moderate persistent asthma, uncomplicated: Secondary | ICD-10-CM

## 2021-08-10 DIAGNOSIS — I10 Essential (primary) hypertension: Secondary | ICD-10-CM

## 2021-08-11 ENCOUNTER — Other Ambulatory Visit (HOSPITAL_COMMUNITY): Payer: Self-pay

## 2021-08-11 MED ORDER — TOPIRAMATE 100 MG PO TABS
ORAL_TABLET | Freq: Two times a day (BID) | ORAL | 0 refills | Status: DC
  Filled 2021-08-11: qty 180, 90d supply, fill #0

## 2021-08-13 ENCOUNTER — Other Ambulatory Visit: Payer: Self-pay

## 2021-08-13 ENCOUNTER — Other Ambulatory Visit (HOSPITAL_COMMUNITY): Payer: Self-pay

## 2021-08-13 DIAGNOSIS — F4001 Agoraphobia with panic disorder: Secondary | ICD-10-CM

## 2021-08-13 DIAGNOSIS — F411 Generalized anxiety disorder: Secondary | ICD-10-CM

## 2021-08-13 MED ORDER — TOPIRAMATE 100 MG PO TABS
ORAL_TABLET | Freq: Two times a day (BID) | ORAL | 0 refills | Status: DC
  Filled 2021-08-13: qty 180, fill #0
  Filled 2021-12-09: qty 180, 90d supply, fill #0

## 2021-08-16 ENCOUNTER — Other Ambulatory Visit: Payer: Self-pay | Admitting: Psychiatry

## 2021-08-17 ENCOUNTER — Other Ambulatory Visit (HOSPITAL_COMMUNITY): Payer: Self-pay

## 2021-08-17 MED ORDER — ZOLPIDEM TARTRATE 10 MG PO TABS
ORAL_TABLET | ORAL | 1 refills | Status: DC
  Filled 2021-08-17: qty 30, 30d supply, fill #0
  Filled 2021-09-14: qty 30, 30d supply, fill #1

## 2021-08-18 ENCOUNTER — Other Ambulatory Visit (HOSPITAL_COMMUNITY): Payer: Self-pay

## 2021-08-19 ENCOUNTER — Other Ambulatory Visit (HOSPITAL_COMMUNITY): Payer: Self-pay

## 2021-08-20 ENCOUNTER — Other Ambulatory Visit (HOSPITAL_COMMUNITY): Payer: Self-pay

## 2021-08-23 ENCOUNTER — Other Ambulatory Visit (HOSPITAL_COMMUNITY): Payer: Self-pay

## 2021-08-24 DIAGNOSIS — F418 Other specified anxiety disorders: Secondary | ICD-10-CM | POA: Diagnosis not present

## 2021-08-24 DIAGNOSIS — E782 Mixed hyperlipidemia: Secondary | ICD-10-CM | POA: Diagnosis not present

## 2021-08-24 DIAGNOSIS — I10 Essential (primary) hypertension: Secondary | ICD-10-CM | POA: Diagnosis not present

## 2021-08-24 DIAGNOSIS — G43909 Migraine, unspecified, not intractable, without status migrainosus: Secondary | ICD-10-CM | POA: Diagnosis not present

## 2021-08-24 DIAGNOSIS — E039 Hypothyroidism, unspecified: Secondary | ICD-10-CM | POA: Diagnosis not present

## 2021-08-26 ENCOUNTER — Telehealth: Payer: Self-pay | Admitting: Psychiatry

## 2021-08-26 NOTE — Telephone Encounter (Signed)
Phone call noted.

## 2021-08-26 NOTE — Telephone Encounter (Signed)
RTC  Anxiety worse after change Viibryd 20  and off imipramine.  She came off duloxetine but felt bad and took a few for a several days. Did not take Lybalvi.  She looked up ruminating and didn't think that was what she was doing.  Increase Viibryd to 40 mg daily. Keep option of Lybalvi bc it can help TRD and anxiety based on the known benefit of olanzapine for the above. Asks for something for crying.  Nothing else to offer except antipsychotic and sh's not read to try it yet.  She agrees to increase Viibryd to 40 mg daily.

## 2021-08-26 NOTE — Telephone Encounter (Signed)
Next visit is 09/27/21. [name] [name] called in extreme anxiety with the Vybrid she has been on for six weeks. She states she is crying all the time. She would like to speak to someone regarding this. Her phone number is [phone].

## 2021-08-31 ENCOUNTER — Telehealth: Payer: Self-pay | Admitting: Psychiatry

## 2021-08-31 NOTE — Telephone Encounter (Signed)
Pt called reporting  she's having severe left belly pain and diarrhea from Viibryd. Had to stay out of work today. Contact [phone]

## 2021-08-31 NOTE — Telephone Encounter (Signed)
She has been taking Viibryd for many weeks.  Has she been having diarrhea the whole time?  If it only recently started then it is not the Viibryd.  Make sure she is taking the Viibryd with food.  If the severe diarrhea is new that is recent in onset and therefore not related to Viibryd then she needs to either go to the ER or contact her primary care doctor for evaluation.  If she has had some diarrhea all along with the Viibryd she can take Imodium as needed to address the diarrhea.  She is very med sensitive failed multiple medications and I do not want to make psychiatric medication changes between her appointments.

## 2021-08-31 NOTE — Telephone Encounter (Signed)
As noted Viibryd will not cause sudden severe diarrhea after weeks of taking it.  See PCP if it continues.  Re: glaucoma, that warning is a "class warning" on antidepressants that primarily aplies to tricyclic antidepressants.  Little to know risk that Viibryd would cause it bc it lacks anticholinergic SE which are the cause.  See eye doctor to check pressure if she's worried about it.  I cannot call her DT evening meetings tonight and tomorrow.

## 2021-08-31 NOTE — Telephone Encounter (Signed)
Pt stated she had made you aware of the belly pain once before.She said the diarrhea is not good and she contacted the viibryd manufacturer herself and they recommended she visit the ER.She did not want to do this without speaking to you first.She stated her Covid test was also negative

## 2021-08-31 NOTE — Telephone Encounter (Signed)
Pt stated she has been having loose stools for several days but today it's terrible.She has gone at least 10 times.She stated she always takes it with food.She will take imodium today but she wants you to call her and discuss this.She also wanted me to me to mention that the viibryd nurse told her the med can cause increased glaucoma pressure.

## 2021-09-01 ENCOUNTER — Other Ambulatory Visit (HOSPITAL_COMMUNITY): Payer: Self-pay

## 2021-09-01 DIAGNOSIS — R109 Unspecified abdominal pain: Secondary | ICD-10-CM | POA: Diagnosis not present

## 2021-09-01 DIAGNOSIS — R197 Diarrhea, unspecified: Secondary | ICD-10-CM | POA: Diagnosis not present

## 2021-09-01 MED ORDER — NP THYROID 90 MG PO TABS
90.0000 mg | ORAL_TABLET | Freq: Every day | ORAL | 2 refills | Status: AC
  Filled 2021-09-01: qty 90, 90d supply, fill #0
  Filled 2021-12-25: qty 90, 90d supply, fill #1

## 2021-09-01 NOTE — Telephone Encounter (Signed)
She has failed to respond to or not tolerated 13 different psychiatric medications.  She needs to be certain that the Viibryd is actually causing the diarrhea before she dismisses it as an option because the next option category is in the antipsychotic category and she is afraid of those medicines.  If she insists on coming off of Viibryd cut the dosage in half for 1 week and stop it.  I will not start another antidepressant or consider another antidepressant until her appointment.  The only option I would consider is the suggestion at the last 2 appts of Lybalvi 5 mg and she can pick up samples.  If she refuses then we will wait until her scheduled appt or I can refer her for ECT at [facility] or [facility] or [facility]

## 2021-09-01 NOTE — Telephone Encounter (Signed)
Rtc to pt and she reports the NP at urgent care diagnosed her with gastritis and to add Pepcid to her Protonix daily. She also got a prescription for Bentyl. She was tender on both sides and the NP discussed stress and her Viibryd causing issues as reported by [employer]. Informed her I would update Dr. Clovis Pu and check on further recommendations.

## 2021-09-01 NOTE — Telephone Encounter (Signed)
Pt reports she is in Urgent Care to be seen for her stomach pain, diarrhea. She reports it's coming from the [facility]. She asked me to talk to Dr. Clovis Pu about what to do. Informed her I would call her back in 1 hour to follow up. 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 Asked about any pain when urinating or anything off. Reports she only drinks water but hasn't noticed anything.   Will follow up.

## 2021-09-03 ENCOUNTER — Telehealth: Payer: Self-pay | Admitting: Psychiatry

## 2021-09-03 ENCOUNTER — Other Ambulatory Visit (HOSPITAL_COMMUNITY): Payer: Self-pay

## 2021-09-03 NOTE — Telephone Encounter (Signed)
Rtc to pt and she reports her stomach issues are not improving. She is unable to get in with Endoscopy until October. She is asking if she tapers off of Viibryd what would she take? I informed her nothing could be started until she tapers off Viibryd and make sure her side effects improve. She became very upset about that and says she can't go without an antidepressant. I asked her if she has any options or have her and Dr. Clovis Pu discussed options, she reports no. We discussed when her symptoms began with her stomach, she reports they worsened when she increased her dose. Advised her to decrease her dose down to Viibryd 20 mg take 1-1.5 tablets over the weekend, call on Tuesday to see if there are any cancellations and give update with information. She agreed.

## 2021-09-03 NOTE — Telephone Encounter (Signed)
Marjie called to follow up on her call from 9/21.  She really needs to hear from you today.  She is very concerned.

## 2021-09-03 NOTE — Telephone Encounter (Signed)
Noted see previous phone message

## 2021-09-08 ENCOUNTER — Other Ambulatory Visit (HOSPITAL_COMMUNITY): Payer: Self-pay

## 2021-09-08 ENCOUNTER — Other Ambulatory Visit: Payer: Self-pay | Admitting: Psychiatry

## 2021-09-08 DIAGNOSIS — F332 Major depressive disorder, recurrent severe without psychotic features: Secondary | ICD-10-CM

## 2021-09-08 MED ORDER — VILAZODONE HCL 20 MG PO TABS
30.0000 mg | ORAL_TABLET | Freq: Every day | ORAL | 0 refills | Status: DC
Start: 2021-09-08 — End: 2021-09-27
  Filled 2021-09-08 – 2021-09-09 (×2): qty 30, 20d supply, fill #0

## 2021-09-08 NOTE — Telephone Encounter (Signed)
Please see message.I called Pt and she stated she is taking 1.5 tabs daily

## 2021-09-08 NOTE — Telephone Encounter (Signed)
Patient called in for refill on Viibryd 20mg . States that she needs it ASAP because she only enough for today and tomorrow. She would like this to be filled today due to pharmacy, if they receive it today she will be able to get today otherwise she won't be able to receive until the weekend. Ph: [phone]. Appt 10/17. Pharmacy [facility] [place], [place].

## 2021-09-09 ENCOUNTER — Other Ambulatory Visit (HOSPITAL_COMMUNITY): Payer: Self-pay

## 2021-09-09 ENCOUNTER — Other Ambulatory Visit (INDEPENDENT_AMBULATORY_CARE_PROVIDER_SITE_OTHER): Payer: Self-pay

## 2021-09-09 MED ORDER — PANTOPRAZOLE SODIUM 40 MG PO TBEC
DELAYED_RELEASE_TABLET | ORAL | 1 refills | Status: DC
  Filled 2021-09-09: qty 180, 90d supply, fill #0

## 2021-09-13 ENCOUNTER — Telehealth: Payer: Self-pay

## 2021-09-13 ENCOUNTER — Other Ambulatory Visit (HOSPITAL_COMMUNITY): Payer: Self-pay

## 2021-09-13 NOTE — Telephone Encounter (Signed)
Prior Authorization submitted and approved for NUEDEXTA 20-10 MG #60 effective 09/13/2021-09/12/2022 with [employer].  [facility] pharmacy notified.

## 2021-09-14 ENCOUNTER — Other Ambulatory Visit (HOSPITAL_COMMUNITY): Payer: Self-pay

## 2021-09-15 ENCOUNTER — Other Ambulatory Visit (HOSPITAL_COMMUNITY): Payer: Self-pay

## 2021-09-20 ENCOUNTER — Other Ambulatory Visit (INDEPENDENT_AMBULATORY_CARE_PROVIDER_SITE_OTHER): Payer: Self-pay

## 2021-09-21 ENCOUNTER — Other Ambulatory Visit (INDEPENDENT_AMBULATORY_CARE_PROVIDER_SITE_OTHER): Payer: Self-pay | Admitting: Nurse Practitioner

## 2021-09-21 ENCOUNTER — Other Ambulatory Visit (HOSPITAL_COMMUNITY): Payer: Self-pay

## 2021-09-21 ENCOUNTER — Other Ambulatory Visit (INDEPENDENT_AMBULATORY_CARE_PROVIDER_SITE_OTHER): Payer: Self-pay | Admitting: Psychiatry

## 2021-09-21 DIAGNOSIS — I10 Essential (primary) hypertension: Secondary | ICD-10-CM

## 2021-09-21 DIAGNOSIS — E559 Vitamin D deficiency, unspecified: Secondary | ICD-10-CM

## 2021-09-21 DIAGNOSIS — J302 Other seasonal allergic rhinitis: Secondary | ICD-10-CM

## 2021-09-21 DIAGNOSIS — J4540 Moderate persistent asthma, uncomplicated: Secondary | ICD-10-CM

## 2021-09-23 ENCOUNTER — Other Ambulatory Visit (HOSPITAL_COMMUNITY): Payer: Self-pay

## 2021-09-23 MED ORDER — HYDROCHLOROTHIAZIDE 12.5 MG PO TABS
ORAL_TABLET | ORAL | 6 refills | Status: AC
  Filled 2021-09-23: qty 30, 30d supply, fill #0
  Filled 2021-10-25: qty 90, 90d supply, fill #1
  Filled 2022-01-30: qty 90, 90d supply, fill #2

## 2021-09-23 MED ORDER — AMLODIPINE BESYLATE 5 MG PO TABS
ORAL_TABLET | ORAL | 6 refills | Status: AC
  Filled 2021-09-23: qty 30, 30d supply, fill #0
  Filled 2021-11-01: qty 90, 90d supply, fill #1
  Filled 2022-01-28: qty 90, 90d supply, fill #2

## 2021-09-23 MED ORDER — MONTELUKAST SODIUM 10 MG PO TABS
ORAL_TABLET | ORAL | 6 refills | Status: AC
  Filled 2021-09-23: qty 30, 30d supply, fill #0
  Filled 2021-11-01: qty 90, 90d supply, fill #1
  Filled 2022-01-30: qty 90, 90d supply, fill #2

## 2021-09-25 ENCOUNTER — Other Ambulatory Visit (INDEPENDENT_AMBULATORY_CARE_PROVIDER_SITE_OTHER): Payer: Self-pay | Admitting: Internal Medicine

## 2021-09-25 ENCOUNTER — Other Ambulatory Visit (HOSPITAL_COMMUNITY): Payer: Self-pay

## 2021-09-25 ENCOUNTER — Other Ambulatory Visit (INDEPENDENT_AMBULATORY_CARE_PROVIDER_SITE_OTHER): Payer: Self-pay

## 2021-09-25 DIAGNOSIS — I10 Essential (primary) hypertension: Secondary | ICD-10-CM

## 2021-09-25 DIAGNOSIS — J302 Other seasonal allergic rhinitis: Secondary | ICD-10-CM

## 2021-09-25 DIAGNOSIS — E559 Vitamin D deficiency, unspecified: Secondary | ICD-10-CM

## 2021-09-25 DIAGNOSIS — J3089 Other allergic rhinitis: Secondary | ICD-10-CM

## 2021-09-25 DIAGNOSIS — J4540 Moderate persistent asthma, uncomplicated: Secondary | ICD-10-CM

## 2021-09-27 ENCOUNTER — Ambulatory Visit (INDEPENDENT_AMBULATORY_CARE_PROVIDER_SITE_OTHER): Payer: 59 | Admitting: Psychiatry

## 2021-09-27 ENCOUNTER — Other Ambulatory Visit: Payer: Self-pay

## 2021-09-27 ENCOUNTER — Other Ambulatory Visit (INDEPENDENT_AMBULATORY_CARE_PROVIDER_SITE_OTHER): Payer: Self-pay

## 2021-09-27 ENCOUNTER — Other Ambulatory Visit (HOSPITAL_COMMUNITY): Payer: Self-pay

## 2021-09-27 ENCOUNTER — Encounter: Payer: Self-pay | Admitting: Psychiatry

## 2021-09-27 DIAGNOSIS — F332 Major depressive disorder, recurrent severe without psychotic features: Secondary | ICD-10-CM

## 2021-09-27 DIAGNOSIS — F4001 Agoraphobia with panic disorder: Secondary | ICD-10-CM | POA: Diagnosis not present

## 2021-09-27 DIAGNOSIS — F5105 Insomnia due to other mental disorder: Secondary | ICD-10-CM

## 2021-09-27 DIAGNOSIS — F4321 Adjustment disorder with depressed mood: Secondary | ICD-10-CM | POA: Diagnosis not present

## 2021-09-27 DIAGNOSIS — G4733 Obstructive sleep apnea (adult) (pediatric): Secondary | ICD-10-CM

## 2021-09-27 DIAGNOSIS — F482 Pseudobulbar affect: Secondary | ICD-10-CM

## 2021-09-27 DIAGNOSIS — F411 Generalized anxiety disorder: Secondary | ICD-10-CM | POA: Diagnosis not present

## 2021-09-27 MED ORDER — VILAZODONE HCL 40 MG PO TABS
40.0000 mg | ORAL_TABLET | Freq: Every day | ORAL | 1 refills | Status: DC
  Filled 2021-09-27: qty 30, 30d supply, fill #0
  Filled 2021-10-25: qty 30, 30d supply, fill #1

## 2021-09-27 MED ORDER — NEBIVOLOL HCL 10 MG PO TABS
10.0000 mg | ORAL_TABLET | Freq: Two times a day (BID) | ORAL | 0 refills | Status: DC
  Filled 2021-09-27: qty 180, 90d supply, fill #0

## 2021-09-27 MED ORDER — ARIPIPRAZOLE 10 MG PO TABS
10.0000 mg | ORAL_TABLET | Freq: Every day | ORAL | 1 refills | Status: DC
  Filled 2021-09-27: qty 30, 30d supply, fill #0

## 2021-09-27 NOTE — Progress Notes (Signed)
GWENDLYON ZUMBRO [id number] [birth year] 65 y.o.    Subjective:   Patient ID:  [name] [name] is a 65 y.o. (DOB 06/20/1956) female.  Chief Complaint:  Chief Complaint  Patient presents with   Follow-up   Depression   Anxiety   Medication Problem    Depression        Associated symptoms include myalgias and headaches.  Associated symptoms include no decreased concentration, no fatigue and no suicidal ideas. Medication Refill Associated symptoms include abdominal pain, headaches and myalgias. Pertinent negatives include no chest pain, fatigue or weakness.  [name] [name] presents to the office today for follow-up of anxiety.  seen April 2020.  She was dealing with grief and depression and sertraline was increased back to 200 mg daily.  [name] [name] died in 10-17-18  from cancer.  Married 41 years.  Is able to focus on positive memories.  Not ready to move.  seen August 2020.  No meds were changed.  She continued sertraline 200 mg daily.  seen January 2020.  Sertraline was unchanged.  She was having still grief issues related to the loss of her husband.  She was also having problems with weight attributed to the sertraline.  She was given a trial of topiramate to try to help offset some of the weight gain plus it can have some potential antianxiety effects.  seen 02/19/20 stating: Very depressed. So sad.  Don't want to get OOB.  Got lost coming here and late.  Worse for 3-4 weeks.  Days off are terrible.  Barely able to function at work.  Cry all the way home after work.  So lonely.  Kids aren't coming like they were bc they are busy.  Need to figure out her own life.  Life was centered around H and kids.  Denies suicidal thoughts  Made following med changes: Increase sertraline back to 300 mg daily.  She's aware it's higher than the expected usual dose. Rexulti 2 mg daily Topiramate increase to target 50 mg BID and possibly higher for obesity.  03/18/2020 appointment the  following is noted: No meds were changed. She didn't increase sertraline bc fear of weight gain.  Quite a bit different.  No crying.  Sad a few times.  Better in 5 days.  Feels much less depressed.  Better energy, interest, enjoyment.  Also weather helping and started planting.  More motivation.  Desperate to lose weight.  Has some chronic pain and taking gabapentin.   Started gym cycle and lost some weight.   Worried over D with depression and conflict with pt.  D was always closer to father than her.  D distanced from her and recent bad outburst with her.  D recently blocked communication with her.   Disc getting her help.  05/19/2020 appointment with the following noted: Tried to self medicate bc felt good on Rexulti and tried reducing sertraline to 150 for 2 weeks and got worse.  So increased back to 100 mg BID. Stopped cable and got too bored for 3 mos.  Restarted cable yesterday.   Still grieving.  Kids want her to sell the house but she can't. Anxiety in the morning fidgety.  Taken more Ativan lately with more panic and worry over the future.  Almost like too much caffeine before any.  Limits it. Better if busy. Does better with working.  Was too sleepy with progesterone. Successful losing weight on the topiramate. Assessment/Plan: Her panic disorder is generally controlled but more anxious  somewhat..She is still dealing with a lot of grief with waves of depression and anxiety and at times easily overwhelmed but functional.   trial Rexulti 3 mg daily for 2 weeks to see if can gain more benefit for depression and anxiety. If no benefit then reduce Rexulti back to 2 mg.  She'll call  06/01/2020 phone call reporting that 3 mg Rexulti seem to be helping and she wanted to continue it. 06/03/2020, 2 days later she called back stating it was making her too sleepy.  She was planning a trip and she was encouraged to stop the medicine until she returns from her trip and we would reevaluate. 06/11/2020 phone  call stating after stopping the Rexulti she was said, depressed and crying all the time and wanted to return to Rexulti at 1 mg daily to which that was agreed. 07/02/2020 she called back wanting to milligram tablets of Rexulti because she had increased it on her own to that dosage and felt better.  She also had stopped drinking wine in the evening which she felt like helped.  So at her request a prescription was submitted for 2 mg Rexulti to the pharmacy. 07/16/2020 phone call stating she was very lonely and wanted to go back up to 3 mg of Rexulti.  That request was refused because of her previous side effects and making frequent changes with a med with a very long half-life like Rexulti is not a good idea.  07/21/2020 appointment with the following noted: Pretty desperate to do something. She increased Rexulti AMA to 3 mg despite being asked not to do so. Still sleepy and anxious.  Stopped wine for several weeks Ativan 1 -2 mg daily at work bc very anxious. Yesterday when son was there she wasn't sleepy or depressed until he left. Denies drowsiness when driving. Feels no better with grief. Plan: Stop Rexulti DT sleepiness.   Start Latuda 20 mg daily but she refuses bc doesn't think she can eat enough with it.  Then says she'll try it.  Topiramate She would like to increase further; OK increase to 75 mg BID.  No therapy since husband died. Needs counseling desperately.  She commits.  09/22/2020 appointment with the following noted Multiple phone calls since the last visit in crisis. Tried Latuda up to 40 mg a day with 3 mg Ativan daily.  She felt like it made her worse and stopped it. Recommended haloperidol 2 mg tablets 1-2 nightly due to extreme unmanageable anxiety and fearfulness.   08/18/2020 telephone call with the following noted:Tried haloperidol 1 mg and didn't help anything.  No effect except a little sleepiness.  Took it sparingly bc afraid of it.  Afraid of tremors and TD.  I didn't to  give it a good chance.  Strongly rec ECT.  Currently will consider and agrees to go to [facility] for consult.  I'm scared.  Scared of meds too.  Currently on sertraline 200 mg daily.  Plan: Reduce sertraline to 150 daily and start duloxetine 30 mg capsule daily for 5 days, Then reduce sertraline to 100 mg and increase duloxetine to 2 capsules daily for 5 days, Then reduce sertraline to 1/2 of 100 mg aily and increase duloxetine to 90 mg daily for 5 days then stop sertraline and continue duloxetine 90 mg daily. Disc SE.  09/09/2020 extended phone call in crisis with nurse.  She was still transitioning from sertraline to duloxetine and trazodone was added for sleep. She's refusing ECT consult with [facility]  and didn't go. She has found a new therapist and seen her several times and she's local.  She is hopeful it might help. On duloxetine 90 mg since about 09/02/20. Never took buspirone but did receive it. Takes Ativan. Taking topiramate 75 mg in AM and on 50 mg PM.  Never increased to 75 mg BID. Takes Ambien initially and trazodone 25 mg HS and sleep not as good with duloxetine vs Zoloft.  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  Before that was sobbing and often not staying with herself. FMLA filled out for accomodation for leave if crying spells are unmanageable. Used to call H when she got in the car and son offered to have her call but he's less available.  So often cries in the car but onlly one bad crying spell after Nudexta. Hx migraine tx by neurologist who said MRI showed spots suggesting head injury.  Pt never remembers history of head injury. 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 Tomorrow is her and H's birthday and having family party tonight. Anniversary of 2 years is 10/25 and doing better about staying alone in her house. Uncertain mood effect from change to duloxetine so far but thinks maybe she's a  little better. Doesn't tolerate being alone very well.  Worry about winter. Plan continue Nuedexta was helpful for crying spells though it did not resolve them Continue duloxetine 90 mg to give it more time to work as it is only been about 3 weeks Okay per her request to increase topiramate to 75 mg twice daily.  10/26/2020 appointment with the following noted: Takes Ativan in morning.  Drinks more caffeine at home than work.  Might cause some anxiety.  But anxious at work too over insecurity and takes Ativan in morning also.  Takes it to drive. Average 2-3 Ativan daily.  Less anxious if not alone. On duloxetine 90 for 7 weeks. Crying spells still better on Nudexta than not.  Better than last time. Duloxetine is helping with depression and anxiety both are better than last visit.  Dep 5/10.  Anxiety  6/10 bc of work and driving.  Friends notice the benefit also. Not a winter person and dreads it. Not much appetite in evening but eats good lunch at work and breakfast.  Topiramate has curbed her appetite.   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   Work stressful and hates driving including in the winter and doesn't think she can do it anymore.   Sleep about 5 hours and sometimes can't get back to sleep on trazodone 50 and Ambien and Ativan but doesn't think she can take more trazodone DT hangover.  No amnesia.  Plan: Nuedexta markedly helpful for crying but hasn't resolved it. Duloxetine 90 on 7 weeks with benefit..  Increase dulxetine to max to see if can get more benefit bc sig residual sx. 120 mg daily.  11/25/2020 appointment with the following noted: Hard time. Since here.  Started crying a lot again.  Has been consistent with Nudexta.  Felt good for 3-4 days then last night had neg interaction with daughter while in men's section of [employer].  Got triggered over seeing men's clothes and D got mad.  She was crying and got a disoriented and had trouble finding her way out.  She doesn't understand  her grief.  Crying for weeks and then briefly better and recurred. Plan to retire Feb but scared to stay by herself.  Work  is only socialization she has and it's place she can enjoy things.   Never stayed alone before. I can't stay by myself.  Not afraid but lonely.  No hobbies. Plan: Nuedexta markedly helpful for crying until the last visit it started up again. Reduce Duloxetine to  90 mg bc increase didn't help after 4 weeks and the crying may be worse related to NE. Risperidone 1 mg BID prn   12/28/2020 appointment with the following noted: Was mainly taking gabapentin for pain and it's better sor reduced gabapentin to 2 daily for months.  Forgets to take it at work. Anxiety all day long. Risperidone prn bad crying day only once and it helped.  No SE Crying a lot since here when she is alone at home or in car.  Does not do it at work. Painful cry.  Sad and lonely.  Grief cry.   More anxious driving than she used to be. Using Ativan 1 mg average 2 daily.  Plan: Nuedexta markedly helpful for crying until the last visit it crying worsened again. Continue Duloxetine  90 mg bc increase didn't help after 4 weeks and the crying may be worse related to NE. 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  If fails after a week reduce back to current dose and instead increase Risperidone 1 mg BID.   01/27/2021 appt with following noted: Never  Remembered to take gabapentin QID but could take BID.  No SE. Since here more sad than anxious.  No panic lately. Still crying spells.  Only took risperidone once.  Most of crying in AM and off work at home alone. Is losing weight with topiramate and would like to go higher. Can feel alright when with family. Plan: Reduce duloxetine to 60 mg daily. Start imipramine 1 of the 25 mg tablets nightly for 1 week,  then 2 nightly for 1 week,  then 3 at night for 1 week. Wait 1 week and get the blood test, preferably in the morning.  02/25/2021  appointment with the following noted: Still crying, horrible yesterday on the way home.  Hopeless.  Still doesn't like being alone.  Has a new counselor.  Was invited to church function tomorrow. SE cotton mouth.  awakens 2-3 times a night.  Negative thoughts about taking so much medicine. Kids and gkids in [place] and can't move away.   Angry over tracking devices.    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   Continue imipramine 75 Hs and duloxetine 60 for now and get blood test ASAP.  03/12/2021 telephone note" :tC to correct instructions on dosing of imipramine.  Her blood level of imipramine on imipramine 75 Hs and duloxetine 60 is 108.  The level needs to be about 200.  Therefore have her increase imipramine  to 100 mg for 5 nights, then increase to 150 mg.  When she needs it I will send in a prescription for imipramine 50 mg tablets 3 nightly which is double the current strength of her 25 mg tablets. She should continue the duloxetine 60 mg daily until we can achieve an adequate blood level of imipramine which this increase should accomplish in about 1 week after increasing to 150 mg daily.  She understood.  She'll call after on 150 mg for a week and we will repeat the lab.  [name] Parents, MD, [facility]  04/20/2021 appointment with the following noted: Got Covid in April.  Back to work 2 weeks.   Took her 2 weeks to recover. On imipramine 150 mg about 3-4 nights but had SE.  Had agitated sleep and moved things around while asleep on this dose.  She backed down to 100 mg  SE dry mouth is uncomfortable. A whole lot of improvement with depression and anxiety on the med overall.  Weather changes will affect her mood.  Still takes Ativan at work.  Driving still bothers her if in traffic. Still very lonely but has gotten out more. 2 different Bible studies good.   D 66 yo is pregnant with her first. Son sees the benefit from imipramine.  He's FT minister. Less crying.    Plan: Markedly better with imipramine 100 mg combined with duloxetine 60.  Disc DDI.   Repeat level  05/17/2021 appt noted: Didn't get blood level yet.   Emotionally doing OK. CO clenching teeth.  History of mouth guard made a good while ago and started using it again.   Started HA recently develops in the afternoon.  Doesn't have it when awakens.  Son noticed her clenching and grinding teeth.  Usually unaware of grinding teeth in the day.   Sleep is pretty good. Had a little more anxiety situationally.  Taken Ativan a little more. No full panic but some anxiety.   Went to [employer] where best friend is.  She noticed a big improvement.   SE dry mouth and bruxism. Doesn't like imipramine.   Missed one day of Nudexta and duloxetine and had a crying spell.  Otherwise those spells have been better. Plan: Markedly better with imipramine 100 mg combined with duloxetine 60.  Disc DDI.   Repeat level Then consider reducing duloxetine which might be contributing to the SE of bruxism. Stop trazodone and substitute cyclobenzaprine 10 mg HS for bruxism.  06/15/2021 appointment with following noted: Eye doc next week with extremely dry eyes from imipramine evidently.  FU next week. Doesn't want to stay on the medicine.  Disc her frustrations with the med even though she acknowledges the imipramine has helped her.  Residual depression and anxiety and chronic grief remained with fears about being alone.  She has been able to continue to work. Plan: Extensive discussion around the risk of stopping imipramine which is the only thing that has helped her recently.  Discussed alternatives. Reduce the imipramine to one of the 50 mg capsules for 1 week and then stop it Start Viibryd 20 mg tablet , start 1/2 tablet for 1 week, then 1 daily with food. Reduce duloxetine to 30 mg daily.  Will attempt to stop this.  DC cyclobenzaprine bc hangover  07/05/2021 phone call asking for refill of imipramine.  When questioned  about it she indicated she never stopped taking it despite what she indicated at the last visit.  07/19/2021 appointment with the following noted: Still on Cymbalta 60 mg daily, gabapentin 800 mg average twice daily bc forgets the rest, Lorazepam 1 mg average rarely. Topomax 100 mg BID, trazodone 50 mg HS, ambien 10 HS.   Not taken any Viibryd. Never stopped imipramine bc talked to son's who said she was so much better with the imipramine.  Talked to eye doctor which is better and maybe related to shingles.   Seeing dentist today with problems with teeth bc grinding her teeth. Crying all the time getting worse in the last few weeks.  Son's getting burned out on her neediness and she's not going to see him as  frequently and so he's less available which has made her more depressed and tearful. Had stopped therapy but plans to restart it bc needed.  I need to get out of the house.  Lonely. Has not checked on local support group lately. Depression is worse than last visit.  Didn't feel this level of depression last time but is experienced as extreme loneliness.  Neglecting house hold chores. Tries to talk on phone nightly with someone. Plan: Reduce the imipramine to one of the 50 mg capsules for 1 week and then stop it Start Viibryd 20 mg tablet , start 1/2 tablet for 1 week, then 1 daily with food. Reduce duloxetine to 30 mg daily for 2 weeks then stop it.  Will attempt to stop this. Rec antipsychotic.  She's scared of antipsychotics despite at this point poor prognosis for response to antidepressant alone.  Afraid of weight gain.  Rec trial of Lybalvi DT weight gain with olanzapine.  08/26/2021 phone call: Next visit is 09/27/21. Aliveah called in extreme anxiety with the Vybrid she has been on for six weeks. She states she is crying all the time. She would like to speak to someone regarding this. 08/26/2021 MD message:RTC  Anxiety worse after change Viibryd 20  and off imipramine.  She came off  duloxetine but felt bad and took a few for a several days. Did not take Lybalvi.  She looked up ruminating and didn't think that was what she was doing.  Plan: Increase Viibryd to 40 mg daily. Keep option of Lybalvi bc it can help TRD and anxiety based on the known benefit of olanzapine for the above. Asks for something for crying.  Nothing else to offer except antipsychotic and sh's not read to try it yet.  She agrees to increase Viibryd to 40 mg daily.  08/31/2021 phone call strings summarized as follows: Complaining of stomach pain and diarrhea which she attributed to [place]. MD response: She has failed to respond to or not tolerated 13 different psychiatric medications.  She needs to be certain that the Viibryd is actually causing the diarrhea before she dismisses it as an option because the next option category is in the antipsychotic category and she is afraid of those medicines.  If she insists on coming off of Viibryd cut the dosage in half for 1 week and stop it. I will not start another antidepressant or consider another antidepressant until her appointment.  The only option I would consider is the suggestion at the last 2 appts of Lybalvi 5 mg and she can pick up samples.  If she refuses then we will wait until her scheduled appt or I can refer her for ECT at [facility] or [facility] or [facility]  09/27/21 appt noted: Had bought of severe gastritis and has appt with GI Thursday.  Eating more with med helped. Refused Lybalvi. Still feels bad.  Seasonal depression with anniversary of H's death upcoming.   Equally bad when last here and now without noticeable change with Viibryd.  Dryness better off imipramine.  Has ongoing fears driving and maybe that is worse.   CO EMA and worse than last time. Difficulty enjoying things. Cont rumination on H's death chronically.  Past Psychiatric Medication Trials:  Zoloft 300, Lexapro, paroxetine 80 in 2017, Duloxetine 120 Imipramine 150 SE did have some  benefit Nudexta helped crying for awhile until increase duloxetine and holidays Paliperidone, olanzapine, aripiprazole 20 mg, Rexulti 3 mg sleepy Latuda buspirone,  clonidine, topiramate, gabapentin, metformin,  phentermine,  trazodone, Ambien, Ativan,  Remote history topiramate for migraine 1998, [place] helped lose 85#  Pt started [facility] 2016  Review of Systems:  Review of Systems  Constitutional:  Positive for activity change. Negative for fatigue and unexpected weight change.       Sweating  HENT:         Dry mouth  Eyes:  Positive for visual disturbance. Negative for pain.  Respiratory:  Negative for wheezing.   Cardiovascular:  Negative for chest pain and palpitations.  Gastrointestinal:  Positive for abdominal pain and diarrhea. Negative for rectal pain.  Musculoskeletal:  Positive for myalgias.  Neurological:  Positive for headaches. Negative for dizziness, tremors and weakness.  Psychiatric/Behavioral:  Positive for behavioral problems and dysphoric mood. Negative for agitation, confusion, decreased concentration, hallucinations, self-injury, sleep disturbance and suicidal ideas. The patient is nervous/anxious. The patient is not hyperactive.    Medications: I have reviewed the patient's current medications.  Current Outpatient Medications  Medication Sig Dispense Refill   acetaminophen (TYLENOL) 500 MG tablet Take by mouth.     albuterol (PROAIR HFA) 108 (90 Base) MCG/ACT inhaler Inhale 2 puffs into the lungs every 6 (six) hours as needed for wheezing or shortness of breath. Use as needed only  if your can't catch your breath (Patient taking differently: Inhale 2 puffs into the lungs every 6 (six) hours as needed for wheezing or shortness of breath. Use as needed only  if your can't catch your breath) 18 g 3   amLODipine (NORVASC) 5 MG tablet Take 1 tablet by mouth daily 30 tablet 6   B Complex Vitamins (B-COMPLEX/B-12) LIQD Place 5 mLs under the tongue daily at 2 PM.      Cholecalciferol (VITAMIN D) 125 MCG (5000 UT) CAPS Take 5,000 Units by mouth daily.     Cholecalciferol 50 MCG (2000 UT) TABS Take by mouth.     desonide (DESOWEN) 0.05 % cream APPLY TO THE AFFECTED AREA(S) ON FACE UP TO TWO TIMES DAILY 30 g 3   Dextromethorphan-quiNIDine (NUEDEXTA) 20-10 MG capsule TAKE 1 CAPSULE BY MOUTH TWICE A DAY 60 capsule 2   fexofenadine (ALLEGRA) 180 MG tablet Take 180 mg by mouth daily.     gabapentin (NEURONTIN) 800 MG tablet Take 1 tablet (800 mg total) by mouth 4 (four) times daily. 120 tablet 1   hydrochlorothiazide (HYDRODIURIL) 12.5 MG tablet Take 1 tablet by mouth once a day 30 tablet 6   ipratropium-albuterol (DUONEB) 0.5-2.5 (3) MG/3ML SOLN SMARTSIG:3 Milliliter(s) Via Nebulizer 3 Times Daily     LORazepam (ATIVAN) 1 MG tablet TAKE 1 TABLET BY MOUTH EVERY 6 HOURS AS NEEDED 60 tablet 1   metroNIDAZOLE (METROCREAM) 0.75 % cream APPLY TO FACE TWO TIMES DAILY 45 g 0   mometasone (NASONEX) 50 MCG/ACT nasal spray PLACE 2 SPRAYS INTO EACH NOSTRIL DAILY. 17 g 3   montelukast (SINGULAIR) 10 MG tablet Take 1 tablet by mouth once a day 30 tablet 6   nebivolol (BYSTOLIC) 10 MG tablet Take 1 tablet (10 mg total) by mouth 2 (two) times daily. 180 tablet 0   nebivolol (BYSTOLIC) 10 MG tablet TAKE 1 TABLET BY MOUTH 2 TIMES DAILY 180 tablet 0   neomycin-polymyxin b-dexamethasone (MAXITROL) 3.5-10000-0.1 OINT Place 1 application into the right eye at bedtime.     NP THYROID 120 MG tablet Take 1 tablet (120 mg total) by mouth daily before breakfast. 30 tablet 3   pantoprazole (PROTONIX) 40 MG tablet TAKE 1 TABLET BY MOUTH 2 TIMES DAILY  60 tablet 2   pantoprazole (PROTONIX) 40 MG tablet Take 1 tablet by mouth twice daily 180 tablet 1   thyroid (NP THYROID) 90 MG tablet Take 1 tablet (90 mg total) by mouth daily. 90 tablet 2   timolol (TIMOPTIC) 0.5 % ophthalmic solution Instill 1 drop in right eye twice a day 5 mL 3   topiramate (TOPAMAX) 100 MG tablet TAKE 1 TABLET BY MOUTH 2  TIMES DAILY 180 tablet 0   Travoprost, BAK Free, (TRAVATAN) 0.004 % SOLN ophthalmic solution Place 1 drop into both eyes at bedtime.     traZODone (DESYREL) 50 MG tablet Take 1 tablet (50 mg total) by mouth at bedtime. 90 tablet 1   Vilazodone HCl (VIIBRYD) 20 MG TABS Take 1.5 tablets (30 mg total) by mouth daily. (Patient taking differently: Take 30 mg by mouth daily. 40/30 mg every other day) 30 tablet 0   zolpidem (AMBIEN) 10 MG tablet TAKE 1/2 to 1 TABLET BY MOUTH AT BEDTIME AS NEEDED 30 tablet 1   No current facility-administered medications for this visit.    Medication Side Effects: Other: ? sweating related.  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 - no current med.   Glucose intolerance (impaired glucose tolerance)    on Metformin   H/O hiatal hernia    "sliding"   History of echocardiogram    Echo 1/16: EF 60-65, no RWMA, Gr 1 DD, mild LAE, trivial pericardial eff post to heart   History of echocardiogram    Echo 11/17: EF 55-60, no RWMA, Gr 1 DD, normal RV function.   History of palpitations    Holter 12/15: NSR, PACs   Hx of migraines    Hypothyroidism    Osteoarthritis    knees   Positional headache since 1997   if lies on left side    Family History  Problem Relation Age of Onset   Lung cancer Father 33       dies age 22   Emphysema Father    COPD Brother 47       died age 37   Stroke Mother    Hypertension Sister    Thyroid disease Sister        Graves Disease   Breast cancer Neg  Hx     Social History   Socioeconomic History   Marital status: Widowed    Spouse name: Not on file   Number of children: Not on file   Years of education: Not on file   Highest education level: Not on file  Occupational History   Occupation: Land    Employer: [employer]  Tobacco Use   Smoking status: Never   Smokeless tobacco: Never  Vaping Use   Vaping Use: Never used  Substance and Sexual Activity   Alcohol use: Yes    Comment: occ   Drug use: No   Sexual activity:  Yes    Partners: Male    Birth control/protection: Post-menopausal  Other Topics Concern   Not on file  Social History Narrative   Widowed late 2019 after 40+ years of marriage   3 sons one daughter   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   No/never tobacco no drug use 1 caffeinated beverage daily, occasional or rare glass of wine   Social Determinants of Health   Financial Resource Strain: Not on file  Food Insecurity: Not on file  Transportation Needs: Not on file  Physical Activity: Not on file  Stress: Not on file  Social Connections: Not on file  Intimate Partner Violence: Not on file    Past Medical History, Surgical history, Social history, and Family history were reviewed and updated as appropriate.   M history of Lewey Body Dementia  Please see review of systems for further details on the patient's review from today.   Objective:   Physical Exam:  LMP 12/12/2005   Physical Exam Constitutional:      General: She is not in acute distress.    Appearance: She is obese.  Musculoskeletal:        General: No deformity.  Neurological:     Mental Status: She is alert and oriented to person, place, and time.     Cranial Nerves: No dysarthria.     Coordination: Coordination normal.  Psychiatric:        Attention and Perception: Attention and perception normal. She does not perceive auditory or visual hallucinations.        Mood and Affect: Mood is  anxious and depressed. Affect is tearful. Affect is not labile, blunt, angry or inappropriate.        Speech: Speech normal.        Behavior: Behavior normal. Behavior is cooperative.        Thought Content: Thought content normal. Thought content is not paranoid or delusional. Thought content does not include homicidal or suicidal ideation. Thought content does not include homicidal or suicidal plan.        Cognition and Memory: Cognition and memory normal.        Judgment: Judgment normal.     Comments: Insight fair More ruminative Crying on and off in office today as in the past dwelling on being lonely. Scared.     Lab Review:     Component Value Date/Time   NA 140 05/24/2021 1108   K 4.2 05/24/2021 1108   CL 105 05/24/2021 1108   CO2 28 05/24/2021 1108   GLUCOSE 102 05/24/2021 1108   BUN 20 05/24/2021 1108   CREATININE 0.85 05/24/2021 1108   CALCIUM 9.3 05/24/2021 1108   PROT 6.3 05/24/2021 1108   ALBUMIN 4.4 02/05/2015 0850   AST 12 05/24/2021 1108   ALT 15 05/24/2021 1108   ALKPHOS 53 02/05/2015 0850   BILITOT 0.2 05/24/2021 1108   GFRNONAA 72 05/24/2021 1108   GFRAA 83 05/24/2021 1108       Component Value Date/Time   WBC 4.5 05/24/2021 1108   RBC 3.83 05/24/2021 1108   HGB 12.4 05/24/2021 1108   HCT 37.0 05/24/2021 1108   PLT 295 05/24/2021 1108   MCV 96.6 05/24/2021 1108   MCH 32.4 05/24/2021 1108   MCHC 33.5 05/24/2021 1108   RDW 11.8 05/24/2021 1108   LYMPHSABS 1,148 08/27/2020 1126   MONOABS 0.6 03/23/2018 1244   EOSABS 34 08/27/2020 1126   BASOSABS 17 08/27/2020 1126  No results found for: POCLITH, LITHIUM   No results found for: PHENYTOIN, PHENOBARB, VALPROATE, CBMZ   .res Assessment: Plan:    Guiselle was seen today for follow-up, depression, anxiety and medication problem.  Diagnoses and all orders for this visit:  Severe episode of recurrent major depressive disorder, without psychotic features ([place])  Generalized anxiety  disorder  Panic disorder with agoraphobia  Pseudobulbar affect  Complicated grief  Insomnia due to mental condition  Obstructive sleep apnea    History of ministrokes  Greater than 50% of 50 min face to face time with patient was spent on counseling and coordination of care. We discussed Disc And her ongoing treatment resistant depression and anxiety.  She has failed to respond to SSRIs SNRIs and various atypicals. Disc she has failed to respond to 13 different psychiatric meds.   Markedly better with imipramine 100 mg combined with duloxetine 60.  However, she Relapsed again after son distanced emotionally from her so she's more alone. She wanted to change from imipramine despite the fact that it had been helpful and switched to Viibryd but then did not tolerate it well due to complaints of stomach pain and diarrhea by phone calls but decided to continue it as 40/30 mg QOD but no improvement.   Ongoing multiple phone calls between appts.  Rec ECT or Ivy. Disc in detail each procedure.   Dependency is markedly complicating treatment.  Her panic disorder is worse by her report with switch to Viibryd so far about 4 weeks on 30-40 mg daily.  Discussed alternatives to  Viibryd.   Viibryd has a low risk of causing dryness and is not severe about weight gain.  Option Remeron. Not likely to tolerate MAOI or other TCA. Option Seroquel. Option busipirone.  This is an inadequately weak medication to manage the severity of her symptoms.  Rec antipsychotic.  She's scared of antipsychotics despite at this point poor prognosis for response to antidepressant alone.  Afraid of weight gain.  Rec trial of Lybalvi DT weight gain with olanzapine.  5 mg daily. Refuses but asks to retry Abilify 10 (failed in past with different antidepressant)  with Viibryd 40.  Described rumination to her again in detail that this was something that was difficult for her to understand.  It is clearly evident in her case.   This particular symptom increases the need for an antipsychotic.  It is unlikely that an antidepressant alone will address the symptom or her depression in general  Nuedexta markedly helpful for crying until recent visit &  crying worsened again.  May get worse if stopped but will try to DC due to inadequate response.  Continue gabapentin 800 BID if she can remember  for TR anxiety.    She is not using the lorazepam excessively.  She is taking it appropriately.  The diazepam is prescribed vaginally by her gynecologist for chronic pelvic pain and not being used. We discussed the short-term risks associated with benzodiazepines including sedation and increased fall risk among others.  Discussed long-term side effect risk including dependence, potential withdrawal symptoms, and the potential eventual dose-related risk of dementia.  Disc the risk of Ambien amnesia.  Topiramate  100 mg BID.   To help weight and anxiety off label.  It was helping with weight loss and she's tolerating it. Eating more on imipramine.  It also is used for HA.   Naltrexone or metformin or Ozempic  are other options.  Treated for OSA by Dr. Baird Lyons.  Had Stopped counseling but agreed to restart counseling.  May need PCP but would have to drive to [facility] for it and it's not really availab.e Also could really benefit from support group.  Disc this at length.  If retires disc the importance of picking good Medicare D plan that will cover the Nudexta in case it's needed. Also needs to continue social contact.  This appt was 30 mins.  FU 4 weeks, make 2 appts DT chronic unstable and desperate  [name] Parents, MD, DFAPA  Please see After Visit Summary for patient specific instructions.    Future Appointments  Date Time Provider [place]  09/30/2021  3:30 PM Levin Erp, [place] [facility]  11/25/2021  4:00 PM Cottle, [place]., MD [facility] None    No orders of the defined types were placed in  this encounter.     -------------------------------

## 2021-09-28 ENCOUNTER — Other Ambulatory Visit (HOSPITAL_COMMUNITY): Payer: Self-pay

## 2021-09-28 MED ORDER — TRAVOPROST (BAK FREE) 0.004 % OP SOLN
OPHTHALMIC | 3 refills | Status: AC
  Filled 2021-09-28: qty 7.5, 90d supply, fill #0
  Filled 2022-03-14: qty 7.5, 90d supply, fill #1

## 2021-09-29 ENCOUNTER — Other Ambulatory Visit (HOSPITAL_COMMUNITY): Payer: Self-pay

## 2021-09-30 ENCOUNTER — Other Ambulatory Visit (HOSPITAL_COMMUNITY): Payer: Self-pay

## 2021-09-30 ENCOUNTER — Ambulatory Visit: Payer: 59 | Admitting: Physician Assistant

## 2021-09-30 ENCOUNTER — Encounter: Payer: Self-pay | Admitting: Physician Assistant

## 2021-09-30 VITALS — BP 104/60 | HR 60 | Ht 59.5 in | Wt 185.1 lb

## 2021-09-30 DIAGNOSIS — R1013 Epigastric pain: Secondary | ICD-10-CM | POA: Diagnosis not present

## 2021-09-30 DIAGNOSIS — K580 Irritable bowel syndrome with diarrhea: Secondary | ICD-10-CM | POA: Diagnosis not present

## 2021-09-30 MED ORDER — PANTOPRAZOLE SODIUM 40 MG PO TBEC
40.0000 mg | DELAYED_RELEASE_TABLET | Freq: Two times a day (BID) | ORAL | 1 refills | Status: AC
  Filled 2021-09-30 – 2021-12-11 (×2): qty 180, 90d supply, fill #0
  Filled 2022-03-12: qty 180, 90d supply, fill #1

## 2021-09-30 MED ORDER — DICYCLOMINE HCL 10 MG PO CAPS
10.0000 mg | ORAL_CAPSULE | Freq: Four times a day (QID) | ORAL | 2 refills | Status: DC | PRN
Start: 2021-09-30 — End: 2021-11-30
  Filled 2021-09-30: qty 90, 23d supply, fill #0

## 2021-09-30 NOTE — Progress Notes (Signed)
Chief Complaint: Stomach pain and diarrhea  HPI:    [name] [name] is a 65 year old female, known to Dr. Carlean Purl, with a past medical history as listed below including COPD, GERD, hiatal hernia, osteoarthritis and others listed below, who presents clinic today with complaint of stomach pain and diarrhea.    01/14/2019 colonoscopy done for diarrhea with diverticulosis in the sigmoid colon and otherwise normal.  Biopsies taken in the right and left colon for evaluation of microscopic colitis.  Biopsies were normal.  She was told to use loperamide as needed.  Also discussed pelvic floor PT due to pelvic floor dysfunction which Dr. Carlean Purl thought would help with her bowel issues.    05/24/2021 normal CBC and CMP.    09/27/2021 followed with her psychologist in regards to her anxiety.  It was noted that she had a episode of severe gastritis.  Eating more with her medicines that helped.  Also noted to have seasonal depression with the anniversary of her husband's death upcoming.  She was started on topiramate for weight and anxiety off label.  She agreed to restart counseling.    09/01/2021 patient seen at the urgent care in [place] with normal CBC, CMP with minimally decreased potassium, normal urinalysis.  She was prescribed Dicyclomine and Zantac.    Today, the patient explains that she started with a severe epigastric pain stabbing in nature about a month ago after being on Viibryd, new medicine for her chronic depression after her husband's death.  Tells me that the pain was so severe that she ended up in the urgent care as above and they gave her some Dicyclomine and Zantac which seemed to help the pain.  She also called the drug company and was told to make sure she ate a full meal prior to taking the tablet as abdominal pain was the "most common side effect".  Tells me this pain seems to radiate across to her right upper quadrant from her left upper quadrant.  Now this is only occasional and definitely  about 80 to 90% better.  Continues on Pantoprazole 40 mg twice daily reminds her history of a hiatal hernia.    Tells me when she started with the pain she also started with some diarrhea but around that time had her wisdom teeth out and was on Augmentin which she just finished.  Describes a lot of growling in her stomach.  Prior to this diarrhea was better controlled with only occasional episodes.  Tells me she has been eating yogurt twice a day because she cannot afford probiotics living on one income.    Still depressed with bouts of tearfulness, continues to follow with psychiatrist in regards to medications.  Tells me her life has just not been right since her husband passed away and we are coming up on the anniversary of this next week.    Denies fever, chills, weight loss, blood in her stool or symptoms that awaken her from sleep.  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 - no current med.   Glucose intolerance (  impaired glucose tolerance)    on Metformin   H/O hiatal hernia    "sliding"   History of echocardiogram    Echo 1/16: EF 60-65, no RWMA, Gr 1 DD, mild LAE, trivial pericardial eff post to heart   History of echocardiogram    Echo 11/17: EF 55-60, no RWMA, Gr 1 DD, normal RV function.   History of palpitations    Holter 12/15: NSR, PACs   Hx of migraines    Hypothyroidism    Osteoarthritis    knees   Positional headache since 1997   if lies on left side    Past Surgical History:  Procedure Laterality Date   CHOLECYSTECTOMY  04/21/2011   CHONDROPLASTY  10/19/2012   Procedure: CHONDROPLASTY;  Surgeon: Yvette Rack., MD;   Location: [place];  Service: Orthopedics;  Laterality: Right;   COLONOSCOPY     KNEE ARTHROSCOPY  10/19/2012   Procedure: ARTHROSCOPY KNEE;  Surgeon: Yvette Rack., MD;  Location: [place];  Service: Orthopedics;  Laterality: Right;   KNEE ARTHROSCOPY Right 02/01/2013   Procedure: RIGHT KNEE ARTHROSCOPY WITH MEDIAL MENISCECTOMY, DEBRIDEMENT CHONDROMALACIA PATELLA;  Surgeon: Yvette Rack., MD;  Location: [place];  Service: Orthopedics;  Laterality: Right;  RIGHT KNEE ARTHROSCOPY WITH MEDIAL MENISCECTOMY, DEBRIDEMENT CHONDROMALACIA PATELLA   KNEE ARTHROSCOPY Left 03/15/2013   Procedure: LEFT KNEE ARTHROSCOPY WITH DEBRIDEMENT/SHAVING CHONDROPLASTY WITH MEDIAL MENISECTOMY;  Surgeon: Yvette Rack., MD;  Location: [place];  Service: Orthopedics;  Laterality: Left;   KNEE ARTHROSCOPY WITH LATERAL MENISECTOMY  10/19/2012   Procedure: KNEE ARTHROSCOPY WITH LATERAL MENISECTOMY;  Surgeon: Yvette Rack., MD;  Location: [place];  Service: Orthopedics;  Laterality: Right;   TONSILLECTOMY AND ADENOIDECTOMY     age 69   TUBAL LIGATION  16   WISDOM TOOTH EXTRACTION      Current Outpatient Medications  Medication Sig Dispense Refill   acetaminophen (TYLENOL) 500 MG tablet Take by mouth.     albuterol (PROAIR HFA) 108 (90 Base) MCG/ACT inhaler Inhale 2 puffs into the lungs every 6 (six) hours as needed for wheezing or shortness of breath. Use as needed only  if your can't catch your breath (Patient taking differently: Inhale 2 puffs into the lungs every 6 (six) hours as needed for wheezing or shortness of breath. Use as needed only  if your can't catch your breath) 18 g 3   amLODipine (NORVASC) 5 MG tablet Take 1 tablet by mouth daily 30 tablet 6   ARIPiprazole (ABILIFY) 10 MG tablet Take 1 tablet (10 mg total) by mouth daily. 30 tablet 1   B Complex Vitamins (B-COMPLEX/B-12) LIQD Place 5 mLs under the tongue daily at 2  PM.     Cholecalciferol (VITAMIN D) 125 MCG (5000 UT) CAPS Take 5,000 Units by mouth daily.     Cholecalciferol 50 MCG (2000 UT) TABS Take by mouth.     desonide (DESOWEN) 0.05 % cream APPLY TO THE AFFECTED AREA(S) ON FACE UP TO TWO TIMES DAILY 30 g 3   Dextromethorphan-quiNIDine (NUEDEXTA) 20-10 MG capsule TAKE 1 CAPSULE BY MOUTH TWICE A DAY 60 capsule 2   fexofenadine (ALLEGRA) 180 MG tablet Take 180 mg by mouth daily.     gabapentin (NEURONTIN) 800 MG tablet Take 1 tablet (800 mg total) by mouth 4 (four) times daily. 120 tablet 1   hydrochlorothiazide (HYDRODIURIL) 12.5 MG tablet Take 1 tablet by mouth once  a day 30 tablet 6   ipratropium-albuterol (DUONEB) 0.5-2.5 (3) MG/3ML SOLN SMARTSIG:3 Milliliter(s) Via Nebulizer 3 Times Daily     LORazepam (ATIVAN) 1 MG tablet TAKE 1 TABLET BY MOUTH EVERY 6 HOURS AS NEEDED 60 tablet 1   metroNIDAZOLE (METROCREAM) 0.75 % cream APPLY TO FACE TWO TIMES DAILY 45 g 0   mometasone (NASONEX) 50 MCG/ACT nasal spray PLACE 2 SPRAYS INTO EACH NOSTRIL DAILY. 17 g 3   montelukast (SINGULAIR) 10 MG tablet Take 1 tablet by mouth once a day 30 tablet 6   nebivolol (BYSTOLIC) 10 MG tablet Take 1 tablet (10 mg total) by mouth 2 (two) times daily. 180 tablet 0   nebivolol (BYSTOLIC) 10 MG tablet TAKE 1 TABLET BY MOUTH 2 TIMES DAILY 180 tablet 0   neomycin-polymyxin b-dexamethasone (MAXITROL) 3.5-10000-0.1 OINT Place 1 application into the right eye at bedtime.     NP THYROID 120 MG tablet Take 1 tablet (120 mg total) by mouth daily before breakfast. 30 tablet 3   pantoprazole (PROTONIX) 40 MG tablet TAKE 1 TABLET BY MOUTH 2 TIMES DAILY 60 tablet 2   pantoprazole (PROTONIX) 40 MG tablet Take 1 tablet by mouth twice daily 180 tablet 1   thyroid (NP THYROID) 90 MG tablet Take 1 tablet (90 mg total) by mouth daily. 90 tablet 2   timolol (TIMOPTIC) 0.5 % ophthalmic solution Instill 1 drop in right eye twice a day 5 mL 3   topiramate (TOPAMAX) 100 MG tablet TAKE 1 TABLET BY  MOUTH 2 TIMES DAILY 180 tablet 0   Travoprost, BAK Free, (TRAVATAN Z) 0.004 % SOLN ophthalmic solution Instill 1 drop in each eye at bedtime. 7.5 mL 3   Travoprost, BAK Free, (TRAVATAN) 0.004 % SOLN ophthalmic solution Place 1 drop into both eyes at bedtime.     traZODone (DESYREL) 50 MG tablet Take 1 tablet (50 mg total) by mouth at bedtime. 90 tablet 1   Vilazodone HCl (VIIBRYD) 40 MG TABS Take 1 tablet (40 mg total) by mouth daily. 30 tablet 1   zolpidem (AMBIEN) 10 MG tablet TAKE 1/2 to 1 TABLET BY MOUTH AT BEDTIME AS NEEDED 30 tablet 1   No current facility-administered medications for this visit.    Allergies as of 09/30/2021 - Review Complete 09/27/2021  Allergen Reaction Noted   Levaquin [levofloxacin] Other (See Comments) 05/03/2012   Nickel Rash 03/12/2013    Family History  Problem Relation Age of Onset   Lung cancer Father 13       dies age 64   Emphysema Father    COPD Brother 37       died age 72   Stroke Mother    Hypertension Sister    Thyroid disease Sister        Graves Disease   Breast cancer Neg Hx     Social History   Socioeconomic History   Marital status: Widowed    Spouse name: Not on file   Number of children: Not on file   Years of education: Not on file   Highest education level: Not on file  Occupational History   Occupation: Land    Employer: [employer]  Tobacco Use   Smoking status: Never   Smokeless tobacco: Never  Vaping Use   Vaping Use: Never used  Substance and Sexual Activity   Alcohol use: Yes    Comment: occ   Drug use: No   Sexual activity: Yes    Partners: Male  Birth control/protection: Post-menopausal  Other Topics Concern   Not on file  Social History Narrative   Widowed late 2019 after 40+ years of marriage   3 sons one daughter   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   No/never tobacco no drug use 1 caffeinated beverage daily, occasional or rare glass of wine   Social  Determinants of Health   Financial Resource Strain: Not on file  Food Insecurity: Not on file  Transportation Needs: Not on file  Physical Activity: Not on file  Stress: Not on file  Social Connections: Not on file  Intimate Partner Violence: Not on file    Review of Systems:    Constitutional: No weight loss, fever or chills Skin: No rash  Cardiovascular: No chest pain  Respiratory: No SOB  Gastrointestinal: See HPI and otherwise negative Genitourinary: No dysuria  Neurological: No headache, dizziness or syncope Musculoskeletal: No new muscle or joint pain Hematologic: No bleeding  Psychiatric: +chronic depression   Physical Exam:  Vital signs: BP 104/60 (BP Location: Left Arm, Patient Position: Sitting, Cuff Size: Normal)   Pulse 60   Ht 4' 11.5" (1.511 m) Comment: height measured without shoes  Wt 185 lb 2 oz (84 kg)   LMP 12/12/2005   BMI 36.76 kg/m    Constitutional:   Pleasant overweight Caucasian female appears to be in NAD, Well developed, Well nourished, alert and cooperative Head:  Normocephalic and atraumatic. Eyes:   PEERL, EOMI. No icterus. Conjunctiva pink. Ears:  Normal auditory acuity. Neck:  Supple Throat: Oral cavity and pharynx without inflammation, swelling or lesion.  Respiratory: Respirations even and unlabored. Lungs clear to auscultation bilaterally.   No wheezes, crackles, or rhonchi.  Cardiovascular: Normal S1, S2. No MRG. Regular rate and rhythm. No peripheral edema, cyanosis or pallor.  Gastrointestinal:  Soft, nondistended, nontender. No rebound or guarding. Normal bowel sounds. No appreciable masses or hepatomegaly. Rectal:  Not performed.  Msk:  Symmetrical without gross deformities. Without edema, no deformity or joint abnormality.  Neurologic:  Alert and  oriented x4;  grossly normal neurologically.  Skin:   Dry and intact without significant lesions or rashes. Psychiatric: Demonstrates good judgement and reason without abnormal affect or  behaviors. Tearful  RELEVANT LABS AND IMAGING: CBC    Component Value Date/Time   WBC 4.5 05/24/2021 1108   RBC 3.83 05/24/2021 1108   HGB 12.4 05/24/2021 1108   HCT 37.0 05/24/2021 1108   PLT 295 05/24/2021 1108   MCV 96.6 05/24/2021 1108   MCH 32.4 05/24/2021 1108   MCHC 33.5 05/24/2021 1108   RDW 11.8 05/24/2021 1108   LYMPHSABS 1,148 08/27/2020 1126   MONOABS 0.6 03/23/2018 1244   EOSABS 34 08/27/2020 1126   BASOSABS 17 08/27/2020 1126    CMP     Component Value Date/Time   NA 140 05/24/2021 1108   K 4.2 05/24/2021 1108   CL 105 05/24/2021 1108   CO2 28 05/24/2021 1108   GLUCOSE 102 05/24/2021 1108   BUN 20 05/24/2021 1108   CREATININE 0.85 05/24/2021 1108   CALCIUM 9.3 05/24/2021 1108   PROT 6.3 05/24/2021 1108   ALBUMIN 4.4 02/05/2015 0850   AST 12 05/24/2021 1108   ALT 15 05/24/2021 1108   ALKPHOS 53 02/05/2015 0850   BILITOT 0.2 05/24/2021 1108   GFRNONAA 72 05/24/2021 1108   GFRAA 83 05/24/2021 1108    Assessment: 1.  Epigastric pain: A sharp pain in her left upper quadrant  which radiated to right upper quadrant about a month ago, seen in the urgent care with normal labs, better over the past month now that she is eating with her Viibryd medicine; consider reactive gastritis most likely +/- some functional dyspepsia 2.  Diarrhea: Colonoscopy for the same in 2020 with negative biopsies, patient described this that actually been better recently with only occasional episodes with some abdominal cramping likely IBS, most recently an increase after being on Augmentin for her wisdom teeth 3.  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  Reviewed recent labs from the urgent care 09/01/2021.  Patient's symptoms are better in general.  Tells me she did feel some better with Bentyl as she does get some occasional discomfort and diarrhea. 2.  Prescribed Bentyl 10 mg every 6 hours as needed #90 with 2 refills. 3.  Continue Pantoprazole 40 mg twice daily, 30-60 minutes before  breakfast and dinner.  #60 with 11 refills. 4.  Continue yogurt, Activia in particular as this has a lot of good probiotics if she cannot afford a pill form. 5.  Discussed with patient that some of the increase in diarrhea is likely from recent antibiotic and I suspect this will get better with time. 6.  Did discuss EGD today but patient is feeling better and symptoms are improving.  Explained that if she has another episode of severe pain she can call us and we will schedule her for an EGD with Dr. Carlean Purl in the [facility] 7.  Discussed that a lot of her symptoms are likely functional given her ongoing depression/anxiety. 8.  Patient wanted to have an appointment set with Dr. Carlean Purl.  Put her in recall for 3 months from now.  She will call prior to that if needed.  [name] Newer, PA-C [facility] 09/30/2021, 3:39 PM

## 2021-09-30 NOTE — Patient Instructions (Signed)
If you are age 66 or older, your body mass index should be between 23-30. Your Body mass index is 36.76 kg/m. If this is out of the aforementioned range listed, please consider follow up with your Primary Care Provider.  The [facility] providers would like to encourage you to use [facility] to communicate with providers for non-urgent requests or questions.  Due to long hold times on the telephone, sending your provider a message by [facility] may be faster and more efficient way to get a response. Please allow 48 business hours for a response.  Please remember that this is for non-urgent requests/questions.  Your Pantoprazole has been refilled.  MEDICATION: We have sent the following medication to your pharmacy for you to pick up at your convenience: Dicyclomine 10 MG tablet, take 1 tablet up to 4 times a day if needed for abdominal pain.  Please call our office if your epigastric pain becomes more severe and we will schedule an EGD.  Please follow up in 3 months with Dr. Carlean Purl.   It was great seeing you today! Thank you for entrusting me with your care and choosing [facility].  Ellouise Newer, [place]

## 2021-10-08 ENCOUNTER — Other Ambulatory Visit (HOSPITAL_COMMUNITY): Payer: Self-pay

## 2021-10-13 ENCOUNTER — Other Ambulatory Visit: Payer: Self-pay | Admitting: Psychiatry

## 2021-10-14 ENCOUNTER — Other Ambulatory Visit (HOSPITAL_COMMUNITY): Payer: Self-pay

## 2021-10-14 MED ORDER — ZOLPIDEM TARTRATE 10 MG PO TABS
ORAL_TABLET | ORAL | 1 refills | Status: DC
  Filled 2021-10-14: qty 30, 30d supply, fill #0
  Filled 2021-11-12: qty 30, 30d supply, fill #1

## 2021-10-25 ENCOUNTER — Other Ambulatory Visit: Payer: Self-pay | Admitting: Psychiatry

## 2021-10-25 ENCOUNTER — Other Ambulatory Visit (HOSPITAL_COMMUNITY): Payer: Self-pay

## 2021-10-25 DIAGNOSIS — F4001 Agoraphobia with panic disorder: Secondary | ICD-10-CM

## 2021-10-25 MED ORDER — LORAZEPAM 1 MG PO TABS
ORAL_TABLET | Freq: Four times a day (QID) | ORAL | 1 refills | Status: DC | PRN
  Filled 2021-10-25: qty 60, 15d supply, fill #0
  Filled 2021-12-02: qty 60, 15d supply, fill #1

## 2021-11-01 ENCOUNTER — Other Ambulatory Visit (HOSPITAL_COMMUNITY): Payer: Self-pay

## 2021-11-02 ENCOUNTER — Other Ambulatory Visit (HOSPITAL_COMMUNITY): Payer: Self-pay

## 2021-11-02 ENCOUNTER — Other Ambulatory Visit: Payer: Self-pay | Admitting: Psychiatry

## 2021-11-02 DIAGNOSIS — J302 Other seasonal allergic rhinitis: Secondary | ICD-10-CM

## 2021-11-02 DIAGNOSIS — I10 Essential (primary) hypertension: Secondary | ICD-10-CM

## 2021-11-02 DIAGNOSIS — J4540 Moderate persistent asthma, uncomplicated: Secondary | ICD-10-CM

## 2021-11-02 DIAGNOSIS — E559 Vitamin D deficiency, unspecified: Secondary | ICD-10-CM

## 2021-11-03 ENCOUNTER — Other Ambulatory Visit (HOSPITAL_COMMUNITY): Payer: Self-pay

## 2021-11-03 NOTE — Telephone Encounter (Signed)
You have not ever prescribed Gabapentin for her that I can tell in registry. If Rx appropriate please send.

## 2021-11-08 ENCOUNTER — Other Ambulatory Visit (HOSPITAL_COMMUNITY): Payer: Self-pay

## 2021-11-08 MED FILL — Gabapentin Tab 800 MG: ORAL | 30 days supply | Qty: 120 | Fill #0 | Status: AC

## 2021-11-11 ENCOUNTER — Other Ambulatory Visit (HOSPITAL_COMMUNITY): Payer: Self-pay

## 2021-11-12 ENCOUNTER — Other Ambulatory Visit (HOSPITAL_COMMUNITY): Payer: Self-pay

## 2021-11-17 ENCOUNTER — Telehealth: Payer: Self-pay | Admitting: Psychiatry

## 2021-11-17 ENCOUNTER — Other Ambulatory Visit: Payer: Self-pay

## 2021-11-17 MED ORDER — QUETIAPINE FUMARATE 25 MG PO TABS: ORAL_TABLET | ORAL | 0 refills | Status: DC

## 2021-11-17 NOTE — Telephone Encounter (Signed)
Rtc to pt and she reports having trouble sleeping for a little over a week but the last 4 days it has worsened. She reports taking Ambien 10 mg Trazodone 50 mg and 1/2 tablet of Lorazepam 1 mg at hs. She stays awake as long as she can depending on her work schedule. Last night she was up till 10:00 PM and slept till about 1:30 AM. This is when she couldn't go back to sleep like usual, she reports lots of anxiety. She  takes her thyroid medication and also 1/2 tablet of Lorazepam 1 m at 7:00 AM, lays back down until 8 AM but didn't sleep just dosed off and on. Then gets up for the day. She was tearful on the phone, sad and anxious. I asked her about anything new or changes that would upset her, she reports her daughter had her baby but he was in the [facility] for about 2 1/2 weeks. She reports he is home now and she goes to her daughters and is able to stay with the baby so her daughter can get some rest. So I encouraged her how great that is staying with the baby. Then she starts crying more and reports the family decided to not have Christmas at her house this year and they will have it at her son's house. She is very upset about that. Although they have done it there the last 2 years since her husband passed away. I encouraged her about it really doesn't matter where they meet it's about family time and enjoying being together at the holidays.  She agreed I was right but I know it still bothers her.  She would like a recommendation TODAY so she can get some sleep. I mentioned possibly increasing her Trazodone but I would call her back once he's reviewed her message.

## 2021-11-17 NOTE — Telephone Encounter (Signed)
[name] [name] called in with complaints of not being able to sleep for the past four days. States that this is causing her depression to worsen and she is really struggling with anxiety. She would like Traci or Dr Clovis Pu to give her a call today. She is "desperate." Ph: [phone]

## 2021-11-17 NOTE — Telephone Encounter (Signed)
Please call.

## 2021-11-17 NOTE — Telephone Encounter (Signed)
Rtc to pt and gave instructions on Trazodone then gave option of Seroquel. She is hesitant about raising Trazodone above 100 mg because she has gotten the hang over effect before. We then discussed Seroquel, she is hesitant but understands the low dose may be more effective for her sleep. After our discussion she will try trazodone 100 mg tonight then possibly 150 mg the next night. She did ask if the Seroquel could be sent in just in case she needed it and I was unavailable. Reassured her again of her options. She was pleased when we hung up

## 2021-11-17 NOTE — Telephone Encounter (Signed)
She can double her trazodone 200 mg nightly or even go to 150 mg nightly.  However this time of year she is likely to have more trouble given her unresolved grief.  She has been reluctant to take antipsychotics but the most effective solution would be Seroquel 25 to 50 mg.  She can do whichever she prefers but my recommendation is the Seroquel and stop the trazodone because it is most likely to be effective and has the potential to help her mood later on

## 2021-11-24 ENCOUNTER — Other Ambulatory Visit: Payer: Self-pay | Admitting: Psychiatry

## 2021-11-24 DIAGNOSIS — F482 Pseudobulbar affect: Secondary | ICD-10-CM

## 2021-11-24 DIAGNOSIS — F332 Major depressive disorder, recurrent severe without psychotic features: Secondary | ICD-10-CM

## 2021-11-25 ENCOUNTER — Ambulatory Visit (INDEPENDENT_AMBULATORY_CARE_PROVIDER_SITE_OTHER): Payer: 59 | Admitting: Psychiatry

## 2021-11-25 ENCOUNTER — Other Ambulatory Visit: Payer: Self-pay

## 2021-11-25 ENCOUNTER — Encounter: Payer: Self-pay | Admitting: Psychiatry

## 2021-11-25 ENCOUNTER — Other Ambulatory Visit (HOSPITAL_COMMUNITY): Payer: Self-pay

## 2021-11-25 ENCOUNTER — Telehealth: Payer: Self-pay | Admitting: Psychiatry

## 2021-11-25 DIAGNOSIS — F482 Pseudobulbar affect: Secondary | ICD-10-CM | POA: Diagnosis not present

## 2021-11-25 DIAGNOSIS — F332 Major depressive disorder, recurrent severe without psychotic features: Secondary | ICD-10-CM | POA: Diagnosis not present

## 2021-11-25 DIAGNOSIS — F411 Generalized anxiety disorder: Secondary | ICD-10-CM

## 2021-11-25 DIAGNOSIS — F4001 Agoraphobia with panic disorder: Secondary | ICD-10-CM

## 2021-11-25 DIAGNOSIS — F5105 Insomnia due to other mental disorder: Secondary | ICD-10-CM | POA: Diagnosis not present

## 2021-11-25 DIAGNOSIS — F607 Dependent personality disorder: Secondary | ICD-10-CM

## 2021-11-25 DIAGNOSIS — G4733 Obstructive sleep apnea (adult) (pediatric): Secondary | ICD-10-CM | POA: Diagnosis not present

## 2021-11-25 DIAGNOSIS — F4321 Adjustment disorder with depressed mood: Secondary | ICD-10-CM

## 2021-11-25 MED ORDER — NUEDEXTA 20-10 MG PO CAPS
1.0000 | ORAL_CAPSULE | Freq: Two times a day (BID) | ORAL | 2 refills | Status: AC
  Filled 2021-11-25: qty 60, 30d supply, fill #0
  Filled 2022-01-15: qty 60, 30d supply, fill #1

## 2021-11-25 MED ORDER — VILAZODONE HCL 40 MG PO TABS
40.0000 mg | ORAL_TABLET | Freq: Every day | ORAL | 1 refills | Status: DC
  Filled 2021-11-25: qty 30, 30d supply, fill #0

## 2021-11-25 NOTE — Patient Instructions (Addendum)
Get appt with Dr. Launa Grill, [facility] or Dr. Chucky May Or [facility] Psychiatry Department  Levonne Spiller, MD   Psychiatry   [facility], [address]. [place] , [place] [address]. [phone].

## 2021-11-25 NOTE — Progress Notes (Signed)
SHAINE MOUNT [id number] 1956-12-06 65 y.o.    Subjective:   Patient ID:  [name] [name] is a 65 y.o. (DOB [birth year]) female.  Chief Complaint:  Chief Complaint  Patient presents with   Follow-up   Depression   Anxiety   Sleeping Problem    Depression        Associated symptoms include myalgias and headaches.  Associated symptoms include no decreased concentration, no fatigue and no suicidal ideas. Medication Refill Associated symptoms include abdominal pain, headaches and myalgias. Pertinent negatives include no chest pain, fatigue or weakness.  [name] [name] presents to the office today for follow-up of anxiety.  seen April 2020.  She was dealing with grief and depression and sertraline was increased back to 200 mg daily.  [name] [name] died in 10/07/18  from cancer.  Married 41 years.  Is able to focus on positive memories.  Not ready to move.  seen August 2020.  No meds were changed.  She continued sertraline 200 mg daily.  seen January 2020.  Sertraline was unchanged.  She was having still grief issues related to the loss of her husband.  She was also having problems with weight attributed to the sertraline.  She was given a trial of topiramate to try to help offset some of the weight gain plus it can have some potential antianxiety effects.  seen 02/19/20 stating: Very depressed. So sad.  Don't want to get OOB.  Got lost coming here and late.  Worse for 3-4 weeks.  Days off are terrible.  Barely able to function at work.  Cry all the way home after work.  So lonely.  Kids aren't coming like they were bc they are busy.  Need to figure out her own life.  Life was centered around H and kids.  Denies suicidal thoughts  Made following med changes: Increase sertraline back to 300 mg daily.  She's aware it's higher than the expected usual dose. Rexulti 2 mg daily Topiramate increase to target 50 mg BID and possibly higher for obesity.  03/18/2020 appointment the following  is noted: No meds were changed. She didn't increase sertraline bc fear of weight gain.  Quite a bit different.  No crying.  Sad a few times.  Better in 5 days.  Feels much less depressed.  Better energy, interest, enjoyment.  Also weather helping and started planting.  More motivation.  Desperate to lose weight.  Has some chronic pain and taking gabapentin.   Started gym cycle and lost some weight.   Worried over D with depression and conflict with pt.  D was always closer to father than her.  D distanced from her and recent bad outburst with her.  D recently blocked communication with her.   Disc getting her help.  05/19/2020 appointment with the following noted: Tried to self medicate bc felt good on Rexulti and tried reducing sertraline to 150 for 2 weeks and got worse.  So increased back to 100 mg BID. Stopped cable and got too bored for 3 mos.  Restarted cable yesterday.   Still grieving.  Kids want her to sell the house but she can't. Anxiety in the morning fidgety.  Taken more Ativan lately with more panic and worry over the future.  Almost like too much caffeine before any.  Limits it. Better if busy. Does better with working.  Was too sleepy with progesterone. Successful losing weight on the topiramate. Assessment/Plan: Her panic disorder is generally controlled but more anxious  somewhat..She is still dealing with a lot of grief with waves of depression and anxiety and at times easily overwhelmed but functional.   trial Rexulti 3 mg daily for 2 weeks to see if can gain more benefit for depression and anxiety. If no benefit then reduce Rexulti back to 2 mg.  She'll call  06/01/2020 phone call reporting that 3 mg Rexulti seem to be helping and she wanted to continue it. 06/03/2020, 2 days later she called back stating it was making her too sleepy.  She was planning a trip and she was encouraged to stop the medicine until she returns from her trip and we would reevaluate. 06/11/2020 phone call  stating after stopping the Rexulti she was said, depressed and crying all the time and wanted to return to Rexulti at 1 mg daily to which that was agreed. 07/02/2020 she called back wanting to milligram tablets of Rexulti because she had increased it on her own to that dosage and felt better.  She also had stopped drinking wine in the evening which she felt like helped.  So at her request a prescription was submitted for 2 mg Rexulti to the pharmacy. 07/16/2020 phone call stating she was very lonely and wanted to go back up to 3 mg of Rexulti.  That request was refused because of her previous side effects and making frequent changes with a med with a very long half-life like Rexulti is not a good idea.  07/21/2020 appointment with the following noted: Pretty desperate to do something. She increased Rexulti AMA to 3 mg despite being asked not to do so. Still sleepy and anxious.  Stopped wine for several weeks Ativan 1 -2 mg daily at work bc very anxious. Yesterday when son was there she wasn't sleepy or depressed until he left. Denies drowsiness when driving. Feels no better with grief. Plan: Stop Rexulti DT sleepiness.   Start Latuda 20 mg daily but she refuses bc doesn't think she can eat enough with it.  Then says she'll try it.  Topiramate She would like to increase further; OK increase to 75 mg BID.  No therapy since husband died. Needs counseling desperately.  She commits.  09/22/2020 appointment with the following noted Multiple phone calls since the last visit in crisis. Tried Latuda up to 40 mg a day with 3 mg Ativan daily.  She felt like it made her worse and stopped it. Recommended haloperidol 2 mg tablets 1-2 nightly due to extreme unmanageable anxiety and fearfulness.   08/18/2020 telephone call with the following noted:Tried haloperidol 1 mg and didn't help anything.  No effect except a little sleepiness.  Took it sparingly bc afraid of it.  Afraid of tremors and TD.  I didn't to give  it a good chance.  Strongly rec ECT.  Currently will consider and agrees to go to [facility] for consult.  I'm scared.  Scared of meds too.  Currently on sertraline 200 mg daily.  Plan: Reduce sertraline to 150 daily and start duloxetine 30 mg capsule daily for 5 days, Then reduce sertraline to 100 mg and increase duloxetine to 2 capsules daily for 5 days, Then reduce sertraline to 1/2 of 100 mg aily and increase duloxetine to 90 mg daily for 5 days then stop sertraline and continue duloxetine 90 mg daily. Disc SE.  09/09/2020 extended phone call in crisis with nurse.  She was still transitioning from sertraline to duloxetine and trazodone was added for sleep. She's refusing ECT consult with [facility]  and didn't go. She has found a new therapist and seen her several times and she's local.  She is hopeful it might help. On duloxetine 90 mg since about 09/02/20. Never took buspirone but did receive it. Takes Ativan. Taking topiramate 75 mg in AM and on 50 mg PM.  Never increased to 75 mg BID. Takes Ambien initially and trazodone 25 mg HS and sleep not as good with duloxetine vs Zoloft.  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  Before that was sobbing and often not staying with herself. FMLA filled out for accomodation for leave if crying spells are unmanageable. Used to call H when she got in the car and son offered to have her call but he's less available.  So often cries in the car but onlly one bad crying spell after Nudexta. Hx migraine tx by neurologist who said MRI showed spots suggesting head injury.  Pt never remembers history of head injury. 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 Tomorrow is her and H's birthday and having family party tonight. Anniversary of 2 years is 10/25 and doing better about staying alone in her house. Uncertain mood effect from change to duloxetine so far but thinks maybe she's a  little better. Doesn't tolerate being alone very well.  Worry about winter. Plan continue Nuedexta was helpful for crying spells though it did not resolve them Continue duloxetine 90 mg to give it more time to work as it is only been about 3 weeks Okay per her request to increase topiramate to 75 mg twice daily.  10/26/2020 appointment with the following noted: Takes Ativan in morning.  Drinks more caffeine at home than work.  Might cause some anxiety.  But anxious at work too over insecurity and takes Ativan in morning also.  Takes it to drive. Average 2-3 Ativan daily.  Less anxious if not alone. On duloxetine 90 for 7 weeks. Crying spells still better on Nudexta than not.  Better than last time. Duloxetine is helping with depression and anxiety both are better than last visit.  Dep 5/10.  Anxiety  6/10 bc of work and driving.  Friends notice the benefit also. Not a winter person and dreads it. Not much appetite in evening but eats good lunch at work and breakfast.  Topiramate has curbed her appetite.   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   Work stressful and hates driving including in the winter and doesn't think she can do it anymore.   Sleep about 5 hours and sometimes can't get back to sleep on trazodone 50 and Ambien and Ativan but doesn't think she can take more trazodone DT hangover.  No amnesia.  Plan: Nuedexta markedly helpful for crying but hasn't resolved it. Duloxetine 90 on 7 weeks with benefit..  Increase dulxetine to max to see if can get more benefit bc sig residual sx. 120 mg daily.  11/25/2020 appointment with the following noted: Hard time. Since here.  Started crying a lot again.  Has been consistent with Nudexta.  Felt good for 3-4 days then last night had neg interaction with daughter while in men's section of [employer].  Got triggered over seeing men's clothes and D got mad.  She was crying and got a disoriented and had trouble finding her way out.  She doesn't understand  her grief.  Crying for weeks and then briefly better and recurred. Plan to retire Feb but scared to stay by herself.  Work  is only socialization she has and it's place she can enjoy things.   Never stayed alone before. I can't stay by myself.  Not afraid but lonely.  No hobbies. Plan: Nuedexta markedly helpful for crying until the last visit it started up again. Reduce Duloxetine to  90 mg bc increase didn't help after 4 weeks and the crying may be worse related to NE. Risperidone 1 mg BID prn   12/28/2020 appointment with the following noted: Was mainly taking gabapentin for pain and it's better sor reduced gabapentin to 2 daily for months.  Forgets to take it at work. Anxiety all day long. Risperidone prn bad crying day only once and it helped.  No SE Crying a lot since here when she is alone at home or in car.  Does not do it at work. Painful cry.  Sad and lonely.  Grief cry.   More anxious driving than she used to be. Using Ativan 1 mg average 2 daily.  Plan: Nuedexta markedly helpful for crying until the last visit it crying worsened again. Continue Duloxetine  90 mg bc increase didn't help after 4 weeks and the crying may be worse related to NE. 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  If fails after a week reduce back to current dose and instead increase Risperidone 1 mg BID.   01/27/2021 appt with following noted: Never  Remembered to take gabapentin QID but could take BID.  No SE. Since here more sad than anxious.  No panic lately. Still crying spells.  Only took risperidone once.  Most of crying in AM and off work at home alone. Is losing weight with topiramate and would like to go higher. Can feel alright when with family. Plan: Reduce duloxetine to 60 mg daily. Start imipramine 1 of the 25 mg tablets nightly for 1 week,  then 2 nightly for 1 week,  then 3 at night for 1 week. Wait 1 week and get the blood test, preferably in the morning.  02/25/2021  appointment with the following noted: Still crying, horrible yesterday on the way home.  Hopeless.  Still doesn't like being alone.  Has a new counselor.  Was invited to church function tomorrow. SE cotton mouth.  awakens 2-3 times a night.  Negative thoughts about taking so much medicine. Kids and gkids in [place] and can't move away.   Angry over tracking devices.    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   Continue imipramine 75 Hs and duloxetine 60 for now and get blood test ASAP.  03/12/2021 telephone note" :tC to correct instructions on dosing of imipramine.  Her blood level of imipramine on imipramine 75 Hs and duloxetine 60 is 108.  The level needs to be about 200.  Therefore have her increase imipramine  to 100 mg for 5 nights, then increase to 150 mg.  When she needs it I will send in a prescription for imipramine 50 mg tablets 3 nightly which is double the current strength of her 25 mg tablets. She should continue the duloxetine 60 mg daily until we can achieve an adequate blood level of imipramine which this increase should accomplish in about 1 week after increasing to 150 mg daily.  She understood.  She'll call after on 150 mg for a week and we will repeat the lab.  Lynder Parents, MD, [facility]  04/20/2021 appointment with the following noted: Got Covid in April.  Back to work 2 weeks.   Took her 2 weeks to recover. On imipramine 150 mg about 3-4 nights but had SE.  Had agitated sleep and moved things around while asleep on this dose.  She backed down to 100 mg  SE dry mouth is uncomfortable. A whole lot of improvement with depression and anxiety on the med overall.  Weather changes will affect her mood.  Still takes Ativan at work.  Driving still bothers her if in traffic. Still very lonely but has gotten out more. 2 different Bible studies good.   D 66 yo is pregnant with her first. Son sees the benefit from imipramine.  He's FT minister. Less crying.    Plan: Markedly better with imipramine 100 mg combined with duloxetine 60.  Disc DDI.   Repeat level  05/17/2021 appt noted: Didn't get blood level yet.   Emotionally doing OK. CO clenching teeth.  History of mouth guard made a good while ago and started using it again.   Started HA recently develops in the afternoon.  Doesn't have it when awakens.  Son noticed her clenching and grinding teeth.  Usually unaware of grinding teeth in the day.   Sleep is pretty good. Had a little more anxiety situationally.  Taken Ativan a little more. No full panic but some anxiety.   Went to [employer] where best friend is.  She noticed a big improvement.   SE dry mouth and bruxism. Doesn't like imipramine.   Missed one day of Nudexta and duloxetine and had a crying spell.  Otherwise those spells have been better. Plan: Markedly better with imipramine 100 mg combined with duloxetine 60.  Disc DDI.   Repeat level Then consider reducing duloxetine which might be contributing to the SE of bruxism. Stop trazodone and substitute cyclobenzaprine 10 mg HS for bruxism.  06/15/2021 appointment with following noted: Eye doc next week with extremely dry eyes from imipramine evidently.  FU next week. Doesn't want to stay on the medicine.  Disc her frustrations with the med even though she acknowledges the imipramine has helped her.  Residual depression and anxiety and chronic grief remained with fears about being alone.  She has been able to continue to work. Plan: Extensive discussion around the risk of stopping imipramine which is the only thing that has helped her recently.  Discussed alternatives. Reduce the imipramine to one of the 50 mg capsules for 1 week and then stop it Start Viibryd 20 mg tablet , start 1/2 tablet for 1 week, then 1 daily with food. Reduce duloxetine to 30 mg daily.  Will attempt to stop this.  DC cyclobenzaprine bc hangover  07/05/2021 phone call asking for refill of imipramine.  When questioned  about it she indicated she never stopped taking it despite what she indicated at the last visit.  07/19/2021 appointment with the following noted: Still on Cymbalta 60 mg daily, gabapentin 800 mg average twice daily bc forgets the rest, Lorazepam 1 mg average rarely. Topomax 100 mg BID, trazodone 50 mg HS, ambien 10 HS.   Not taken any Viibryd. Never stopped imipramine bc talked to son's who said she was so much better with the imipramine.  Talked to eye doctor which is better and maybe related to shingles.   Seeing dentist today with problems with teeth bc grinding her teeth. Crying all the time getting worse in the last few weeks.  Son's getting burned out on her neediness and she's not going to see him as  frequently and so he's less available which has made her more depressed and tearful. Had stopped therapy but plans to restart it bc needed.  I need to get out of the house.  Lonely. Has not checked on local support group lately. Depression is worse than last visit.  Didn't feel this level of depression last time but is experienced as extreme loneliness.  Neglecting house hold chores. Tries to talk on phone nightly with someone. Plan: Reduce the imipramine to one of the 50 mg capsules for 1 week and then stop it Start Viibryd 20 mg tablet , start 1/2 tablet for 1 week, then 1 daily with food. Reduce duloxetine to 30 mg daily for 2 weeks then stop it.  Will attempt to stop this. Rec antipsychotic.  She's scared of antipsychotics despite at this point poor prognosis for response to antidepressant alone.  Afraid of weight gain.  Rec trial of Lybalvi DT weight gain with olanzapine.  08/26/2021 phone call: Next visit is 09/27/21. Keri called in extreme anxiety with the Vybrid she has been on for six weeks. She states she is crying all the time. She would like to speak to someone regarding this. 08/26/2021 MD message:RTC  Anxiety worse after change Viibryd 20  and off imipramine.  She came off  duloxetine but felt bad and took a few for a several days. Did not take Lybalvi.  She looked up ruminating and didn't think that was what she was doing.  Plan: Increase Viibryd to 40 mg daily. Keep option of Lybalvi bc it can help TRD and anxiety based on the known benefit of olanzapine for the above. Asks for something for crying.  Nothing else to offer except antipsychotic and sh's not read to try it yet.  She agrees to increase Viibryd to 40 mg daily.  08/31/2021 phone call strings summarized as follows: Complaining of stomach pain and diarrhea which she attributed to Mad[place]. MD response: She has failed to respond to or not tolerated 13 different psychiatric medications.  She needs to be certain that the Viibryd is actually causing the diarrhea before she dismisses it as an option because the next option category is in the antipsychotic category and she is afraid of those medicines.  If she insists on coming off of Viibryd cut the dosage in half for 1 week and stop it. I will not start another antidepressant or consider another antidepressant until her appointment.  The only option I would consider is the suggestion at the last 2 appts of Lybalvi 5 mg and she can pick up samples.  If she refuses then we will wait until her scheduled appt or I can refer her for E[facility] at [facility] or [facility] or [facility]  09/27/21 appt noted: Had bought of severe gastritis and has appt with GI Thursday.  Eating more with med helped. Refused Lybalvi. Still feels bad.  Seasonal depression with anniversary of H's death upcoming.   Equally bad when last here and now without noticeable change with Viibryd.  Dryness better off imipramine.  Has ongoing fears driving and maybe that is worse.   CO EMA and worse than last time. Difficulty enjoying things. Cont rumination on H's death chronically. Plan: Rec antipsychotic.  She's scared of antipsychotics despite at this point poor prognosis for response to antidepressant alone.   Afraid of weight gain.  Rec trial of Lybalvi DT weight gain with olanzapine.  5 mg daily. Refuses but asks to retry Abilify 10 (failed in past with different antidepressant)  with Viibryd 40. Nuedexta markedly helpful for crying until recent visit &  crying worsened again.  May get worse if stopped but will try to DC due to inadequate response.  11/17/2021 extended phone call with nurse; Rtc to pt and gave instructions on Trazodone then gave option of Seroquel. She is hesitant about raising Trazodone above 100 mg because she has gotten the hang over effect before. We then discussed Seroquel, she is hesitant but understands the low dose may be more effective for her sleep. After our discussion she will try trazodone 100 mg tonight then possibly 150 mg the next night. She did ask if the Seroquel could be sent in just in case she needed it and I was unavailable. Reassured her again of her options  11/25/21 appt noted: Took quetiapine 25 mg last night as only time but had hangover and felt like it affected driving. Bad day today. Took Abilify maybe once or twice but stopped it and can't remember why. Scared in her home now without reason.  Lonely and doesn't want to be alone. Some death thoughts without sui plans or intent. Deperssed and anxious.  Past Psychiatric Medication Trials:  Zoloft 300, Lexapro, paroxetine 80 in 2017, Duloxetine 120 Imipramine 150 SE did have some benefit Nudexta helped crying for awhile until increase duloxetine and holidays Paliperidone, olanzapine, aripiprazole 20 mg, Rexulti 3 mg sleepy Latuda buspirone,  clonidine, topiramate, gabapentin, metformin,  phentermine,  trazodone, Ambien, Ativan, quetiapine 25 SE hangover Remote history topiramate for migraine 1998, Belviq helped lose 85#  Pt started [facility] 2016 H had ECT  Review of Systems:  Review of Systems  Constitutional:  Positive for activity change. Negative for fatigue and unexpected weight change.        Sweating  HENT:         Dry mouth  Eyes:  Positive for visual disturbance. Negative for pain.  Cardiovascular:  Negative for chest pain and palpitations.  Gastrointestinal:  Positive for abdominal pain and diarrhea. Negative for rectal pain.  Musculoskeletal:  Positive for myalgias.  Neurological:  Positive for headaches. Negative for dizziness, tremors and weakness.  Psychiatric/Behavioral:  Positive for agitation, behavioral problems, depression and dysphoric mood. Negative for confusion, decreased concentration, hallucinations, self-injury, sleep disturbance and suicidal ideas. The patient is nervous/anxious. The patient is not hyperactive.    Medications: I have reviewed the patient's current medications.  Current Outpatient Medications  Medication Sig Dispense Refill   albuterol (PROAIR HFA) 108 (90 Base) MCG/ACT inhaler Inhale 2 puffs into the lungs every 6 (six) hours as needed for wheezing or shortness of breath. Use as needed only  if your can't catch your breath (Patient taking differently: Inhale 2 puffs into the lungs every 6 (six) hours as needed for wheezing or shortness of breath. Use as needed only  if your can't catch your breath) 18 g 3   amLODipine (NORVASC) 5 MG tablet Take 1 tablet by mouth daily 30 tablet 6   B Complex Vitamins (B-COMPLEX/B-12) LIQD Place 5 mLs under the tongue daily at 2 PM.     Cholecalciferol (VITAMIN D) 125 MCG (5000 UT) CAPS Take 5,000 Units by mouth daily.     Cholecalciferol 50 MCG (2000 UT) TABS Take by mouth.     Dextromethorphan-quiNIDine (NUEDEXTA) 20-10 MG capsule TAKE 1 CAPSULE BY MOUTH TWICE A DAY (Patient taking differently: Take 1 capsule by mouth daily.) 60 capsule 2   dicyclomine (BENTYL) 10 MG capsule Take 1 capsule by mouth 4 (four) times daily as  needed for spasms (abdominal pain). 90 capsule 2   fexofenadine (ALLEGRA) 180 MG tablet Take 180 mg by mouth daily.     gabapentin (NEURONTIN) 800 MG tablet Take 1 tablet (800 mg total) by  mouth 4 (four) times daily. 120 tablet 1   hydrochlorothiazide (HYDRODIURIL) 12.5 MG tablet Take 1 tablet by mouth once a day 30 tablet 6   ipratropium-albuterol (DUONEB) 0.5-2.5 (3) MG/3ML SOLN SMARTSIG:3 Milliliter(s) Via Nebulizer 3 Times Daily     LORazepam (ATIVAN) 1 MG tablet TAKE 1 TABLET BY MOUTH EVERY 6 HOURS AS NEEDED 60 tablet 1   mometasone (NASONEX) 50 MCG/ACT nasal spray PLACE 2 SPRAYS INTO EACH NOSTRIL DAILY. 17 g 3   montelukast (SINGULAIR) 10 MG tablet Take 1 tablet by mouth once a day 30 tablet 6   nebivolol (BYSTOLIC) 10 MG tablet TAKE 1 TABLET BY MOUTH 2 TIMES DAILY 180 tablet 0   neomycin-polymyxin b-dexamethasone (MAXITROL) 3.5-10000-0.1 OINT Place 1 application into the right eye at bedtime.     pantoprazole (PROTONIX) 40 MG tablet Take 1 tablet by mouth 2 (two) times daily. 180 tablet 1   thyroid (NP THYROID) 90 MG tablet Take 1 tablet (90 mg total) by mouth daily. 90 tablet 2   timolol (TIMOPTIC) 0.5 % ophthalmic solution Instill 1 drop in right eye twice a day 5 mL 3   topiramate (TOPAMAX) 100 MG tablet TAKE 1 TABLET BY MOUTH 2 TIMES DAILY (Patient taking differently: Take by mouth as needed.) 180 tablet 0   Travoprost, BAK Free, (TRAVATAN Z) 0.004 % SOLN ophthalmic solution Instill 1 drop in each eye at bedtime. 7.5 mL 3   traZODone (DESYREL) 50 MG tablet Take 1 tablet (50 mg total) by mouth at bedtime. 90 tablet 1   Vilazodone HCl (VIIBRYD) 40 MG TABS Take 1 tablet (40 mg total) by mouth daily. 30 tablet 1   zolpidem (AMBIEN) 10 MG tablet TAKE 1/2 to 1 TABLET BY MOUTH AT BEDTIME AS NEEDED 30 tablet 1   ARIPiprazole (ABILIFY) 10 MG tablet Take 1 tablet (10 mg total) by mouth daily. (Patient not taking: Reported on 11/25/2021) 30 tablet 1   No current facility-administered medications for this visit.    Medication Side Effects: Other: ? sweating related.  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 - no current med.   Glucose intolerance (impaired glucose tolerance)    on Metformin   H/O hiatal hernia    "sliding"   History of echocardiogram    Echo 1/16: EF 60-65, no RWMA, Gr 1 DD, mild LAE, trivial pericardial eff post to heart   History of echocardiogram    Echo 11/17: EF 55-60, no RWMA, Gr 1 DD, normal RV function.   History of palpitations    Holter 12/15: NSR, PACs   Hx of migraines    Hypothyroidism    Osteoarthritis    knees   Positional headache  since 1997   if lies on left side    Family History  Problem Relation Age of Onset   Lung cancer Father 55       dies age 52   Emphysema Father    COPD Brother 38       died age 67   Stroke Mother    Hypertension Sister    Thyroid disease Sister        Graves Disease   Breast cancer Neg Hx     Social History   Socioeconomic History   Marital status: Widowed    Spouse name: Not on file   Number of children: Not on file   Years of education: Not on file   Highest education level: Not on file  Occupational History   Occupation: Land    Employer: [employer]  Tobacco Use   Smoking status: Never   Smokeless tobacco: Never  Vaping Use   Vaping Use: Never used  Substance and Sexual Activity   Alcohol use: Yes    Comment: occ   Drug use: No   Sexual activity: Yes    Partners: Male    Birth control/protection: Post-menopausal  Other Topics Concern   Not on file  Social History Narrative   Widowed late 2019 after 40+ years of marriage   3 sons one daughter   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   No/never tobacco no drug use 1 caffeinated beverage daily, occasional or rare glass of wine   Social Determinants of Health   Financial Resource Strain: Not on file  Food Insecurity: Not on file  Transportation Needs: Not on file  Physical Activity: Not on file  Stress: Not on file  Social Connections: Not on file  Intimate Partner Violence: Not on file    Past Medical History, Surgical history, Social history, and Family history were reviewed and updated as appropriate.   M history of Lewey Body Dementia  Please see review of systems for further details on the patient's review from today.   Objective:   Physical Exam:  LMP 12/12/2005   Physical Exam Constitutional:      General: She is not in acute distress.    Appearance: She is obese.  Musculoskeletal:        General: No deformity.  Neurological:     Mental Status: She is alert and oriented to person, place, and time.     Cranial Nerves: No dysarthria.     Coordination: Coordination normal.  Psychiatric:        Attention and Perception: Attention and perception normal. She does not perceive auditory or visual hallucinations.        Mood and Affect: Mood is anxious and depressed. Affect is tearful. Affect is not labile, blunt, angry or inappropriate.        Speech: Speech normal.        Behavior: Behavior normal. Behavior is cooperative.        Thought Content: Thought content normal. Thought content is not paranoid or delusional. Thought content does not include homicidal or suicidal ideation. Thought content does not include suicidal plan.        Cognition and Memory: Cognition and memory normal.        Judgment: Judgment normal.     Comments: Insight fair More ruminative Crying from the start dwelling on being lonely. Scared.     Lab Review:     Component Value Date/Time  NA 140 05/24/2021 1108   K 4.2 05/24/2021 1108   CL 105 05/24/2021 1108   CO2 28 05/24/2021  1108   GLUCOSE 102 05/24/2021 1108   BUN 20 05/24/2021 1108   CREATININE 0.85 05/24/2021 1108   CALCIUM 9.3 05/24/2021 1108   PROT 6.3 05/24/2021 1108   ALBUMIN 4.4 02/05/2015 0850   AST 12 05/24/2021 1108   ALT 15 05/24/2021 1108   ALKPHOS 53 02/05/2015 0850   BILITOT 0.2 05/24/2021 1108   GFRNONAA 72 05/24/2021 1108   GFRAA 83 05/24/2021 1108       Component Value Date/Time   WBC 4.5 05/24/2021 1108   RBC 3.83 05/24/2021 1108   HGB 12.4 05/24/2021 1108   HCT 37.0 05/24/2021 1108   PLT 295 05/24/2021 1108   MCV 96.6 05/24/2021 1108   MCH 32.4 05/24/2021 1108   MCHC 33.5 05/24/2021 1108   RDW 11.8 05/24/2021 1108   LYMPHSABS 1,148 08/27/2020 1126   MONOABS 0.6 03/23/2018 1244   EOSABS 34 08/27/2020 1126   BASOSABS 17 08/27/2020 1126    No results found for: POCLITH, LITHIUM   No results found for: PHENYTOIN, PHENOBARB, VALPROATE, CBMZ   .res Assessment: Plan:    [name] [name] was seen today for follow-up, depression, anxiety and sleeping problem.  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 We discussed Disc And her ongoing treatment resistant depression and anxiety.  She has failed to respond to SSRIs SNRIs and various atypicals. Disc she has failed to respond to 13 different psychiatric meds.   Was Markedly better with imipramine 100 mg combined with duloxetine 60.  However, she Relapsed again after son distanced emotionally from her so she's more alone. She wanted to change from imipramine despite the fact that it had been helpful and switched  to Viibryd but then did not tolerate it well due to complaints of stomach pain and diarrhea by phone calls but decided to continue it as 40/30 mg QOD but no improvement.   Ongoing multiple phone calls between appts.  Rec ECT or [place]. Disc in detail each procedure.  She refuses.   Dependency is markedly complicating treatment.  Her panic disorder is worse by her report with switch to Viibryd so far about 4 weeks on 30-40 mg daily.  Discussed alternatives to  Viibryd.   Viibryd has a low risk of causing dryness and is not severe about weight gain.  Option Remeron. Not likely to tolerate MAOI or other TCA.  Option busipirone.  This is an inadequately weak medication to manage the severity of her symptoms.  Rec antipsychotic.  She's scared of antipsychotics despite at this point poor prognosis for response to antidepressant alone.  Afraid of weight gain.  Rec trial of Lybalvi DT weight gain with olanzapine.    Described rumination to her again in detail that this was something that was difficult for her to understand.  It is clearly evident in her case.  This particular symptom increases the need for an antipsychotic.  It is unlikely that an antidepressant alone will address the symptom or her depression in general  Nuedexta markedly helpful for crying until recent visit &  crying worsened again.  May get worse if stopped but will try to DC due to inadequate response.  Continue gabapentin 800 BID if she can remember  for TR anxiety.    She is not using the lorazepam excessively.  She is taking it appropriately.  The diazepam is prescribed vaginally by her gynecologist for chronic pelvic pain and not being used. We discussed the short-term risks associated with benzodiazepines including sedation and increased fall risk among others.  Discussed long-term side effect risk including dependence, potential withdrawal symptoms, and the potential eventual dose-related risk of dementia.  Disc the risk of  Ambien amnesia.  Topiramate  100 mg BID.   To help weight and anxiety off label.  It was helping with weight loss and she's tolerating it. Eating more on imipramine.  It also is used for HA.   Naltrexone or metformin or Ozempic  are other options.  Treated for OSA by Dr. Baird Lyons.  Had Stopped counseling but agreed to restart counseling.  May need PCP but would have to drive to [facility] for it and it's not really availab.e Also could really benefit from support group.  Disc this at length.  If retires disc the importance of picking good Medicare D plan that will cover the Nudexta in case it's needed. Also needs to continue social contact.  Rec DBT for TR anxiety and depression and lack of response to other [place] 2nd opinion or referral bc is clearly not getting better.   Get appt with Dr. Launa Grill, [facility] or Dr. Chucky May Or [facility] Psychiatry Department Levonne Spiller, MD   [facility]   [facility], [address]. [place] , [place] [address]. [phone]. OR refer to [facility] or [place].  She wants MD to try to find names of doctors to refer.  This appt was 30 mins.  FU 4 weeks  Lynder Parents, MD, DFAPA  Please see After Visit Summary for patient specific instructions.    Future Appointments  Date Time Provider [place]  12/21/2021  4:00 PM Cottle, Billey Co., MD [facility] None  01/24/2022  4:00 PM Cottle, Billey Co., MD [facility] None    No orders of the defined types were placed in this encounter.     -------------------------------

## 2021-11-26 ENCOUNTER — Other Ambulatory Visit: Payer: Self-pay | Admitting: Psychiatry

## 2021-11-26 ENCOUNTER — Encounter (HOSPITAL_COMMUNITY): Payer: Self-pay | Admitting: Psychiatry

## 2021-11-26 ENCOUNTER — Other Ambulatory Visit (HOSPITAL_COMMUNITY): Payer: Self-pay

## 2021-11-26 ENCOUNTER — Inpatient Hospital Stay (HOSPITAL_COMMUNITY)
Admission: AD | Admit: 2021-11-26 | Discharge: 2021-11-30 | DRG: 885 | Disposition: A | Discharge status: Home / Self Care | Payer: 59 | Source: Intra-hospital | Attending: Psychiatry | Admitting: Psychiatry

## 2021-11-26 DIAGNOSIS — F419 Anxiety disorder, unspecified: Secondary | ICD-10-CM | POA: Diagnosis present

## 2021-11-26 DIAGNOSIS — E876 Hypokalemia: Secondary | ICD-10-CM | POA: Diagnosis present

## 2021-11-26 DIAGNOSIS — G4700 Insomnia, unspecified: Secondary | ICD-10-CM | POA: Diagnosis present

## 2021-11-26 DIAGNOSIS — F332 Major depressive disorder, recurrent severe without psychotic features: Secondary | ICD-10-CM | POA: Diagnosis present

## 2021-11-26 DIAGNOSIS — R5382 Chronic fatigue, unspecified: Secondary | ICD-10-CM | POA: Diagnosis present

## 2021-11-26 DIAGNOSIS — R4689 Other symptoms and signs involving appearance and behavior: Secondary | ICD-10-CM | POA: Diagnosis present

## 2021-11-26 DIAGNOSIS — I10 Essential (primary) hypertension: Secondary | ICD-10-CM | POA: Diagnosis present

## 2021-11-26 DIAGNOSIS — F607 Dependent personality disorder: Secondary | ICD-10-CM | POA: Diagnosis present

## 2021-11-26 DIAGNOSIS — Z20822 Contact with and (suspected) exposure to covid-19: Secondary | ICD-10-CM | POA: Diagnosis present

## 2021-11-26 DIAGNOSIS — Z818 Family history of other mental and behavioral disorders: Secondary | ICD-10-CM

## 2021-11-26 DIAGNOSIS — F54 Psychological and behavioral factors associated with disorders or diseases classified elsewhere: Secondary | ICD-10-CM | POA: Diagnosis present

## 2021-11-26 DIAGNOSIS — R45851 Suicidal ideations: Secondary | ICD-10-CM | POA: Diagnosis present

## 2021-11-26 DIAGNOSIS — F322 Major depressive disorder, single episode, severe without psychotic features: Secondary | ICD-10-CM | POA: Diagnosis present

## 2021-11-26 DIAGNOSIS — E039 Hypothyroidism, unspecified: Secondary | ICD-10-CM | POA: Diagnosis present

## 2021-11-26 DIAGNOSIS — F439 Reaction to severe stress, unspecified: Secondary | ICD-10-CM | POA: Diagnosis present

## 2021-11-26 LAB — RESP PANEL BY RT-PCR (FLU A&B, COVID) ARPGX2
Influenza A by PCR: NEGATIVE
Influenza B by PCR: NEGATIVE
SARS Coronavirus 2 by RT PCR: NEGATIVE

## 2021-11-26 MED ORDER — LORATADINE 10 MG PO TABS
10.0000 mg | ORAL_TABLET | Freq: Every day | ORAL | Status: DC
  Administered 2021-11-27 – 2021-11-30 (×4): 10 mg via ORAL
  Filled 2021-11-26 (×6): qty 1

## 2021-11-26 MED ORDER — DEXTROMETHORPHAN-QUINIDINE 20-10 MG PO CAPS
1.0000 | ORAL_CAPSULE | Freq: Two times a day (BID) | ORAL | Status: DC
  Administered 2021-11-27 – 2021-11-30 (×4): 1 via ORAL
  Filled 2021-11-26 (×11): qty 1

## 2021-11-26 MED ORDER — ZOLPIDEM TARTRATE 5 MG PO TABS
5.0000 mg | ORAL_TABLET | Freq: Every day | ORAL | Status: DC
  Administered 2021-11-26 – 2021-11-29 (×4): 5 mg via ORAL
  Filled 2021-11-26 (×3): qty 1

## 2021-11-26 MED ORDER — AMLODIPINE BESYLATE 5 MG PO TABS
5.0000 mg | ORAL_TABLET | Freq: Every day | ORAL | Status: DC
  Administered 2021-11-27 – 2021-11-30 (×4): 5 mg via ORAL
  Filled 2021-11-26 (×6): qty 1

## 2021-11-26 MED ORDER — GABAPENTIN 400 MG PO CAPS
800.0000 mg | ORAL_CAPSULE | Freq: Four times a day (QID) | ORAL | Status: DC
  Administered 2021-11-27 – 2021-11-30 (×13): 800 mg via ORAL
  Filled 2021-11-26 (×24): qty 2

## 2021-11-26 MED ORDER — VILAZODONE HCL 40 MG PO TABS
40.0000 mg | ORAL_TABLET | Freq: Every day | ORAL | Status: DC
  Administered 2021-11-27: 40 mg via ORAL
  Filled 2021-11-26 (×3): qty 1

## 2021-11-26 MED ORDER — MONTELUKAST SODIUM 10 MG PO TABS
10.0000 mg | ORAL_TABLET | Freq: Every day | ORAL | Status: DC
  Filled 2021-11-26: qty 1

## 2021-11-26 MED ORDER — THYROID 60 MG PO TABS
90.0000 mg | ORAL_TABLET | Freq: Every day | ORAL | Status: DC
  Administered 2021-11-27: 90 mg via ORAL
  Filled 2021-11-26 (×4): qty 1

## 2021-11-26 MED ORDER — MONTELUKAST SODIUM 10 MG PO TABS
10.0000 mg | ORAL_TABLET | Freq: Every day | ORAL | Status: DC
  Administered 2021-11-27 – 2021-11-30 (×4): 10 mg via ORAL
  Filled 2021-11-26 (×8): qty 1

## 2021-11-26 MED ORDER — PANTOPRAZOLE SODIUM 40 MG PO TBEC
40.0000 mg | DELAYED_RELEASE_TABLET | Freq: Two times a day (BID) | ORAL | Status: DC
  Administered 2021-11-26 – 2021-11-30 (×8): 40 mg via ORAL
  Filled 2021-11-26 (×16): qty 1

## 2021-11-26 MED ORDER — LORAZEPAM 1 MG PO TABS
1.0000 mg | ORAL_TABLET | Freq: Four times a day (QID) | ORAL | Status: DC | PRN
  Administered 2021-11-26: 1 mg via ORAL
  Filled 2021-11-26 (×2): qty 1

## 2021-11-26 MED ORDER — HYDROCHLOROTHIAZIDE 12.5 MG PO TABS
12.5000 mg | ORAL_TABLET | Freq: Every day | ORAL | Status: DC
  Administered 2021-11-27 – 2021-11-30 (×4): 12.5 mg via ORAL
  Filled 2021-11-26 (×6): qty 1

## 2021-11-26 MED ORDER — TRAZODONE HCL 50 MG PO TABS
50.0000 mg | ORAL_TABLET | Freq: Every evening | ORAL | Status: DC | PRN
  Administered 2021-11-26: 50 mg via ORAL
  Filled 2021-11-26: qty 1

## 2021-11-26 NOTE — Tx Team (Signed)
Initial Treatment Plan 11/26/2021 6:29 PM [name]:[id number]    PATIENT STRESSORS: Other: loss of significant other     PATIENT STRENGTHS: Average or above average intelligence  Communication skills  Supportive family/friends    PATIENT IDENTIFIED PROBLEMS: Depression  Suicidal thoughts  Paranoia  Ineffective coping skills               DISCHARGE CRITERIA:  Adequate post-discharge living arrangements Safe-care adequate arrangements made  PRELIMINARY DISCHARGE PLAN: Attend aftercare/continuing care group Outpatient therapy Return to previous living arrangement  PATIENT/FAMILY INVOLVEMENT: This treatment plan has been presented to and reviewed with the patient, [name] [name], and/or family member.  The patient and family have been given the opportunity to ask questions and make suggestions.  Vela Prose, RN 11/26/2021, 6:29 PM

## 2021-11-26 NOTE — Progress Notes (Signed)
Admission Note: Patient is a 64 years old female who presents as a walk-in for symptoms of depression and suicidal ideation.  Patient stated she didn't want to live anymore and is afraid to live alone.  Patient was tearful throughout assessment and was not able to participate with the admission process.  Admission plan of care reviewed with consent signed.  Personal belongings and skin assessment completed.  Skin is dry and intact.  No contraband found.  Patient was paranoid upon getting to the unit.  Requesting a female only unit due to paranoia.  Stated "I was told by a nurse practitioner that I was going to be in a female only unit, I'm afraid to go to my room."  Patient educated on unit setup and protocols but no evidence of learning as patient keep repeating and demanding to be on an all female unit.  Patient offered and educated on 72 hours request for discharge but declined.  Patient remain tearful on the unit.  Routine safety checks initiated.  Support and encouragement offered. Patient is safe on the unit.

## 2021-11-26 NOTE — H&P (Signed)
Behavioral Health Medical Screening Exam  [name] [name] is an 65 y.o. female with past psychiatric history of severe episode of recurrent major depressive disorder without psychotic features, generalized anxiety disorder, panic disorder with agoraphobia, pseudobulbar affect, complicated grief, insomnia due to mental condition, dependent personality disorder, obstructive sleep apnea, who presented to [facility] voluntarily for psychiatric assessment. 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 Receives outpatient medication management from [place]; last appointment 11/25/21 where it was documented she was referred to [facility], Dr Chucky May, or [facility] Psychiatry Department.   Patient states she doesn't feel safe at home. Reports increased intrusive suicidal thoughts; denies any intent or plan but states she has been looking at her medications. Currently has 2 adult children who she says lives [place] of her home but don't visit. She endorses increased depression; poor sleep, anhedonia, feelings of guilt and worthlessness, low energy, concentration, and appetite, and suicidal ideations. She is unable to contract for safety. Patient remained emotional and tearful throughout the assessment. Provider discussed safety concerns and recommendation of inpatient hospitalization, patient agreed.   Total Time spent with patient: 20 minutes  Psychiatric Specialty Exam: Physical Exam Vitals and nursing note reviewed.  Constitutional:      General: She is in acute distress.  HENT:     Head: Normocephalic.     Nose: Nose normal.     Mouth/Throat:     Mouth: Mucous membranes are moist.     Pharynx: Oropharynx is clear.  Eyes:     Pupils: Pupils are equal, round,  and reactive to light.  Cardiovascular:     Rate and Rhythm: Tachycardia present.     Pulses: Normal pulses.  Pulmonary:     Effort: Pulmonary effort is normal.  Musculoskeletal:        General: Normal range of motion.     Cervical back: Normal range of motion.  Skin:    General: Skin is warm and dry.  Neurological:     Mental Status: She is alert and oriented to person, place, and time.  Psychiatric:        Attention and Perception: She does not perceive auditory or visual hallucinations.        Mood and Affect: Mood is depressed. Affect is tearful.        Speech: Speech is rapid and pressured.        Behavior: Behavior is cooperative.        Thought Content: Thought content includes suicidal ideation.        Cognition and Memory: Cognition and memory normal.        Judgment: Judgment is impulsive and inappropriate.   Review of Systems  Constitutional:  Positive for activity change and appetite change.  Psychiatric/Behavioral:  Positive for decreased concentration, dysphoric mood, sleep disturbance and suicidal ideas. The patient is nervous/anxious.   All other systems reviewed and are negative. Blood pressure (!) 155/70, pulse 66, temperature 100 F (37.8 C), temperature source Oral, resp. rate 16, last menstrual period 12/12/2005, SpO2 98 %.There is no height or weight on file to calculate BMI. General Appearance: Casual Eye Contact:  Fair Speech:  Pressured Volume:  Normal Mood:  Depressed and Hopeless Affect:  Labile and Tearful Thought Process:  Goal Directed Orientation:  Full (Time, Place, and Person) Thought Content:  Paranoid Ideation and Rumination Suicidal Thoughts:  Yes.  without intent/plan Homicidal Thoughts:  No Memory:  Immediate;   Fair Recent;   Fair Remote;   Fair Judgement:  Poor Insight:  Present Psychomotor Activity:  Normal Concentration: Concentration: Fair and Attention Span: Fair Recall:  Good Fund of Knowledge:Good Language: Good Akathisia:   NA Handed:  Right AIMS (if indicated):    Assets:  Communication Skills Desire for Improvement Financial Resources/Insurance [place] Talents/Skills Transportation Vocational/Educational Sleep:     Musculoskeletal: Strength & Muscle Tone: within normal limits Gait & Station: normal Patient leans: N/A  Blood pressure (!) 155/70, pulse 66, temperature 100 F (37.8 C), temperature source Oral, resp. rate 16, last menstrual period 12/12/2005, SpO2 98 %.  Recommendations: Based on my evaluation the patient does not appear to have an emergency medical condition. Patient referred for inpatient psychiatric hospitalization; accepted to [facility].   Inda Merlin, NP 11/26/2021, 2:07 PM

## 2021-11-26 NOTE — BH Assessment (Addendum)
Comprehensive Clinical Assessment (CCA) Note  11/26/2021 GRACELYN COVENTRY [id number]  Disposition: TTS completed. Per [facility] provider Oneida Alar, NP), patient meets criteria for inpatient psychiatric treatment. The [place] Claiborne Billings S., RN) will assign patient a bed to the adult unit at [facility].   [facility] Admission (Current) from OP Visit from 11/26/2021 in [facility]  C-SSRS RISK CATEGORY High Risk       The patient demonstrates the following risk factors for suicide: Chronic risk factors for suicide include: psychiatric disorder of Major Depressive Disorder, Recurrent, Severe, without psychotic features and Anxiety Disorder . Acute risk factors for suicide include: social withdrawal/isolation, loss (financial, interpersonal, professional), and grief . Protective factors for this patient include: responsibility to others (children, family). Considering these factors, the overall suicide risk at this point appears to be high. Patient is appropriate for outpatient follow up when psych cleared by psychiatry.  Chief Complaint:  Chief Complaint  Patient presents with   mdd   Suicidal   Visit Diagnosis: Major Depressive Disorder, Recurrent, Severe, without psychotic features and Anxiety Disorder  Yarelly Kuba is a 65 y/o female that presents to [facility] as a walk-in. She is a self referral. However, says that family, friends, and her therapist has recommended that she "got to the hospital". Patient is observantly distraught and tearful (sobbing).   Patient explains that Dr. Clovis Pu is her current psychiatrist. She started seeing Dr. Clovis Pu in 03/13/15 after the death of her spouse. Patient married to spouse over 76 yrs. States that it's been very lonely without him being around anymore. She is often afraid to be alone in the home and doesn't feel that she has anyone to speak with about her grief. States that she has supports from church and close friends but  feels that they are tired of her "crying all the time". Patient feels that she is a burden. Depressive symptoms include: worthlessness, tearfulness, and anxiety. States that her anxiety is severe. Appetite is fair. No significant weight gain and/or loss. Her sleep regiment is poor. Her psychiatrist as of 2 days ago changed her medications to help her sleep, she was instructed to take Trazadone and Ambien together. However, states that she had a bad reaction when she woke up this morning. She woke up around 5am, as she was driving to work she was very confused and had difficulty finding her way. She finally arrived to work and was so overfilled with emotion that she was asked by her supervisor to go home for the day.   She has 4 children and 5 grandchildren. States that her children do not come around to to visit, except during the summer months to do yard work. She doesn't get to see her grandchildren much. However, identifies her grand children as protective factors.  Patient is suicidal with plan and/intent. She has felt suicidal intermittently since the death of her spouse. States that she has a lot of pills at home and unsure if she contract for safety. Patient sobbing, "I don't know what I will do, I just don't know". She has acces to means (pills). No hx of suicide attempts and/or gestures.   Denies homicidal ideations. Denies hx of aggressive and/assault ive behaviors. Denies legal issues and/or upcoming court dates. Denies AVH's. Denies alcohol and/or drug use. Patient's therapist is Terance Ice. Her psychiatrist is Dr. Clovis Pu, whom she has seen since 03-13-15. States that Dr. Clovis Pu is terminating their therapeutic relationship after 2 more visits. Patient tearfully says that  Dr. Clovis Pu told her that it's no hope for her and he has done the best that he can with her med management.  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 Patient lives alone. She has supportive  friends. No hx of trauma and/or abuse.   CCA Screening, Triage and Referral (STR)  Patient Reported Information How did you hear about [place]? No data recorded What Is the Reason for Your Visit/Call Today? No data recorded How Long Has This Been Causing You Problems? No data recorded What Do You Feel Would Help You the Most Today? No data recorded  Have You Recently Had Any Thoughts About Hurting Yourself? No data recorded Are You Planning to Commit Suicide/Harm Yourself At This time? No data recorded  Have you Recently Had Thoughts About [place]? No data recorded Are You Planning to Harm Someone at This Time? No data recorded Explanation: No data recorded  Have You Used Any Alcohol or Drugs in the Past 24 Hours? No data recorded How Long Ago Did You Use Drugs or Alcohol? No data recorded What Did You Use and How Much? No data recorded  Do You Currently Have a Therapist/Psychiatrist? No data recorded Name of Therapist/Psychiatrist: No data recorded  Have You Been Recently Discharged From Any Office Practice or Programs? No data recorded Explanation of Discharge From Practice/Program: No data recorded    CCA Screening Triage Referral Assessment Type of Contact: No data recorded Telemedicine Service Delivery:   Is this Initial or Reassessment? No data recorded Date Telepsych consult ordered in [facility]:  No data recorded Time Telepsych consult ordered in [facility]:  No data recorded Location of Assessment: No data recorded Provider Location: No data recorded  Collateral Involvement: No data recorded  Does Patient Have a [place]? No data recorded Name and Contact of Legal Guardian: No data recorded If Minor and Not Living with Parent(s), Who has Custody? No data recorded Is CPS involved or ever been involved? No data recorded Is APS involved or ever been involved? No data recorded  Patient Determined To Be At Risk for Harm To Self or Others Based on  Review of Patient Reported Information or Presenting Complaint? No data recorded Method: No data recorded Availability of Means: No data recorded Intent: No data recorded Notification Required: No data recorded Additional Information for Danger to Others Potential: No data recorded Additional Comments for Danger to Others Potential: No data recorded Are There Guns or Other Weapons in Your Home? No data recorded Types of Guns/Weapons: No data recorded Are These Weapons Safely Secured?                            No data recorded Who Could Verify You Are Able To Have These Secured: No data recorded Do You Have any Outstanding Charges, Pending Court Dates, Parole/Probation? No data recorded Contacted To Inform of Risk of Harm To Self or Others: No data recorded   Does Patient Present under Involuntary Commitment? No data recorded IVC Papers Initial File Date: No data recorded  [place] of Residence: No data recorded  Patient Currently Receiving the Following Services: No data recorded  Determination of Need: No data recorded  Options For Referral: No data recorded    CCA Biopsychosocial Patient Reported Schizophrenia/Schizoaffective Diagnosis in Past: No   Strengths: No data recorded  Mental Health Symptoms Depression:   Difficulty Concentrating; Fatigue; Hopelessness; Increase/decrease in appetite; Irritability;  Change in energy/activity; Sleep (too much or little); Tearfulness; Weight gain/loss; Worthlessness   Duration of Depressive symptoms:  Duration of Depressive Symptoms: Greater than two weeks   Mania:   None   Anxiety:    Difficulty concentrating; Fatigue; Irritability; Restlessness; Sleep; Tension   Psychosis:   None   Duration of Psychotic symptoms:    Trauma:   Emotional numbing   Obsessions:   Disrupts routine/functioning; Intrusive/time consuming; Attempts to suppress/neutralize   Compulsions:   Intrusive/time consuming; Poor Insight   Inattention:    Fails to pay attention/makes careless mistakes   Hyperactivity/Impulsivity:   None   Oppositional/Defiant Behaviors:   None   Emotional Irregularity:   None   Other Mood/Personality Symptoms:  No data recorded   Mental Status Exam Appearance and self-care  Stature:   Average   Weight:  No data recorded  Clothing:   Neat/clean   Grooming:   Normal   Cosmetic use:   None   Posture/gait:   Normal   Motor activity:   Restless   Sensorium  Attention:   Normal; Distractible   Concentration:   Anxiety interferes; Preoccupied; Scattered; Focuses on irrelevancies   Orientation:   Time; Situation; Place; Person; Object   Recall/memory:   Normal   Affect and Mood  Affect:   Depressed; Flat   Mood:   Depressed; Anxious   Relating  Eye contact:   None   Facial expression:   Depressed   Attitude toward examiner:   Cooperative   Thought and Language  Speech flow:  Clear and Coherent   Thought content:   Appropriate to Mood and Circumstances   Preoccupation:   Ruminations   Hallucinations:   None   Organization:  No data recorded  [employer] of Knowledge:   Average   Intelligence:   Average   Abstraction:   Normal   Judgement:   Impaired   Reality Testing:   Adequate   Insight:   Lacking; Poor   Decision Making:   Confused   Social Functioning  Social Maturity:   Isolates   Social Judgement:   Normal   Stress  Stressors:   Transitions; Grief/losses   Coping Ability:   Exhausted; Overwhelmed   Skill Deficits:   Interpersonal   Supports:   Family; Support needed; Church; Friends/Service system     Religion: Religion/Spirituality Are You A Religious Person?: Yes What is Your Religious Affiliation?:  (unknown)  Leisure/Recreation: Leisure / Recreation Do You Have Hobbies?:  (None reported)  Exercise/Diet: Exercise/Diet Do You Exercise?: No Do You Follow a Special Diet?: No Do You Have  Any Trouble Sleeping?: Yes Explanation of Sleeping Difficulties: Patient has not been sleeping well. Psychiatrist made med adjustments Trazondone and Ambien combination and she had a bad effect. Started taking medications last night and she woke up confused.   CCA Employment/Education Employment/Work Situation: Employment / Work Situation Employment Situation: Employed Work Stressors: Yes; sent home from work today; states that her coworker noticed that she was very emotional; coworker told Fish farm manager; the supervisor sent her home. Patient's Job has Been Impacted by Current Illness: Yes Describe how Patient's Job has Been Impacted: Patient was sent home today due to emotional state, crying, and difficult concentrating. Has Patient ever Been in the [employer]?: No  Education: Education Is Patient Currently Attending School?: No Did [place]?: No Did You Have An Individualized Education Program (IIEP): No Did You Have Any Difficulty At School?: No Patient's Education Has Been  Impacted by Current Illness: No   CCA Family/Childhood History Family and Relationship History: Family history Marital status: Widowed Widowed, when?: 2016 Does patient have children?: Yes How many children?:  (4) How is patient's relationship with their children?: States that children don't come around much. She feels that her sons only come around to cut the grass during the summer. Also, her daughter is busy with her new grand daughter....plus it's difficult for her daughter to come around as she is also still grieving.  Childhood History:  Childhood History By whom was/is the patient raised?: Both parents Did patient suffer any verbal/emotional/physical/sexual abuse as a child?: No Did patient suffer from severe childhood neglect?: No Has patient ever been sexually abused/assaulted/raped as an adolescent or adult?: No Was the patient ever a victim of a crime or a disaster?: No Witnessed  domestic violence?: No Has patient been affected by domestic violence as an adult?: No  Child/Adolescent Assessment:     CCA Substance Use Alcohol/Drug Use: Alcohol / Drug Use Pain Medications: SEE MAR Prescriptions: SEE MAR Over the Counter: SEE MAR History of alcohol / drug use?: No history of alcohol / drug abuse                         ASAM's:  Six Dimensions of Multidimensional Assessment  Dimension 1:  Acute Intoxication and/or Withdrawal Potential:      Dimension 2:  Biomedical Conditions and Complications:      Dimension 3:  Emotional, Behavioral, or Cognitive Conditions and Complications:     Dimension 4:  Readiness to Change:     Dimension 5:  Relapse, Continued use, or Continued Problem Potential:     Dimension 6:  Recovery/Living Environment:     ASAM Severity Score:    ASAM Recommended Level of Treatment:     Substance use Disorder (SUD)    Recommendations for Services/Supports/Treatments:    Discharge Disposition:    DSM5 Diagnoses: Patient Active Problem List   Diagnosis Date Noted   Black stools 08/14/2019   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   Anxiety 12/09/2018   Obstructive sleep apnea 03/23/2018   Fever 03/23/2018   Chronic fatigue 02/28/2018   Essential hypertension 02/17/2018   Morbid obesity ([place]) 02/17/2018   Adrenal disorder ([place]) 01/29/2015   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

## 2021-11-27 DIAGNOSIS — K829 Disease of gallbladder, unspecified: Secondary | ICD-10-CM | POA: Insufficient documentation

## 2021-11-27 LAB — COMPREHENSIVE METABOLIC PANEL
ALT: 54 U/L — ABNORMAL HIGH (ref 0–44)
AST: 37 U/L (ref 15–41)
Albumin: 3.9 g/dL (ref 3.5–5.0)
Alkaline Phosphatase: 85 U/L (ref 38–126)
Anion gap: 9 (ref 5–15)
BUN: 16 mg/dL (ref 8–23)
CO2: 26 mmol/L (ref 22–32)
Calcium: 9 mg/dL (ref 8.9–10.3)
Chloride: 104 mmol/L (ref 98–111)
Creatinine, Ser: 0.97 mg/dL (ref 0.44–1.00)
GFR, Estimated: >60 mL/min (ref >60)
Glucose, Bld: 130 mg/dL — ABNORMAL HIGH (ref 70–99)
Potassium: 3 mmol/L — ABNORMAL LOW (ref 3.5–5.1)
Sodium: 139 mmol/L (ref 135–145)
Total Bilirubin: 0.8 mg/dL (ref 0.3–1.2)
Total Protein: 6.9 g/dL (ref 6.5–8.1)

## 2021-11-27 LAB — LIPID PANEL
Cholesterol: 219 mg/dL — ABNORMAL HIGH (ref 0–200)
HDL: 66 mg/dL (ref >40)
LDL Cholesterol: 138 mg/dL — ABNORMAL HIGH (ref 0–99)
Total CHOL/HDL Ratio: 3.3 RATIO
Triglycerides: 77 mg/dL (ref <150)
VLDL: 15 mg/dL (ref 0–40)

## 2021-11-27 LAB — TSH: TSH: 0.059 u[IU]/mL — ABNORMAL LOW (ref 0.350–4.500)

## 2021-11-27 LAB — HEMOGLOBIN A1C
Hgb A1c MFr Bld: 5.6 % (ref 4.8–5.6)
Mean Plasma Glucose: 114.02 mg/dL

## 2021-11-27 MED ORDER — IMIPRAMINE HCL 25 MG PO TABS
25.0000 mg | ORAL_TABLET | Freq: Every day | ORAL | Status: DC
  Administered 2021-11-27: 25 mg via ORAL
  Filled 2021-11-27 (×3): qty 1

## 2021-11-27 MED ORDER — CHLORPROMAZINE HCL 10 MG PO TABS
10.0000 mg | ORAL_TABLET | Freq: Two times a day (BID) | ORAL | Status: DC
  Administered 2021-11-27 – 2021-11-28 (×2): 10 mg via ORAL
  Filled 2021-11-27 (×7): qty 1

## 2021-11-27 MED ORDER — TIMOLOL MALEATE 0.5 % OP SOLN
1.0000 [drp] | Freq: Every day | OPHTHALMIC | Status: DC
  Filled 2021-11-27: qty 5

## 2021-11-27 MED ORDER — TRAZODONE HCL 50 MG PO TABS
50.0000 mg | ORAL_TABLET | Freq: Every evening | ORAL | Status: DC | PRN
  Administered 2021-11-27 – 2021-11-29 (×3): 50 mg via ORAL
  Filled 2021-11-27 (×3): qty 1

## 2021-11-27 MED ORDER — TOPIRAMATE 100 MG PO TABS
100.0000 mg | ORAL_TABLET | Freq: Two times a day (BID) | ORAL | Status: DC
  Administered 2021-11-27 – 2021-11-28 (×2): 100 mg via ORAL
  Filled 2021-11-27 (×8): qty 1

## 2021-11-27 MED ORDER — POTASSIUM CHLORIDE CRYS ER 20 MEQ PO TBCR
40.0000 meq | EXTENDED_RELEASE_TABLET | Freq: Two times a day (BID) | ORAL | Status: AC
  Administered 2021-11-27: 40 meq via ORAL
  Filled 2021-11-27 (×3): qty 2

## 2021-11-27 MED ORDER — THYROID 30 MG PO TABS
90.0000 mg | ORAL_TABLET | ORAL | Status: DC
  Administered 2021-11-28 – 2021-11-30 (×3): 90 mg via ORAL
  Filled 2021-11-27 (×5): qty 3

## 2021-11-27 MED ORDER — FLUTICASONE PROPIONATE 50 MCG/ACT NA SUSP
1.0000 | Freq: Every day | NASAL | Status: DC
Start: 2021-11-27 — End: 2021-11-29
  Administered 2021-11-27 – 2021-11-28 (×2): 1 via NASAL
  Filled 2021-11-27 (×2): qty 16

## 2021-11-27 MED ORDER — LORAZEPAM 0.5 MG PO TABS
0.5000 mg | ORAL_TABLET | Freq: Four times a day (QID) | ORAL | Status: DC | PRN
Start: 2021-11-27 — End: 2021-11-30
  Administered 2021-11-27: 1 mg via ORAL
  Administered 2021-11-27: 0.5 mg via ORAL
  Administered 2021-11-28: 22:00:00 1 mg via ORAL
  Administered 2021-11-29 – 2021-11-30 (×3): 0.5 mg via ORAL
  Filled 2021-11-27: qty 1
  Filled 2021-11-27: qty 2
  Filled 2021-11-27: qty 1
  Filled 2021-11-27: qty 2
  Filled 2021-11-27 (×2): qty 1

## 2021-11-27 MED ORDER — DICYCLOMINE HCL 10 MG PO CAPS
10.0000 mg | ORAL_CAPSULE | Freq: Once | ORAL | Status: AC
  Administered 2021-11-27: 10 mg via ORAL
  Filled 2021-11-27 (×2): qty 1

## 2021-11-27 MED ORDER — WHITE PETROLATUM EX OINT
TOPICAL_OINTMENT | CUTANEOUS | Status: AC
  Filled 2021-11-27: qty 5

## 2021-11-27 MED ORDER — LATANOPROST 0.005 % OP SOLN
1.0000 [drp] | Freq: Every day | OPHTHALMIC | Status: DC
  Administered 2021-11-28 – 2021-11-29 (×2): 1 [drp] via OPHTHALMIC
  Filled 2021-11-27 (×2): qty 2.5

## 2021-11-27 NOTE — BHH Group Notes (Signed)
Psychoeducational Group Note    Date:12/17//2022 Time: 1300-1400    Purpose of Group: . The group focus' on teaching patients on how to identify their needs and their Life Skills:  A group where two lists are made. What people need and what are things that we do that are unhealthy. The lists are developed by the patients and it is explained that we often do the actions that are not healthy to get our list of needs met.  Goal:: to develop the coping skills needed to get their needs met  Participation Level:  Active  Participation Quality:  Appropriate  Affect:  Appropriate  Cognitive:  Oriented  Insight:  Improving  Engagement in Group:  Engaged  Additional Comments: Was not in the room when the group first started. Did come in and helped with forming both lists  Paulino Rily

## 2021-11-27 NOTE — BHH Suicide Risk Assessment (Signed)
Suicide Risk Assessment  Admission Assessment    [facility] Admission Suicide Risk Assessment   Nursing information obtained from:  Patient Demographic factors:  Age 65 or older, Living alone Current Mental Status:  NA Loss Factors:  Loss of significant relationship, Decline in physical health Historical Factors:  Anniversary of important loss Risk Reduction Factors:  Positive social support  Total Time spent with patient: 1.5 hours Principal Problem: MDD (major depressive disorder), severe ([place]) Diagnosis:  Principal Problem:   MDD (major depressive disorder), severe (HCC) Active Problems:   Chronic fatigue   Anxiety  Subjective Data:  [name] [name] is a 65 yr old female who presents with worsening depression and anxiety with SI.  PPHx is significant for MDD, Recurrent, Severe, Panic Disorder with Agoraphobia, GAD, Dependent Personality Disorder.   She reports that she has been depressed for a long time.  She reports her symptoms have been significantly worse since her husband passed away 3 years ago.  She states that she has been seeing Dr. Clovis Pu since her husband was first diagnosed with cancer in 7 years ago.  She states that over this time he has tried her on about 13 different medications.  She states that at this point she is afraid to be in her house.  At this point she broke down for the first of many times during the interview crying and stated that she heard about a little girl being murdered by a [employer] driver on the news and that she had sent Christmas gifts to all of her grandchildren and so was now afraid they were getting murdered by the [place] driver delivering the gifts.  She states that prior to being admitted she did call Dr. Clovis Pu about her significant issues with sleep and that he had advised her to take 2 of the 50 mg trazodone's with her Ambien and if that did not work to try 3 of the trazodone's with her Ambien and if that did not work to try the Seroquel.  She reports that  350 mg trazodone's and the Ambien worked so she did not try the Seroquel until a few days prior to admission.  She states that she took the Ambien and had a very significant side effect to it-she stated that as she was driving up the road she saw the road going down and that as she was driving down the road she saw the road going up.  She reports that she is all alone with the death of her husband.  She reports that she is unable to stay in the homes of any of her kids and that they do not come visit her that much.  She reports that she just found out that one of her sons was doing Christmas at their house while they had been doing Christmas at her house for the last 27 years.  She again broke down into tears at this point.   Went over the medications that Dr. Clovis Pu has tried for her over the years.  She states the one medication that she knows had some positive effect was the imipramine but that it caused her to grind her teeth to the point where she wore down on her teeth.  She states that she now needs dental work done involving molds of her upper and lower jaw.  She states that she does not want to try antipsychotics because of the fear of weight gain.  She reports another significant setback recently was when Dr. Charlott Holler told her that  he did not think he had anything else to offer her and that he would recommend seeing a new psychiatrist.   She reports that she lives alone.  She reports no alcohol use.  She reports no tobacco use.  She reports no substance abuse.  She reports past psychiatric history of severe depression, grief, and anxiety disorder with agoraphobia.  She reports that she has never had a psych hospitalization before.  She reports a family history of her sister with depression and her cousin with depression and anxiety.     She reports that Viibryd she is currently on she does not think is working or helping at all.  Discussed my recommendation would be to continue her Topamax and to add  to that Thorazine.  Had a long conversation about risks and benefits of it.  She continued to cry and debate back-and-forth whether to take the medication or not.  She did say that she would take the imipramine because she now has a nightguard that her dentist may her that will prevent her from grinding her teeth.  I discussed that I will stop the Viibryd and restart the imipramine.  Eventually she agreed to take Thorazine.  Continued Clinical Symptoms:  Alcohol Use Disorder Identification Test Final Score (AUDIT): 6 The "Alcohol Use Disorders Identification Test", Guidelines for Use in Primary Care, Second Edition.  World Pharmacologist [facility]). Score between 0-7:  no or low risk or alcohol related problems. Score between 8-15:  moderate risk of alcohol related problems. Score between 16-19:  high risk of alcohol related problems. Score 20 or above:  warrants further diagnostic evaluation for alcohol dependence and treatment.   CLINICAL FACTORS:   Severe Anxiety and/or Agitation Depression:   Anhedonia Insomnia Severe More than one psychiatric diagnosis Unstable or Poor Therapeutic Relationship Previous Psychiatric Diagnoses and Treatments   Musculoskeletal: Strength & Muscle Tone: within normal limits Gait & Station: normal Patient leans: N/A  Psychiatric Specialty Exam:  Presentation  General Appearance: Appropriate for Environment; Casual (with blanket around shoulders)  Eye Contact:Good  Speech:Clear and Coherent; Normal Rate  Speech Volume:Normal  Handedness:No data recorded  Mood and Affect  Mood:Anxious; Depressed; Labile  Affect:Congruent; Depressed; Tearful   Thought Process  Thought Processes:Coherent  Descriptions of Associations:Intact  Orientation:Full (Time, Place and Person)  Thought Content:Logical  History of Schizophrenia/Schizoaffective disorder:No  Duration of Psychotic Symptoms:No data recorded Hallucinations:Hallucinations:  None  Ideas of Reference:None  Suicidal Thoughts:Suicidal Thoughts: Yes, Passive SI Passive Intent and/or Plan: Without Intent; Without Plan  Homicidal Thoughts:Homicidal Thoughts: No   Sensorium  Memory:Immediate Fair; Recent Fair  Judgment:Poor  Insight:Poor   Executive Functions  Concentration:Fair  Attention Span:Fair  [place] of Knowledge:Good  Language:Good   Psychomotor Activity  Psychomotor Activity:Psychomotor Activity: Normal   Assets  Assets:Resilience; Physical Health; Vocational/Educational   Sleep  Sleep:Sleep: Fair    Physical Exam: Physical Exam Vitals and nursing note reviewed.  Constitutional:      General: She is not in acute distress.    Appearance: Normal appearance. She is obese. She is not ill-appearing or toxic-appearing.  HENT:     Head: Normocephalic and atraumatic.  Pulmonary:     Effort: Pulmonary effort is normal.  Musculoskeletal:        General: Normal range of motion.  Neurological:     General: No focal deficit present.     Mental Status: She is alert.   Review of Systems  Respiratory:  Negative for cough and shortness of breath.  Cardiovascular:  Negative for chest pain.  Gastrointestinal:  Negative for abdominal pain, constipation, diarrhea, nausea and vomiting.  Neurological:  Negative for weakness and headaches.  Psychiatric/Behavioral:  Positive for depression and suicidal ideas (passive). Negative for hallucinations. The patient is nervous/anxious and has insomnia.   Blood pressure 123/82, pulse 65, temperature 98.4 F (36.9 C), temperature source Oral, resp. rate 16, height 5' (1.524 m), weight 84.4 kg, last menstrual period 12/12/2005, SpO2 97 %. Body mass index is 36.33 kg/m.   COGNITIVE FEATURES THAT CONTRIBUTE TO RISK:  Closed-mindedness, Polarized thinking, and Thought constriction (tunnel vision)    SUICIDE RISK:   Moderate:  Frequent suicidal ideation with limited intensity, and  duration, some specificity in terms of plans, no associated intent, good self-control, limited dysphoria/symptomatology, some risk factors present, and identifiable protective factors, including available and accessible social support.  PLAN OF CARE:  MDD, Recurrent, Severe w/out Psychosis   GAD: -Start Imipramine 25 mg QHS -Start Thorazine 10 mg BID -Continue Topamax 100 mg BID -Stop Viibryd -Continue Ativan 0.5 - 1 mg q6     Dependent Personality Disorder:     HTN: -Continue Amlodipine 5 mg daily -Continue HCTZ 12.5 daily     Hypothyroidism: -Continue Thyroid 90 mcg     Insomnia: -Continue Trazodone 50-100 mg PRN QHS -Decrease home Ambien to 5 mg QHS     -Continue Gabapentin 800 mg QID -Continue Protonix 40 mg BID -Continue Nuedexta 20-10 mg BID -Continue Claritin 10 mg daily -Continue Timoptic 0.5% 1 drop both eyes QHS     Hypokalemia: -Start Kdur 40 mEq for 2 doses -Recheck CMP tomorrow   I certify that inpatient services furnished can reasonably be expected to improve the patient's condition.   Briant Cedar, MD 11/27/2021, 1:06 PM

## 2021-11-27 NOTE — Group Note (Signed)
LCSW Group Therapy Note  Group Date: 11/27/2021 Start Time: 1000 End Time: 1100   Type of Therapy and Topic:  Group Therapy: Anger Cues and Responses  Participation Level:  Did Not Attend   Description of Group:   In this group, patients learned how to recognize the physical, cognitive, emotional, and behavioral responses they have to anger-provoking situations.  They identified a recent time they became angry and how they reacted.  They analyzed how their reaction was possibly beneficial and how it was possibly unhelpful.  The group discussed a variety of healthier coping skills that could help with such a situation in the future.  Focus was placed on how helpful it is to recognize the underlying emotions to our anger, because working on those can lead to a more permanent solution as well as our ability to focus on the important rather than the urgent.  Therapeutic Goals: Patients will remember their last incident of anger and how they felt emotionally and physically, what their thoughts were at the time, and how they behaved. Patients will identify how their behavior at that time worked for them, as well as how it worked against them. Patients will explore possible new behaviors to use in future anger situations. Patients will learn that anger itself is normal and cannot be eliminated, and that healthier reactions can assist with resolving conflict rather than worsening situations.  Summary of Patient Progress:  [name] [name] was invited to group, did not attend.  Therapeutic Modalities:   Cognitive Behavioral Therapy    Marquette Old 11/27/2021  2:53 PM

## 2021-11-27 NOTE — Progress Notes (Signed)
D) Pt received anxious, visible, participating in milieu, and in no acute distress. Pt A & O x4. Pt denies SI, HI, A/ V H, depression, anxiety and pain at this time. A) Pt encouraged to drink fluids. Pt encouraged to come to staff with needs. Pt encouraged to attend and participate in groups. Pt encouraged to set reachable goals. Provider notified to review home medication orders. R) Pt remained safe on unit, in no acute distress, will continue to assess.      11/26/21 1930  Psych Admission Type (Psych Patients Only)  Admission Status Voluntary  Psychosocial Assessment  Patient Complaints Anxiety;Insomnia  Eye Contact Fair  Facial Expression Anxious  Affect Anxious  Speech Logical/coherent  Interaction Assertive  Motor Activity Other (Comment) (unrremarkable)  Appearance/Hygiene Unremarkable  Behavior Characteristics Cooperative;Anxious  Mood Anxious;Preoccupied  Thought Process  Coherency WDL  Content WDL  Delusions None reported or observed  Perception WDL  Hallucination None reported or observed  Judgment Poor  Confusion None  Danger to Self  Current suicidal ideation? Passive  Self-Injurious Behavior No self-injurious ideation or behavior indicators observed or expressed   Agreement Not to Harm Self Yes  Description of Agreement verbal  Danger to Others  Danger to Others None reported or observed

## 2021-11-27 NOTE — [facility] Group Notes (Signed)
Goals Group 11/27/21    Group Focus: affirmation, clarity of thought, and goals/reality orientation Treatment Modality:  Psychoeducation Interventions utilized were assignment, group exercise, and support Purpose: To be able to understand and verbalize the reason for their admission to the hospital. To understand that the medication helps with their chemical imbalance but they also need to work on their choices in life. To be challenged to develop a list of 30 positives about themselves. Also introduce the concept that "feelings" are not reality.  Participation Level:  Active  Participation Quality:  Appropriate  Affect:  Appropriate  Cognitive:  Appropriate  Insight:  Improving  Engagement in Group:  Engaged  Additional Comments:  Pt rates her energy at a 10/10. Was interactive within the group. States that she is there for depression. Gave feedback to others and received feedback as well.  [name] [name]

## 2021-11-27 NOTE — H&P (Addendum)
Psychiatric Admission Assessment Adult  Patient Identification: [name] [name] MRN:  [id number] Date of Evaluation:  11/27/2021 Chief Complaint:  MDD (major depressive disorder), severe ([place]) [F32.2] Principal Diagnosis: MDD (major depressive disorder), severe ([place]) Diagnosis:  Principal Problem:   MDD (major depressive disorder), severe ([place]) Active Problems:   Chronic fatigue   Anxiety  History of Present Illness:  [name] [name] is a 65 yr old female who presents with worsening depression and anxiety with SI.  PPHx is significant for MDD, Recurrent, Severe, Panic Disorder with Agoraphobia, GAD, Dependent Personality Disorder.  [name] [name] reports that [name] [name] has been depressed for a long time.  [name] [name] reports her symptoms have been significantly worse since her husband passed away 3 years ago.  [name] [name] states that [name] [name] has been seeing Dr. Clovis Pu since her husband was first diagnosed with cancer in 7 years ago.  [name] [name] states that over this time he has tried her on about 13 different medications.  [name] [name] states that at this point [name] [name] is afraid to be in her house.  At this point [name] [name] broke down for the first of many times during the interview crying and stated that [name] [name] heard about a little girl being murdered by a [employer] driver on the news and that [name] [name] had sent Christmas gifts to all of her grandchildren and so was now afraid they were getting murdered by the [place] driver delivering the gifts.  [name] [name] states that prior to being admitted [name] [name] did call Dr. Clovis Pu about her significant issues with sleep and that he had advised her to take 2 of the 50 mg trazodone's with her Ambien and if that did not work to try 3 of the trazodone's with her Ambien and if that did not work to try the Seroquel.  [name] [name] reports that 350 mg trazodone's and the Ambien worked so [name] [name] did not try the Seroquel until a few days prior to admission.  [name] [name] states that [name] [name] took the Ambien and had a very significant side effect to it-[name] [name] stated that as [name] [name] was  driving up the road [name] [name] saw the road going down and that as [name] [name] was driving down the road [name] [name] saw the road going up.  [name] [name] reports that [name] [name] is all alone with the death of her husband.  [name] [name] reports that [name] [name] is unable to stay in the homes of any of her kids and that they do not come visit her that much.  [name] [name] reports that [name] [name] just found out that one of her sons was doing Christmas at their house while they had been doing Christmas at her house for the last 27 years.  [name] [name] again broke down into tears at this point.  Went over the medications that Dr. Clovis Pu has tried for her over the years.  [name] [name] states the one medication that [name] [name] knows had some positive effect was the imipramine but that it caused her to grind her teeth to the point where [name] [name] wore down on her teeth.  [name] [name] states that [name] [name] now needs dental work done involving molds of her upper and lower jaw.  [name] [name] states that [name] [name] does not want to try antipsychotics because of the fear of weight gain.  [name] [name] reports another significant setback recently was when Dr. Charlott Holler told her that he did not think he had anything else to offer her and that he would recommend seeing a new psychiatrist.  [name] [name] reports that [name] [name] lives alone.  [name] [name] reports no alcohol use.  [name] [name] reports no tobacco use.  [name] [name]  reports no substance abuse.  [name] [name] reports past psychiatric history of severe depression, grief, and anxiety disorder with agoraphobia.  [name] [name] reports that [name] [name] has never had a psych hospitalization before.  [name] [name] reports a family history of her sister with depression and her cousin with depression and anxiety.    [name] [name] reports that Viibryd [name] [name] is currently on [name] [name] does not think is working or helping at all.  Discussed my recommendation would be to continue her Topamax and to add to that Thorazine.  Had a long conversation about risks and benefits of it.  [name] [name] continued to cry and debate back-and-forth whether to take the medication or not.  [name] [name] did say that [name] [name] would take the imipramine because [name] [name]  now has a nightguard that her dentist may her that will prevent her from grinding her teeth.  I discussed that I will stop the Viibryd and restart the imipramine.  Eventually [name] [name] agreed to take Thorazine.  Associated Signs/Symptoms: Depression Symptoms:  depressed mood, anhedonia, insomnia, fatigue, feelings of worthlessness/guilt, hopelessness, suicidal thoughts without plan, anxiety, panic attacks, loss of energy/fatigue, disturbed sleep, Duration of Depression Symptoms: Greater than two weeks  (Hypo) Manic Symptoms:   Reports none Anxiety Symptoms:  Agoraphobia, Excessive Worry, Panic Symptoms, Psychotic Symptoms:   Reports none PTSD Symptoms: Negative Total Time spent with patient: 1.5 hours  Past Psychiatric History: MDD, Recurrent, Severe, Panic Disorder with Agoraphobia, GAD, Dependent Personality Disorder.  Is the patient at risk to self? Yes.    Has the patient been a risk to self in the past 6 months? No.  Has the patient been a risk to self within the distant past? No.  Is the patient a risk to others? No.  Has the patient been a risk to others in the past 6 months? No.  Has the patient been a risk to others within the distant past? No.   Prior Inpatient Therapy:  No Prior Outpatient Therapy:  Yes Dr. Clovis Pu  Alcohol Screening: 1. How often do you have a drink containing alcohol?: 2 to 3 times a week 2. How many drinks containing alcohol do you have on a typical day when you are drinking?: 3 or 4 3. How often do you have six or more drinks on one occasion?: Less than monthly AUDIT-C Score: 5 4. How often during the last year have you found that you were not able to stop drinking once you had started?: Never 5. How often during the last year have you failed to do what was normally expected from you because of drinking?: Never 6. How often during the last year have you needed a first drink in the morning to get yourself going after a heavy drinking session?:  Never 7. How often during the last year have you had a feeling of guilt of remorse after drinking?: Never 8. How often during the last year have you been unable to remember what happened the night before because you had been drinking?: Less than monthly 9. Have you or someone else been injured as a result of your drinking?: No 10. Has a relative or friend or a doctor or another health worker been concerned about your drinking or suggested you cut down?: No Alcohol Use Disorder Identification Test Final Score (AUDIT): 6 Substance Abuse History in the last 12 months:  No. 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 - no current med.   Glucose intolerance (impaired glucose tolerance)    on Metformin   H/O hiatal hernia    "sliding"   History of echocardiogram    Echo 1/16: EF 60-65, no RWMA, Gr 1 DD, mild LAE, trivial pericardial eff post to heart   History of echocardiogram    Echo 11/17: EF 55-60, no RWMA, Gr 1 DD, normal RV function.   History of palpitations    Holter 12/15: NSR, PACs   Hx of migraines    Hypothyroidism    Osteoarthritis    knees   Positional headache since 1997   if lies on left side    Past Surgical History:  Procedure Laterality Date   CHOLECYSTECTOMY  04/21/2011   CHONDROPLASTY  10/19/2012   Procedure:  CHONDROPLASTY;  Surgeon: Yvette Rack., MD;  Location: [facility];  Service: Orthopedics;  Laterality: Right;   COLONOSCOPY     KNEE ARTHROSCOPY  10/19/2012   Procedure: ARTHROSCOPY KNEE;  Surgeon: Yvette Rack., MD;  Location: [facility];  Service: Orthopedics;  Laterality: Right;   KNEE ARTHROSCOPY Right 02/01/2013   Procedure: RIGHT KNEE ARTHROSCOPY WITH MEDIAL MENISCECTOMY, DEBRIDEMENT CHONDROMALACIA PATELLA;  Surgeon: Yvette Rack., MD;  Location: [facility];  Service: Orthopedics;  Laterality: Right;  RIGHT KNEE ARTHROSCOPY WITH MEDIAL MENISCECTOMY, DEBRIDEMENT CHONDROMALACIA PATELLA   KNEE ARTHROSCOPY Left 03/15/2013   Procedure: LEFT KNEE ARTHROSCOPY WITH DEBRIDEMENT/SHAVING CHONDROPLASTY WITH MEDIAL MENISECTOMY;  Surgeon: Yvette Rack., MD;  Location: [facility];  Service: Orthopedics;  Laterality: Left;   KNEE ARTHROSCOPY WITH LATERAL MENISECTOMY  10/19/2012   Procedure: KNEE ARTHROSCOPY WITH LATERAL MENISECTOMY;  Surgeon: Yvette Rack., MD;  Location: [facility];  Service: Orthopedics;  Laterality: Right;   TONSILLECTOMY AND ADENOIDECTOMY     age 44   TUBAL LIGATION  1986   WISDOM TOOTH EXTRACTION     Family History:  Family History  Problem Relation Age of Onset   Lung cancer Father 23       dies age 18   Emphysema Father    COPD Brother 56       died age 87   Stroke Mother    Hypertension Sister    Thyroid disease Sister        Graves Disease   Breast cancer Neg Hx    Family Psychiatric  History: Sister - Depression Cousin - Depression and Anxiety Tobacco Screening:   Social History:  Social History   Substance and Sexual Activity  Alcohol Use Yes   Comment: occ     Social History   Substance and Sexual Activity  Drug Use No    Additional Social History: Marital status: Widowed Widowed, when?: 2016 Does patient have children?: Yes How many children?:  (4) How is patient's  relationship with their children?: States that children don't come around much. [name] [name] feels that her sons only come  around to cut the grass during the summer. Also, her daughter is busy with her new grand daughter....plus it's difficult for her daughter to come around as [name] [name] is also still grieving.    Pain Medications: SEE MAR Prescriptions: SEE MAR Over the Counter: SEE MAR History of alcohol / drug use?: No history of alcohol / drug abuse                    Allergies:   Allergies  Allergen Reactions   Levaquin [Levofloxacin] Other (See Comments)    MUSCLE PAIN   Nickel Rash   Lab Results:  Results for orders placed or performed during the hospital encounter of 11/26/21 (from the past 48 hour(s))  Resp Panel by RT-PCR (Flu A&B, Covid) Nasopharyngeal Swab     Status: None   Collection Time: 11/26/21  2:38 PM   Specimen: Nasopharyngeal Swab; Nasopharyngeal(NP) swabs in vial transport medium  Result Value Ref Range   SARS Coronavirus 2 by RT PCR NEGATIVE NEGATIVE    Comment: (NOTE) SARS-CoV-2 target nucleic acids are NOT DETECTED.  The SARS-CoV-2 RNA is generally detectable in upper respiratory specimens during the acute phase of infection. The lowest concentration of SARS-CoV-2 viral copies this assay can detect is 138 copies/mL. A negative result does not preclude SARS-Cov-2 infection and should not be used as the sole basis for treatment or other patient management decisions. A negative result may occur with  improper specimen collection/handling, submission of specimen other than nasopharyngeal swab, presence of viral mutation(s) within the areas targeted by this assay, and inadequate number of viral copies(<138 copies/mL). A negative result must be combined with clinical observations, patient history, and epidemiological information. The expected result is Negative.  Fact Sheet for Patients:  EntrepreneurPulse.com.au  Fact Sheet for Healthcare  Providers:  IncredibleEmployment.be  This test is no t yet approved or cleared by the [place] FDA and  has been authorized for detection and/or diagnosis of SARS-CoV-2 by FDA under an Emergency Use Authorization (EUA). This EUA will remain  in effect (meaning this test can be used) for the duration of the COVID-19 declaration under Section 564(b)(1) of the Act, 21 U.S.C.section 360bbb-3(b)(1), unless the authorization is terminated  or revoked sooner.       Influenza A by PCR NEGATIVE NEGATIVE   Influenza B by PCR NEGATIVE NEGATIVE    Comment: (NOTE) The Xpert Xpress SARS-CoV-2/FLU/RSV plus assay is intended as an aid in the diagnosis of influenza from Nasopharyngeal swab specimens and should not be used as a sole basis for treatment. Nasal washings and aspirates are unacceptable for Xpert Xpress SARS-CoV-2/FLU/RSV testing.  Fact Sheet for Patients: EntrepreneurPulse.com.au  Fact Sheet for Healthcare Providers: IncredibleEmployment.be  This test is not yet approved or cleared by the [place] FDA and has been authorized for detection and/or diagnosis of SARS-CoV-2 by FDA under an Emergency Use Authorization (EUA). This EUA will remain in effect (meaning this test can be used) for the duration of the COVID-19 declaration under Section 564(b)(1) of the Act, 21 U.S.C. section 360bbb-3(b)(1), unless the authorization is terminated or revoked.  Performed at [facility], [place] [address] [place], [place] [address]   Comprehensive metabolic panel     Status: Abnormal   Collection Time: 11/27/21  6:43 AM  Result Value Ref Range   Sodium 139 135 - 145 mmol/L   Potassium 3.0 (L) 3.5 - 5.1 mmol/L   Chloride 104 98 - 111 mmol/L   CO2 26 22 -  32 mmol/L   Glucose, Bld 130 (H) 70 - 99 mg/dL    Comment: Glucose reference range applies only to samples taken after fasting for at least 8 hours.   BUN 16 8 -  23 mg/dL   Creatinine, Ser 0.97 0.44 - 1.00 mg/dL   Calcium 9.0 8.9 - 10.3 mg/dL   Total Protein 6.9 6.5 - 8.1 g/dL   Albumin 3.9 3.5 - 5.0 g/dL   AST 37 15 - 41 U/L   ALT 54 (H) 0 - 44 U/L   Alkaline Phosphatase 85 38 - 126 U/L   Total Bilirubin 0.8 0.3 - 1.2 mg/dL   GFR, Estimated >60 >60 mL/min    Comment: (NOTE) Calculated using the CKD-EPI Creatinine Equation (2021)    Anion gap 9 5 - 15    Comment: Performed at [facility], [place] [address], [place], [place] [address]  Hemoglobin A1c     Status: None   Collection Time: 11/27/21  6:43 AM  Result Value Ref Range   Hgb A1c MFr Bld 5.6 4.8 - 5.6 %    Comment: (NOTE) Pre diabetes:          5.7%-6.4%  Diabetes:              >6.4%  Glycemic control for   <7.0% adults with diabetes    Mean Plasma Glucose 114.02 mg/dL    Comment: Performed at [facility] [address], [place], [place] [address]  Lipid panel     Status: Abnormal   Collection Time: 11/27/21  6:43 AM  Result Value Ref Range   Cholesterol 219 (H) 0 - 200 mg/dL   Triglycerides 77 <150 mg/dL   HDL 66 >40 mg/dL   Total CHOL/HDL Ratio 3.3 RATIO   VLDL 15 0 - 40 mg/dL   LDL Cholesterol 138 (H) 0 - 99 mg/dL    Comment:        Total Cholesterol/HDL:CHD Risk Coronary Heart Disease Risk Table                     Men   Women  1/2 Average Risk   3.4   3.3  Average Risk       5.0   4.4  2 X Average Risk   9.6   7.1  3 X Average Risk  23.4   11.0        Use the calculated Patient Ratio above and the CHD Risk Table to determine the patient's CHD Risk.        ATP III CLASSIFICATION (LDL):  <100     mg/dL   Optimal  100-129  mg/dL   Near or Above                    Optimal  130-159  mg/dL   Borderline  160-189  mg/dL   High  >190     mg/dL   Very High Performed at [facility] [address], [place], [place] [address]   TSH     Status: Abnormal   Collection Time: 11/27/21  6:43 AM  Result Value Ref Range   TSH  0.059 (L) 0.350 - 4.500 uIU/mL    Comment: Performed by a 3rd Generation assay with a functional sensitivity of <=0.01 uIU/mL. Performed at [facility], [place] [address], [place], [place] [address]     Blood Alcohol level:  No results found for: [facility]  Metabolic Disorder  Labs:  Lab Results  Component Value Date   HGBA1C 5.6 11/27/2021   MPG 114.02 11/27/2021   MPG 108 05/24/2021   No results found for: PROLACTIN Lab Results  Component Value Date   CHOL 219 (H) 11/27/2021   TRIG 77 11/27/2021   HDL 66 11/27/2021   CHOLHDL 3.3 11/27/2021   VLDL 15 11/27/2021   LDLCALC 138 (H) 11/27/2021   LDLCALC 117 (H) 05/24/2021    Current Medications: Current Facility-Administered Medications  Medication Dose Route Frequency Provider Last Rate Last Admin   amLODipine (NORVASC) tablet 5 mg  5 mg Oral Daily Bobbitt, Shalon E, NP   5 mg at 11/27/21 6387   chlorproMAZINE (THORAZINE) tablet 10 mg  10 mg Oral BID Briant Cedar, MD   10 mg at 11/27/21 1644   Dextromethorphan-quiNIDine (NUEDEXTA) 20-10 MG per capsule 1 capsule  1 capsule Oral BID Bobbitt, Shalon E, NP   1 capsule at 11/27/21 1315   gabapentin (NEURONTIN) capsule 800 mg  800 mg Oral QID Bobbitt, Shalon E, NP   800 mg at 11/27/21 1643   hydrochlorothiazide (HYDRODIURIL) tablet 12.5 mg  12.5 mg Oral Daily Bobbitt, Shalon E, NP   12.5 mg at 11/27/21 5643   imipramine (TOFRANIL) tablet 25 mg  25 mg Oral QHS Briant Cedar, MD       loratadine (CLARITIN) tablet 10 mg  10 mg Oral Daily Bobbitt, Shalon E, NP   10 mg at 11/27/21 0853   LORazepam (ATIVAN) tablet 0.5-1 mg  0.5-1 mg Oral Q6H PRN Briant Cedar, MD   0.5 mg at 11/27/21 1315   montelukast (SINGULAIR) tablet 10 mg  10 mg Oral Daily Massengill, Ovid Curd, MD   10 mg at 11/27/21 0857   pantoprazole (PROTONIX) EC tablet 40 mg  40 mg Oral BID Bobbitt, Shalon E, NP   40 mg at 11/27/21 1644   potassium chloride SA (KLOR-CON M) CR tablet 40 mEq  40 mEq  Oral BID Briant Cedar, MD       [START ON 11/28/2021] thyroid (ARMOUR) tablet 90 mg  90 mg Oral Q24H Cyani Kallstrom, Redgie Grayer, MD       timolol (TIMOPTIC) 0.5 % ophthalmic solution 1 drop  1 drop Both Eyes QHS Lacharles Altschuler, Redgie Grayer, MD       topiramate (TOPAMAX) tablet 100 mg  100 mg Oral BID Briant Cedar, MD       traZODone (DESYREL) tablet 50-100 mg  50-100 mg Oral QHS PRN Briant Cedar, MD       zolpidem (AMBIEN) tablet 5 mg  5 mg Oral QHS Bobbitt, Shalon E, NP   5 mg at 11/26/21 2239   PTA Medications: Medications Prior to Admission  Medication Sig Dispense Refill Last Dose   albuterol (PROAIR HFA) 108 (90 Base) MCG/ACT inhaler Inhale 2 puffs into the lungs every 6 (six) hours as needed for wheezing or shortness of breath. Use as needed only  if your can't catch your breath 18 g 3 unk   Cholecalciferol 50 MCG (2000 UT) TABS Take by mouth.   11/26/2021   Dextromethorphan-quiNIDine (NUEDEXTA) 20-10 MG capsule TAKE 1 CAPSULE BY MOUTH TWICE A DAY 60 capsule 2 11/26/2021   dicyclomine (BENTYL) 10 MG capsule Take 1 capsule by mouth 4 (four) times daily as needed for spasms (abdominal pain). 90 capsule 2 Past Week   gabapentin (NEURONTIN) 800 MG tablet Take 1 tablet (800 mg total) by mouth 4 (four) times daily. (Patient taking differently: Take  800 mg by mouth 4 (four) times daily. Pt only takes 2 tablets  daily  one tablet every morning and one tablet every  night) 120 tablet 1 11/26/2021   hydrochlorothiazide (HYDRODIURIL) 12.5 MG tablet Take 1 tablet by mouth once a day 30 tablet 6 11/26/2021   ipratropium-albuterol (DUONEB) 0.5-2.5 (3) MG/3ML SOLN Take 3 mLs by nebulization every 6 (six) hours as needed (breathing treatment).   unk   LORazepam (ATIVAN) 1 MG tablet TAKE 1 TABLET BY MOUTH EVERY 6 HOURS AS NEEDED 60 tablet 1 11/26/2021   montelukast (SINGULAIR) 10 MG tablet Take 1 tablet by mouth once a day 30 tablet 6 11/26/2021   nebivolol (BYSTOLIC) 10 MG tablet TAKE 1  TABLET BY MOUTH 2 TIMES DAILY 180 tablet 0 11/26/2021 at 0800,2000   pantoprazole (PROTONIX) 40 MG tablet Take 1 tablet by mouth 2 (two) times daily. 180 tablet 1 11/26/2021   thyroid (NP THYROID) 90 MG tablet Take 1 tablet (90 mg total) by mouth daily. 90 tablet 2 11/26/2021   timolol (TIMOPTIC) 0.5 % ophthalmic solution Instill 1 drop in right eye twice a day 5 mL 3 11/26/2021   topiramate (TOPAMAX) 100 MG tablet TAKE 1 TABLET BY MOUTH 2 TIMES DAILY (Patient taking differently: Take 100 mg by mouth 2 (two) times daily.) 180 tablet 0 11/26/2021   traZODone (DESYREL) 50 MG tablet Take 1 tablet (50 mg total) by mouth at bedtime. 90 tablet 1 11/26/2021   Vilazodone HCl (VIIBRYD) 40 MG TABS Take 1 tablet (40 mg total) by mouth daily. 30 tablet 1 11/26/2021   zolpidem (AMBIEN) 10 MG tablet TAKE 1/2 to 1 TABLET BY MOUTH AT BEDTIME AS NEEDED (Patient taking differently: Take 10 mg by mouth at bedtime.) 30 tablet 1 11/26/2021   amLODipine (NORVASC) 5 MG tablet Take 1 tablet by mouth daily 30 tablet 6  at 0800   Cholecalciferol (VITAMIN D) 125 MCG (5000 UT) CAPS Take 5,000 Units by mouth daily. (Patient not taking: Reported on 11/27/2021)   Not Taking   mometasone (NASONEX) 50 MCG/ACT nasal spray PLACE 2 SPRAYS INTO EACH NOSTRIL DAILY. (Patient not taking: Reported on 11/27/2021) 17 g 3 Not Taking   neomycin-polymyxin b-dexamethasone (MAXITROL) 3.5-10000-0.1 OINT Place 1 application into the right eye at bedtime.      Travoprost, BAK Free, (TRAVATAN Z) 0.004 % SOLN ophthalmic solution Instill 1 drop in each eye at bedtime. (Patient not taking: Reported on 11/27/2021) 7.5 mL 3 Not Taking    Musculoskeletal: Strength & Muscle Tone: within normal limits Gait & Station: normal Patient leans: N/A            Psychiatric Specialty Exam:  Presentation  General Appearance: Appropriate for Environment; Casual (with blanket around shoulders) Eye Contact:Good Speech:Clear and Coherent; Normal  Rate Speech Volume:Normal Handedness:No data recorded  Mood and Affect  Mood:Anxious; Depressed; Labile Affect:Congruent; Depressed; Tearful (became tearful multiple times throughout the interview)  Thought Process  Thought Processes:Coherent Duration of Psychotic Symptoms: No data recorded Past Diagnosis of Schizophrenia or Psychoactive disorder: No  Descriptions of Associations:Intact  Orientation:Full (Time, Place and Person)  Thought Content:Logical  Hallucinations:Hallucinations: None  Ideas of Reference:None  Suicidal Thoughts:Suicidal Thoughts: Yes, Passive SI Passive Intent and/or Plan: Without Intent; Without Plan  Homicidal Thoughts:Homicidal Thoughts: No   Sensorium  Memory:Immediate Fair; Recent Fair Judgment:Poor Insight:Poor  Executive Functions  Concentration:Fair Attention Span:[place] Language:Good  Psychomotor Activity  Psychomotor Activity:Psychomotor Activity: Normal  Assets  Assets:Resilience; Physical Health; Vocational/Educational  Sleep  Sleep:Sleep: Fair   Physical Exam: Physical Exam Vitals and nursing note reviewed.  Constitutional:      Appearance: Normal appearance. [name] [name] is obese.  HENT:     Head: Normocephalic and atraumatic.  Pulmonary:     Effort: Pulmonary effort is normal.  Musculoskeletal:        General: Normal range of motion.  Neurological:     General: No focal deficit present.     Mental Status: [name] [name] is alert.   Review of Systems  Respiratory:  Negative for cough and shortness of breath.   Cardiovascular:  Negative for chest pain.  Gastrointestinal:  Negative for abdominal pain, constipation, diarrhea, nausea and vomiting.  Neurological:  Negative for weakness and headaches.  Psychiatric/Behavioral:  Positive for depression and suicidal ideas (passive). Negative for hallucinations. The patient is nervous/anxious and has insomnia.   Blood pressure 102/61, pulse (!) 56,  temperature 98.4 F (36.9 C), temperature source Oral, resp. rate 16, height 5' (1.524 m), weight 84.4 kg, last menstrual period 12/12/2005, SpO2 98 %. Body mass index is 36.33 kg/m.  Treatment Plan Summary: Daily contact with patient to assess and evaluate symptoms and progress in treatment and Medication management   Refugio Mcconico is a 65 yr old female who presents with worsening depression and anxiety with SI.  PPHx is significant for MDD, Recurrent, Severe, Panic Disorder with Agoraphobia, GAD, Dependent Personality Disorder.   [name] [name] meets criteria for MDD, Recurrent, Severe based on the symptoms as reported above and documented in her chart along with the time frame that [name] [name] has been having these symptoms.  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  Since [name] [name] reports no effect of Viibryd we will stop this and restart Imipramine as [name] [name] has had success with it in the past according to patient and her chart.  [name] [name] will wear her night guard to prevent bruxism which was the main side effect causing her to stop it prior.   MDD, Recurrent, Severe w/out Psychosis   GAD: -Start Imipramine 25 mg QHS -Start Thorazine 10 mg BID -Continue Topamax 100 mg BID -Stop Viibryd -Continue Ativan 0.5 - 1 mg q6   Dependent Personality Disorder:   HTN: -Continue Amlodipine 5 mg daily -Continue HCTZ 12.5 daily   Hypothyroidism: -Continue Thyroid 90 mcg   Insomnia: -Continue Trazodone 50-100 mg PRN QHS -Decrease home Ambien to 5 mg QHS   -Continue Gabapentin 800 mg QID -Continue Protonix 40 mg BID -Continue Nuedexta 20-10 mg BID -Continue Claritin 10 mg daily -Continue Timoptic 0.5% 1 drop both eyes QHS   Hypokalemia: -Start Kdur 40 mEq for 2 doses -Recheck CMP tomorrow   Observation Level/Precautions:  15 minute checks  Laboratory:  CMP-  K: 3.0  ALT: 54  Lipid Panel: Chol:219   LDL: 138,  TSH: 0.059,  A1c:5.6  Psychotherapy:    Medications: Thorazine, imipramine, Topamax, Ativan  Consultations:    Discharge Concerns:    Estimated LOS:  Other:     Physician Treatment Plan for Primary Diagnosis: MDD (major depressive disorder), severe ([place]) Long Term Goal(s): Improvement in symptoms so as ready for discharge  Short Term Goals: Ability to identify changes in lifestyle to reduce recurrence of condition will improve, Ability to verbalize feelings will improve, Ability to disclose and discuss suicidal ideas, Ability to demonstrate self-control will improve, and Ability to identify and develop effective coping behaviors will improve  Physician Treatment Plan for Secondary  Diagnosis: Principal Problem:   MDD (major depressive disorder), severe ([facility]) Active Problems:   Chronic fatigue   Anxiety  Long Term Goal(s): Improvement in symptoms so as ready for discharge  Short Term Goals: Ability to identify changes in lifestyle to reduce recurrence of condition will improve, Ability to verbalize feelings will improve, Ability to disclose and discuss suicidal ideas, Ability to demonstrate self-control will improve, and Ability to identify and develop effective coping behaviors will improve  I certify that inpatient services furnished can reasonably be expected to improve the patient's condition.    Briant Cedar, MD 12/17/20226:37 PM

## 2021-11-27 NOTE — Progress Notes (Signed)
[facility] Group Notes:  (Nursing/MHT/Case Management/Adjunct)  Date:  11/27/2021  Time:  2015  Type of Therapy:   wrap up group  Participation Level:  Active  Participation Quality:  Appropriate, Attentive, Sharing, and Supportive  Affect:  Appropriate  Cognitive:  Alert  Insight:  Improving  Engagement in Group:  Engaged  Modes of Intervention:  Clarification, Education, and Socialization  Summary of Progress/Problems: Positive thinking and self-care were discussed.   Shellia Cleverly 11/27/2021, 11:25 PM

## 2021-11-27 NOTE — Progress Notes (Addendum)
°  D: Patient is alert and oriented x 4. Patient denies SI/HI/ AVH and pain, but endorses severe anxiety. Disposition is Calm and cooperative with appropriate affect. Verbally contracts for safety to this Probation officer.   A:  Pt was given scheduled medications. Pt was encourage to attend groups. Q 15 minute checks were done for safety. Pt was visualized interacting appropriately with others in the milieu. Pt was offered support and encouragement by this Probation officer. Pt is goal oriented and stated goal was reached for the day. Pt attended groups and interacts well with peers and staff. Pt is taking medication. Pt complied with scheduled medications. No signs of distress but has concerns about medication management.Pt remains receptive to treatment and safety maintained on unit.   R: Will continue to monitor and assess. Physician approved for pt to retrieve lipstick from belongings. Safety maintained during this shift.     11/27/21 1500  Psych Admission Type (Psych Patients Only)  Admission Status Voluntary  Psychosocial Assessment  Eye Contact Fair  Facial Expression Anxious  Affect Anxious  Speech Logical/coherent  Interaction Assertive  Motor Activity Other (Comment) (unrremarkable)  Appearance/Hygiene Unremarkable  Behavior Characteristics Anxious;Cooperative  Thought Process  Coherency WDL  Content WDL  Delusions None reported or observed  Perception WDL  Hallucination None reported or observed  Judgment Poor  Confusion None  Danger to Self  Current suicidal ideation? Passive  Self-Injurious Behavior No self-injurious ideation or behavior indicators observed or expressed   Agreement Not to Harm Self Yes  Description of Agreement verbal  Danger to Others  Danger to Others None reported or observed

## 2021-11-28 DIAGNOSIS — F439 Reaction to severe stress, unspecified: Secondary | ICD-10-CM | POA: Diagnosis present

## 2021-11-28 DIAGNOSIS — F607 Dependent personality disorder: Secondary | ICD-10-CM | POA: Diagnosis present

## 2021-11-28 DIAGNOSIS — F54 Psychological and behavioral factors associated with disorders or diseases classified elsewhere: Secondary | ICD-10-CM | POA: Diagnosis present

## 2021-11-28 MED ORDER — CHLORPROMAZINE HCL 25 MG PO TABS
25.0000 mg | ORAL_TABLET | Freq: Three times a day (TID) | ORAL | Status: DC
  Administered 2021-11-28 – 2021-11-29 (×2): 25 mg via ORAL
  Filled 2021-11-28 (×6): qty 1

## 2021-11-28 MED ORDER — TOPIRAMATE 25 MG PO TABS
125.0000 mg | ORAL_TABLET | Freq: Two times a day (BID) | ORAL | Status: DC
  Administered 2021-11-28 – 2021-11-30 (×4): 125 mg via ORAL
  Filled 2021-11-28 (×8): qty 1

## 2021-11-28 MED ORDER — DESVENLAFAXINE SUCCINATE ER 50 MG PO TB24
50.0000 mg | ORAL_TABLET | Freq: Every day | ORAL | Status: DC
  Administered 2021-11-29 – 2021-11-30 (×2): 50 mg via ORAL
  Filled 2021-11-28 (×4): qty 1

## 2021-11-28 NOTE — Progress Notes (Signed)
D:  Patient sleeps good, sleep medication helpful.  Good appetite, low energy level, good concentration.  Rated depression, anxiety and hopeless #8.  Denied withdrawals.  Denied SI.  Denied physical problems.  Denied physical pain.  Goal  "Fear when I am home."  Plans to attend all groups.  No discharge plans. A:  Medications administered per MD orders.  Emotional support and encouragement given patient. R:  Denied SI and HI, contracts for safety.  Denied A/V hallucinations.  Safety maintained with 15  minute checks.
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
## 2021-11-28 NOTE — Progress Notes (Signed)
Patient wanted to get her phone and a piece of paper to call her work and tell them she would not be at work a few days.   Nurse offered to take her and get the information.  But patient declined.  Stated another nurse would help her. Patient on the phone to her daughter.

## 2021-11-28 NOTE — Group Note (Signed)
Date:  11/28/2021 Time:  9:55 AM  Group Topic/Focus:  Orientation:   The focus of this group is to educate the patient on the purpose and policies of crisis stabilization and provide a format to answer questions about their admission.  The group details unit policies and expectations of patients while admitted.    Participation Level:  Minimal  Participation Quality:  Appropriate  Affect:  Appropriate  Cognitive:  Appropriate  Insight: Appropriate  Engagement in Group:  Limited  Modes of Intervention:  Discussion  Additional Comments:  Pt didn't want to talk about goals and was sobbing after group.  Garvin Fila 11/28/2021, 9:55 AM

## 2021-11-28 NOTE — [facility] Group Notes (Signed)
Adult Psychoeducational Group Not Date:  11/28/2021 Time:  0900-1045 Group Topic/Focus: PROGRESSIVE RELAXATION. A group where deep breathing is taught and tensing and relaxation muscle groups is used. Imagery is used as well.  Pts are asked to imagine 3 pillars that hold them up when they are not able to hold themselves up.  Participation Level:  Active  Participation Quality:  Appropriate  Affect:  Appropriate  Cognitive:  Oriented  Insight: Improving  Engagement in Group:  Engaged  Modes of Intervention:  Activity, Discussion, Education, and Support  Additional Comments:  Pt is unable to rate her energy. States her family, her children, her Church and God hold her up.  Paulino Rily

## 2021-11-28 NOTE — Progress Notes (Signed)
[facility] MD Progress Note  11/28/2021 4:59 PM [name] [name]  MRN:  [id number]  History of Present Illness: [name] [name] is a 65 yr old female who presents with worsening depression and anxiety with SI.  PPHx is significant for MDD, Recurrent, Severe, Panic Disorder with Agoraphobia, GAD, Dependent Personality Disorder  Case reviewed with treatment team. Per report, interviews with patient take excessive amounts of time and she struggles with decision making, is change resistant. She has been very tearful.  Subjective:  patient has severe treatment sabotage behaviors. She requested medications she has history of side effects to (dry mouth with imipramine) and when experiences this, is very upset, engages in catastrophizing and blaming. She also states that her eyes "look different", imagines that she will now lose her job because she looks 'drugged' and points to eyes (no apparent difference to this interviewer, no injection, pupils normal). She states that she thinks that all the other patients can see this, though she cannot describe how they are different. She does not think this is related to refusal of eye drops. She states that she only wants the specific drop she was recommended by her friend the pharmacist. Notified her that this is not in the pharmacy, but she can have it brought, but she doesn't want to call her family or friend for this either.    She states she will not take the imipramine, and I agree. She is willing to try Pristiq, with a more favorable side effect profile.  Instead we move to goal setting. She states that she cannot set any goals for her life because "I don't have any dreams". She denies any hobbies or activities she has every enjoyed other than being a wife and mother. She denies every wanting any goals other than they life she had with her spouse up until 3 years ago. This appears to be "baiting" for me to offer solutions or goals for her, but none are given and we move  on.   She asks for access to her purse for her work schedule to call and tell them she wont be in. She has it written down. 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 She is very rigid in doing things the way they were done years ago, as if preserved in time.     Principal Problem: MDD (major depressive disorder), severe ([place]) Diagnosis: Principal Problem:   MDD (major depressive disorder), severe (H[facility]) Active Problems:   Chronic fatigue   Anxiety   Dependent personality disorder ([place])   Personality traits affecting medical condition   Ineffective individual coping  Total Time spent with patient: 45 minutes  Past Psychiatric History: multiple medication trials: viibryd, imipramine, topamax, zoloft, rexulti, ativan, haldol, cymbalta, nudexta, gabapentin, trazodone, ambien, lexapro, paxil, buspar, zyprexa, abilify Recently dismissed from outpatient psychiatry with Dr. Clovis Pu Diagnosed with: MDD, Recurrent, Severe, Panic Disorder with Agoraphobia, GAD, Dependent Personality Disorder.  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 - no current med.   Glucose intolerance (impaired glucose tolerance)    on Metformin   H/O hiatal hernia    "sliding"   History of echocardiogram    Echo 1/16: EF 60-65, no RWMA, Gr 1 DD, mild LAE, trivial pericardial eff post to heart   History of echocardiogram    Echo 11/17: EF 55-60, no RWMA, Gr 1 DD, normal RV function.   History of palpitations    Holter 12/15: NSR, PACs   Hx of migraines    Hypothyroidism     Osteoarthritis    knees   Positional headache since 1997   if lies on left side    Past Surgical History:  Procedure Laterality Date   CHOLECYSTECTOMY  04/21/2011   CHONDROPLASTY  10/19/2012   Procedure: CHONDROPLASTY;  Surgeon: Yvette Rack., MD;  Location: [place];  Service: Orthopedics;  Laterality: Right;   COLONOSCOPY     KNEE ARTHROSCOPY  10/19/2012   Procedure: ARTHROSCOPY KNEE;  Surgeon: Yvette Rack., MD;  Location: [place];  Service: Orthopedics;  Laterality: Right;   KNEE ARTHROSCOPY Right 02/01/2013   Procedure: RIGHT KNEE ARTHROSCOPY WITH MEDIAL MENISCECTOMY, DEBRIDEMENT CHONDROMALACIA PATELLA;  Surgeon: Yvette Rack., MD;  Location: [place];  Service: Orthopedics;  Laterality: Right;  RIGHT KNEE ARTHROSCOPY WITH MEDIAL MENISCECTOMY, DEBRIDEMENT CHONDROMALACIA PATELLA   KNEE ARTHROSCOPY Left 03/15/2013   Procedure: LEFT KNEE ARTHROSCOPY WITH DEBRIDEMENT/SHAVING CHONDROPLASTY WITH MEDIAL MENISECTOMY;  Surgeon: Yvette Rack., MD;  Location: [place];  Service: Orthopedics;  Laterality: Left;   KNEE ARTHROSCOPY WITH LATERAL MENISECTOMY  10/19/2012   Procedure: KNEE ARTHROSCOPY WITH LATERAL MENISECTOMY;  Surgeon: Yvette Rack., MD;  Location: [place];  Service: Orthopedics;  Laterality: Right;   TONSILLECTOMY AND ADENOIDECTOMY     age 49   TUBAL LIGATION  1986   WISDOM TOOTH EXTRACTION     Family History:  Family History  Problem Relation Age of Onset   Lung cancer Father 85       dies age 26   Emphysema Father    COPD Brother 32       died age 61   Stroke Mother    Hypertension Sister    Thyroid disease Sister        Graves Disease   Breast cancer Neg Hx    Family Psychiatric  History: Sister - Depression Cousin - Depression and Anxiety Social History:  Social History   Substance and Sexual Activity  Alcohol Use Yes   Comment: occ     Social History   Substance and  Sexual Activity  Drug Use No    Social History   Socioeconomic History   Marital status: Widowed    Spouse name: Not on file   Number of children: Not on file   Years of education: Not on file   Highest education level: Not on file  Occupational History   Occupation: Land    Employer: [employer]  Tobacco Use   Smoking status: Never   Smokeless tobacco: Never  Vaping Use   Vaping Use: Never used  Substance and Sexual Activity   Alcohol use: Yes    Comment: occ   Drug use: No   Sexual activity: Yes    Partners: Male    Birth control/protection: Post-menopausal  Other Topics Concern   Not on file  Social History Narrative   Widowed late  2019 after 40+ years of marriage   3 sons one daughter   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   No/never tobacco no drug use 1 caffeinated beverage daily, occasional or rare glass of wine   Social Determinants of Health   Financial Resource Strain: Not on file  Food Insecurity: Not on file  Transportation Needs: Not on file  Physical Activity: Not on file  Stress: Not on file  Social Connections: Not on file   Additional Social History:  Marital status: Widowed Widowed, when?: 2016 Does patient have children?: Yes How many children?:  (4) How is patient's relationship with their children?: States that children don't come around much. She feels that her sons only come around to cut the grass during the summer. Also, her daughter is busy with her new grand daughter....plus it's difficult for her daughter to come around as she is also still grieving.   Pain Medications: SEE MAR Prescriptions: SEE MAR Over the Counter: SEE MAR History of alcohol / drug use?: No history of alcohol / drug abuse       Sleep: Fair  Appetite:  Fair  Current Medications: Current Facility-Administered Medications  Medication Dose Route Frequency Provider Last Rate Last Admin   amLODipine (NORVASC) tablet 5 mg  5  mg Oral Daily Bobbitt, Shalon E, NP   5 mg at 11/28/21 0831   chlorproMAZINE (THORAZINE) tablet 25 mg  25 mg Oral TID Maida Sale, MD       [START ON 11/29/2021] desvenlafaxine (PRISTIQ) 24 hr tablet 50 mg  50 mg Oral Daily Gwin Eagon, Jackie Plum, MD       Dextromethorphan-quiNIDine (NUEDEXTA) 20-10 MG per capsule 1 capsule  1 capsule Oral BID Bobbitt, Shalon E, NP   1 capsule at 11/28/21 0829   fluticasone (FLONASE) 50 MCG/ACT nasal spray 1 spray  1 spray Each Nare Daily Maida Sale, MD   1 spray at 11/28/21 0831   gabapentin (NEURONTIN) capsule 800 mg  800 mg Oral QID Bobbitt, Shalon E, NP   800 mg at 11/28/21 1656   hydrochlorothiazide (HYDRODIURIL) tablet 12.5 mg  12.5 mg Oral Daily Bobbitt, Shalon E, NP   12.5 mg at 11/28/21 0832   latanoprost (XALATAN) 0.005 % ophthalmic solution 1 drop  1 drop Both Eyes QHS Aahana Elza, Jackie Plum, MD       loratadine (CLARITIN) tablet 10 mg  10 mg Oral Daily Bobbitt, Shalon E, NP   10 mg at 11/28/21 0831   LORazepam (ATIVAN) tablet 0.5-1 mg  0.5-1 mg Oral Q6H PRN Briant Cedar, MD   1 mg at 11/27/21 2123   montelukast (SINGULAIR) tablet 10 mg  10 mg Oral Daily Massengill, Ovid Curd, MD   10 mg at 11/28/21 0830   pantoprazole (PROTONIX) EC tablet 40 mg  40 mg Oral BID Bobbitt, Shalon E, NP   40 mg at 11/28/21 8366   thyroid (ARMOUR) tablet 90 mg  90 mg Oral Q24H Briant Cedar, MD   90 mg at 11/28/21 2947   timolol (TIMOPTIC) 0.5 % ophthalmic solution 1 drop  1 drop Both Eyes QHS Briant Cedar, MD       topiramate (TOPAMAX) tablet 125 mg  125 mg Oral BID Maida Sale, MD       traZODone (DESYREL) tablet 50-100 mg  50-100 mg Oral QHS PRN Briant Cedar, MD   50 mg at 11/27/21 2123   zolpidem (AMBIEN) tablet 5 mg  5 mg Oral QHS Bobbitt, Shalon E, NP   5 mg at 11/27/21 2123    Lab Results:  Results for orders placed or performed during the hospital encounter of 11/26/21 (from the past 48 hour(s))   Comprehensive metabolic panel     Status: Abnormal   Collection Time: 11/27/21  6:43 AM  Result Value Ref Range   Sodium 139 135 - 145 mmol/L   Potassium 3.0 (L) 3.5 - 5.1 mmol/L   Chloride 104 98 - 111 mmol/L   CO2 26 22 - 32 mmol/L   Glucose, Bld 130 (H) 70 - 99 mg/dL    Comment: Glucose reference range applies only to samples taken after fasting for at least 8 hours.   BUN 16 8 - 23 mg/dL   Creatinine, Ser 0.97 0.44 - 1.00 mg/dL   Calcium 9.0 8.9 - 10.3 mg/dL   Total Protein 6.9 6.5 - 8.1 g/dL   Albumin 3.9 3.5 - 5.0 g/dL   AST 37 15 - 41 U/L   ALT 54 (H) 0 - 44 U/L   Alkaline Phosphatase 85 38 - 126 U/L   Total Bilirubin 0.8 0.3 - 1.2 mg/dL   GFR, Estimated >60 >60 mL/min    Comment: (NOTE) Calculated using the CKD-EPI Creatinine Equation (2021)    Anion gap 9 5 - 15    Comment: Performed at [facility], [place] [address], [place], [place] [address]  Hemoglobin A1c     Status: None   Collection Time: 11/27/21  6:43 AM  Result Value Ref Range   Hgb A1c MFr Bld 5.6 4.8 - 5.6 %    Comment: (NOTE) Pre diabetes:          5.7%-6.4%  Diabetes:              >6.4%  Glycemic control for   <7.0% adults with diabetes    Mean Plasma Glucose 114.02 mg/dL    Comment: Performed at [facility] [address], [place], [place] [address]  Lipid panel     Status: Abnormal   Collection Time: 11/27/21  6:43 AM  Result Value Ref Range   Cholesterol 219 (H) 0 - 200 mg/dL   Triglycerides 77 <150 mg/dL   HDL 66 >40 mg/dL   Total CHOL/HDL Ratio 3.3 RATIO   VLDL 15 0 - 40 mg/dL   LDL Cholesterol 138 (H) 0 - 99 mg/dL    Comment:        Total Cholesterol/HDL:CHD Risk Coronary Heart Disease Risk Table                     Men   Women  1/2 Average Risk   3.4   3.3  Average Risk       5.0   4.4  2 X Average Risk   9.6   7.1  3 X Average Risk  23.4   11.0        Use the calculated Patient Ratio above and the CHD Risk Table to determine the patient's CHD Risk.         ATP III CLASSIFICATION (LDL):  <100     mg/dL   Optimal  100-129  mg/dL   Near or Above                    Optimal  130-159  mg/dL   Borderline  160-189  mg/dL   High  >190     mg/dL  Very High Performed at [facility] [address] [place], [place] [address]   TSH     Status: Abnormal   Collection Time: 11/27/21  6:43 AM  Result Value Ref Range   TSH 0.059 (L) 0.350 - 4.500 uIU/mL    Comment: Performed by a 3rd Generation assay with a functional sensitivity of <=0.01 uIU/mL. Performed at [facility], [place] [address] [place], [place] [address]     Blood Alcohol level:  No results found for: [facility]  Metabolic Disorder Labs: Lab Results  Component Value Date   HGBA1C 5.6 11/27/2021   MPG 114.02 11/27/2021   MPG 108 05/24/2021   No results found for: PROLACTIN Lab Results  Component Value Date   CHOL 219 (H) 11/27/2021   TRIG 77 11/27/2021   HDL 66 11/27/2021   CHOLHDL 3.3 11/27/2021   VLDL 15 11/27/2021   LDLCALC 138 (H) 11/27/2021   LDLCALC 117 (H) 05/24/2021    Physical Findings: AIMS: Facial and Oral Movements Muscles of Facial Expression: None, normal Lips and Perioral Area: None, normal Jaw: None, normal Tongue: None, normal,Extremity Movements Upper (arms, wrists, hands, fingers): None, normal Lower (legs, knees, ankles, toes): None, normal, Trunk Movements Neck, shoulders, hips: None, normal, Overall Severity Severity of abnormal movements (highest score from questions above): None, normal Incapacitation due to abnormal movements: None, normal Patient's awareness of abnormal movements (rate only patient's report): No Awareness, Dental Status Current problems with teeth and/or dentures?: No Does patient usually wear dentures?: No  CIWA:    COWS:     Musculoskeletal: Strength & Muscle Tone: within normal limits Gait & Station: normal Patient leans: N/A  Psychiatric Specialty Exam:  Presentation  General  Appearance: Appropriate for Environment; Neat  Eye Contact:Fair  Speech:Clear and Coherent  Speech Volume:Normal  Handedness:Right   Mood and Affect  Mood:Anxious; Depressed; Dysphoric  Affect:Depressed   Thought Process  Thought Processes:Goal Directed  Descriptions of Associations:Intact  Orientation:Full (Time, Place and Person)  Thought Content:Rumination  History of Schizophrenia/Schizoaffective disorder:No  Duration of Psychotic Symptoms:No data recorded Hallucinations:Hallucinations: None  Ideas of Reference:None  Suicidal Thoughts:Suicidal Thoughts: No SI Passive Intent and/or Plan: Without Intent; Without Plan  Homicidal Thoughts:Homicidal Thoughts: No   Sensorium  Memory:Immediate Fair  Judgment:Poor  Insight:Poor   Executive Functions  Concentration:Fair  Attention Span:Fair  [place]  Language:Good   Psychomotor Activity  Psychomotor Activity:Psychomotor Activity: Normal   Assets  Assets:Housing; Transportation   Sleep  Sleep:Sleep: Fair    Physical Exam: Physical Exam ROS Blood pressure 135/80, pulse 61, temperature 98 F (36.7 C), temperature source Oral, resp. rate 17, height 5' (1.524 m), weight 84.4 kg, last menstrual period 12/12/2005, SpO2 96 %. Body mass index is 36.33 kg/m.   Treatment Plan Summary: Daily contact with patient to assess and evaluate symptoms and progress in treatment and Medication management  Safety and Monitoring --  Admission to inpatient psychiatric unit for safety, stabilization and treatment -- Daily contact with patient to assess and evaluate symptoms and progress in treatment -- Patient's case to be discussed in multi-disciplinary team meeting. -- Patient will be encouraged to participate in the therapeutic group milieu. -- Observation Level : q15 minute checks -- Vital signs:  q12 hours -- Precautions: suicide.  Plan  -Monitor Vitals. -Monitor for  thoughts of harm to self or others -Monitor for psychosis, disorganization or changes to cognition -Monitor for withdrawal symptoms. -Monitor for medication side effects.  Labs/Studies: N/A  Medications:  Mood/anxiety:  Group, milieu therapy, 1:1 evaluation with provider.  Thorazine 25mg  PO TID for anxiety, sedative properties Pristiq 50mg  PO daily for depression and anxiety, with ativan 0.5mg  PO four times daily PRN Ambien 5mg  PO QHS, Trazodone 100mg  PO QHS PRN sleep Topamax 125mg  PO BID for impulsivity  Dependent Personality disorder:  Avoid offering solutions for patient, engaging in 'hole in the bucket' arguments, splitting, and being the 'savior' to the patient Limit the patient's interview time to a reasonable time, and frequency, set clear boundaries and follow through Avoid frequent medication changes, multiple PRNs Encourage patient to set personal boundaries with others, advocate for her own needs with others  Medical: PRNs for pain, constipation, indigestion available.   Gabapentin 800mg  PO four times daily for chronic pain  Maida Sale, MD 11/28/2021, 4:59 PM

## 2021-11-28 NOTE — Group Note (Signed)
[facility] LCSW Group Therapy Note  11/28/2021  10:30am-11:30am  Type of Therapy and Topic:  Group Therapy:  Adding Supports Including Yourself  Participation Level:  Minimal   Description of Group:   Patients in this group were introduced to the concept that additional supports including self-support are an essential part of recovery.  Patients listed their current healthy and unhealthy supports, and discussed the difference between the two.   Several songs were played and a group discussion ensued in which patients stated they could relate to the songs which inspired them to realize they have be willing to help themselves in order to succeed, because other people cannot achieve sobriety or stability for them.  Parents were encouraged toward self-advocacy and self-support as part of their recovery.  They discussed their reactions to these songs' messages, which were positive and hopeful.  Before group ended, they identified the supports they believe they need to add to their lives to achieve their goals at discharge.   Therapeutic Goals: 1)  explain the difference between healthy and unhealthy supports and discuss what specific supports are currently in patients' lives 2)  demonstrate the importance of being a key part of one's own support system 3)  discuss the need for appropriate boundaries with supports 4)  elicit ideas from patients about supports that need to be added in order to achieve goals   Summary of Patient Progress:   The patient listed current healthy supports as her 4 adult children and 1 good friend and current unhealthy supports as herself.  The patient participated for only a few minutes, then was called out of the room for an assessment by another staff member.  Therapeutic Modalities:   Motivational Interviewing Activity  Maretta Los

## 2021-11-28 NOTE — Progress Notes (Signed)
Patient very sad today, standing at med window crying.  I just couldn't think of anything positive to say in group.  I'm afraid I am going to be the same when I go home.  Crying continues.  Could not hurt herself at [facility].  I am just so sad.  I feel like people are watching me.  It makes me afraid.   Nurse assured patient that she is safe at [facility].  Staff is here to help her not to hurt her.

## 2021-11-28 NOTE — Progress Notes (Signed)
The focus of this group is to help patients review their daily goal of treatment and discuss progress on daily workbooks. Pt attended the evening group session and responded to all discussion prompts from the [place]. Pt shared that today was a good day on the unit, the highlight of which was a visit from her youngest son during visitation.  Pt told that her goal for the coming week was to figure out her aftercare plans following discharge, which included finding a therapist to see on a regular basis.  Pt rated her day a 5 out of 10 and her affect was appropriate.

## 2021-11-28 NOTE — Progress Notes (Signed)
Another nurse allowed patient to retrieve note and phone to find phone number and work schedule.  She is scheduled to work tomorrow, Monday 12/19.

## 2021-11-28 NOTE — Progress Notes (Incomplete)
[facility] MD Progress Note  11/28/2021 7:06 AM [name] [name]  MRN:  [id number] Subjective:    [name] [name] is a 65 yr old female who presents with worsening depression and anxiety with SI.  PPHx is significant for MDD, Recurrent, Severe, Panic Disorder with Agoraphobia, GAD, Dependent Personality Disorder.   Case was discussed in the multidisciplinary team. MAR was reviewed and patient was compliant with medications.  She did require Ativan and Trazodone PRN medications.   Psychiatric Team made the following recommendations yesterday: -Start Imipramine 25 mg QHS -Start Thorazine 10 mg BID -Continue Topamax 100 mg BID -Stop Viibryd -Continue Trazodone 50-100 mg PRN QHS -Decrease home Ambien to 5 mg QHS    On interview today patient reports ***   Principal Problem: MDD (major depressive disorder), severe ([place]) Diagnosis: Principal Problem:   MDD (major depressive disorder), severe ([place]) Active Problems:   Chronic fatigue   Anxiety  Total Time spent with patient: {Time; 15 min - 8 hours:17441}  Past Psychiatric History: MDD, Recurrent, Severe, Panic Disorder with Agoraphobia, GAD, Dependent Personality Disorder.  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 - no current med.   Glucose intolerance (impaired glucose tolerance)    on Metformin   H/O hiatal hernia    "sliding"   History of echocardiogram    Echo 1/16: EF 60-65, no RWMA, Gr 1 DD, mild LAE, trivial pericardial eff post to heart   History of echocardiogram    Echo 11/17: EF 55-60, no RWMA, Gr 1 DD, normal RV function.   History of  palpitations    Holter 12/15: NSR, PACs   Hx of migraines    Hypothyroidism    Osteoarthritis    knees   Positional headache since 1997   if lies on left side    Past Surgical History:  Procedure Laterality Date   CHOLECYSTECTOMY  04/21/2011   CHONDROPLASTY  10/19/2012   Procedure: CHONDROPLASTY;  Surgeon: Yvette Rack., MD;  Location: [place];  Service: Orthopedics;  Laterality: Right;   COLONOSCOPY     KNEE ARTHROSCOPY  10/19/2012   Procedure: ARTHROSCOPY KNEE;  Surgeon: Yvette Rack., MD;  Location: [place];  Service: Orthopedics;  Laterality: Right;   KNEE ARTHROSCOPY Right 02/01/2013   Procedure: RIGHT KNEE ARTHROSCOPY WITH MEDIAL MENISCECTOMY, DEBRIDEMENT CHONDROMALACIA PATELLA;  Surgeon: Yvette Rack., MD;  Location: [place];  Service: Orthopedics;  Laterality: Right;  RIGHT KNEE ARTHROSCOPY WITH MEDIAL MENISCECTOMY, DEBRIDEMENT CHONDROMALACIA PATELLA   KNEE ARTHROSCOPY Left 03/15/2013   Procedure: LEFT KNEE ARTHROSCOPY WITH DEBRIDEMENT/SHAVING CHONDROPLASTY WITH MEDIAL MENISECTOMY;  Surgeon: Yvette Rack., MD;  Location: [place];  Service: Orthopedics;  Laterality: Left;   KNEE ARTHROSCOPY WITH LATERAL MENISECTOMY  10/19/2012   Procedure: KNEE ARTHROSCOPY WITH LATERAL MENISECTOMY;  Surgeon: Yvette Rack., MD;  Location: [place];  Service: Orthopedics;  Laterality: Right;   TONSILLECTOMY AND ADENOIDECTOMY     age 53   TUBAL LIGATION  1986   WISDOM TOOTH EXTRACTION     Family History:  Family History  Problem Relation Age of Onset   Lung cancer Father 4       dies age 35   Emphysema Father    COPD Brother 41       died age 14   Stroke Mother    Hypertension Sister    Thyroid disease Sister        Graves Disease   Breast cancer Neg Hx    Family Psychiatric  History: Sister - Depression Cousin - Depression and Anxiety Social History:  Social History   Substance and Sexual  Activity  Alcohol Use Yes   Comment: occ     Social History   Substance and Sexual Activity  Drug Use No    Social History   Socioeconomic History   Marital status: Widowed    Spouse name: Not on file   Number of children: Not on file   Years of education: Not on file   Highest education level: Not on file  Occupational History   Occupation: Land    Employer: [employer]  Tobacco Use   Smoking status: Never   Smokeless tobacco: Never  Vaping Use   Vaping Use: Never used  Substance and Sexual Activity   Alcohol use: Yes    Comment: occ   Drug use: No   Sexual activity: Yes    Partners: Male    Birth control/protection: Post-menopausal  Other Topics Concern   Not on file  Social History Narrative   Widowed late 2019 after 40+ years of marriage   3 sons one daughter   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   No/never tobacco no drug use 1 caffeinated beverage daily, occasional or rare glass of wine   Social Determinants of Health   Financial Resource Strain: Not on file  Food Insecurity: Not on file  Transportation Needs: Not on file  Physical Activity: Not on file  Stress: Not on file  Social Connections: Not on file   Additional Social History:    Pain Medications: SEE MAR Prescriptions: SEE MAR Over the Counter: SEE MAR History of alcohol / drug use?: No history of alcohol / drug abuse                    Sleep: {BHH GOOD/FAIR/POOR:22877}  Appetite:  {BHH GOOD/FAIR/POOR:22877}  Current Medications: Current Facility-Administered Medications  Medication Dose Route Frequency Provider Last Rate Last Admin   amLODipine (NORVASC) tablet 5 mg  5 mg Oral Daily Bobbitt, Shalon E, NP   5 mg at 11/27/21 6144   chlorproMAZINE (THORAZINE) tablet 10 mg  10 mg Oral BID Briant Cedar, MD   10 mg at 11/27/21 1644   Dextromethorphan-quiNIDine (NUEDEXTA) 20-10 MG per capsule 1 capsule  1 capsule Oral BID Bobbitt,  Shalon E, NP   1 capsule at 11/27/21 1315   fluticasone (FLONASE) 50 MCG/ACT nasal spray 1 spray  1 spray Each Nare Daily Hill, Jackie Plum, MD   1 spray at 11/27/21 1920   gabapentin (NEURONTIN) capsule 800 mg  800 mg Oral QID Bobbitt, Shalon E, NP   800 mg at 11/27/21 2123   hydrochlorothiazide (HYDRODIURIL) tablet 12.5 mg  12.5 mg Oral Daily Bobbitt, Shalon E, NP   12.5 mg at 11/27/21 0852   imipramine (TOFRANIL) tablet 25 mg  25 mg Oral QHS Briant Cedar, MD   25 mg  at 11/27/21 2123   latanoprost (XALATAN) 0.005 % ophthalmic solution 1 drop  1 drop Both Eyes QHS Hill, Jackie Plum, MD       loratadine (CLARITIN) tablet 10 mg  10 mg Oral Daily Bobbitt, Shalon E, NP   10 mg at 11/27/21 0853   LORazepam (ATIVAN) tablet 0.5-1 mg  0.5-1 mg Oral Q6H PRN Briant Cedar, MD   1 mg at 11/27/21 2123   montelukast (SINGULAIR) tablet 10 mg  10 mg Oral Daily Massengill, Ovid Curd, MD   10 mg at 11/27/21 0857   pantoprazole (PROTONIX) EC tablet 40 mg  40 mg Oral BID Bobbitt, Shalon E, NP   40 mg at 11/27/21 1644   potassium chloride SA (KLOR-CON M) CR tablet 40 mEq  40 mEq Oral BID Briant Cedar, MD   40 mEq at 11/27/21 2124   thyroid (ARMOUR) tablet 90 mg  90 mg Oral Q24H Briant Cedar, MD   90 mg at 11/28/21 3664   timolol (TIMOPTIC) 0.5 % ophthalmic solution 1 drop  1 drop Both Eyes QHS Briant Cedar, MD       topiramate (TOPAMAX) tablet 100 mg  100 mg Oral BID Briant Cedar, MD   100 mg at 11/27/21 1917   traZODone (DESYREL) tablet 50-100 mg  50-100 mg Oral QHS PRN Briant Cedar, MD   50 mg at 11/27/21 2123   zolpidem (AMBIEN) tablet 5 mg  5 mg Oral QHS Bobbitt, Shalon E, NP   5 mg at 11/27/21 2123    Lab Results:  Results for orders placed or performed during the hospital encounter of 11/26/21 (from the past 48 hour(s))  Resp Panel by RT-PCR (Flu A&B, Covid) Nasopharyngeal Swab     Status: None   Collection Time: 11/26/21  2:38 PM    Specimen: Nasopharyngeal Swab; Nasopharyngeal(NP) swabs in vial transport medium  Result Value Ref Range   SARS Coronavirus 2 by RT PCR NEGATIVE NEGATIVE    Comment: (NOTE) SARS-CoV-2 target nucleic acids are NOT DETECTED.  The SARS-CoV-2 RNA is generally detectable in upper respiratory specimens during the acute phase of infection. The lowest concentration of SARS-CoV-2 viral copies this assay can detect is 138 copies/mL. A negative result does not preclude SARS-Cov-2 infection and should not be used as the sole basis for treatment or other patient management decisions. A negative result may occur with  improper specimen collection/handling, submission of specimen other than nasopharyngeal swab, presence of viral mutation(s) within the areas targeted by this assay, and inadequate number of viral copies(<138 copies/mL). A negative result must be combined with clinical observations, patient history, and epidemiological information. The expected result is Negative.  Fact Sheet for Patients:  EntrepreneurPulse.com.au  Fact Sheet for Healthcare Providers:  IncredibleEmployment.be  This test is no t yet approved or cleared by the [place] FDA and  has been authorized for detection and/or diagnosis of SARS-CoV-2 by FDA under an Emergency Use Authorization (EUA). This EUA will remain  in effect (meaning this test can be used) for the duration of the COVID-19 declaration under Section 564(b)(1) of the Act, 21 U.S.C.section 360bbb-3(b)(1), unless the authorization is terminated  or revoked sooner.       Influenza A by PCR NEGATIVE NEGATIVE   Influenza B by PCR NEGATIVE NEGATIVE    Comment: (NOTE) The Xpert Xpress SARS-CoV-2/FLU/RSV plus assay is intended as an aid in the diagnosis of influenza from Nasopharyngeal swab specimens and should not be used as a sole basis  for treatment. Nasal washings and aspirates are unacceptable for Xpert Xpress  SARS-CoV-2/FLU/RSV testing.  Fact Sheet for Patients: EntrepreneurPulse.com.au  Fact Sheet for Healthcare Providers: IncredibleEmployment.be  This test is not yet approved or cleared by the [place] FDA and has been authorized for detection and/or diagnosis of SARS-CoV-2 by FDA under an Emergency Use Authorization (EUA). This EUA will remain in effect (meaning this test can be used) for the duration of the COVID-19 declaration under Section 564(b)(1) of the Act, 21 U.S.C. section 360bbb-3(b)(1), unless the authorization is terminated or revoked.  Performed at [facility], [place] [address] [place], [place] [address]   Comprehensive metabolic panel     Status: Abnormal   Collection Time: 11/27/21  6:43 AM  Result Value Ref Range   Sodium 139 135 - 145 mmol/L   Potassium 3.0 (L) 3.5 - 5.1 mmol/L   Chloride 104 98 - 111 mmol/L   CO2 26 22 - 32 mmol/L   Glucose, Bld 130 (H) 70 - 99 mg/dL    Comment: Glucose reference range applies only to samples taken after fasting for at least 8 hours.   BUN 16 8 - 23 mg/dL   Creatinine, Ser 0.97 0.44 - 1.00 mg/dL   Calcium 9.0 8.9 - 10.3 mg/dL   Total Protein 6.9 6.5 - 8.1 g/dL   Albumin 3.9 3.5 - 5.0 g/dL   AST 37 15 - 41 U/L   ALT 54 (H) 0 - 44 U/L   Alkaline Phosphatase 85 38 - 126 U/L   Total Bilirubin 0.8 0.3 - 1.2 mg/dL   GFR, Estimated >60 >60 mL/min    Comment: (NOTE) Calculated using the CKD-EPI Creatinine Equation (2021)    Anion gap 9 5 - 15    Comment: Performed at [facility], [place] [address] [place], [place] [address]  Hemoglobin A1c     Status: None   Collection Time: 11/27/21  6:43 AM  Result Value Ref Range   Hgb A1c MFr Bld 5.6 4.8 - 5.6 %    Comment: (NOTE) Pre diabetes:          5.7%-6.4%  Diabetes:              >6.4%  Glycemic control for   <7.0% adults with diabetes    Mean Plasma Glucose 114.02 mg/dL    Comment: Performed at  [place] [address] [place], [place] [address]  Lipid panel     Status: Abnormal   Collection Time: 11/27/21  6:43 AM  Result Value Ref Range   Cholesterol 219 (H) 0 - 200 mg/dL   Triglycerides 77 <150 mg/dL   HDL 66 >40 mg/dL   Total CHOL/HDL Ratio 3.3 RATIO   VLDL 15 0 - 40 mg/dL   LDL Cholesterol 138 (H) 0 - 99 mg/dL    Comment:        Total Cholesterol/HDL:CHD Risk Coronary Heart Disease Risk Table                     Men   Women  1/2 Average Risk   3.4   3.3  Average Risk       5.0   4.4  2 X Average Risk   9.6   7.1  3 X Average Risk  23.4   11.0        Use the calculated Patient Ratio above and the CHD Risk Table to determine the patient's CHD Risk.  ATP III CLASSIFICATION (LDL):  <100     mg/dL   Optimal  100-129  mg/dL   Near or Above                    Optimal  130-159  mg/dL   Borderline  160-189  mg/dL   High  >190     mg/dL   Very High Performed at [facility] [address] [place], [place] [address]   TSH     Status: Abnormal   Collection Time: 11/27/21  6:43 AM  Result Value Ref Range   TSH 0.059 (L) 0.350 - 4.500 uIU/mL    Comment: Performed by a 3rd Generation assay with a functional sensitivity of <=0.01 uIU/mL. Performed at [facility], [place] [address], [place], [place] [address]     Blood Alcohol level:  No results found for: [facility]  Metabolic Disorder Labs: Lab Results  Component Value Date   HGBA1C 5.6 11/27/2021   MPG 114.02 11/27/2021   MPG 108 05/24/2021   No results found for: PROLACTIN Lab Results  Component Value Date   CHOL 219 (H) 11/27/2021   TRIG 77 11/27/2021   HDL 66 11/27/2021   CHOLHDL 3.3 11/27/2021   VLDL 15 11/27/2021   LDLCALC 138 (H) 11/27/2021   LDLCALC 117 (H) 05/24/2021    Physical Findings: AIMS: Facial and Oral Movements Muscles of Facial Expression: None, normal Lips and Perioral Area: None, normal Jaw: None, normal Tongue: None,  normal,Extremity Movements Upper (arms, wrists, hands, fingers): None, normal Lower (legs, knees, ankles, toes): None, normal, Trunk Movements Neck, shoulders, hips: None, normal, Overall Severity Severity of abnormal movements (highest score from questions above): None, normal Incapacitation due to abnormal movements: None, normal Patient's awareness of abnormal movements (rate only patient's report): No Awareness, Dental Status Current problems with teeth and/or dentures?: No Does patient usually wear dentures?: No  CIWA:    COWS:     Musculoskeletal: Strength & Muscle Tone: {desc; muscle tone:32375} Gait & Station: {PE GAIT ED WGNF:62130} Patient leans: {Patient Leans:21022755}  Psychiatric Specialty Exam:  Presentation  General Appearance: Appropriate for Environment; Casual (with blanket around shoulders)  Eye Contact:Good  Speech:Clear and Coherent; Normal Rate  Speech Volume:Normal  Handedness:No data recorded  Mood and Affect  Mood:Anxious; Depressed; Labile  Affect:Congruent; Depressed; Tearful (became tearful multiple times throughout the interview)   Thought Process  Thought Processes:Coherent  Descriptions of Associations:Intact  Orientation:Full (Time, Place and Person)  Thought Content:Logical  History of Schizophrenia/Schizoaffective disorder:No  Duration of Psychotic Symptoms:No data recorded Hallucinations:Hallucinations: None  Ideas of Reference:None  Suicidal Thoughts:Suicidal Thoughts: Yes, Passive SI Passive Intent and/or Plan: Without Intent; Without Plan  Homicidal Thoughts:Homicidal Thoughts: No   Sensorium  Memory:Immediate Fair; Recent Fair  Judgment:Poor  Insight:Poor   Executive Functions  Concentration:Fair  Attention Span:Fair  [place] of Knowledge:Good  Language:Good   Psychomotor Activity  Psychomotor Activity:Psychomotor Activity: Normal   Assets  Assets:Resilience; Physical Health;  Vocational/Educational   Sleep  Sleep:Sleep: Fair    Physical Exam: Physical Exam ROS Blood pressure (!) 106/51, pulse 60, temperature 97.8 F (36.6 C), temperature source Oral, resp. rate 17, height 5' (1.524 m), weight 84.4 kg, last menstrual period 12/12/2005, SpO2 100 %. Body mass index is 36.33 kg/m.   Treatment Plan Summary: Daily contact with patient to assess and evaluate symptoms and progress in treatment and Medication management   Tiah Heckel is a 65 yr old female who presents with worsening  depression and anxiety with SI.  PPHx is significant for MDD, Recurrent, Severe, Panic Disorder with Agoraphobia, GAD, Dependent Personality Disorder.     ***     MDD, Recurrent, Severe w/out Psychosis   GAD: -Continue Imipramine 25 mg QHS -Continue Thorazine 10 mg BID -Continue Topamax 100 mg BID -Continue Ativan 0.5 - 1 mg q6     Dependent Personality Disorder:     HTN: -Continue Amlodipine 5 mg daily -Continue HCTZ 12.5 daily     Hypothyroidism: -Continue Thyroid 90 mcg     Insomnia: -Continue Trazodone 50-100 mg PRN QHS -Continue Ambien to 5 mg QHS     -Continue Gabapentin 800 mg QID -Continue Protonix 40 mg BID -Continue Nuedexta 20-10 mg BID -Continue Claritin 10 mg daily -Continue Timoptic 0.5% 1 drop both eyes QHS     Hypokalemia: -Start Kdur 40 mEq for 2 doses -Recheck CMP tomorrow   Briant Cedar, MD 11/28/2021, 7:06 AM

## 2021-11-29 ENCOUNTER — Other Ambulatory Visit (HOSPITAL_COMMUNITY): Payer: Self-pay

## 2021-11-29 ENCOUNTER — Encounter (HOSPITAL_COMMUNITY): Payer: Self-pay

## 2021-11-29 ENCOUNTER — Telehealth: Payer: Self-pay | Admitting: Psychiatry

## 2021-11-29 MED ORDER — CHLORPROMAZINE HCL 25 MG PO TABS
75.0000 mg | ORAL_TABLET | Freq: Two times a day (BID) | ORAL | Status: DC
  Administered 2021-11-30: 08:00:00 75 mg via ORAL
  Filled 2021-11-29 (×5): qty 3

## 2021-11-29 MED ORDER — FLUTICASONE PROPIONATE 50 MCG/ACT NA SUSP
1.0000 | Freq: Every day | NASAL | Status: DC | PRN
  Administered 2021-11-29: 23:00:00 1 via NASAL

## 2021-11-29 MED ORDER — ALBUTEROL SULFATE HFA 108 (90 BASE) MCG/ACT IN AERS
2.0000 | INHALATION_SPRAY | Freq: Four times a day (QID) | RESPIRATORY_TRACT | Status: DC | PRN
  Administered 2021-11-29 – 2021-11-30 (×3): 2 via RESPIRATORY_TRACT
  Filled 2021-11-29: qty 6.7

## 2021-11-29 MED ORDER — CHLORPROMAZINE HCL 50 MG PO TABS
50.0000 mg | ORAL_TABLET | Freq: Three times a day (TID) | ORAL | Status: AC
  Administered 2021-11-29 (×2): 50 mg via ORAL
  Filled 2021-11-29 (×2): qty 1

## 2021-11-29 NOTE — BHH Counselor (Addendum)
CSW spoke with the Pt who stated that she needed the phone numbers for her work and for [name] paperwork.  CSW provided the Pt with these phone numbers so she could call and inform her job of her absences and apply for her FMLA.  CSW will complete FMLA paperwork when it is received.

## 2021-11-29 NOTE — Progress Notes (Signed)
Psychoeducational Group Note  Date:  11/29/2021 Time:  2134  Group Topic/Focus:  Relapse Prevention Planning:   The focus of this group is to define relapse and discuss the need for planning to combat relapse.  Participation Level: Did Not Attend  Participation Quality:  Not Applicable  Affect:  Not Applicable  Cognitive:  Not Applicable  Insight:  Not Applicable  Engagement in Group: Not Applicable  Additional Comments:  The patient did not attend group this evening.   Archie Balboa S 11/29/2021, 9:34 PM

## 2021-11-29 NOTE — Progress Notes (Signed)
D:  Flois was flat, sad and depressed.  She did not attend evening wrap up group.  She was slow to respond to questions and taking her medications.  She denied SI/HI or AVH.  She requested to have her nasal spray this evening which order was changed to daily prn.  She voiced concern that she is not taking her Bystolic and encouraged her to talk with MD about this as well.  Will report to oncoming shift to follow up.  She will interrupt staff while working with other patients but she did step back and waited for staff to finish.   A:  1:1 for support and encouragement.  Medication given as ordered as well as prn medications.  Encouraged participation in group an unit activities.   R:  Calia remains safe on the unit.  We will continue to monitor the progress towards her goals.   11/29/21 2114  Psych Admission Type (Psych Patients Only)  Admission Status Voluntary  Psychosocial Assessment  Patient Complaints Anxiety;Depression;Insomnia;Loneliness  Eye Contact Fair  Facial Expression Anxious;Sad  Affect Anxious  Speech Logical/coherent;Soft  Interaction Assertive;Attention-seeking;Dominating;Manipulative  Motor Activity Slow  Appearance/Hygiene Unremarkable  Behavior Characteristics Cooperative;Anxious  Mood Depressed;Preoccupied  Thought Process  Coherency WDL  Content WDL  Delusions None reported or observed  Perception WDL  Hallucination None reported or observed  Judgment Poor  Confusion None  Danger to Self  Current suicidal ideation? Denies  Danger to Others  Danger to Others None reported or observed

## 2021-11-29 NOTE — BHH Counselor (Signed)
CSW spoke with [facility] who states that they spoke with [facility] and that [facility] is willing to [facility] the Pt after the doctor reviews her records.  The receptionist at [facility] states that she will be sending those records over today but that the doctor cannot review them until next week because he is out on Holiday leave until next week.  The receptionist states that Dr. Curt Jews will continue to see the Pt until she can be referred to another facility.  She states that the Pt's next appointment with Dr. Curt Jews is January 10th at 4:00pm.  The receptionist states that they will refer her to [facility] (If they accept her next week) or [facility].  CSW will inform the Pt of this information and place the date and time in the Pt's follow-up chart.

## 2021-11-29 NOTE — Progress Notes (Signed)
Patient has been up and active on the unit, attended group this evening and has voiced no complaints. Patient currently denies having pain, -si/hi/a/v hall. Support and encouragement offered, safety maintained on unit, will continue to monitor.  

## 2021-11-29 NOTE — Plan of Care (Signed)
Nurse discussed coping skills with patient.  

## 2021-11-29 NOTE — Progress Notes (Signed)
D:  Patient's self inventory sheet, patient has good sleep, sleep medication helpful.  Good appetite, normal energy level, good concentration.  Rated depression and anxiety 8, hopeless 7-8.  Denied withdrawals.  Denied SI.  Denied physical problems.  Denied physical pain.  Goal is meeting with SW.  Plans to attend groups, plans with SW.  Plans to talk with MD, pharmacy about medication.  No discharge plans. A:  Medications administered per MD orders.  Emotional support and encouragement given patient. R:  Denied SI and HI, contracts for safety.  Denied A/V hallucinations.  Safety maintained with 15 minute checks.

## 2021-11-29 NOTE — BHH Counselor (Signed)
CSW contacted Dr. Hilary Hertz office [facility]) and was informed that the Pt could go to [facility] in [place].  Dr. Hilary Hertz office had 3 other recommendations but they were to [facility] and to [facility] (which has no openings until January 2023). CSW contacted the [facility] outpatient in [place] and was informed that the Pt could not be referred there unless the Pt was a [facility].  CSW then attempted [facility] in [place] and was informed that the Pt was still in Dr. Carleene Overlie system so she could not be provided with another appointment.  CSW waited until Dr. Carleene Overlie office reopened from lunch and called back and spoke with the receptionist who stated that she is not sure why the appointment cannot be made but states that she will look into it and call [name] [name] to get the issue fixed.  CSW will attempt to call [facility] in [place] around 4:00pm to schedule an appointment for the Pt and to see if this issue has been resolved.

## 2021-11-29 NOTE — Group Note (Signed)
LCSW Group Therapy Note   Group Date: 11/29/2021 Start Time: 1300 End Time: 1400 Therapy Type: Group Therapy  Participation Level:  Active   Patients received a worksheet with an outline of 2 gingerbread men with a separation in the middle of the page. One sign designated what the pt sees about themselves and the other is what others see. Pts were asked to introduce themselves and share something they like about themself. Pts were then asked to draw, write or color how they view themselves as well as how they are viewed by others. CSW led discussion about the feelings and words associated with each side.   Patient Summary:   During introductions pt shared their name and stated they liked her friendliness about themselves. Pt was appropriate and participated in group discussion.    Mliss Fritz, LCSWA 11/29/2021  1:27 PM

## 2021-11-29 NOTE — Telephone Encounter (Signed)
[facility] contacted office on pt's D/C from there. Is aware Dr. Clovis Pu was trying to refer Ellina to another psych provider. They were given the referring providers names and Glenard Haring attempted to reach them all. Of note, Dr. De Nurse at [facility] has availibility in Feb but they need Miroslava's notes faxed over to review before accepting. [facility] has availability in the end of Jan. Discussed with Dr. Clovis Pu and he will see pt Jan 10, as scheduled and we will refer to either [facility] or [facility]. Notes faxed to [facility] at Dr. Evalina Field office today.

## 2021-11-29 NOTE — BH IP Treatment Plan (Signed)
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[place])   Personality traits affecting medical condition   Ineffective individual coping   Current Medications:  Current Facility-Administered Medications  Medication Dose Route Frequency Provider Last Rate Last Admin   amLODipine (NORVASC) tablet 5 mg  5 mg Oral Daily Bobbitt, Shalon E, NP   5 mg at 11/29/21 8144   chlorproMAZINE (THORAZINE) tablet 25 mg  25 mg Oral TID Maida Sale, MD   25 mg at 11/29/21 0817   desvenlafaxine (PRISTIQ) 24 hr tablet 50 mg  50 mg Oral Daily Hill, Jackie Plum, MD   50 mg at 11/29/21 0816   Dextromethorphan-quiNIDine (NUEDEXTA) 20-10 MG per capsule 1 capsule  1 capsule Oral BID Bobbitt, Shalon E, NP   1 capsule at 11/29/21 0832   fluticasone (FLONASE) 50 MCG/ACT nasal spray 1 spray  1 spray Each Nare Daily Maida Sale, MD   1 spray at 11/28/21 0831   gabapentin (NEURONTIN) capsule 800 mg  800 mg Oral QID Bobbitt, Shalon E, NP   800 mg at 11/29/21 0815   hydrochlorothiazide (HYDRODIURIL) tablet 12.5 mg  12.5 mg Oral Daily Bobbitt, Shalon E, NP   12.5 mg at 11/29/21 0816   latanoprost (XALATAN) 0.005 % ophthalmic solution 1 drop  1 drop Both Eyes QHS Hill, Jackie Plum, MD   1 drop at 11/28/21 2228   loratadine (CLARITIN) tablet 10 mg  10 mg Oral Daily Bobbitt, Shalon E, NP   10 mg at 11/29/21 0818   LORazepam (ATIVAN) tablet 0.5-1 mg  0.5-1 mg Oral Q6H PRN Briant Cedar, MD   0.5 mg at 11/29/21 0918   montelukast (SINGULAIR) tablet 10 mg  10 mg Oral Daily Massengill, Ovid Curd, MD   10 mg at 11/29/21 0815   pantoprazole (PROTONIX) EC tablet 40 mg  40 mg Oral BID Bobbitt,  Shalon E, NP   40 mg at 11/29/21 0815   thyroid (ARMOUR) tablet 90 mg  90 mg Oral Q24H Briant Cedar, MD   90 mg at 11/29/21 8185   timolol (TIMOPTIC) 0.5 % ophthalmic solution 1 drop  1 drop Both Eyes QHS Briant Cedar, MD       topiramate (TOPAMAX) tablet 125 mg  125 mg Oral BID Maida Sale, MD   125 mg at 11/29/21 0816   traZODone (DESYREL) tablet 50-100 mg  50-100 mg Oral QHS PRN Briant Cedar, MD   50 mg at 11/28/21 2208   zolpidem (AMBIEN) tablet 5 mg  5 mg Oral QHS Bobbitt, Shalon E, NP   5 mg at 11/28/21 2205   PTA Medications: Medications Prior to Admission  Medication Sig Dispense Refill Last Dose   albuterol (PROAIR HFA) 108 (90 Base) MCG/ACT inhaler Inhale 2 puffs into the lungs every 6 (six) hours as needed for wheezing or shortness of breath. Use as needed only  if your can't catch your breath 18 g 3 unk   Cholecalciferol 50 MCG (2000 UT) TABS Take by mouth.   11/26/2021   Dextromethorphan-quiNIDine (NUEDEXTA) 20-10 MG capsule TAKE 1 CAPSULE BY MOUTH TWICE A DAY 60 capsule 2 11/26/2021   dicyclomine (BENTYL) 10 MG capsule Take 1 capsule by mouth 4 (four) times daily as  needed for spasms (abdominal pain). 90 capsule 2 Past Week   gabapentin (NEURONTIN) 800 MG tablet Take 1 tablet (800 mg total) by mouth 4 (four) times daily. (Patient taking differently: Take 800 mg by mouth 4 (four) times daily. Pt only takes 2 tablets  daily  one tablet every morning and one tablet every  night) 120 tablet 1 11/26/2021   hydrochlorothiazide (HYDRODIURIL) 12.5 MG tablet Take 1 tablet by mouth once a day 30 tablet 6 11/26/2021   ipratropium-albuterol (DUONEB) 0.5-2.5 (3) MG/3ML SOLN Take 3 mLs by nebulization every 6 (six) hours as needed (breathing treatment).   unk   LORazepam (ATIVAN) 1 MG tablet TAKE 1 TABLET BY MOUTH EVERY 6 HOURS AS NEEDED 60 tablet 1 11/26/2021   montelukast (SINGULAIR) 10 MG tablet Take 1 tablet by mouth once a day 30 tablet 6 11/26/2021    nebivolol (BYSTOLIC) 10 MG tablet TAKE 1 TABLET BY MOUTH 2 TIMES DAILY 180 tablet 0 11/26/2021 at 0800,2000   pantoprazole (PROTONIX) 40 MG tablet Take 1 tablet by mouth 2 (two) times daily. 180 tablet 1 11/26/2021   thyroid (NP THYROID) 90 MG tablet Take 1 tablet (90 mg total) by mouth daily. 90 tablet 2 11/26/2021   timolol (TIMOPTIC) 0.5 % ophthalmic solution Instill 1 drop in right eye twice a day 5 mL 3 11/26/2021   topiramate (TOPAMAX) 100 MG tablet TAKE 1 TABLET BY MOUTH 2 TIMES DAILY (Patient taking differently: Take 100 mg by mouth 2 (two) times daily.) 180 tablet 0 11/26/2021   traZODone (DESYREL) 50 MG tablet Take 1 tablet (50 mg total) by mouth at bedtime. 90 tablet 1 11/26/2021   Vilazodone HCl (VIIBRYD) 40 MG TABS Take 1 tablet (40 mg total) by mouth daily. 30 tablet 1 11/26/2021   zolpidem (AMBIEN) 10 MG tablet TAKE 1/2 to 1 TABLET BY MOUTH AT BEDTIME AS NEEDED (Patient taking differently: Take 10 mg by mouth at bedtime.) 30 tablet 1 11/26/2021   amLODipine (NORVASC) 5 MG tablet Take 1 tablet by mouth daily 30 tablet 6  at 0800   Cholecalciferol (VITAMIN D) 125 MCG (5000 UT) CAPS Take 5,000 Units by mouth daily. (Patient not taking: Reported on 11/27/2021)   Not Taking   mometasone (NASONEX) 50 MCG/ACT nasal spray PLACE 2 SPRAYS INTO EACH NOSTRIL DAILY. (Patient not taking: Reported on 11/27/2021) 17 g 3 Not Taking   neomycin-polymyxin b-dexamethasone (MAXITROL) 3.5-10000-0.1 OINT Place 1 application into the right eye at bedtime.      Travoprost, BAK Free, (TRAVATAN Z) 0.004 % SOLN ophthalmic solution Instill 1 drop in each eye at bedtime. (Patient not taking: Reported on 11/27/2021) 7.5 mL 3 Not Taking    Patient Stressors: Other: loss of significant other    Patient Strengths: Average or above average intelligence  Communication skills  Supportive family/friends   Treatment Modalities: Medication Management, Group therapy, Case management,  1 to 1 session with clinician,  Psychoeducation, Recreational therapy.   Physician Treatment Plan for Primary Diagnosis: MDD (major depressive disorder), severe ([place]) Long Term Goal(s): Improvement in symptoms so as ready for discharge   Short Term Goals: Ability to identify changes in lifestyle to reduce recurrence of condition will improve Ability to verbalize feelings will improve Ability to disclose and discuss suicidal ideas Ability to demonstrate self-control will improve Ability to identify and develop effective coping behaviors will improve  Medication Management: Evaluate patient's response, side effects, and tolerance of medication regimen.  Therapeutic Interventions: 1 to 1 sessions, Unit Group sessions and Medication  administration.  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  Therapeutic Interventions: 1 to 1 sessions, Unit Group sessions and Medication administration.  Evaluation of Outcomes: Not Met   RN Treatment Plan for Primary Diagnosis: MDD (major depressive disorder), severe ([place]) Long Term Goal(s): Knowledge of disease and therapeutic regimen to maintain health will improve  Short Term Goals: Ability to remain free from injury  will improve, Ability to verbalize frustration and anger appropriately will improve, Ability to demonstrate self-control, Ability to participate in decision making will improve, Ability to verbalize feelings will improve, Ability to disclose and discuss suicidal ideas, Ability to identify and develop effective coping behaviors will improve, and Compliance with prescribed medications will improve  Medication Management: RN will administer medications as ordered by provider, will assess and evaluate patient's response and provide education to patient for prescribed medication. RN will report any adverse and/or side effects to prescribing provider.  Therapeutic Interventions: 1 on 1 counseling sessions, Psychoeducation, Medication administration, Evaluate responses to treatment, Monitor vital signs and CBGs as ordered, Perform/monitor CIWA, COWS, AIMS and Fall Risk screenings as ordered, Perform wound care treatments as ordered.  Evaluation of Outcomes: Not Met   LCSW Treatment Plan for Primary Diagnosis: MDD (major depressive disorder), severe ([place]) Long Term Goal(s): Safe transition to appropriate next level of care at discharge, Engage patient in therapeutic group addressing interpersonal concerns.  Short Term Goals: Engage patient in aftercare planning with referrals and resources, Increase social support, Increase ability to appropriately verbalize feelings, Increase emotional regulation, Facilitate acceptance of mental health diagnosis and concerns, and Increase skills for wellness and recovery  Therapeutic Interventions: Assess for all discharge needs, 1 to 1 time with Social worker, Explore available resources and support systems, Assess for adequacy in community support network, Educate family and significant other(s) on suicide prevention, Complete Psychosocial Assessment, Interpersonal group therapy.  Evaluation of Outcomes: Not Met   Progress in Treatment: Attending groups:  Yes. Participating in groups: Yes. Taking medication as prescribed: Yes. and No. Toleration medication: Yes. Family/Significant other contact made: No, will contact:  son Patient understands diagnosis: Yes. and No. Discussing patient identified problems/goals with staff: Yes. Medical problems stabilized or resolved: Yes. Denies suicidal/homicidal ideation: Yes. Issues/concerns per patient self-inventory: No.   New problem(s) identified: No, Describe:  none  New Short Term/Long Term Goal(s): medication stabilization, elimination of SI thoughts, development of comprehensive mental wellness plan.    Patient Goals:  "To have more one on one sessions"  Discharge Plan or Barriers: Patient recently admitted. CSW will continue to follow and assess for appropriate referrals and possible discharge planning.    Reason for Continuation of Hospitalization: Anxiety Depression Medication stabilization  Estimated Length of Stay: 3-5 days   Scribe for Treatment Team: Vassie Moselle, LCSW 11/29/2021 10:41 AM

## 2021-11-29 NOTE — Group Note (Signed)
Recreation Therapy Group Note   Group Topic:Stress Management  Group Date: 11/29/2021 Start Time: 0930 End Time: 0950 Facilitators: Victorino Sparrow, [place] Location: [address] Day[address]   Goal Area(s) Addresses:  Patient will identify positive stress management techniques. Patient will identify benefits of using stress management post d/c.  Group Description: LRT played a meditation to help patients relax and lessen anxiety.  Patients were to listen and follow along as meditation played to gain its full benefits.  Clinical Observations/Individualized Feedback: Stress management group did not occur due to goals group running over to 0950.   Plan: Continue to engage patient in RT group sessions 2-3x/week.   Victorino Sparrow, LRT,CTRS 11/29/2021 1:33 PM

## 2021-11-29 NOTE — Progress Notes (Signed)
[facility] MD Progress Note  11/29/2021 3:40 PM [name] [name]  MRN:  [id number] Subjective:   [name] [name] is a 65 yr old female who presents with worsening depression and anxiety with SI.  PPHx is significant for MDD, Recurrent, Severe, Panic Disorder with Agoraphobia, GAD, Dependent Personality Disorder.  Case was discussed in the multidisciplinary team. MAR was reviewed and patient was compliant with medications.  She did not require any PRN's for agitation.  Patient has been noted by RN to attempt to monopolize her time.  RN notes that this is worse during medication dispensing.     Psychiatric Team made the following recommendations yesterday:  Mood/anxiety:  Group, milieu therapy, 1:1 evaluation with provider.  Thorazine 25mg  PO TID for anxiety, sedative properties Pristiq 50mg  PO daily for depression and anxiety, with ativan 0.5mg  PO four times daily PRN Ambien 5mg  PO QHS, Trazodone 100mg  PO QHS PRN sleep Topamax 125mg  PO BID for impulsivity   2. Dependent Personality disorder:  Avoid offering solutions for patient, engaging in 'hole in the bucket' arguments, splitting, and being the 'savior' to the patient Limit the patient's interview time to a reasonable time, and frequency, set clear boundaries and follow through Avoid frequent medication changes, multiple PRNs Encourage patient to set personal boundaries with others, advocate for her own needs with others  3. Medical: PRNs for pain, constipation, indigestion available.   Gabapentin 800mg  PO four times daily for chronic pain  On assessment this a.m. patient is noted to be wandering around carrying a blanket wrapped up in her arms.  Patient's overall affect appears dysphoric and patient is almost beginning hypophonic tone when interacting with any staff members.  On assessment patient is initially preoccupied with a dry mouth and asking whether or not this could be secondary to either her imipramine or her Pristiq.  Patient  requires redirection from this towards her assessment.  Patient reports that her mood today is "not very depressed."  Patient reports that she slept well and spoke with her daughter last night.  This a.m.  Patient's presentation noted to be changes and she becomes more dysphoric and was tearful while talking about her daughter.  Patient reports that she feels significant sense of guilt because her daughter told her that she had a "rough night last night."  Patient reports that her daughter has a newborn child and she feels bad that she is not able to be there to help for her daughter care for this child.  Patient reports that she sometimes believes that her daughter wished she was dead rather than patient's current deceased husband.  Patient endorses that her daughter has never asked said this out loud or shown any significant signs.  Patient endorses that she feels that her children do not visit her despite the fact that they live within 30 minutes of her and do come by her house at times or she stops by theirs.  Patient reports that she has been having a difficult time since the death of her husband 3 years ago.  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  Patient reports that she normally talks to her friend when she is feeling upset and endorses that this is her coping skill.  Patient reports that she does not feel that other coping skills she has heard of help her.  Patient is able to recall the  following coping skills: Deep breathing, tensing and releasing muscles, and relaxation.  Patient denies SI, HI and AVH.  Patient reports that she does not think that she will be able to continue taking Thorazine 3 times a day when she is working.  Patient reports that she would like to have this changed to twice daily. 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 - no current med.   Glucose intolerance (impaired glucose tolerance)    on Metformin   H/O hiatal hernia    "sliding"   History of echocardiogram    Echo 1/16: EF 60-65, no RWMA, Gr 1 DD, mild LAE, trivial pericardial eff post to heart   History of echocardiogram    Echo 11/17: EF 55-60, no RWMA, Gr 1 DD, normal RV function.   History of palpitations    Holter 12/15: NSR, PACs   Hx of migraines    Hypothyroidism    Osteoarthritis    knees   Positional headache since 1997   if lies on left side    Past Surgical History:  Procedure Laterality Date   CHOLECYSTECTOMY  04/21/2011   CHONDROPLASTY  10/19/2012   Procedure: CHONDROPLASTY;  Surgeon: Yvette Rack., MD;  Location: [place];  Service: Orthopedics;  Laterality: Right;   COLONOSCOPY     KNEE ARTHROSCOPY  10/19/2012   Procedure: ARTHROSCOPY KNEE;  Surgeon: Yvette Rack., MD;  Location: [place];  Service: Orthopedics;  Laterality: Right;   KNEE ARTHROSCOPY Right 02/01/2013   Procedure: RIGHT KNEE ARTHROSCOPY WITH MEDIAL MENISCECTOMY, DEBRIDEMENT CHONDROMALACIA PATELLA;  Surgeon: Yvette Rack., MD;  Location: [place];  Service: Orthopedics;   Laterality: Right;  RIGHT KNEE ARTHROSCOPY WITH MEDIAL MENISCECTOMY, DEBRIDEMENT CHONDROMALACIA PATELLA   KNEE ARTHROSCOPY Left 03/15/2013   Procedure: LEFT KNEE ARTHROSCOPY WITH DEBRIDEMENT/SHAVING CHONDROPLASTY WITH MEDIAL MENISECTOMY;  Surgeon: Yvette Rack., MD;  Location: [place];  Service: Orthopedics;  Laterality: Left;   KNEE ARTHROSCOPY WITH LATERAL MENISECTOMY  10/19/2012   Procedure: KNEE ARTHROSCOPY WITH LATERAL MENISECTOMY;  Surgeon: Yvette Rack., MD;  Location: [place];  Service: Orthopedics;  Laterality: Right;   TONSILLECTOMY AND ADENOIDECTOMY     age 77   TUBAL LIGATION  1986   WISDOM TOOTH EXTRACTION     Family History:  Family History  Problem Relation Age of Onset   Lung cancer Father 57       dies age 8   Emphysema Father    COPD Brother 32       died age 43   Stroke Mother    Hypertension Sister  Thyroid disease Sister        Graves Disease   Breast cancer Neg Hx    Family Psychiatric  History: See H&P Social History:  Social History   Substance and Sexual Activity  Alcohol Use Yes   Comment: occ     Social History   Substance and Sexual Activity  Drug Use No    Social History   Socioeconomic History   Marital status: Widowed    Spouse name: Not on file   Number of children: Not on file   Years of education: Not on file   Highest education level: Not on file  Occupational History   Occupation: Land    Employer: [employer]  Tobacco Use   Smoking status: Never   Smokeless tobacco: Never  Vaping Use   Vaping Use: Never used  Substance and Sexual Activity   Alcohol use: Yes    Comment: occ   Drug use: No   Sexual activity: Yes    Partners: Male    Birth control/protection: Post-menopausal  Other Topics Concern   Not on file  Social History Narrative   Widowed late 2019 after 40+ years of marriage   3 sons one daughter   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    No/never tobacco no drug use 1 caffeinated beverage daily, occasional or rare glass of wine   Social Determinants of Health   Financial Resource Strain: Not on file  Food Insecurity: Not on file  Transportation Needs: Not on file  Physical Activity: Not on file  Stress: Not on file  Social Connections: Not on file   Additional Social History:    Pain Medications: SEE MAR Prescriptions: SEE MAR Over the Counter: SEE MAR History of alcohol / drug use?: No history of alcohol / drug abuse                    Sleep: Good  Appetite:  Good  Current Medications: Current Facility-Administered Medications  Medication Dose Route Frequency Provider Last Rate Last Admin   albuterol (VENTOLIN HFA) 108 (90 Base) MCG/ACT inhaler 2 puff  2 puff Inhalation Q6H PRN Hill, Jackie Plum, MD       amLODipine (NORVASC) tablet 5 mg  5 mg Oral Daily Bobbitt, Shalon E, NP   5 mg at 11/29/21 0817   chlorproMAZINE (THORAZINE) tablet 50 mg  50 mg Oral TID Damita Dunnings B, MD   50 mg at 11/29/21 1315   [START ON 11/30/2021] chlorproMAZINE (THORAZINE) tablet 75 mg  75 mg Oral BID Damita Dunnings B, MD       desvenlafaxine (PRISTIQ) 24 hr tablet 50 mg  50 mg Oral Daily Hill, Jackie Plum, MD   50 mg at 11/29/21 0816   Dextromethorphan-quiNIDine (NUEDEXTA) 20-10 MG per capsule 1 capsule  1 capsule Oral BID Bobbitt, Shalon E, NP   1 capsule at 11/29/21 0832   fluticasone (FLONASE) 50 MCG/ACT nasal spray 1 spray  1 spray Each Nare Daily Maida Sale, MD   1 spray at 11/28/21 0831   gabapentin (NEURONTIN) capsule 800 mg  800 mg Oral QID Bobbitt, Shalon E, NP   800 mg at 11/29/21 1300   hydrochlorothiazide (HYDRODIURIL) tablet 12.5 mg  12.5 mg Oral Daily Bobbitt, Shalon E, NP   12.5 mg at 11/29/21 0816   latanoprost (XALATAN) 0.005 % ophthalmic solution 1 drop  1 drop Both Eyes QHS Hill, Jackie Plum, MD  1 drop at 11/28/21 2228   loratadine (CLARITIN) tablet 10 mg  10 mg Oral Daily Bobbitt,  Shalon E, NP   10 mg at 11/29/21 0818   LORazepam (ATIVAN) tablet 0.5-1 mg  0.5-1 mg Oral Q6H PRN Briant Cedar, MD   0.5 mg at 11/29/21 0918   montelukast (SINGULAIR) tablet 10 mg  10 mg Oral Daily Massengill, Ovid Curd, MD   10 mg at 11/29/21 0815   pantoprazole (PROTONIX) EC tablet 40 mg  40 mg Oral BID Bobbitt, Shalon E, NP   40 mg at 11/29/21 0815   thyroid (ARMOUR) tablet 90 mg  90 mg Oral Q24H Briant Cedar, MD   90 mg at 11/29/21 3151   timolol (TIMOPTIC) 0.5 % ophthalmic solution 1 drop  1 drop Both Eyes QHS Briant Cedar, MD       topiramate (TOPAMAX) tablet 125 mg  125 mg Oral BID Maida Sale, MD   125 mg at 11/29/21 0816   traZODone (DESYREL) tablet 50-100 mg  50-100 mg Oral QHS PRN Briant Cedar, MD   50 mg at 11/28/21 2208   zolpidem (AMBIEN) tablet 5 mg  5 mg Oral QHS Bobbitt, Shalon E, NP   5 mg at 11/28/21 2205    Lab Results: No results found for this or any previous visit (from the past 48 hour(s)).  Blood Alcohol level:  No results found for: [facility]  Metabolic Disorder Labs: Lab Results  Component Value Date   HGBA1C 5.6 11/27/2021   MPG 114.02 11/27/2021   MPG 108 05/24/2021   No results found for: PROLACTIN Lab Results  Component Value Date   CHOL 219 (H) 11/27/2021   TRIG 77 11/27/2021   HDL 66 11/27/2021   CHOLHDL 3.3 11/27/2021   VLDL 15 11/27/2021   LDLCALC 138 (H) 11/27/2021   LDLCALC 117 (H) 05/24/2021    Physical Findings: AIMS: Facial and Oral Movements Muscles of Facial Expression: None, normal Lips and Perioral Area: None, normal Jaw: None, normal Tongue: None, normal,Extremity Movements Upper (arms, wrists, hands, fingers): None, normal Lower (legs, knees, ankles, toes): None, normal, Trunk Movements Neck, shoulders, hips: None, normal, Overall Severity Severity of abnormal movements (highest score from questions above): None, normal Incapacitation due to abnormal movements: None, normal Patient's  awareness of abnormal movements (rate only patient's report): No Awareness, Dental Status Current problems with teeth and/or dentures?: No Does patient usually wear dentures?: No  CIWA:    COWS:     Musculoskeletal: Strength & Muscle Tone: within normal limits Gait & Station: normal Patient leans: N/A  Psychiatric Specialty Exam:  Presentation  General Appearance: Casual (walking around carrying a blanket)  Eye Contact:Good  Speech:Clear and Coherent  Speech Volume:Decreased (almost hypophonic)  Handedness:Right   Mood and Affect  Mood:Dysphoric; Anxious  Affect:Congruent; Depressed   Thought Process  Thought Processes:Linear  Descriptions of Associations:Circumstantial  Orientation:Full (Time, Place and Person)  Thought Content:Perseveration  History of Schizophrenia/Schizoaffective disorder:No  Duration of Psychotic Symptoms:No data recorded Hallucinations:Hallucinations: None  Ideas of Reference:None  Suicidal Thoughts:Suicidal Thoughts: No  Homicidal Thoughts:Homicidal Thoughts: No   Sensorium  Memory:Immediate Good; Recent Good  Judgment:Impaired  Insight:None   Executive Functions  Concentration:Good  Attention Span:Good  [place] of Knowledge:Good  Language:Good   Psychomotor Activity  Psychomotor Activity:Psychomotor Activity: Normal   Assets  Assets:Housing   Sleep  Sleep:Sleep: Good    Physical Exam: Physical Exam HENT:     Head: Normocephalic and atraumatic.  Eyes:  Extraocular Movements: Extraocular movements intact.  Neurological:     Mental Status: She is alert and oriented to person, place, and time.   Review of Systems  Psychiatric/Behavioral:  Negative for hallucinations and suicidal ideas.   Blood pressure 126/81, pulse 72, temperature 98.1 F (36.7 C), temperature source Oral, resp. rate 18, height 5' (1.524 m), weight 84.4 kg, last menstrual period 12/12/2005, SpO2 98 %. Body mass index is  36.33 kg/m.   Treatment Plan Summary: Daily contact with patient to assess and evaluate symptoms and progress in treatment and Medication management [name] [name] is a 65 year old with a past psychiatric history of MDD, panic disorder with agoraphobia, and dependent personality disorder.  Patient's dependent personality disorder seems to be making her refractory to most medication interventions.  Patient was noted to endorse that she did not feel she was giving excuses (but was giving excuses) when she and provider discussed coping skills.  Patient is noted to be preoccupied with things beyond her control or that have minimal impact on her care.  Patient requires constant redirecting in order to address her reason for hospitalization.  Patient has been noted to monopolize RNs time with aimless questions.  Patient is also noted multiple times on assessment today and on previous assessments to seek advice for fairly basic decisions.  Safety and Monitoring --  Admission to inpatient psychiatric unit for safety, stabilization and treatment -- Daily contact with patient to assess and evaluate symptoms and progress in treatment -- Patient's case to be discussed in multi-disciplinary team meeting. -- Patient will be encouraged to participate in the therapeutic group milieu. -- Observation Level : q15 minute checks -- Vital signs:  q12 hours -- Precautions: suicide.  Plan  -Monitor Vitals. -Monitor for thoughts of harm to self or others -Monitor for psychosis, disorganization or changes to cognition -Monitor for withdrawal symptoms. -Monitor for medication side effects.   Labs/Studies: N/A   Medications:   Mood/anxiety:  Group, milieu therapy, 1:1 evaluation with provider.  Increase Thorazine to 75 mg p.o. twice daily for anxiety, sedative properties Pristiq 50mg  PO daily for depression and anxiety, with ativan 0.5mg  PO four times daily PRN Ambien 5mg  PO QHS, Trazodone 100mg  PO QHS PRN  sleep Topamax 125mg  PO BID for impulsivity   Dependent Personality disorder:  Avoid offering solutions for patient, engaging in 'hole in the bucket' arguments, splitting, and being the 'savior' to the patient Limit the patient's interview time to a reasonable time, and frequency, set clear boundaries and follow through Avoid frequent medication changes, multiple PRNs Encourage patient to set personal boundaries with others, advocate for her own needs with others  Medical: PRNs for pain, constipation, indigestion available.   Gabapentin 800mg  PO four times daily for chronic pain    PGY-2 Freida Busman, MD 11/29/2021, 3:40 PM

## 2021-11-29 NOTE — [facility] Group Notes (Signed)
Adult Psychoeducational Group Note  Date:  11/29/2021 Time:  10:35 AM  Group Topic/Focus:  Goals Group:   The focus of this group is to help patients establish daily goals to achieve during treatment and discuss how the patient can incorporate goal setting into their daily lives to aide in recovery.  Participation Level:  Active  Participation Quality:  Appropriate  Affect:  Appropriate  Cognitive:  Appropriate  Insight: Appropriate  Engagement in Group:  Engaged  Modes of Intervention:  Discussion  Additional Comments:   Patient attended morning orientation group and participated.  Erie Sica W Ellisyn Icenhower 49/17/9150, 10:35 AM

## 2021-11-29 NOTE — Telephone Encounter (Signed)
Error. Pt was seen today

## 2021-11-29 NOTE — BHH Counselor (Addendum)
CSW spoke with the [name] [name] who states that she has been seen at [facility] for medication management with Dr. Curt Jews.  She states that Dr. Curt Jews has asked her to find another Psychiatrist.  She states that she commutes to [place] Monday through Friday to work at the [facility] [employer] and [employer] from 8:00am until 3:30pm.  She states that Dr. Curt Jews was making a list of recommendations for her as of 11/26/2021.  She states that she does not want to drive to [facility] or to [place].  She also states that it has to be somewhere that accepts her Medicare and [employer].  The [name] [name] was interested in [facility] but they do not have any available appointments until the end of January 2023.  CSW will look for another provider for the [name] [name] an attempt to get the [name] [name] an appointment.    The [name] [name] also states that she has a dentist appointment on 12/01/2021 at 9:30am and she would like to be discharged before this appointment because she states that if she misses it she will lose $1500 dollars.  CSW will inform the doctors of this information.

## 2021-11-30 DIAGNOSIS — F322 Major depressive disorder, single episode, severe without psychotic features: Secondary | ICD-10-CM

## 2021-11-30 MED ORDER — POTASSIUM CHLORIDE CRYS ER 20 MEQ PO TBCR: 20.0000 meq | EXTENDED_RELEASE_TABLET | Freq: Two times a day (BID) | ORAL | Status: DC

## 2021-11-30 MED ORDER — DESVENLAFAXINE SUCCINATE ER 50 MG PO TB24: 50.0000 mg | ORAL_TABLET | Freq: Every day | ORAL | 0 refills | Status: DC

## 2021-11-30 MED ORDER — POTASSIUM CHLORIDE CRYS ER 20 MEQ PO TBCR
40.0000 meq | EXTENDED_RELEASE_TABLET | Freq: Two times a day (BID) | ORAL | Status: DC
  Administered 2021-11-30: 10:00:00 40 meq via ORAL
  Filled 2021-11-30: qty 2

## 2021-11-30 MED ORDER — CHLORPROMAZINE HCL 25 MG PO TABS: 75.0000 mg | ORAL_TABLET | Freq: Two times a day (BID) | ORAL | 0 refills | Status: DC

## 2021-11-30 MED ORDER — ZOLPIDEM TARTRATE 5 MG PO TABS: 5.0000 mg | ORAL_TABLET | Freq: Every day | ORAL | 0 refills | Status: DC

## 2021-11-30 MED ORDER — NEBIVOLOL HCL 10 MG PO TABS
10.0000 mg | ORAL_TABLET | Freq: Two times a day (BID) | ORAL | Status: DC
  Filled 2021-11-30 (×5): qty 1

## 2021-11-30 NOTE — Discharge Summary (Addendum)
Physician Discharge Summary Note  Patient:  [name] [name] is an 65 y.o., female MRN:  [id number] DOB:  04-05-1956 Patient phone:  [phone] (home)  Patient address:   [place] [address],  Total Time spent with patient: 30 minutes  Date of Admission:  11/26/2021 Date of Discharge: 11/30/2021  Reason for Admission: Depressive symptoms, removed from the workplace due to perfused crying  Principal Problem: MDD (major depressive disorder), severe ([place]) Discharge Diagnoses: Principal Problem:   MDD (major depressive disorder), severe ([place]) Active Problems:   Chronic fatigue   Anxiety   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 - no current med.   Glucose intolerance (impaired glucose tolerance)    on Metformin   H/O hiatal hernia    "sliding"   History of echocardiogram    Echo 1/16: EF 60-65, no RWMA, Gr 1 DD, mild LAE, trivial pericardial eff post to heart   History of echocardiogram    Echo 11/17: EF 55-60, no RWMA, Gr 1 DD, normal RV function.   History of palpitations    Holter 12/15: NSR, PACs   Hx of migraines    Hypothyroidism    Osteoarthritis    knees   Positional headache since 1997   if lies on left side    Past Surgical History:  Procedure Laterality Date   CHOLECYSTECTOMY  04/21/2011   CHONDROPLASTY  10/19/2012   Procedure: CHONDROPLASTY;  Surgeon: Yvette Rack., MD;  Location: [facility];  Service: Orthopedics;  Laterality: Right;   COLONOSCOPY     KNEE ARTHROSCOPY  10/19/2012   Procedure: ARTHROSCOPY KNEE;  Surgeon: Yvette Rack., MD;  Location: [facility];  Service: Orthopedics;  Laterality: Right;   KNEE ARTHROSCOPY Right 02/01/2013   Procedure: RIGHT KNEE ARTHROSCOPY WITH MEDIAL MENISCECTOMY, DEBRIDEMENT CHONDROMALACIA PATELLA;  Surgeon: Yvette Rack., MD;  Location: [facility];  Service: Orthopedics;  Laterality: Right;  RIGHT KNEE ARTHROSCOPY WITH MEDIAL MENISCECTOMY, DEBRIDEMENT CHONDROMALACIA PATELLA   KNEE ARTHROSCOPY Left 03/15/2013   Procedure: LEFT KNEE ARTHROSCOPY WITH DEBRIDEMENT/SHAVING CHONDROPLASTY WITH MEDIAL MENISECTOMY;  Surgeon: Yvette Rack., MD;  Location: [facility];  Service: Orthopedics;  Laterality: Left;   KNEE ARTHROSCOPY WITH LATERAL MENISECTOMY  10/19/2012   Procedure: KNEE ARTHROSCOPY WITH LATERAL MENISECTOMY;  Surgeon: Yvette Rack., MD;  Location: [facility];  Service: Orthopedics;  Laterality: Right;   TONSILLECTOMY AND ADENOIDECTOMY     age 32   TUBAL LIGATION  1986   WISDOM TOOTH EXTRACTION     Family History:  Family History  Problem Relation Age of Onset   Lung cancer Father 54       dies age 51   Emphysema Father    COPD Brother 56       died age 70   Stroke Mother  Hypertension Sister    Thyroid disease Sister        Graves Disease   Breast cancer Neg Hx    Family Psychiatric  History: Sister - Depression Cousin - Depression and Anxiety Social History:  Social History   Substance and Sexual Activity  Alcohol Use Yes   Comment: occ     Social History   Substance and Sexual Activity  Drug Use No    Social History   Socioeconomic History   Marital status: Widowed    Spouse name: Not on file   Number of children: Not on file   Years of education: Not on file   Highest education level: Not on file  Occupational History   Occupation: Land     Employer: [employer]  Tobacco Use   Smoking status: Never   Smokeless tobacco: Never  Vaping Use   Vaping Use: Never used  Substance and Sexual Activity   Alcohol use: Yes    Comment: occ   Drug use: No   Sexual activity: Yes    Partners: Male    Birth control/protection: Post-menopausal  Other Topics Concern   Not on file  Social History Narrative   Widowed late 2019 after 40+ years of marriage   3 sons one daughter   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   No/never tobacco no drug use 1 caffeinated beverage daily, occasional or rare glass of wine   Social Determinants of Health   Financial Resource Strain: Not on file  Food Insecurity: Not on file  Transportation Needs: Not on file  Physical Activity: Not on file  Stress: Not on file  Social Connections: Not on file    Hospital Course:  [name] [name] is a 65 year old female who presented endorsing significant depression.  Patient had to be very tearful when she presented as a walk-in to the behavioral health Hospital.  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  Patient was noted to be needy with providers and staff time during admission.Patient was very preoccupied with medications and side effects and required constant redirection. Patient was not noted to have any significant memory issues or difficulty with day-to-day activities, rather she appeared to be attempting to seek validation by staff.  Patient was noted to recently have been fired from her outpatient psychiatrist prior to admission.   During her hospitalization patient was noted to endorse wishing to take 1 medication (imipramine) but the next day endorsed having side effects that she has had in the past and wishing to discontinue the medication.  Imipramine was discontinued and patient was started on Thorazine due to ruminative behaviors noted on  exam and patient endorsing significant anxiety and fear regarding things beyond her control. Patient's Thorazine was titrated upward and changed to twice daily dosing prior to discharge for improved compliance with dosing.  Patient did appear to respond well to Thorazine as she was noted to require less redirection and was a bit less preoccupied with medications prior to discharge.Patient was started on Pristiq for depressive symptoms.  Patient was otherwise continued on her  Neurontin, Nudexta, and Ativan as needed. Her Topamax was increased to 125mg  bid from home dose of 100mg  bid. Patient's home Ambien was decreased from 10 mg to 5 mg, patient was not noted to have any insomnia during hospitalization.    On day of discharge patient was  noted to be a bit more focused but continued to ruminate. Overall patient's dependent personality disorder appeared to be significantly impacting her prognosis regarding her depression.  Patient was noted to do well and integrate well into the milieu.  Patient did not endorse AVH, SI or HI at the time of discharge.  Patient was noted to do well in group therapy sessions however she would often voice that she wanted 1: 1 therapy sessions.    During her admission she was noted to have a slightly low potassium and was given a replacement dose of K+. Her ALT was mildly elevated at 54 and her TSH was low at  0.059. Her cholesterol was elevated at 219 with LDL 138.  Orders were in place for a CBC since it was not obtained on admission as well as FT3, FT4, and repeat CMP but she did not wish to remain in the hospital for additional lab draws and agreed to see her PCP for recheck labs after discharge. She was encouraged to see her primary care doctor next week without fail for recheck of her potassium, recheck of her thyroid labs, monitoring of her mildly elevated cholesterol, and recheck of her mildly elevated liver function (ALT) panel. She was reminded she will need ongoing  monitoring of her weight, AIMS, CBC, glucose, lipids, and EKG while on Thorazine. She was advised to fluid hydrate and watch for constipation and dry mouth while on her medications.    Physical Findings: AIMS: Facial and Oral Movements Muscles of Facial Expression: None, normal Lips and Perioral Area: None, normal Jaw: None, normal Tongue: None, normal,Extremity Movements Upper (arms, wrists, hands, fingers): None, normal Lower (legs, knees, ankles, toes): None, normal, Trunk Movements Neck, shoulders, hips: None, normal, Overall Severity Severity of abnormal movements (highest score from questions above): None, normal Incapacitation due to abnormal movements: None, normal Patient's awareness of abnormal movements (rate only patient's report): No Awareness, Dental Status Current problems with teeth and/or dentures?: No Does patient usually wear dentures?: No    Musculoskeletal: Strength & Muscle Tone: within normal limits Gait & Station: normal Patient leans: N/A   Psychiatric Specialty Exam:  Presentation  General Appearance: Appropriate for Environment  Eye Contact:Good  Speech:Clear and Coherent  Speech Volume:Normal  Handedness:Right   Mood and Affect  Mood:Euthymic  Affect:Appropriate   Thought Process  Thought Processes:Goal Directed  Descriptions of Associations:Intact  Orientation:Full (Time, Place and Person)  Thought Content:Logical  History of Schizophrenia/Schizoaffective disorder:No  Duration of Psychotic Symptoms:No data recorded Hallucinations:Hallucinations: None  Ideas of Reference:None  Suicidal Thoughts:Suicidal Thoughts: No  Homicidal Thoughts:Homicidal Thoughts: No   Sensorium  Memory:Immediate Good; Recent [place]  Insight:None   Executive Functions  Concentration:Fair  Attention Span:Fair  [place] of Knowledge:Good  Language:Good   Psychomotor Activity  Psychomotor Activity:Psychomotor  Activity: Normal AIMs 0 no cogwheeling, no stiffness, no tremor  Assets  Assets:Housing; Social Support   Sleep  Sleep:Sleep: Good    Physical Exam HENT:     Head: Normocephalic and atraumatic.  Eyes:     Extraocular Movements: Extraocular movements intact.  Pulmonary:     Effort: Pulmonary effort is normal.  Skin:    General: Skin is dry.  Neurological:     Mental Status: She is alert.   Review of Systems  Psychiatric/Behavioral:  Negative for hallucinations and suicidal ideas. The patient does not have insomnia.   Blood pressure 140/82, pulse 84, temperature 97.9 F (36.6 C), temperature source Oral, resp. rate 17, height 5' (  1.524 m), weight 84.4 kg, last menstrual period 12/12/2005, SpO2 100 %. Body mass index is 36.33 kg/m.   Social History   Tobacco Use  Smoking Status Never  Smokeless Tobacco Never   Tobacco Cessation:  N/A, patient does not currently use tobacco products   Blood Alcohol level:  No results found for: [facility]  Metabolic Disorder Labs:  Lab Results  Component Value Date   HGBA1C 5.6 11/27/2021   MPG 114.02 11/27/2021   MPG 108 05/24/2021   No results found for: PROLACTIN Lab Results  Component Value Date   CHOL 219 (H) 11/27/2021   TRIG 77 11/27/2021   HDL 66 11/27/2021   CHOLHDL 3.3 11/27/2021   VLDL 15 11/27/2021   LDLCALC 138 (H) 11/27/2021   LDLCALC 117 (H) 05/24/2021    See Psychiatric Specialty Exam and Suicide Risk Assessment completed by Attending Physician prior to discharge.  Discharge destination:  Home  Is patient on multiple antipsychotic therapies at discharge:  No   Has Patient had three or more failed trials of antipsychotic monotherapy by history:  No  Recommended Plan for Multiple Antipsychotic Therapies: NA   Allergies as of 11/30/2021       Reactions   Levaquin [levofloxacin] Other (See Comments)   MUSCLE PAIN   Nickel Rash        Medication List     STOP taking these medications     dicyclomine 10 MG capsule Commonly known as: BENTYL   Travoprost (BAK Free) 0.004 % Soln ophthalmic solution Commonly known as: Travatan Z   Vilazodone HCl 40 MG Tabs Commonly known as: VIIBRYD       TAKE these medications      Indication  albuterol 108 (90 Base) MCG/ACT inhaler Commonly known as: ProAir HFA Inhale 2 puffs into the lungs every 6 (six) hours as needed for wheezing or shortness of breath. Use as needed only  if your can't catch your breath  Indication: Asthma   amLODipine 5 MG tablet Commonly known as: NORVASC Take 1 tablet by mouth daily  Indication: High Blood Pressure Disorder   chlorproMAZINE 25 MG tablet Commonly known as: THORAZINE Take 3 tablets (75 mg total) by mouth 2 (two) times daily.  Indication: Feeling Anxious   desvenlafaxine 50 MG 24 hr tablet Commonly known as: PRISTIQ Take 1 tablet (50 mg total) by mouth daily. Start taking on: December 01, 2021  Indication: Major Depressive Disorder   gabapentin 800 MG tablet Commonly known as: NEURONTIN Take 1 tablet (800 mg total) by mouth 4 (four) times daily. What changed: additional instructions  Indication: Fibromyalgia Syndrome   hydrochlorothiazide 12.5 MG tablet Commonly known as: HYDRODIURIL Take 1 tablet by mouth once a day  Indication: High Blood Pressure Disorder   ipratropium-albuterol 0.5-2.5 (3) MG/3ML Soln Commonly known as: DUONEB Take 3 mLs by nebulization every 6 (six) hours as needed (breathing treatment).  Indication: Asthma   LORazepam 1 MG tablet Commonly known as: ATIVAN TAKE 1 TABLET BY MOUTH EVERY 6 HOURS AS NEEDED  Indication: Feeling Anxious   mometasone 50 MCG/ACT nasal spray Commonly known as: NASONEX PLACE 2 SPRAYS INTO EACH NOSTRIL DAILY.  Indication: Hayfever   montelukast 10 MG tablet Commonly known as: SINGULAIR Take 1 tablet by mouth once a day  Indication: Asthma   nebivolol 10 MG tablet Commonly known as: BYSTOLIC TAKE 1 TABLET BY MOUTH 2  TIMES DAILY  Indication: High Blood Pressure Disorder   neomycin-polymyxin b-dexamethasone 3.5-10000-0.1 Oint Commonly known as: MAXITROL Place 1  application into the right eye at bedtime.  Indication: Dry eye   NP Thyroid 90 MG tablet Generic drug: thyroid Take 1 tablet (90 mg total) by mouth daily.  Indication: Underactive Thyroid   Nuedexta 20-10 MG capsule Generic drug: Dextromethorphan-quiNIDine TAKE 1 CAPSULE BY MOUTH TWICE A DAY  Indication: Behavioral Disorders associated with Dementia   pantoprazole 40 MG tablet Commonly known as: Protonix Take 1 tablet by mouth 2 (two) times daily.  Indication: Heartburn   timolol 0.5 % ophthalmic solution Commonly known as: TIMOPTIC Instill 1 drop in right eye twice a day  Indication: Hx   topiramate 100 MG tablet Commonly known as: TOPAMAX TAKE 1 TABLET BY MOUTH 2 TIMES DAILY What changed: how much to take  Indication: Hx   traZODone 50 MG tablet Commonly known as: DESYREL Take 1 tablet (50 mg total) by mouth at bedtime.  Indication: Trouble Sleeping   Vitamin D 125 MCG (5000 UT) Caps Take 5,000 Units by mouth daily. What changed: Another medication with the same name was removed. Continue taking this medication, and follow the directions you see here.  Indication: Vitamin D Deficiency   zolpidem 5 MG tablet Commonly known as: AMBIEN Take 1 tablet (5 mg total) by mouth at bedtime. What changed:  medication strength how much to take how to take this when to take this  Indication: Trouble Sleeping        Follow-up Information     [facility] Follow up on 12/21/2021.   Why: You are scheduled for an appointment on 12/21/2021 at 4:00pm.  This provider will be setting you up with another provider.  Please discuss this with them at this appointment.  Phone: [phone] Contact information: [address] [place] [address]        [facility].  Call.   Why: Please call to scheduled an appointment as soon as possible after discharge if you would like to keep this therapist. Contact information: Address: [place] [address]  Phone: [phone]        [facility] to.   Why: Please go to this provider to follow-up with labs and other health related concerns and questions. Contact information: Address: [address] [place], [place]  Phone: [phone] Fax: [phone]                Follow-up recommendations:  Patient is instructed to take all prescribed medications as recommended. Report any side effects or adverse reactions to your outpatient psychiatrist. Patient is instructed to abstain from alcohol and illegal drugs while on prescription medications. In the event of worsening symptoms, patient is instructed to call the crisis hotline, 911, or go to the nearest emergency department for evaluation and treatment.    Comments:  Follow up recommendations: - Activity as tolerated. - Diet as recommended by PCP. - Keep all scheduled follow-up appointments as recommended.   Signed:  PGY-2 Freida Busman, MD 11/30/2021, 2:55 PM

## 2021-11-30 NOTE — [facility] Suicide Risk Assessment (Signed)
[facility] INPATIENT:  Family/Significant Other Suicide Prevention Education  Suicide Prevention Education:  Contact Attempts: Sylwia Cuervo [phone] (Son) has been identified by the patient as the family member/significant other with whom the patient will be residing, and identified as the person(s) who will aid the patient in the event of a mental health crisis.  With written consent from the patient, two attempts were made to provide suicide prevention education, prior to and/or following the patient's discharge.  We were unsuccessful in providing suicide prevention education.  A suicide education pamphlet was given to the patient to share with family/significant other.  Date and time of first attempt:11/29/2021 at 2:30pm Date and time of second attempt: 11/30/2021 at 10:46am   Darleen Crocker 11/30/2021, 10:45 AM

## 2021-11-30 NOTE — Progress Notes (Signed)
°  [facility] Adult Case Management Discharge Plan :  Will you be returning to the same living situation after discharge:  Yes,  Home  At discharge, do you have transportation home?: Yes,  Own Car  Do you have the ability to pay for your medications: Yes,  Insurance   Release of information consent forms completed and in the chart;  Patient's signature needed at discharge.  Patient to Follow up at:  Follow-up Information     [facility] Follow up on 12/21/2021.   Why: You are scheduled for an appointment on 12/21/2021 at 4:00pm.  This provider will be setting you up with another provider.  Please discuss this with them at this appointment.  Phone: [phone] Contact information: [address] [place] [address]        [facility]. Call.   Why: Please call to scheduled an appointment as soon as possible after discharge if you would like to keep this therapist. Contact information: Address: [place] [address]  Phone: [phone]        [place] to.   Why: Please go to this provider to follow-up with labs and other health related concerns and questions. Contact information: Address: [address] [place], [place]  Phone: [phone] Fax: [phone]                Next level of care provider has access to [place] and Suicide Prevention discussed: Yes,  with patient and son      Has patient been referred to the Quitline?: N/A patient is not a smoker  Patient has been referred for addiction treatment: N/A  Darleen Crocker, [place] 11/30/2021, 10:43 AM

## 2021-11-30 NOTE — [facility] Suicide Risk Assessment (Signed)
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 The direct patient care time included face-to-face time with the patient, reviewing the patient's chart, communicating with other professionals, and coordinating care. Greater than 50% of this time was spent in counseling or coordinating care with the patient regarding goals of hospitalization, psycho-education, and discharge planning needs.  Subjective: Patient was seen on rounds with Doctor, hospital. She denies SI, HI, AVH, paranoia, or delusions. She feels her mood is more stable and she denies medication side-effects other than some dry mouth and sedation with her Thorazine dose. She reports stable and improved sleep and fair appetite. She requests discharge today so she can prepare for a dental visit first thing tomorrow morning. She can articulate a safety and discharge plan. Time was spent discussing the need to have her PCP recheck her potassium, thyroid panels, elevated cholesterol, and mildly elevate liver function enzyme (ALT) as an outpatient. She has been given a replacement dose of potassium today and was encouraged to eat potassium rich foods after discharge. She was advised that a CBC was not obtained on admission and that orders were in for CBC, CMP, T3 and T4 today, but she declines the option of staying tonight for a repeat lab draw and agrees to see her PCP without fail for monitoring after discharge. Time was spent educating her on the need for monitoring of her AIMS, EKG, CBC, lipids, glucose and weight while on an antipsychotic. Time was given for questions.   Musculoskeletal: Strength & Muscle Tone:  within normal limits Gait & Station: normal Patient leans: N/A Psychiatric Specialty Exam: Physical Exam Vitals reviewed.  Constitutional:      Appearance: Normal appearance.  HENT:     Head: Normocephalic and atraumatic.  Pulmonary:     Effort: Pulmonary effort is normal.  Neurological:     General: No focal deficit present.     Mental Status: She is alert.    Review of Systems - positive for dry mouth and mild sedation  Blood pressure 140/82, pulse 84, temperature 97.9 F (36.6 C), temperature source Oral, resp. rate 17, height 5' (1.524 m), weight 84.4 kg, last menstrual period 12/12/2005, SpO2 100 %.Body mass index is 36.33 kg/m.  General Appearance: Well Groomed and casually dressed  Eye Contact:  Good  Speech:  Clear and Coherent and Normal Rate  Volume:  Normal  Mood:  mildly anxious  Affect:  Congruent  Thought Process: mildly circumstantial and ruminative about discharge planning  Orientation:  Full (Time, Place, and Person)  Thought Content:  Logical and denies AVH or paranoia; no delusions noted  Suicidal Thoughts:   denied  Homicidal Thoughts:   denied  Memory:  Recent;   Good  Judgement:  Fair  Insight:  Fair  Psychomotor Activity:  Normal, no cogwheeling, no tremor, no stiffness  Concentration:  Concentration: Good and Attention Span: Good  Recall:  Good  Fund of Knowledge:  Good  Language:  Good  Akathisia:  Negative  AIMS (if indicated):   0  Assets:  Communication Skills Desire for Improvement Housing Resilience Social Support Talents/Skills Vocational/Educational  ADL's:  Intact  Cognition:  WNL    Mental Status Per Nursing  Assessment::   On Admission:    Demographic Factors:  Age 66 or older, Caucasian, and Living alone  Loss Factors: Loss of significant relationship and Decline in physical health  Historical Factors: Anniversary of important loss  Risk Reduction Factors:   Sense of responsibility to family, Employed, Positive social  support, and Positive coping skills or problem solving skills  Continued Clinical Symptoms:  More than one psychiatric diagnosis Previous Psychiatric Diagnoses and Treatments Depression diagnosis  Cognitive Features That Contribute To Risk:  Thought constriction (tunnel vision)    Suicide Risk:  Mild:  There are no identifiable plans, no associated intent, mild dysphoria and related symptoms, good self-control (both objective and subjective assessment), few other risk factors, and identifiable protective factors, including available and accessible social support.   Follow-up Information     [facility] Follow up on 12/21/2021.   Why: You are scheduled for an appointment on 12/21/2021 at 4:00pm.  This provider will be setting you up with another provider.  Please discuss this with them at this appointment.  Phone: [phone] Contact information: [address], [place] [address]        [facility]. Call.   Why: Please call to scheduled an appointment as soon as possible after discharge if you would like to keep this therapist. Contact information: Address: [place] [address]  Phone: [phone]        [place] to.   Why: Please go to this provider to follow-up with labs and other health related concerns and questions. Contact information: Address: [address] [place], [place]  Phone: [phone] Fax: [phone]                Plan Of Care/Follow-up recommendations:  Activity:  as tolerated Diet:  heart healthy Other:  Patient advised to keep scheduled outpatient appointments and to comply with medications. She was encouraged to see her primary care doctor next week without fail for recheck of her potassium, recheck of her thyroid labs, monitoring of her mildly elevated cholesterol, and recheck of her mildly elevate liver  function (ALT) panel. She was reminded she will need ongoing monitoring of her weight, AIMS, CBC, glucose, lipids, and EKG while on Thorazine. She was advised to fluid hydrate and watch for constipation and dry mouth while on her medications.   Harlow Asa, MD, FAPA 11/30/2021, 11:02 AM

## 2021-11-30 NOTE — Progress Notes (Signed)
Pt discharged to lobby. Pt was stable and appreciative at that time. All papers and prescriptions were given and valuables returned. Verbal understanding expressed. Denies SI/HI and A/VH. Pt given opportunity to express concerns and ask questions.  

## 2021-12-01 ENCOUNTER — Telehealth: Payer: Self-pay | Admitting: Psychiatry

## 2021-12-01 NOTE — Telephone Encounter (Signed)
Pt  called reporting released from [facility] yesterday. Given return to work date @ 12/07/21 when d/c. Has an apt w/CC 12/21/21. Requesting CC to evaluate and determine then, if stable enough to return to work.  Asking for a letter stating this info. Has apt 12/21/21 and CC will determine what return to work date should be.Contact pt # [phone] asking nurse to return call about this call after consulting w/CC.Marland Kitchen

## 2021-12-01 NOTE — Plan of Care (Cosign Needed)
Patient noted to be discharged on previous OP dose of Topomax 100mg . Patient was called and endorsed that she preferred her inpt dose 125mg . Additional 25mg  pills for 1 month BID supply was [facility] her [facility].  During phone call patient endorsed that she had also not received her Doctor's note to return to work. A note was written for patient and sent to her MyChart.   Patient also endorsed that she has missed a few doses of thorazine due to none being available at her pharmacy, but her pharmacy let her know they would order it and she should be able to fill her prescription today.   PGY-2 Damita Dunnings, MD

## 2021-12-02 ENCOUNTER — Other Ambulatory Visit (HOSPITAL_COMMUNITY): Payer: Self-pay

## 2021-12-02 NOTE — Telephone Encounter (Signed)
Spoke with pt via [name] [name] doing the talking since I'm limited on my voice, she is asking if it's okay to have her RTW date moved out till after her apt in January with CC. Informed pt that was fine and she will have [employer] fax over paper work for her as well.  She also reports starting Thorazine in the hospital at 75 mg bid, she reports it makes her too drowsy and she's worried about her driving. She went to her daughter's house yesterday to babysit and driving back she was very drowsy. Can the dose be reduced any?

## 2021-12-02 NOTE — Telephone Encounter (Signed)
Agree with delay RTW until after accessed 12/21/21.  She can change the dose to 25 mg AM, 25 mg after work, and 100 mg HS.  This shifts more to the evening to reduce daytime drowsiness.

## 2021-12-02 NOTE — Telephone Encounter (Signed)
Rtc to pt and explained the new dosing of Thorazine. She did agree. She also needs a letter emailed to her Outlook if possible today.   Informed her that shouldn't be a problem.

## 2021-12-06 NOTE — Telephone Encounter (Signed)
Letter emailed to pt and will complete FMLA when returning back to office.

## 2021-12-07 DIAGNOSIS — E876 Hypokalemia: Secondary | ICD-10-CM | POA: Diagnosis not present

## 2021-12-07 DIAGNOSIS — E039 Hypothyroidism, unspecified: Secondary | ICD-10-CM | POA: Diagnosis not present

## 2021-12-07 DIAGNOSIS — F418 Other specified anxiety disorders: Secondary | ICD-10-CM | POA: Diagnosis not present

## 2021-12-07 DIAGNOSIS — I10 Essential (primary) hypertension: Secondary | ICD-10-CM | POA: Diagnosis not present

## 2021-12-07 DIAGNOSIS — R748 Abnormal levels of other serum enzymes: Secondary | ICD-10-CM | POA: Diagnosis not present

## 2021-12-08 DIAGNOSIS — Z634 Disappearance and death of family member: Secondary | ICD-10-CM | POA: Diagnosis not present

## 2021-12-08 DIAGNOSIS — F411 Generalized anxiety disorder: Secondary | ICD-10-CM | POA: Diagnosis not present

## 2021-12-08 DIAGNOSIS — F331 Major depressive disorder, recurrent, moderate: Secondary | ICD-10-CM | POA: Diagnosis not present

## 2021-12-09 ENCOUNTER — Telehealth: Payer: Self-pay | Admitting: Psychiatry

## 2021-12-09 ENCOUNTER — Other Ambulatory Visit (HOSPITAL_COMMUNITY): Payer: Self-pay

## 2021-12-09 NOTE — Telephone Encounter (Signed)
Rtc to pt and she located the work note in her work [employer]. She was told to check her home email, I believe there were communication issues. She was good on the phone. She wanted to make sure we had all her pw. And asked if we were filling it out before or visit or at her visit. Informed her I would have to discuss with Dr. Clovis Pu.   I will update her once I know.

## 2021-12-09 NOTE — Telephone Encounter (Signed)
It was emailed to her over the weekend 12/06/2021 she has already been told this. There are at least 4 fmla's in front of hers to be completed.

## 2021-12-09 NOTE — Telephone Encounter (Signed)
Received fax from [employer] regarding Jess Barters for completion of [employer] Attending Physician's Statement. Placed in Traci's box.

## 2021-12-09 NOTE — Telephone Encounter (Signed)
Pt had called on 12/28 about status of [place] Disability and [employer] FMLA paperwork.  I see as of this morning, the [place] paperwork has been put in Traci's box.  I called pt back to advise status.  She says Tressia Miners was to send her a letter by email that she hasn't received.  She said she wants Traci to call her back asap as there is something else she needs by Jan 6.   Next appt 1/10

## 2021-12-09 NOTE — Telephone Encounter (Signed)
Please call.

## 2021-12-10 ENCOUNTER — Telehealth: Payer: Self-pay

## 2021-12-10 NOTE — Telephone Encounter (Signed)
Received fax from [facility] and Disability Dept for completion of [place] on [facility]. Placed in Traci's box.

## 2021-12-13 ENCOUNTER — Other Ambulatory Visit (HOSPITAL_COMMUNITY): Payer: Self-pay

## 2021-12-14 DIAGNOSIS — Z0289 Encounter for other administrative examinations: Secondary | ICD-10-CM

## 2021-12-15 DIAGNOSIS — F331 Major depressive disorder, recurrent, moderate: Secondary | ICD-10-CM | POA: Diagnosis not present

## 2021-12-15 DIAGNOSIS — F411 Generalized anxiety disorder: Secondary | ICD-10-CM | POA: Diagnosis not present

## 2021-12-15 DIAGNOSIS — Z634 Disappearance and death of family member: Secondary | ICD-10-CM | POA: Diagnosis not present

## 2021-12-15 NOTE — Telephone Encounter (Signed)
Paper work for [name] [name] was completed and faxed on 12/14/2021. Still working on [name]

## 2021-12-16 ENCOUNTER — Other Ambulatory Visit: Payer: Self-pay | Admitting: Psychiatry

## 2021-12-16 ENCOUNTER — Other Ambulatory Visit (HOSPITAL_COMMUNITY): Payer: Self-pay

## 2021-12-16 MED ORDER — TRAZODONE HCL 50 MG PO TABS
50.0000 mg | ORAL_TABLET | Freq: Every day | ORAL | 0 refills | Status: DC
  Filled 2021-12-16: qty 30, 30d supply, fill #0

## 2021-12-16 MED ORDER — ZOLPIDEM TARTRATE 10 MG PO TABS
5.0000 mg | ORAL_TABLET | Freq: Every evening | ORAL | 0 refills | Status: DC | PRN
Start: 2021-12-16 — End: 2021-12-21
  Filled 2021-12-16: qty 5, 5d supply, fill #0

## 2021-12-16 NOTE — Telephone Encounter (Signed)
Dr. Clovis Pu has signed the [employer] form and it's been faxed as well.

## 2021-12-16 NOTE — Telephone Encounter (Signed)
We received 2 refill requests for patient - trazodone and Ambien. The Ambien was D/C'd upon discharge from the hospital. I know patient is scheduled to see you on 1/10. Should either of these medications be refilled now, or wait and you can address them at her visit.

## 2021-12-17 ENCOUNTER — Other Ambulatory Visit (HOSPITAL_COMMUNITY): Payer: Self-pay

## 2021-12-20 ENCOUNTER — Other Ambulatory Visit (HOSPITAL_COMMUNITY): Payer: Self-pay

## 2021-12-21 ENCOUNTER — Ambulatory Visit (INDEPENDENT_AMBULATORY_CARE_PROVIDER_SITE_OTHER): Payer: 59 | Admitting: Psychiatry

## 2021-12-21 ENCOUNTER — Ambulatory Visit: Payer: 59 | Admitting: Psychiatry

## 2021-12-21 ENCOUNTER — Encounter: Payer: Self-pay | Admitting: Psychiatry

## 2021-12-21 ENCOUNTER — Telehealth: Payer: Self-pay | Admitting: Psychiatry

## 2021-12-21 ENCOUNTER — Other Ambulatory Visit: Payer: Self-pay

## 2021-12-21 ENCOUNTER — Other Ambulatory Visit (HOSPITAL_COMMUNITY): Payer: Self-pay

## 2021-12-21 DIAGNOSIS — F607 Dependent personality disorder: Secondary | ICD-10-CM

## 2021-12-21 DIAGNOSIS — F411 Generalized anxiety disorder: Secondary | ICD-10-CM | POA: Diagnosis not present

## 2021-12-21 DIAGNOSIS — F482 Pseudobulbar affect: Secondary | ICD-10-CM

## 2021-12-21 DIAGNOSIS — F332 Major depressive disorder, recurrent severe without psychotic features: Secondary | ICD-10-CM

## 2021-12-21 DIAGNOSIS — N959 Unspecified menopausal and perimenopausal disorder: Secondary | ICD-10-CM | POA: Diagnosis not present

## 2021-12-21 DIAGNOSIS — F4321 Adjustment disorder with depressed mood: Secondary | ICD-10-CM

## 2021-12-21 DIAGNOSIS — F4001 Agoraphobia with panic disorder: Secondary | ICD-10-CM

## 2021-12-21 DIAGNOSIS — G4733 Obstructive sleep apnea (adult) (pediatric): Secondary | ICD-10-CM

## 2021-12-21 DIAGNOSIS — F5105 Insomnia due to other mental disorder: Secondary | ICD-10-CM | POA: Diagnosis not present

## 2021-12-21 DIAGNOSIS — N951 Menopausal and female climacteric states: Secondary | ICD-10-CM | POA: Diagnosis not present

## 2021-12-21 DIAGNOSIS — E039 Hypothyroidism, unspecified: Secondary | ICD-10-CM | POA: Diagnosis not present

## 2021-12-21 DIAGNOSIS — F419 Anxiety disorder, unspecified: Secondary | ICD-10-CM | POA: Diagnosis not present

## 2021-12-21 MED ORDER — ESTRADIOL 0.05 MG/24HR TD PTTW
MEDICATED_PATCH | TRANSDERMAL | 3 refills | Status: AC
  Filled 2021-12-21: qty 24, 84d supply, fill #0
  Filled 2022-03-03: qty 24, 84d supply, fill #1

## 2021-12-21 MED ORDER — PROGESTERONE MICRONIZED 100 MG PO CAPS
ORAL_CAPSULE | ORAL | 3 refills | Status: AC
  Filled 2021-12-21: qty 90, 90d supply, fill #0

## 2021-12-21 MED ORDER — ZOLPIDEM TARTRATE 10 MG PO TABS
5.0000 mg | ORAL_TABLET | Freq: Every evening | ORAL | 0 refills | Status: DC | PRN
Start: 2021-12-21 — End: 2022-01-21
  Filled 2021-12-21: qty 30, 30d supply, fill #0

## 2021-12-21 MED ORDER — TOPIRAMATE 50 MG PO TABS
150.0000 mg | ORAL_TABLET | Freq: Two times a day (BID) | ORAL | 1 refills | Status: DC
Start: 2021-12-21 — End: 2022-03-01
  Filled 2021-12-21: qty 180, 30d supply, fill #0
  Filled 2022-01-15: qty 180, 30d supply, fill #1

## 2021-12-21 MED ORDER — DESVENLAFAXINE SUCCINATE ER 50 MG PO TB24
50.0000 mg | ORAL_TABLET | Freq: Every day | ORAL | 0 refills | Status: DC
  Filled 2021-12-21: qty 30, 30d supply, fill #0

## 2021-12-21 NOTE — Telephone Encounter (Signed)
TC  After leaving the office patient went to [employer] downstairs which offers mental health treatment to seek information about an appointment.  One of their employees approached our office manager and indicated that she had sought information about care at that location but had made statements that indicated she might be suicidal.  [employer] also indicated that their policy is to not turn any patient away. Because of the statements about concern over her having suicidal thoughts, this physician contacted the patient by phone.  She strongly denied making any statements of suicidal thoughts or suicidal intent.  She had a friend on the phone with her who also stated the patient had made no statements about being suicidal.  She denied having suicidal thoughts.  She committed to safety.  We discussed the option of her seeking psychiatric treatment at [facility].  She will consider this option among other options.  Lynder Parents, MD, DFAPA

## 2021-12-21 NOTE — Progress Notes (Signed)
[name] [name] [id number] 1956-08-20 66 y.o.    Subjective:   Patient ID:  [name] [name] is a 66 y.o. (DOB Oct 26, 1956) female.  Chief Complaint:  Chief Complaint  Patient presents with   Follow-up   Depression   Anxiety   Sleeping Problem    Depression        Associated symptoms include myalgias and headaches.  Associated symptoms include no decreased concentration, no fatigue and no suicidal ideas. Medication Refill Associated symptoms include abdominal pain, headaches and myalgias. Pertinent negatives include no chest pain, fatigue or weakness.  [name] presents to the office today for follow-up of anxiety.  seen April 2020.  She was dealing with grief and depression and sertraline was increased back to 200 mg daily.  [name] died in 22-Oct-2018  from cancer.  Married 41 years.  Is able to focus on positive memories.  Not ready to move.  seen August 2020.  No meds were changed.  She continued sertraline 200 mg daily.  seen January 2020.  Sertraline was unchanged.  She was having still grief issues related to the loss of her husband.  She was also having problems with weight attributed to the sertraline.  She was given a trial of topiramate to try to help offset some of the weight gain plus it can have some potential antianxiety effects.  seen 02/19/20 stating: Very depressed. So sad.  Don't want to get OOB.  Got lost coming here and late.  Worse for 3-4 weeks.  Days off are terrible.  Barely able to function at work.  Cry all the way home after work.  So lonely.  Kids aren't coming like they were bc they are busy.  Need to figure out her own life.  Life was centered around H and kids.  Denies suicidal thoughts  Made following med changes: Increase sertraline back to 300 mg daily.  She's aware it's higher than the expected usual dose. Rexulti 2 mg daily Topiramate increase to target 50 mg BID and possibly higher for obesity.  03/18/2020 appointment the following  is noted: No meds were changed. She didn't increase sertraline bc fear of weight gain.  Quite a bit different.  No crying.  Sad a few times.  Better in 5 days.  Feels much less depressed.  Better energy, interest, enjoyment.  Also weather helping and started planting.  More motivation.  Desperate to lose weight.  Has some chronic pain and taking gabapentin.   Started gym cycle and lost some weight.   Worried over D with depression and conflict with pt.  D was always closer to father than her.  D distanced from her and recent bad outburst with her.  D recently blocked communication with her.   Disc getting her help.  05/19/2020 appointment with the following noted: Tried to self medicate bc felt good on Rexulti and tried reducing sertraline to 150 for 2 weeks and got worse.  So increased back to 100 mg BID. Stopped cable and got too bored for 3 mos.  Restarted cable yesterday.   Still grieving.  Kids want her to sell the house but she can't. Anxiety in the morning fidgety.  Taken more Ativan lately with more panic and worry over the future.  Almost like too much caffeine before any.  Limits it. Better if busy. Does better with working.  Was too sleepy with progesterone. Successful losing weight on the topiramate. Assessment/Plan: Her panic disorder is generally controlled but more anxious  somewhat..She is still dealing with a lot of grief with waves of depression and anxiety and at times easily overwhelmed but functional.   trial Rexulti 3 mg daily for 2 weeks to see if can gain more benefit for depression and anxiety. If no benefit then reduce Rexulti back to 2 mg.  She'll call  06/01/2020 phone call reporting that 3 mg Rexulti seem to be helping and she wanted to continue it. 06/03/2020, 2 days later she called back stating it was making her too sleepy.  She was planning a trip and she was encouraged to stop the medicine until she returns from her trip and we would reevaluate. 06/11/2020 phone call  stating after stopping the Rexulti she was said, depressed and crying all the time and wanted to return to Rexulti at 1 mg daily to which that was agreed. 07/02/2020 she called back wanting to milligram tablets of Rexulti because she had increased it on her own to that dosage and felt better.  She also had stopped drinking wine in the evening which she felt like helped.  So at her request a prescription was submitted for 2 mg Rexulti to the pharmacy. 07/16/2020 phone call stating she was very lonely and wanted to go back up to 3 mg of Rexulti.  That request was refused because of her previous side effects and making frequent changes with a med with a very long half-life like Rexulti is not a good idea.  07/21/2020 appointment with the following noted: Pretty desperate to do something. She increased Rexulti AMA to 3 mg despite being asked not to do so. Still sleepy and anxious.  Stopped wine for several weeks Ativan 1 -2 mg daily at work bc very anxious. Yesterday when son was there she wasn't sleepy or depressed until he left. Denies drowsiness when driving. Feels no better with grief. Plan: Stop Rexulti DT sleepiness.   Start Latuda 20 mg daily but she refuses bc doesn't think she can eat enough with it.  Then says she'll try it.  Topiramate She would like to increase further; OK increase to 75 mg BID.  No therapy since husband died. Needs counseling desperately.  She commits.  09/22/2020 appointment with the following noted Multiple phone calls since the last visit in crisis. Tried Latuda up to 40 mg a day with 3 mg Ativan daily.  She felt like it made her worse and stopped it. Recommended haloperidol 2 mg tablets 1-2 nightly due to extreme unmanageable anxiety and fearfulness.   08/18/2020 telephone call with the following noted:Tried haloperidol 1 mg and didn't help anything.  No effect except a little sleepiness.  Took it sparingly bc afraid of it.  Afraid of tremors and TD.  I didn't to give  it a good chance.  Strongly rec ECT.  Currently will consider and agrees to go to [facility] for consult.  I'm scared.  Scared of meds too.  Currently on sertraline 200 mg daily.  Plan: Reduce sertraline to 150 daily and start duloxetine 30 mg capsule daily for 5 days, Then reduce sertraline to 100 mg and increase duloxetine to 2 capsules daily for 5 days, Then reduce sertraline to 1/2 of 100 mg aily and increase duloxetine to 90 mg daily for 5 days then stop sertraline and continue duloxetine 90 mg daily. Disc SE.  09/09/2020 extended phone call in crisis with nurse.  She was still transitioning from sertraline to duloxetine and trazodone was added for sleep. She's refusing ECT consult with [facility]  and didn't go. She has found a new therapist and seen her several times and she's local.  She is hopeful it might help. On duloxetine 90 mg since about 09/02/20. Never took buspirone but did receive it. Takes Ativan. Taking topiramate 75 mg in AM and on 50 mg PM.  Never increased to 75 mg BID. Takes Ambien initially and trazodone 25 mg HS and sleep not as good with duloxetine vs Zoloft.  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  Before that was sobbing and often not staying with herself. FMLA filled out for accomodation for leave if crying spells are unmanageable. Used to call H when she got in the car and son offered to have her call but he's less available.  So often cries in the car but onlly one bad crying spell after Nudexta. Hx migraine tx by neurologist who said MRI showed spots suggesting head injury.  Pt never remembers history of head injury. 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 Tomorrow is her and H's birthday and having family party tonight. Anniversary of 2 years is 10/25 and doing better about staying alone in her house. Uncertain mood effect from change to duloxetine so far but thinks maybe she's a  little better. Doesn't tolerate being alone very well.  Worry about winter. Plan continue Nuedexta was helpful for crying spells though it did not resolve them Continue duloxetine 90 mg to give it more time to work as it is only been about 3 weeks Okay per her request to increase topiramate to 75 mg twice daily.  10/26/2020 appointment with the following noted: Takes Ativan in morning.  Drinks more caffeine at home than work.  Might cause some anxiety.  But anxious at work too over insecurity and takes Ativan in morning also.  Takes it to drive. Average 2-3 Ativan daily.  Less anxious if not alone. On duloxetine 90 for 7 weeks. Crying spells still better on Nudexta than not.  Better than last time. Duloxetine is helping with depression and anxiety both are better than last visit.  Dep 5/10.  Anxiety  6/10 bc of work and driving.  Friends notice the benefit also. Not a winter person and dreads it. Not much appetite in evening but eats good lunch at work and breakfast.  Topiramate has curbed her appetite.   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   Work stressful and hates driving including in the winter and doesn't think she can do it anymore.   Sleep about 5 hours and sometimes can't get back to sleep on trazodone 50 and Ambien and Ativan but doesn't think she can take more trazodone DT hangover.  No amnesia.  Plan: Nuedexta markedly helpful for crying but hasn't resolved it. Duloxetine 90 on 7 weeks with benefit..  Increase dulxetine to max to see if can get more benefit bc sig residual sx. 120 mg daily.  11/25/2020 appointment with the following noted: Hard time. Since here.  Started crying a lot again.  Has been consistent with Nudexta.  Felt good for 3-4 days then last night had neg interaction with daughter while in men's section of [employer].  Got triggered over seeing men's clothes and D got mad.  She was crying and got a disoriented and had trouble finding her way out.  She doesn't understand  her grief.  Crying for weeks and then briefly better and recurred. Plan to retire Feb but scared to stay by herself.  Work  is only socialization she has and it's place she can enjoy things.   Never stayed alone before. I can't stay by myself.  Not afraid but lonely.  No hobbies. Plan: Nuedexta markedly helpful for crying until the last visit it started up again. Reduce Duloxetine to  90 mg bc increase didn't help after 4 weeks and the crying may be worse related to NE. Risperidone 1 mg BID prn   12/28/2020 appointment with the following noted: Was mainly taking gabapentin for pain and it's better sor reduced gabapentin to 2 daily for months.  Forgets to take it at work. Anxiety all day long. Risperidone prn bad crying day only once and it helped.  No SE Crying a lot since here when she is alone at home or in car.  Does not do it at work. Painful cry.  Sad and lonely.  Grief cry.   More anxious driving than she used to be. Using Ativan 1 mg average 2 daily.  Plan: Nuedexta markedly helpful for crying until the last visit it crying worsened again. Continue Duloxetine  90 mg bc increase didn't help after 4 weeks and the crying may be worse related to NE. 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  If fails after a week reduce back to current dose and instead increase Risperidone 1 mg BID.   01/27/2021 appt with following noted: Never  Remembered to take gabapentin QID but could take BID.  No SE. Since here more sad than anxious.  No panic lately. Still crying spells.  Only took risperidone once.  Most of crying in AM and off work at home alone. Is losing weight with topiramate and would like to go higher. Can feel alright when with family. Plan: Reduce duloxetine to 60 mg daily. Start imipramine 1 of the 25 mg tablets nightly for 1 week,  then 2 nightly for 1 week,  then 3 at night for 1 week. Wait 1 week and get the blood test, preferably in the morning.  02/25/2021  appointment with the following noted: Still crying, horrible yesterday on the way home.  Hopeless.  Still doesn't like being alone.  Has a new counselor.  Was invited to church function tomorrow. SE cotton mouth.  awakens 2-3 times a night.  Negative thoughts about taking so much medicine. Kids and gkids in [place] and can't move away.   Angry over tracking devices.    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   Continue imipramine 75 Hs and duloxetine 60 for now and get blood test ASAP.  03/12/2021 telephone note" :tC to correct instructions on dosing of imipramine.  Her blood level of imipramine on imipramine 75 Hs and duloxetine 60 is 108.  The level needs to be about 200.  Therefore have her increase imipramine  to 100 mg for 5 nights, then increase to 150 mg.  When she needs it I will send in a prescription for imipramine 50 mg tablets 3 nightly which is double the current strength of her 25 mg tablets. She should continue the duloxetine 60 mg daily until we can achieve an adequate blood level of imipramine which this increase should accomplish in about 1 week after increasing to 150 mg daily.  She understood.  She'll call after on 150 mg for a week and we will repeat the lab.  Lynder Parents, MD, [facility]  04/20/2021 appointment with the following noted: Got Covid in April.  Back to work 2 weeks.   Took her 2 weeks to recover. On imipramine 150 mg about 3-4 nights but had SE.  Had agitated sleep and moved things around while asleep on this dose.  She backed down to 100 mg  SE dry mouth is uncomfortable. A whole lot of improvement with depression and anxiety on the med overall.  Weather changes will affect her mood.  Still takes Ativan at work.  Driving still bothers her if in traffic. Still very lonely but has gotten out more. 2 different Bible studies good.   D 67 yo is pregnant with her first. Son sees the benefit from imipramine.  He's FT minister. Less crying.    Plan: Markedly better with imipramine 100 mg combined with duloxetine 60.  Disc DDI.   Repeat level  05/17/2021 appt noted: Didn't get blood level yet.   Emotionally doing OK. CO clenching teeth.  History of mouth guard made a good while ago and started using it again.   Started HA recently develops in the afternoon.  Doesn't have it when awakens.  Son noticed her clenching and grinding teeth.  Usually unaware of grinding teeth in the day.   Sleep is pretty good. Had a little more anxiety situationally.  Taken Ativan a little more. No full panic but some anxiety.   Went to [employer] where best friend is.  She noticed a big improvement.   SE dry mouth and bruxism. Doesn't like imipramine.   Missed one day of Nudexta and duloxetine and had a crying spell.  Otherwise those spells have been better. Plan: Markedly better with imipramine 100 mg combined with duloxetine 60.  Disc DDI.   Repeat level Then consider reducing duloxetine which might be contributing to the SE of bruxism. Stop trazodone and substitute cyclobenzaprine 10 mg HS for bruxism.  06/15/2021 appointment with following noted: Eye doc next week with extremely dry eyes from imipramine evidently.  FU next week. Doesn't want to stay on the medicine.  Disc her frustrations with the med even though she acknowledges the imipramine has helped her.  Residual depression and anxiety and chronic grief remained with fears about being alone.  She has been able to continue to work. Plan: Extensive discussion around the risk of stopping imipramine which is the only thing that has helped her recently.  Discussed alternatives. Reduce the imipramine to one of the 50 mg capsules for 1 week and then stop it Start Viibryd 20 mg tablet , start 1/2 tablet for 1 week, then 1 daily with food. Reduce duloxetine to 30 mg daily.  Will attempt to stop this.  DC cyclobenzaprine bc hangover  07/05/2021 phone call asking for refill of imipramine.  When questioned  about it she indicated she never stopped taking it despite what she indicated at the last visit.  07/19/2021 appointment with the following noted: Still on Cymbalta 60 mg daily, gabapentin 800 mg average twice daily bc forgets the rest, Lorazepam 1 mg average rarely. Topomax 100 mg BID, trazodone 50 mg HS, ambien 10 HS.   Not taken any Viibryd. Never stopped imipramine bc talked to son's who said she was so much better with the imipramine.  Talked to eye doctor which is better and maybe related to shingles.   Seeing dentist today with problems with teeth bc grinding her teeth. Crying all the time getting worse in the last few weeks.  Son's getting burned out on her neediness and she's not going to see him as  frequently and so he's less available which has made her more depressed and tearful. Had stopped therapy but plans to restart it bc needed.  I need to get out of the house.  Lonely. Has not checked on local support group lately. Depression is worse than last visit.  Didn't feel this level of depression last time but is experienced as extreme loneliness.  Neglecting house hold chores. Tries to talk on phone nightly with someone. Plan: Reduce the imipramine to one of the 50 mg capsules for 1 week and then stop it Start Viibryd 20 mg tablet , start 1/2 tablet for 1 week, then 1 daily with food. Reduce duloxetine to 30 mg daily for 2 weeks then stop it.  Will attempt to stop this. Rec antipsychotic.  She's scared of antipsychotics despite at this point poor prognosis for response to antidepressant alone.  Afraid of weight gain.  Rec trial of Lybalvi DT weight gain with olanzapine.  08/26/2021 phone call: Next visit is 09/27/21. Ruthann called in extreme anxiety with the Vybrid she has been on for six weeks. She states she is crying all the time. She would like to speak to someone regarding this. 08/26/2021 MD message:RTC  Anxiety worse after change Viibryd 20  and off imipramine.  She came off  duloxetine but felt bad and took a few for a several days. Did not take Lybalvi.  She looked up ruminating and didn't think that was what she was doing.  Plan: Increase Viibryd to 40 mg daily. Keep option of Lybalvi bc it can help TRD and anxiety based on the known benefit of olanzapine for the above. Asks for something for crying.  Nothing else to offer except antipsychotic and sh's not read to try it yet.  She agrees to increase Viibryd to 40 mg daily.  08/31/2021 phone call strings summarized as follows: Complaining of stomach pain and diarrhea which she attributed to [place]. MD response: She has failed to respond to or not tolerated 13 different psychiatric medications.  She needs to be certain that the Viibryd is actually causing the diarrhea before she dismisses it as an option because the next option category is in the antipsychotic category and she is afraid of those medicines.  If she insists on coming off of Viibryd cut the dosage in half for 1 week and stop it. I will not start another antidepressant or consider another antidepressant until her appointment.  The only option I would consider is the suggestion at the last 2 appts of Lybalvi 5 mg and she can pick up samples.  If she refuses then we will wait until her scheduled appt or I can refer her for ECT at [facility] or [place] or [facility]  09/27/21 appt noted: Had bought of severe gastritis and has appt with GI Thursday.  Eating more with med helped. Refused Lybalvi. Still feels bad.  Seasonal depression with anniversary of H's death upcoming.   Equally bad when last here and now without noticeable change with Viibryd.  Dryness better off imipramine.  Has ongoing fears driving and maybe that is worse.   CO EMA and worse than last time. Difficulty enjoying things. Cont rumination on H's death chronically. Plan: Rec antipsychotic.  She's scared of antipsychotics despite at this point poor prognosis for response to antidepressant alone.   Afraid of weight gain.  Rec trial of Lybalvi DT weight gain with olanzapine.  5 mg daily. Refuses but asks to retry Abilify 10 (failed in past with different antidepressant)  with Viibryd 40. Nuedexta markedly helpful for crying until recent visit &  crying worsened again.  May get worse if stopped but will try to DC due to inadequate response.  11/17/2021 extended phone call with nurse; Rtc to pt and gave instructions on Trazodone then gave option of Seroquel. She is hesitant about raising Trazodone above 100 mg because she has gotten the hang over effect before. We then discussed Seroquel, she is hesitant but understands the low dose may be more effective for her sleep. After our discussion she will try trazodone 100 mg tonight then possibly 150 mg the next night. She did ask if the Seroquel could be sent in just in case she needed it and I was unavailable. Reassured her again of her options  11/25/21 appt noted: Took quetiapine 25 mg last night as only time but had hangover and felt like it affected driving. Bad day today. Took Abilify maybe once or twice but stopped it and can't remember why. Scared in her home now without reason.  Lonely and doesn't want to be alone. Some death thoughts without sui plans or intent. Deperssed and anxious.  12/21/2021 appointment with the following noted: The day after the last appointment during which an antipsychotic was recommended and refused and during which she was informed that she would have to find another psychiatrist due to lack of improvement, she went into behavioral health urgent care to be admitted for depression and anxiety with severe symptoms.  She was placed on Thorazine.  CO sleepiness and reduced to Thorazine and taking 25 mg BID and 50 HS.  She then reduced 25 mg AM and 50 mg HS bc sleepy.  I know I need more. Also on Pristiq 50 mg daily, Nudexta BID, trazodone 50 HS, Ambien HS, topiramate 125 BID. Taking "more" lorazepam 1 BID and 1 prn  anxiety. Awakens anxious. Still crying a lot every day and doesn't think she can RTW yet. Chronic depression and anxiety. Trouble finding doctor in [place]..  Says she's working on finding another.   Past Psychiatric Medication Trials:  Zoloft 300, Lexapro, paroxetine 80 in 2017, Duloxetine 120 Imipramine 150 SE did have some benefit Nudexta helped crying for awhile until increase duloxetine and holidays Paliperidone, olanzapine, aripiprazole 20 mg, Rexulti 3 mg sleepy Latuda Thorazine 150 mg sleepy buspirone,  clonidine, topiramate, gabapentin, metformin,  phentermine,  trazodone, Ambien, Ativan, quetiapine 25 SE hangover Remote history topiramate for migraine 1998, Belviq helped lose 85#  Pt started [facility] 2016 H had ECT  Review of Systems:  Review of Systems  Constitutional:  Positive for activity change. Negative for fatigue and unexpected weight change.       Sweating  HENT:         Dry mouth  Eyes:  Positive for visual disturbance. Negative for pain.  Cardiovascular:  Negative for chest pain and palpitations.  Gastrointestinal:  Positive for abdominal pain and diarrhea. Negative for rectal pain.  Musculoskeletal:  Positive for myalgias.  Neurological:  Positive for headaches. Negative for dizziness, tremors and weakness.  Psychiatric/Behavioral:  Positive for agitation, behavioral problems and dysphoric mood. Negative for confusion, decreased concentration, hallucinations, self-injury, sleep disturbance and suicidal ideas. The patient is nervous/anxious. The patient is not hyperactive.    Medications: I have reviewed the patient's current medications.  Current Outpatient Medications  Medication Sig Dispense Refill   albuterol (PROAIR HFA) 108 (90 Base) MCG/ACT inhaler Inhale 2 puffs into the lungs every 6 (six) hours as needed for wheezing or shortness of  breath. Use as needed only  if your can't catch your breath 18 g 3   amLODipine (NORVASC) 5 MG tablet Take 1  tablet by mouth daily 30 tablet 6   chlorproMAZINE (THORAZINE) 25 MG tablet Take 3 tablets (75 mg total) by mouth 2 (two) times daily. 180 tablet 0   Cholecalciferol (VITAMIN D) 125 MCG (5000 UT) CAPS Take 5,000 Units by mouth daily.     Dextromethorphan-quiNIDine (NUEDEXTA) 20-10 MG capsule TAKE 1 CAPSULE BY MOUTH TWICE A DAY 60 capsule 2   estradiol (VIVELLE-DOT) 0.05 MG/24HR patch Apply 1 patch twice a week by transdermal route. 24 patch 3   gabapentin (NEURONTIN) 800 MG tablet Take 1 tablet (800 mg total) by mouth 4 (four) times daily. (Patient taking differently: Take 800 mg by mouth 4 (four) times daily. Pt only takes 2 tablets  daily  one tablet every morning and one tablet every  night) 120 tablet 1   hydrochlorothiazide (HYDRODIURIL) 12.5 MG tablet Take 1 tablet by mouth once a day 30 tablet 6   ipratropium-albuterol (DUONEB) 0.5-2.5 (3) MG/3ML SOLN Take 3 mLs by nebulization every 6 (six) hours as needed (breathing treatment).     LORazepam (ATIVAN) 1 MG tablet TAKE 1 TABLET BY MOUTH EVERY 6 HOURS AS NEEDED 60 tablet 1   mometasone (NASONEX) 50 MCG/ACT nasal spray PLACE 2 SPRAYS INTO EACH NOSTRIL DAILY. 17 g 3   montelukast (SINGULAIR) 10 MG tablet Take 1 tablet by mouth once a day 30 tablet 6   nebivolol (BYSTOLIC) 10 MG tablet TAKE 1 TABLET BY MOUTH 2 TIMES DAILY 180 tablet 0   neomycin-polymyxin b-dexamethasone (MAXITROL) 3.5-10000-0.1 OINT Place 1 application into the right eye at bedtime.     pantoprazole (PROTONIX) 40 MG tablet Take 1 tablet by mouth 2 (two) times daily. 180 tablet 1   progesterone (PROMETRIUM) 100 MG capsule Take 1 capsule by mouth once a day. 90 capsule 3   thyroid (NP THYROID) 90 MG tablet Take 1 tablet (90 mg total) by mouth daily. 90 tablet 2   timolol (TIMOPTIC) 0.5 % ophthalmic solution Instill 1 drop in right eye twice a day 5 mL 3   topiramate (TOPAMAX) 50 MG tablet Take 3 tablets (150 mg total) by mouth 2 (two) times daily. 180 tablet 1   traZODone  (DESYREL) 50 MG tablet Take 1 tablet (50 mg total) by mouth at bedtime. 30 tablet 0   desvenlafaxine (PRISTIQ) 50 MG 24 hr tablet Take 1 tablet (50 mg total) by mouth daily. 30 tablet 0   zolpidem (AMBIEN) 10 MG tablet Take 1/2 to 1 tablet (5-10 mg total) by mouth at bedtime as needed. 30 tablet 0   No current facility-administered medications for this visit.    Medication Side Effects: Other: ? sweating related.  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 - no current med.   Glucose intolerance (impaired glucose tolerance)  on Metformin   H/O hiatal hernia    "sliding"   History of echocardiogram    Echo 1/16: EF 60-65, no RWMA, Gr 1 DD, mild LAE, trivial pericardial eff post to heart   History of echocardiogram    Echo 11/17: EF 55-60, no RWMA, Gr 1 DD, normal RV function.   History of palpitations    Holter 12/15: NSR, PACs   Hx of migraines    Hypothyroidism    Osteoarthritis    knees   Positional headache since 1997   if lies on left side    Family History  Problem Relation Age of Onset   Lung cancer Father 62       dies age 80   Emphysema Father    COPD Brother 27       died age 65   Stroke Mother    Hypertension Sister    Thyroid disease Sister        Graves Disease   Breast cancer Neg Hx     Social History   Socioeconomic History   Marital status: Widowed    Spouse name: Not on file   Number of children: Not on file   Years of education: Not on file   Highest education level: Not  on file  Occupational History   Occupation: Land    Employer: [employer]  Tobacco Use   Smoking status: Never   Smokeless tobacco: Never  Vaping Use   Vaping Use: Never used  Substance and Sexual Activity   Alcohol use: Yes    Comment: occ   Drug use: No   Sexual activity: Yes    Partners: Male    Birth control/protection: Post-menopausal  Other Topics Concern   Not on file  Social History Narrative   Widowed late 2019 after 40+ years of marriage   3 sons one daughter   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   No/never tobacco no drug use 1 caffeinated beverage daily, occasional or rare glass of wine   Social Determinants of Health   Financial Resource Strain: Not on file  Food Insecurity: Not on file  Transportation Needs: Not on file  Physical Activity: Not on file  Stress: Not on file  Social Connections: Not on file  Intimate Partner Violence: Not on file    Past Medical History, Surgical history, Social history, and Family history were reviewed and updated as appropriate.   M history of Lewey Body Dementia  Please see review of systems for further details on the patient's review from today.   Objective:   Physical Exam:  LMP 12/12/2005   Physical Exam Constitutional:      General: She is not in acute distress.    Appearance: She is obese.  Musculoskeletal:        General: No deformity.  Neurological:     Mental Status: She is alert and oriented to person, place, and time.     Cranial Nerves: No dysarthria.     Coordination: Coordination normal.  Psychiatric:        Attention and Perception: Attention and perception normal. She does not perceive auditory or visual hallucinations.        Mood and Affect: Mood is anxious and depressed. Affect is tearful. Affect is not labile, blunt, angry or inappropriate.        Speech: Speech normal.        Behavior: Behavior normal. Behavior is cooperative.  Thought Content:  Thought content normal. Thought content is not paranoid or delusional. Thought content does not include homicidal or suicidal ideation. Thought content does not include suicidal plan.        Cognition and Memory: Cognition and memory normal.        Judgment: Judgment normal.     Comments: Insight fair ruminative      Lab Review:     Component Value Date/Time   NA 139 11/27/2021 0643   K 3.0 (L) 11/27/2021 0643   CL 104 11/27/2021 0643   CO2 26 11/27/2021 0643   GLUCOSE 130 (H) 11/27/2021 0643   BUN 16 11/27/2021 0643   CREATININE 0.97 11/27/2021 0643   CREATININE 0.85 05/24/2021 1108   CALCIUM 9.0 11/27/2021 0643   PROT 6.9 11/27/2021 0643   ALBUMIN 3.9 11/27/2021 0643   AST 37 11/27/2021 0643   ALT 54 (H) 11/27/2021 0643   ALKPHOS 85 11/27/2021 0643   BILITOT 0.8 11/27/2021 0643   GFRNONAA >60 11/27/2021 0643   GFRNONAA 72 05/24/2021 1108   GFRAA 83 05/24/2021 1108       Component Value Date/Time   WBC 4.5 05/24/2021 1108   RBC 3.83 05/24/2021 1108   HGB 12.4 05/24/2021 1108   HCT 37.0 05/24/2021 1108   PLT 295 05/24/2021 1108   MCV 96.6 05/24/2021 1108   MCH 32.4 05/24/2021 1108   MCHC 33.5 05/24/2021 1108   RDW 11.8 05/24/2021 1108   LYMPHSABS 1,148 08/27/2020 1126   MONOABS 0.6 03/23/2018 1244   EOSABS 34 08/27/2020 1126   BASOSABS 17 08/27/2020 1126    No results found for: POCLITH, LITHIUM   No results found for: PHENYTOIN, PHENOBARB, VALPROATE, CBMZ   .res Assessment: Plan:    Nithya was seen today for follow-up, depression, anxiety and sleeping problem.  Diagnoses and all orders for this visit:  Severe episode of recurrent major depressive disorder, without psychotic features ([place]) -     desvenlafaxine (PRISTIQ) 50 MG 24 hr tablet; Take 1 tablet (50 mg total) by mouth daily.  Generalized anxiety disorder -     topiramate (TOPAMAX) 50 MG tablet; Take 3 tablets (150 mg total) by mouth 2 (two) times daily.  Panic disorder with agoraphobia -      topiramate (TOPAMAX) 50 MG tablet; Take 3 tablets (150 mg total) by mouth 2 (two) times daily.  Pseudobulbar affect  Complicated grief  Insomnia due to mental condition -     zolpidem (AMBIEN) 10 MG tablet; Take 1/2 to 1 tablet (5-10 mg total) by mouth at bedtime as needed.  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 We discussed Disc And her ongoing treatment resistant depression and anxiety.  She has failed to respond to SSRIs SNRIs and various atypicals. Disc she has failed to respond to 13 different psychiatric meds.   Was Markedly better with imipramine 100 mg combined with duloxetine 60.  However, she Relapsed again after son distanced emotionally from her so she's more alone.  Hospitalized for mental health reasons December 2022  Ongoing multiple phone calls between appts. Disc this with her and what's appropriate.  Rec ECT or [place]. Disc in detail each procedure.  She refuses.   Dependency is markedly complicating treatment.  Hold trazodone bc unlikely to be needed HS with Thorazine.  Rec antipsychotic.  She's scared  of antipsychotics despite at this point poor prognosis for response to antidepressant alone.  Afraid of weight gain. Increase Thorazine 25 mg AM and 75 mg HS (was on 150 mg in hospital)  Described rumination to her again in detail that this was something that was difficult for her to understand.  It is clearly evident in her case.  This particular symptom increases the need for an antipsychotic.  It is unlikely that an antidepressant alone will address the symptom or her depression in general  Nuedexta markedly helpful for crying until recent visit &  crying worsened again.  May get worse if stopped but will try to DC due to inadequate response.  Continue gabapentin 800 BID if she can remember  for TR anxiety.     Continue Pristiq 50 mg daily which was started at the hospital.  She is not using the lorazepam excessively.  She is taking it appropriately.  The diazepam is prescribed vaginally by her gynecologist for chronic pelvic pain and not being used. We discussed the short-term risks associated with benzodiazepines including sedation and increased fall risk among others.  Discussed long-term side effect risk including dependence, potential withdrawal symptoms, and the potential eventual dose-related risk of dementia.  Disc the risk of Ambien amnesia.  Increase Topiramate  150 mg BID.   To help weight and anxiety off label.  It was helping with weight loss and she's tolerating it.  Behavioral health increase the dose from 100 to 125 mg twice daily but this is not a very convenient dose to take so we will adjust it to a more convenient dose which may provide some additional benefit as well.  She is tolerating it well.  It also is used for HA.    Treated for OSA by Dr. Baird Lyons.  Had Stopped counseling but agreed to restart counseling.  May need PCP but would have to drive to [facility] for it and it's not really availab.e Also could really benefit from support group.  Disc this at length.  If retires disc the importance of picking good Medicare D plan that will cover the Nudexta in case it's needed.  Continue medical leave until 01/25/2022  Also needs to continue social contact.  Rec DBT for TR anxiety and depression and lack of response to other [place]  Needs referral bc is clearly not getting better.   She was given a termination letter in the third week of December which indicated she had to find another provider in the next 8 weeks and that she should act on that immediately.  He can take some time to find a new provider.  Various names were given to her as options including the following: Get appt with Dr. Launa Grill, [facility] or Dr. Chucky May Or [facility] Psychiatry  Department Levonne Spiller, MD   Psychiatry   [facility] at [place]. [address], [address]. [place] , [place] [address]. [phone]. OR refer to [facility] or [facility].    Disc she cannot make excuses for not getting another psychiatrist.  She must do this.  This appt was 30 mins.  FU 4 weeks  Lynder Parents, MD, DFAPA  Please see After Visit Summary for patient specific instructions.    Future Appointments  Date Time Provider [place]  01/24/2022  4:00 PM Cottle, Billey Co., MD CP-[facility] None    No orders of the defined types were placed in this encounter.      -------------------------------

## 2021-12-21 NOTE — Patient Instructions (Addendum)
3 of the topiramate 50 mg tablets in the AM and before evening meal Increase chlorpromazine to 1 in the AM and 3 at night Stop trazodone  Call [facility], ground floor  I would recommend seeking a DBT group (dialetical behavior therapy)

## 2021-12-22 ENCOUNTER — Other Ambulatory Visit (HOSPITAL_COMMUNITY): Payer: Self-pay

## 2021-12-22 ENCOUNTER — Telehealth: Payer: Self-pay | Admitting: Psychiatry

## 2021-12-22 NOTE — Telephone Encounter (Signed)
Also updated pt's [employer] forms with information from yesterday's visit and the out of work date. This was signed by Dr. Clovis Pu and faxed twice to the # listed in previous message.

## 2021-12-22 NOTE — Telephone Encounter (Signed)
[name] [name] had an appt late yesterday w/CC. She will need a note sent to [employer] to extend her leave until 01/04/2022. This is indicated in CC's note. Please send to Attn: [employer] [phone], REF # [phone] Needs to be sent today since her leave ended yesterday.Note drafted and places in CC box to sign. Call pt when sent.

## 2021-12-23 ENCOUNTER — Other Ambulatory Visit (HOSPITAL_COMMUNITY): Payer: Self-pay

## 2021-12-23 ENCOUNTER — Telehealth: Payer: Self-pay | Admitting: Psychiatry

## 2021-12-23 DIAGNOSIS — F332 Major depressive disorder, recurrent severe without psychotic features: Secondary | ICD-10-CM | POA: Diagnosis not present

## 2021-12-23 DIAGNOSIS — F411 Generalized anxiety disorder: Secondary | ICD-10-CM | POA: Diagnosis not present

## 2021-12-23 MED ORDER — DESVENLAFAXINE SUCCINATE ER 100 MG PO TB24
ORAL_TABLET | ORAL | 0 refills | Status: DC
  Filled 2021-12-23: qty 21, 21d supply, fill #0

## 2021-12-23 NOTE — Telephone Encounter (Signed)
We have received an [employer] Stmt from [place] for pt. Given to nurse to complete.

## 2021-12-24 ENCOUNTER — Other Ambulatory Visit (HOSPITAL_COMMUNITY): Payer: Self-pay

## 2021-12-24 NOTE — Telephone Encounter (Signed)
Forms completed, signed and faxed to the [facility]

## 2021-12-25 ENCOUNTER — Other Ambulatory Visit (HOSPITAL_COMMUNITY): Payer: Self-pay

## 2021-12-29 ENCOUNTER — Encounter: Payer: Self-pay | Admitting: Internal Medicine

## 2021-12-29 DIAGNOSIS — Z634 Disappearance and death of family member: Secondary | ICD-10-CM | POA: Diagnosis not present

## 2021-12-29 DIAGNOSIS — F331 Major depressive disorder, recurrent, moderate: Secondary | ICD-10-CM | POA: Diagnosis not present

## 2021-12-29 DIAGNOSIS — F411 Generalized anxiety disorder: Secondary | ICD-10-CM | POA: Diagnosis not present

## 2021-12-31 ENCOUNTER — Telehealth: Payer: Self-pay

## 2021-12-31 NOTE — Telephone Encounter (Signed)
Rtc to pt and she reports she is staying so drowsy with her thorazine. She is taking 25 mg in the am and 75 mg at hs. She reports every time she sits down she is dozing off, she is trying to stay active but it's hard. She said the last 2 nights she has only taken 50 mg at hs. She also reports the doctor started her on the progesterone patch which also can make her sleepy. She isn't taking the trazodone at hs per Dr. Clovis Pu but not sleeping well. Advised her that is because she is sleeping through out the day some. She did agree. Dr. Clovis Pu is notified and recommends she take the 25 mg after work, she isn't working currently but she says she will take it around 5 pm. She is advised to take the 3 at hs like ordered, she will take just 2 this weekend to get adjusted then call me back next week with update.  She also reports she went downstairs to see a PA at [facility] on the first floor of the building, per Dr. Clovis Pu request. They took her history and advised her to increase her Pristiq to 100 mg, she wanted to make sure Dr. Clovis Pu agreed. I did inform him and he agreed to have her increase it.  Pt very pleasant on the phone, no crying and was very appreciate of the return phone calls.

## 2021-12-31 NOTE — Telephone Encounter (Signed)
Also to add to her note, pt is scheduled to see a psychiatrist with [facility] the office downstairs prior to the apt scheduled with Dr. Clovis Pu the beginning of February.

## 2022-01-05 ENCOUNTER — Other Ambulatory Visit (HOSPITAL_COMMUNITY): Payer: Self-pay

## 2022-01-05 ENCOUNTER — Other Ambulatory Visit: Payer: Self-pay

## 2022-01-05 DIAGNOSIS — F331 Major depressive disorder, recurrent, moderate: Secondary | ICD-10-CM | POA: Diagnosis not present

## 2022-01-05 DIAGNOSIS — F4001 Agoraphobia with panic disorder: Secondary | ICD-10-CM

## 2022-01-05 DIAGNOSIS — Z634 Disappearance and death of family member: Secondary | ICD-10-CM | POA: Diagnosis not present

## 2022-01-05 DIAGNOSIS — F411 Generalized anxiety disorder: Secondary | ICD-10-CM | POA: Diagnosis not present

## 2022-01-05 MED ORDER — LORAZEPAM 1 MG PO TABS
ORAL_TABLET | Freq: Four times a day (QID) | ORAL | 0 refills | Status: DC | PRN
  Filled 2022-01-05: qty 60, 15d supply, fill #0

## 2022-01-05 NOTE — Telephone Encounter (Signed)
Pt called back with an update, she reports that she has been having to help her daughter with the baby because they have been struggling with colic. She reports trying the 25 mg Thorazine around 5 pm and by 5:30 pm pt feels so drowsy she sleeps until 9 pm, then tries to stay up later to take the next dose of 50 mg Thorazine at hs, along with Ambien 10 mg and 1 mg Ativan. She reports not sleeping as well at night due to not taking trazodone but I informed her if she is sleeping during the day she won't sleep as well at night. Also if the 25 mg makes her drowsy then she should sleep better with the 50 mg. She is thinking maybe taking the 25 mg Thorazine after her am dose of thyroid and she won't be as sleepy the next day. She does have apt on Feb. 1st downstairs with Dr. Suella Broad, she seems hesitant but will go to apt. She has f/u with Dr. Clovis Pu on Feb. 13th.   Informed her I would update Dr. Clovis Pu and follow up.

## 2022-01-05 NOTE — Telephone Encounter (Addendum)
Noted .  She is likely to continue cutting back on thorazine regardless of what is suggested based on past history. She can move all the Thorazine to bedtime, if necessary DT sleepiness.  It is likely to be less helpful for daytime anxiety if she only takes it at night.  So keep the dosage at night as high as possible.  She was discharged from the hospital taking 150 mg of Thorazine a day.

## 2022-01-07 DIAGNOSIS — H25813 Combined forms of age-related cataract, bilateral: Secondary | ICD-10-CM | POA: Diagnosis not present

## 2022-01-07 DIAGNOSIS — H401134 Primary open-angle glaucoma, bilateral, indeterminate stage: Secondary | ICD-10-CM | POA: Diagnosis not present

## 2022-01-07 NOTE — Telephone Encounter (Signed)
Rtc to patient and she reports her daughter is really struggling with post partum with the baby being 34 weeks. [name] [name] has really had to jump in and help which is been very good for her. When she's alone she gets very anxious. Pt is trying to adjust and take the thorazine so she's not so sleepy. She isn't able to take in the am or in the evening. She is taking at night.  She was very appreciate for a late return phone call.

## 2022-01-10 ENCOUNTER — Other Ambulatory Visit (HOSPITAL_COMMUNITY): Payer: Self-pay

## 2022-01-10 MED ORDER — NP THYROID 60 MG PO TABS
60.0000 mg | ORAL_TABLET | Freq: Every day | ORAL | 1 refills | Status: AC
  Filled 2022-01-10: qty 90, 90d supply, fill #0

## 2022-01-12 ENCOUNTER — Telehealth: Payer: Self-pay | Admitting: Psychiatry

## 2022-01-12 ENCOUNTER — Other Ambulatory Visit (HOSPITAL_COMMUNITY): Payer: Self-pay

## 2022-01-12 DIAGNOSIS — F607 Dependent personality disorder: Secondary | ICD-10-CM | POA: Diagnosis not present

## 2022-01-12 DIAGNOSIS — F331 Major depressive disorder, recurrent, moderate: Secondary | ICD-10-CM | POA: Diagnosis not present

## 2022-01-12 DIAGNOSIS — Z634 Disappearance and death of family member: Secondary | ICD-10-CM | POA: Diagnosis not present

## 2022-01-12 DIAGNOSIS — F411 Generalized anxiety disorder: Secondary | ICD-10-CM | POA: Diagnosis not present

## 2022-01-12 DIAGNOSIS — F332 Major depressive disorder, recurrent severe without psychotic features: Secondary | ICD-10-CM | POA: Diagnosis not present

## 2022-01-12 MED ORDER — DESVENLAFAXINE SUCCINATE ER 100 MG PO TB24
ORAL_TABLET | ORAL | 0 refills | Status: DC
  Filled 2022-01-12: qty 30, 30d supply, fill #0

## 2022-01-12 NOTE — Telephone Encounter (Signed)
[name] [name] called to update you on her care. She is trying to get a referral to see Dr. Dolan Amen from her PCP in [facility]. Probably can't be seen til April 5 by her.  Also, today she saw Dr. Suella Broad with [facility]. Her son took her to appt and attended session. Dr. Suella Broad recommended [place] for her at [facility] and partial hospitalization. She wants to know if she does [place] can she do it on your referral and keep the Disability ppwk under [facility] while she does it. She does not really like Dr. Suella Broad and not sure she wants to continue with him.

## 2022-01-12 NOTE — Telephone Encounter (Signed)
I have already written her a termination letter which she has received.  She is trying to get around that.  She has 1 appointment left here and I will not schedule any more appointments.  So no I am not referring her for [place].  I suggest she continue with Dr. Suella Broad until she finds a psychiatrist she likes better.

## 2022-01-13 DIAGNOSIS — R5383 Other fatigue: Secondary | ICD-10-CM | POA: Diagnosis not present

## 2022-01-13 DIAGNOSIS — I10 Essential (primary) hypertension: Secondary | ICD-10-CM | POA: Diagnosis not present

## 2022-01-13 DIAGNOSIS — F418 Other specified anxiety disorders: Secondary | ICD-10-CM | POA: Diagnosis not present

## 2022-01-13 DIAGNOSIS — F607 Dependent personality disorder: Secondary | ICD-10-CM | POA: Diagnosis not present

## 2022-01-13 DIAGNOSIS — E039 Hypothyroidism, unspecified: Secondary | ICD-10-CM | POA: Diagnosis not present

## 2022-01-13 DIAGNOSIS — F4001 Agoraphobia with panic disorder: Secondary | ICD-10-CM | POA: Diagnosis not present

## 2022-01-13 NOTE — Telephone Encounter (Signed)
Called patient and talked to her and let her know what Dr. Clovis Pu said.

## 2022-01-15 ENCOUNTER — Other Ambulatory Visit: Payer: Self-pay | Admitting: Psychiatry

## 2022-01-15 ENCOUNTER — Other Ambulatory Visit (HOSPITAL_COMMUNITY): Payer: Self-pay

## 2022-01-15 MED FILL — Gabapentin Tab 800 MG: ORAL | 30 days supply | Qty: 120 | Fill #1 | Status: AC

## 2022-01-17 ENCOUNTER — Other Ambulatory Visit (HOSPITAL_COMMUNITY): Payer: Self-pay

## 2022-01-17 MED ORDER — CHLORPROMAZINE HCL 25 MG PO TABS
50.0000 mg | ORAL_TABLET | Freq: Every evening | ORAL | 0 refills | Status: DC
Start: 2022-01-17 — End: 2022-02-25
  Filled 2022-01-17: qty 180, 90d supply, fill #0

## 2022-01-17 MED ORDER — NEBIVOLOL HCL 10 MG PO TABS
10.0000 mg | ORAL_TABLET | Freq: Two times a day (BID) | ORAL | 0 refills | Status: AC
  Filled 2022-01-17: qty 180, 90d supply, fill #0

## 2022-01-18 ENCOUNTER — Other Ambulatory Visit (HOSPITAL_COMMUNITY): Payer: Self-pay

## 2022-01-19 ENCOUNTER — Telehealth: Payer: Self-pay | Admitting: Psychiatry

## 2022-01-19 ENCOUNTER — Other Ambulatory Visit (HOSPITAL_COMMUNITY): Payer: Self-pay

## 2022-01-19 NOTE — Telephone Encounter (Signed)
See phone note. I called patient and she is worried about not having medications after her visit with you on Monday. I told her that normally we bridge medications to give someone time to find another provider. (I'm not sure what our policy is on length of time we will bridge so I didn't mention that.) She is most concerned about her Ambien. I told her it was a controlled medication and I did not know how you would handle refills for this. She said it was not a controlled medication and was not receptive to me repeating that it was indeed a controlled medication. I told her I would send you a message in regards to refills on her medications. Her last refill on Ambien was 1/13.

## 2022-01-19 NOTE — Telephone Encounter (Signed)
Pt called and spoke with Mickel Baas today. She says that Dr. Suella Broad is not going to take her as a patient. She says she cannot go to [facility] because of the cost. She also says she needs Dr. Clovis Pu to write her a prescription.

## 2022-01-21 ENCOUNTER — Other Ambulatory Visit: Payer: Self-pay

## 2022-01-21 ENCOUNTER — Other Ambulatory Visit (HOSPITAL_COMMUNITY): Payer: Self-pay

## 2022-01-21 ENCOUNTER — Other Ambulatory Visit: Payer: Self-pay | Admitting: Psychiatry

## 2022-01-21 DIAGNOSIS — F5105 Insomnia due to other mental disorder: Secondary | ICD-10-CM

## 2022-01-21 DIAGNOSIS — F4001 Agoraphobia with panic disorder: Secondary | ICD-10-CM

## 2022-01-21 MED ORDER — ZOLPIDEM TARTRATE 10 MG PO TABS
5.0000 mg | ORAL_TABLET | Freq: Every evening | ORAL | 0 refills | Status: AC | PRN
Start: 2022-01-21 — End: ?
  Filled 2022-01-21: qty 30, 30d supply, fill #0

## 2022-01-22 ENCOUNTER — Other Ambulatory Visit (HOSPITAL_COMMUNITY): Payer: Self-pay

## 2022-01-24 ENCOUNTER — Encounter: Payer: Self-pay | Admitting: Psychiatry

## 2022-01-24 ENCOUNTER — Ambulatory Visit (INDEPENDENT_AMBULATORY_CARE_PROVIDER_SITE_OTHER): Payer: 59 | Admitting: Psychiatry

## 2022-01-24 ENCOUNTER — Other Ambulatory Visit (HOSPITAL_COMMUNITY): Payer: Self-pay

## 2022-01-24 ENCOUNTER — Other Ambulatory Visit: Payer: Self-pay

## 2022-01-24 ENCOUNTER — Ambulatory Visit: Payer: 59 | Admitting: Psychiatry

## 2022-01-24 DIAGNOSIS — F332 Major depressive disorder, recurrent severe without psychotic features: Secondary | ICD-10-CM | POA: Diagnosis not present

## 2022-01-24 DIAGNOSIS — F4321 Adjustment disorder with depressed mood: Secondary | ICD-10-CM

## 2022-01-24 DIAGNOSIS — F4001 Agoraphobia with panic disorder: Secondary | ICD-10-CM | POA: Diagnosis not present

## 2022-01-24 DIAGNOSIS — F5105 Insomnia due to other mental disorder: Secondary | ICD-10-CM

## 2022-01-24 DIAGNOSIS — F411 Generalized anxiety disorder: Secondary | ICD-10-CM

## 2022-01-24 DIAGNOSIS — F482 Pseudobulbar affect: Secondary | ICD-10-CM

## 2022-01-24 DIAGNOSIS — G4733 Obstructive sleep apnea (adult) (pediatric): Secondary | ICD-10-CM | POA: Diagnosis not present

## 2022-01-24 MED ORDER — TRAZODONE HCL 50 MG PO TABS
50.0000 mg | ORAL_TABLET | Freq: Every day | ORAL | 0 refills | Status: AC
  Filled 2022-01-24: qty 30, 30d supply, fill #0

## 2022-01-24 MED ORDER — DESVENLAFAXINE SUCCINATE ER 100 MG PO TB24
ORAL_TABLET | ORAL | 0 refills | Status: AC
  Filled 2022-01-24: qty 30, fill #0

## 2022-01-24 MED ORDER — LORAZEPAM 1 MG PO TABS
1.0000 mg | ORAL_TABLET | Freq: Three times a day (TID) | ORAL | 1 refills | Status: AC | PRN
  Filled 2022-01-24 – 2022-01-27 (×2): qty 90, 30d supply, fill #0

## 2022-01-24 NOTE — Progress Notes (Signed)
[name] [name] [id number] Feb 07, 1956 66 y.o.    Subjective:   Patient ID:  SWAN FAIRFAX is a 66 y.o. (DOB 22-Apr-1956) female.  Chief Complaint:  Chief Complaint  Patient presents with   Follow-up   Depression   Anxiety    Depression        Associated symptoms include myalgias and headaches.  Associated symptoms include no decreased concentration, no fatigue and no suicidal ideas. Medication Refill Associated symptoms include abdominal pain, headaches and myalgias. Pertinent negatives include no chest pain, fatigue or weakness.  NICKCOLE BRALLEY presents to the office today for follow-up of anxiety.  seen April 2020.  She was dealing with grief and depression and sertraline was increased back to 200 mg daily.  [name] died in October 02, 2018  from cancer.  Married 41 years.  Is able to focus on positive memories.  Not ready to move.  seen August 2020.  No meds were changed.  She continued sertraline 200 mg daily.  seen January 2020.  Sertraline was unchanged.  She was having still grief issues related to the loss of her husband.  She was also having problems with weight attributed to the sertraline.  She was given a trial of topiramate to try to help offset some of the weight gain plus it can have some potential antianxiety effects.  seen 02/19/20 stating: Very depressed. So sad.  Don't want to get OOB.  Got lost coming here and late.  Worse for 3-4 weeks.  Days off are terrible.  Barely able to function at work.  Cry all the way home after work.  So lonely.  Kids aren't coming like they were bc they are busy.  Need to figure out her own life.  Life was centered around H and kids.  Denies suicidal thoughts  Made following med changes: Increase sertraline back to 300 mg daily.  She's aware it's higher than the expected usual dose. Rexulti 2 mg daily Topiramate increase to target 50 mg BID and possibly higher for obesity.  03/18/2020 appointment the following is noted: No meds  were changed. She didn't increase sertraline bc fear of weight gain.  Quite a bit different.  No crying.  Sad a few times.  Better in 5 days.  Feels much less depressed.  Better energy, interest, enjoyment.  Also weather helping and started planting.  More motivation.  Desperate to lose weight.  Has some chronic pain and taking gabapentin.   Started gym cycle and lost some weight.   Worried over D with depression and conflict with pt.  D was always closer to father than her.  D distanced from her and recent bad outburst with her.  D recently blocked communication with her.   Disc getting her help.  05/19/2020 appointment with the following noted: Tried to self medicate bc felt good on Rexulti and tried reducing sertraline to 150 for 2 weeks and got worse.  So increased back to 100 mg BID. Stopped cable and got too bored for 3 mos.  Restarted cable yesterday.   Still grieving.  Kids want her to sell the house but she can't. Anxiety in the morning fidgety.  Taken more Ativan lately with more panic and worry over the future.  Almost like too much caffeine before any.  Limits it. Better if busy. Does better with working.  Was too sleepy with progesterone. Successful losing weight on the topiramate. Assessment/Plan: Her panic disorder is generally controlled but more anxious somewhat..She is still dealing  with a lot of grief with waves of depression and anxiety and at times easily overwhelmed but functional.   trial Rexulti 3 mg daily for 2 weeks to see if can gain more benefit for depression and anxiety. If no benefit then reduce Rexulti back to 2 mg.  She'll call  06/01/2020 phone call reporting that 3 mg Rexulti seem to be helping and she wanted to continue it. 06/03/2020, 2 days later she called back stating it was making her too sleepy.  She was planning a trip and she was encouraged to stop the medicine until she returns from her trip and we would reevaluate. 06/11/2020 phone call stating after stopping  the Rexulti she was said, depressed and crying all the time and wanted to return to Rexulti at 1 mg daily to which that was agreed. 07/02/2020 she called back wanting to milligram tablets of Rexulti because she had increased it on her own to that dosage and felt better.  She also had stopped drinking wine in the evening which she felt like helped.  So at her request a prescription was submitted for 2 mg Rexulti to the pharmacy. 07/16/2020 phone call stating she was very lonely and wanted to go back up to 3 mg of Rexulti.  That request was refused because of her previous side effects and making frequent changes with a med with a very long half-life like Rexulti is not a good idea.  07/21/2020 appointment with the following noted: Pretty desperate to do something. She increased Rexulti AMA to 3 mg despite being asked not to do so. Still sleepy and anxious.  Stopped wine for several weeks Ativan 1 -2 mg daily at work bc very anxious. Yesterday when son was there she wasn't sleepy or depressed until he left. Denies drowsiness when driving. Feels no better with grief. Plan: Stop Rexulti DT sleepiness.   Start Latuda 20 mg daily but she refuses bc doesn't think she can eat enough with it.  Then says she'll try it.  Topiramate She would like to increase further; OK increase to 75 mg BID.  No therapy since husband died. Needs counseling desperately.  She commits.  09/22/2020 appointment with the following noted Multiple phone calls since the last visit in crisis. Tried Latuda up to 40 mg a day with 3 mg Ativan daily.  She felt like it made her worse and stopped it. Recommended haloperidol 2 mg tablets 1-2 nightly due to extreme unmanageable anxiety and fearfulness.   08/18/2020 telephone call with the following noted:Tried haloperidol 1 mg and didn't help anything.  No effect except a little sleepiness.  Took it sparingly bc afraid of it.  Afraid of tremors and TD.  I didn't to give it a good chance.   Strongly rec ECT.  Currently will consider and agrees to go to [facility] for consult.  I'm scared.  Scared of meds too.  Currently on sertraline 200 mg daily.  Plan: Reduce sertraline to 150 daily and start duloxetine 30 mg capsule daily for 5 days, Then reduce sertraline to 100 mg and increase duloxetine to 2 capsules daily for 5 days, Then reduce sertraline to 1/2 of 100 mg aily and increase duloxetine to 90 mg daily for 5 days then stop sertraline and continue duloxetine 90 mg daily. Disc SE.  09/09/2020 extended phone call in crisis with nurse.  She was still transitioning from sertraline to duloxetine and trazodone was added for sleep. She's refusing ECT consult with [facility] and didn't go. She  has found a new therapist and seen her several times and she's local.  She is hopeful it might help. On duloxetine 90 mg since about 09/02/20. Never took buspirone but did receive it. Takes Ativan. Taking topiramate 75 mg in AM and on 50 mg PM.  Never increased to 75 mg BID. Takes Ambien initially and trazodone 25 mg HS and sleep not as good with duloxetine vs Zoloft.  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  Before that was sobbing and often not staying with herself. FMLA filled out for accomodation for leave if crying spells are unmanageable. Used to call H when she got in the car and son offered to have her call but he's less available.  So often cries in the car but onlly one bad crying spell after Nudexta. Hx migraine tx by neurologist who said MRI showed spots suggesting head injury.  Pt never remembers history of head injury. 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 Tomorrow is her and H's birthday and having family party tonight. Anniversary of 2 years is 10/25 and doing better about staying alone in her house. Uncertain mood effect from change to duloxetine so far but thinks maybe she's a little better. Doesn't  tolerate being alone very well.  Worry about winter. Plan continue Nuedexta was helpful for crying spells though it did not resolve them Continue duloxetine 90 mg to give it more time to work as it is only been about 3 weeks Okay per her request to increase topiramate to 75 mg twice daily.  10/26/2020 appointment with the following noted: Takes Ativan in morning.  Drinks more caffeine at home than work.  Might cause some anxiety.  But anxious at work too over insecurity and takes Ativan in morning also.  Takes it to drive. Average 2-3 Ativan daily.  Less anxious if not alone. On duloxetine 90 for 7 weeks. Crying spells still better on Nudexta than not.  Better than last time. Duloxetine is helping with depression and anxiety both are better than last visit.  Dep 5/10.  Anxiety  6/10 bc of work and driving.  Friends notice the benefit also. Not a winter person and dreads it. Not much appetite in evening but eats good lunch at work and breakfast.  Topiramate has curbed her appetite.   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   Work stressful and hates driving including in the winter and doesn't think she can do it anymore.   Sleep about 5 hours and sometimes can't get back to sleep on trazodone 50 and Ambien and Ativan but doesn't think she can take more trazodone DT hangover.  No amnesia.  Plan: Nuedexta markedly helpful for crying but hasn't resolved it. Duloxetine 90 on 7 weeks with benefit..  Increase dulxetine to max to see if can get more benefit bc sig residual sx. 120 mg daily.  11/25/2020 appointment with the following noted: Hard time. Since here.  Started crying a lot again.  Has been consistent with Nudexta.  Felt good for 3-4 days then last night had neg interaction with daughter while in men's section of [employer].  Got triggered over seeing men's clothes and D got mad.  She was crying and got a disoriented and had trouble finding her way out.  She doesn't understand her grief.  Crying for  weeks and then briefly better and recurred. Plan to retire Feb but scared to stay by herself.  Work is only socialization she  has and it's place she can enjoy things.   Never stayed alone before. I can't stay by myself.  Not afraid but lonely.  No hobbies. Plan: Nuedexta markedly helpful for crying until the last visit it started up again. Reduce Duloxetine to  90 mg bc increase didn't help after 4 weeks and the crying may be worse related to NE. Risperidone 1 mg BID prn   12/28/2020 appointment with the following noted: Was mainly taking gabapentin for pain and it's better sor reduced gabapentin to 2 daily for months.  Forgets to take it at work. Anxiety all day long. Risperidone prn bad crying day only once and it helped.  No SE Crying a lot since here when she is alone at home or in car.  Does not do it at work. Painful cry.  Sad and lonely.  Grief cry.   More anxious driving than she used to be. Using Ativan 1 mg average 2 daily.  Plan: Nuedexta markedly helpful for crying until the last visit it crying worsened again. Continue Duloxetine  90 mg bc increase didn't help after 4 weeks and the crying may be worse related to NE. 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  If fails after a week reduce back to current dose and instead increase Risperidone 1 mg BID.   01/27/2021 appt with following noted: Never  Remembered to take gabapentin QID but could take BID.  No SE. Since here more sad than anxious.  No panic lately. Still crying spells.  Only took risperidone once.  Most of crying in AM and off work at home alone. Is losing weight with topiramate and would like to go higher. Can feel alright when with family. Plan: Reduce duloxetine to 60 mg daily. Start imipramine 1 of the 25 mg tablets nightly for 1 week,  then 2 nightly for 1 week,  then 3 at night for 1 week. Wait 1 week and get the blood test, preferably in the morning.  02/25/2021 appointment with the  following noted: Still crying, horrible yesterday on the way home.  Hopeless.  Still doesn't like being alone.  Has a new counselor.  Was invited to church function tomorrow. SE cotton mouth.  awakens 2-3 times a night.  Negative thoughts about taking so much medicine. Kids and gkids in [place] and can't move away.   Angry over tracking devices.    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   Continue imipramine 75 Hs and duloxetine 60 for now and get blood test ASAP.  03/12/2021 telephone note" :tC to correct instructions on dosing of imipramine.  Her blood level of imipramine on imipramine 75 Hs and duloxetine 60 is 108.  The level needs to be about 200.  Therefore have her increase imipramine  to 100 mg for 5 nights, then increase to 150 mg.  When she needs it I will send in a prescription for imipramine 50 mg tablets 3 nightly which is double the current strength of her 25 mg tablets. She should continue the duloxetine 60 mg daily until we can achieve an adequate blood level of imipramine which this increase should accomplish in about 1 week after increasing to 150 mg daily.  She understood.  She'll call after on 150 mg for a week and we will repeat the lab.  Lynder Parents, MD, [facility]  04/20/2021 appointment with the following noted: Got Covid in April.  Back to work 2  weeks.   Took her 2 weeks to recover. On imipramine 150 mg about 3-4 nights but had SE.  Had agitated sleep and moved things around while asleep on this dose.  She backed down to 100 mg  SE dry mouth is uncomfortable. A whole lot of improvement with depression and anxiety on the med overall.  Weather changes will affect her mood.  Still takes Ativan at work.  Driving still bothers her if in traffic. Still very lonely but has gotten out more. 2 different Bible studies good.   D 67 yo is pregnant with her first. Son sees the benefit from imipramine.  He's FT minister. Less crying.   Plan: Markedly  better with imipramine 100 mg combined with duloxetine 60.  Disc DDI.   Repeat level  05/17/2021 appt noted: Didn't get blood level yet.   Emotionally doing OK. CO clenching teeth.  History of mouth guard made a good while ago and started using it again.   Started HA recently develops in the afternoon.  Doesn't have it when awakens.  Son noticed her clenching and grinding teeth.  Usually unaware of grinding teeth in the day.   Sleep is pretty good. Had a little more anxiety situationally.  Taken Ativan a little more. No full panic but some anxiety.   Went to [employer] where best friend is.  She noticed a big improvement.   SE dry mouth and bruxism. Doesn't like imipramine.   Missed one day of Nudexta and duloxetine and had a crying spell.  Otherwise those spells have been better. Plan: Markedly better with imipramine 100 mg combined with duloxetine 60.  Disc DDI.   Repeat level Then consider reducing duloxetine which might be contributing to the SE of bruxism. Stop trazodone and substitute cyclobenzaprine 10 mg HS for bruxism.  06/15/2021 appointment with following noted: Eye doc next week with extremely dry eyes from imipramine evidently.  FU next week. Doesn't want to stay on the medicine.  Disc her frustrations with the med even though she acknowledges the imipramine has helped her.  Residual depression and anxiety and chronic grief remained with fears about being alone.  She has been able to continue to work. Plan: Extensive discussion around the risk of stopping imipramine which is the only thing that has helped her recently.  Discussed alternatives. Reduce the imipramine to one of the 50 mg capsules for 1 week and then stop it Start Viibryd 20 mg tablet , start 1/2 tablet for 1 week, then 1 daily with food. Reduce duloxetine to 30 mg daily.  Will attempt to stop this.  DC cyclobenzaprine bc hangover  07/05/2021 phone call asking for refill of imipramine.  When questioned about it she  indicated she never stopped taking it despite what she indicated at the last visit.  07/19/2021 appointment with the following noted: Still on Cymbalta 60 mg daily, gabapentin 800 mg average twice daily bc forgets the rest, Lorazepam 1 mg average rarely. Topomax 100 mg BID, trazodone 50 mg HS, ambien 10 HS.   Not taken any Viibryd. Never stopped imipramine bc talked to son's who said she was so much better with the imipramine.  Talked to eye doctor which is better and maybe related to shingles.   Seeing dentist today with problems with teeth bc grinding her teeth. Crying all the time getting worse in the last few weeks.  Son's getting burned out on her neediness and she's not going to see him as frequently and so he's  less available which has made her more depressed and tearful. Had stopped therapy but plans to restart it bc needed.  I need to get out of the house.  Lonely. Has not checked on local support group lately. Depression is worse than last visit.  Didn't feel this level of depression last time but is experienced as extreme loneliness.  Neglecting house hold chores. Tries to talk on phone nightly with someone. Plan: Reduce the imipramine to one of the 50 mg capsules for 1 week and then stop it Start Viibryd 20 mg tablet , start 1/2 tablet for 1 week, then 1 daily with food. Reduce duloxetine to 30 mg daily for 2 weeks then stop it.  Will attempt to stop this. Rec antipsychotic.  She's scared of antipsychotics despite at this point poor prognosis for response to antidepressant alone.  Afraid of weight gain.  Rec trial of Lybalvi DT weight gain with olanzapine.  08/26/2021 phone call: Next visit is 09/27/21. [place] called in extreme anxiety with the Vybrid she has been on for six weeks. She states she is crying all the time. She would like to speak to someone regarding this. 08/26/2021 MD message:RTC  Anxiety worse after change Viibryd 20  and off imipramine.  She came off duloxetine but felt  bad and took a few for a several days. Did not take Lybalvi.  She looked up ruminating and didn't think that was what she was doing.  Plan: Increase Viibryd to 40 mg daily. Keep option of Lybalvi bc it can help TRD and anxiety based on the known benefit of olanzapine for the above. Asks for something for crying.  Nothing else to offer except antipsychotic and sh's not read to try it yet.  She agrees to increase Viibryd to 40 mg daily.  08/31/2021 phone call strings summarized as follows: Complaining of stomach pain and diarrhea which she attributed to Chinchilla. MD response: She has failed to respond to or not tolerated 13 different psychiatric medications.  She needs to be certain that the Viibryd is actually causing the diarrhea before she dismisses it as an option because the next option category is in the antipsychotic category and she is afraid of those medicines.  If she insists on coming off of Viibryd cut the dosage in half for 1 week and stop it. I will not start another antidepressant or consider another antidepressant until her appointment.  The only option I would consider is the suggestion at the last 2 appts of Lybalvi 5 mg and she can pick up samples.  If she refuses then we will wait until her scheduled appt or I can refer her for ECT at [facility] or [place] or [facility]  09/27/21 appt noted: Had bought of severe gastritis and has appt with GI Thursday.  Eating more with med helped. Refused Lybalvi. Still feels bad.  Seasonal depression with anniversary of H's death upcoming.   Equally bad when last here and now without noticeable change with Viibryd.  Dryness better off imipramine.  Has ongoing fears driving and maybe that is worse.   CO EMA and worse than last time. Difficulty enjoying things. Cont rumination on H's death chronically. Plan: Rec antipsychotic.  She's scared of antipsychotics despite at this point poor prognosis for response to antidepressant alone.  Afraid of weight  gain.  Rec trial of Lybalvi DT weight gain with olanzapine.  5 mg daily. Refuses but asks to retry Abilify 10 (failed in past with different antidepressant)  with Viibryd 40.  Nuedexta markedly helpful for crying until recent visit &  crying worsened again.  May get worse if stopped but will try to DC due to inadequate response.  11/17/2021 extended phone call with nurse; Rtc to pt and gave instructions on Trazodone then gave option of Seroquel. She is hesitant about raising Trazodone above 100 mg because she has gotten the hang over effect before. We then discussed Seroquel, she is hesitant but understands the low dose may be more effective for her sleep. After our discussion she will try trazodone 100 mg tonight then possibly 150 mg the next night. She did ask if the Seroquel could be sent in just in case she needed it and I was unavailable. Reassured her again of her options  11/25/21 appt noted: Took quetiapine 25 mg last night as only time but had hangover and felt like it affected driving. Bad day today. Took Abilify maybe once or twice but stopped it and can't remember why. Scared in her home now without reason.  Lonely and doesn't want to be alone. Some death thoughts without sui plans or intent. Deperssed and anxious.  12/21/2021 appointment with the following noted: The day after the last appointment during which an antipsychotic was recommended and refused and during which she was informed that she would have to find another psychiatrist due to lack of improvement, she went into behavioral health urgent care to be admitted for depression and anxiety with severe symptoms.  She was placed on Thorazine. CO sleepiness and reduced to Thorazine and taking 25 mg BID and 50 HS.  She then reduced 25 mg AM and 50 mg HS bc sleepy.  I know I need more. Also on Pristiq 50 mg daily, Nudexta BID, trazodone 50 HS, Ambien HS, topiramate 125 BID. Taking "more" lorazepam 1 BID and 1 prn anxiety. Awakens  anxious. Still crying a lot every day and doesn't think she can RTW yet. Chronic depression and anxiety. Trouble finding doctor in [place]..  Says she's working on finding another.   01/24/2022 appointment with the following noted: Don't have new doctor.  Dr.  Suella Broad refused to take her bc she wouldn't do day treatment he recommended.  [place].  Which she wouldn't do partly for $ reasons though insurance would cover most of it.  He also recommended [place] which she is considering. Dr. Suella Broad wrote her out of work. Trying to sleep to escape depression.  Ongoing depression. Spending time with new grand baby. Also decided to change counselors bc not progressing also and counselor agrees. Not as much SI lately.  No intent or plan.  Had some death thoughts and worthless thoughts but less lately.  Past Psychiatric Medication Trials:  Zoloft 300, Lexapro, paroxetine 80 in 2017, Duloxetine 120 Imipramine 150 SE did have some benefit Nudexta helped crying for awhile until increase duloxetine and holidays Paliperidone, olanzapine, aripiprazole 20 mg, Rexulti 3 mg sleepy Latuda Thorazine 150 mg sleepy buspirone,  clonidine, topiramate, gabapentin, metformin,  phentermine,  trazodone, Ambien, Ativan, quetiapine 25 SE hangover Remote history topiramate for migraine 1998, Belviq helped lose 85#  Pt started [facility] 2016 H had ECT  Review of Systems:  Review of Systems  Constitutional:  Positive for activity change. Negative for fatigue and unexpected weight change.       Sweating  HENT:         Dry mouth  Eyes:  Positive for visual disturbance. Negative for pain.  Cardiovascular:  Negative for chest pain and palpitations.  Gastrointestinal:  Positive for  abdominal pain. Negative for diarrhea and rectal pain.  Musculoskeletal:  Positive for myalgias.  Neurological:  Positive for headaches. Negative for dizziness, tremors and weakness.  Psychiatric/Behavioral:  Positive for agitation,  behavioral problems and dysphoric mood. Negative for confusion, decreased concentration, hallucinations, self-injury, sleep disturbance and suicidal ideas. The patient is nervous/anxious. The patient is not hyperactive.    Medications: I have reviewed the patient's current medications.  Current Outpatient Medications  Medication Sig Dispense Refill   albuterol (PROAIR HFA) 108 (90 Base) MCG/ACT inhaler Inhale 2 puffs into the lungs every 6 (six) hours as needed for wheezing or shortness of breath. Use as needed only  if your can't catch your breath 18 g 3   amLODipine (NORVASC) 5 MG tablet Take 1 tablet by mouth daily 30 tablet 6   chlorproMAZINE (THORAZINE) 25 MG tablet Take 2 tablets by mouth at bedtime. 180 tablet 0   Cholecalciferol (VITAMIN D) 125 MCG (5000 UT) CAPS Take 5,000 Units by mouth daily.     Dextromethorphan-quiNIDine (NUEDEXTA) 20-10 MG capsule TAKE 1 CAPSULE BY MOUTH TWICE A DAY (Patient taking differently: Take 1 capsule by mouth daily.) 60 capsule 2   estradiol (VIVELLE-DOT) 0.05 MG/24HR patch Apply 1 patch topically twice a week. 24 patch 3   gabapentin (NEURONTIN) 800 MG tablet Take 1 tablet (800 mg total) by mouth 4 (four) times daily. (Patient taking differently: Take 800 mg by mouth 2 (two) times daily.) 120 tablet 1   hydrochlorothiazide (HYDRODIURIL) 12.5 MG tablet Take 1 tablet by mouth once a day 30 tablet 6   ipratropium-albuterol (DUONEB) 0.5-2.5 (3) MG/3ML SOLN Take 3 mLs by nebulization every 6 (six) hours as needed (breathing treatment).     mometasone (NASONEX) 50 MCG/ACT nasal spray PLACE 2 SPRAYS INTO EACH NOSTRIL DAILY. 17 g 3   montelukast (SINGULAIR) 10 MG tablet Take 1 tablet by mouth once a day 30 tablet 6   nebivolol (BYSTOLIC) 10 MG tablet TAKE 1 TABLET BY MOUTH 2 TIMES DAILY 180 tablet 0   neomycin-polymyxin b-dexamethasone (MAXITROL) 3.5-10000-0.1 OINT Place 1 application into the right eye at bedtime.     pantoprazole (PROTONIX) 40 MG tablet Take 1  tablet by mouth 2 (two) times daily. 180 tablet 1   progesterone (PROMETRIUM) 100 MG capsule Take 1 capsule by mouth once a day. 90 capsule 3   thyroid (NP THYROID) 60 MG tablet Take 1 tablet (60 mg total) by mouth daily. 90 tablet 1   thyroid (NP THYROID) 90 MG tablet Take 1 tablet (90 mg total) by mouth daily. 90 tablet 2   timolol (TIMOPTIC) 0.5 % ophthalmic solution Instill 1 drop in right eye twice a day 5 mL 3   topiramate (TOPAMAX) 50 MG tablet Take 3 tablets (150 mg total) by mouth 2 (two) times daily. 180 tablet 1   zolpidem (AMBIEN) 10 MG tablet Take 1/2 to 1 tablet (5-10 mg total) by mouth at bedtime as needed. 30 tablet 0   desvenlafaxine (PRISTIQ) 100 MG 24 hr tablet Take 1 tablet by mouth once a day. 30 tablet 0   [START ON 01/27/2022] LORazepam (ATIVAN) 1 MG tablet Take 1 tablet (1 mg total) by mouth every 8 (eight) hours as needed. (01/27/22) 90 tablet 1   traZODone (DESYREL) 50 MG tablet Take 1 tablet (50 mg total) by mouth at bedtime. 30 tablet 0   No current facility-administered medications for this visit.    Medication Side Effects: Other: ? sweating related.  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 - no current med.   Glucose intolerance (impaired glucose tolerance)    on Metformin   H/O hiatal hernia    "sliding"   History of echocardiogram    Echo 1/16: EF 60-65, no RWMA, Gr 1 DD, mild LAE, trivial pericardial eff post to heart   History of echocardiogram    Echo 11/17: EF 55-60, no RWMA,  Gr 1 DD, normal RV function.   History of palpitations    Holter 12/15: NSR, PACs   Hx of migraines    Hypothyroidism    Osteoarthritis    knees   Positional headache since 1997   if lies on left side    Family History  Problem Relation Age of Onset   Lung cancer Father 95       dies age 54   Emphysema Father    COPD Brother 6       died age 80   Stroke Mother    Hypertension Sister    Thyroid disease Sister        Graves Disease   Breast cancer Neg Hx     Social History   Socioeconomic History   Marital status: Widowed    Spouse name: Not on file   Number of children: Not on file   Years of education: Not on file   Highest education level: Not on file  Occupational History   Occupation: Land    Employer:   Tobacco Use   Smoking status: Never   Smokeless tobacco: Never  Vaping Use   Vaping Use: Never used  Substance and Sexual Activity   Alcohol use: Yes    Comment: occ   Drug use: No   Sexual activity: Yes    Partners: Male    Birth control/protection: Post-menopausal  Other Topics Concern   Not on file  Social History Narrative   Widowed late 2019 after 40+ years of marriage   3 sons one daughter   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   No/never tobacco no drug use 1 caffeinated beverage daily, occasional or rare glass of wine   Social Determinants of Health   Financial Resource Strain: Not on file  Food Insecurity: Not on file  Transportation Needs: Not on file  Physical Activity: Not on file  Stress: Not on file  Social Connections: Not on file  Intimate Partner Violence: Not on file    Past Medical History, Surgical history, Social history, and Family history were reviewed and updated as appropriate.   M history of Lewey Body Dementia  Please see review of systems for further details on the patient's review from today.   Objective:   Physical Exam:  LMP 12/12/2005   Physical  Exam Constitutional:      General: She is not in acute distress.    Appearance: She is obese.  Musculoskeletal:        General: No deformity.  Neurological:     Mental Status: She is alert and oriented to person, place, and time.     Cranial Nerves: No dysarthria.     Coordination: Coordination normal.  Psychiatric:        Attention and Perception: Attention and perception normal. She does not perceive auditory or visual hallucinations.        Mood and Affect: Mood is anxious and depressed. Affect is tearful. Affect is not labile, blunt, angry or inappropriate.        Speech: Speech normal.        Behavior: Behavior normal. Behavior is cooperative.        Thought Content: Thought content normal. Thought content is not paranoid or delusional. Thought content does not include homicidal or suicidal ideation. Thought content does not include suicidal plan.        Cognition and Memory: Cognition and memory normal.        Judgment: Judgment normal.     Comments: Insight fair Less ruminative today Ongoing depression      Lab Review:     Component Value Date/Time   NA 139 11/27/2021 0643   K 3.0 (L) 11/27/2021 0643   CL 104 11/27/2021 0643   CO2 26 11/27/2021 0643   GLUCOSE 130 (H) 11/27/2021 0643   BUN 16 11/27/2021 0643   CREATININE 0.97 11/27/2021 0643   CREATININE 0.85 05/24/2021 1108   CALCIUM 9.0 11/27/2021 0643   PROT 6.9 11/27/2021 0643   ALBUMIN 3.9 11/27/2021 0643   AST 37 11/27/2021 0643   ALT 54 (H) 11/27/2021 0643   ALKPHOS 85 11/27/2021 0643   BILITOT 0.8 11/27/2021 0643   GFRNONAA >60 11/27/2021 0643   GFRNONAA 72 05/24/2021 1108   GFRAA 83 05/24/2021 1108       Component Value Date/Time   WBC 4.5 05/24/2021 1108   RBC 3.83 05/24/2021 1108   HGB 12.4 05/24/2021 1108   HCT 37.0 05/24/2021 1108   PLT 295 05/24/2021 1108   MCV 96.6 05/24/2021 1108   MCH 32.4 05/24/2021 1108   MCHC 33.5 05/24/2021 1108   RDW 11.8 05/24/2021 1108   LYMPHSABS 1,148  08/27/2020 1126   MONOABS 0.6 03/23/2018 1244   EOSABS 34 08/27/2020 1126   BASOSABS 17 08/27/2020 1126    No results found for: POCLITH, LITHIUM   No results found for: PHENYTOIN, PHENOBARB, VALPROATE, CBMZ   .res Assessment: Plan:    Micaiah was seen today for follow-up, depression and anxiety.  Diagnoses and all orders for this visit:  Severe episode of recurrent major depressive disorder, without psychotic features ([place]) -     desvenlafaxine (PRISTIQ) 100 MG 24 hr tablet; Take 1 tablet by mouth once a day.  Generalized anxiety disorder  Panic disorder with agoraphobia -     LORazepam (ATIVAN) 1 MG tablet; Take 1 tablet (1 mg total) by mouth every 8 (eight) hours as needed. (01/27/22)  Pseudobulbar affect  Complicated grief  Insomnia due to mental condition -     traZODone (DESYREL) 50 MG tablet; Take 1 tablet (50 mg total) by mouth at bedtime.  Obstructive sleep apnea     History of ministrokes  Greater than 50% of 50 min face to face time with patient was spent on counseling and coordination of care. We discussed Disc And her ongoing treatment resistant depression and anxiety.  She has failed to respond to SSRIs SNRIs and various atypicals. Disc she  has failed to respond to 13 different psychiatric meds.   Was Markedly better with imipramine 100 mg combined with duloxetine 60.  However, she Relapsed again after son distanced emotionally from her so she's more alone.  This is her last appt here.  She was informed in writing weeks ago.  .  Will renew her current RX a little while longer to allow her more time to find a psychiatrist for FU.  She has appt with Dr. Jacolyn Reedy 02/21/22 at [facility] in [place]  Has appt at [facility] also.  Hospitalized for mental health reasons December 2022  Rec ECT or [place]. Disc in detail each procedure.  She refuses.   Dependency is markedly complicating treatment.  Hold trazodone bc unlikely to be needed HS with Thorazine.  Rec  antipsychotic.  She's scared of antipsychotics despite at this point poor prognosis for response to antidepressant alone.  Afraid of weight gain.  Take highest tolerated dose of thorazine 50-75 mg HS.   Described rumination to her again in detail that this was something that was difficult for her to understand.  It is clearly evident in her case.  This particular symptom increases the need for an antipsychotic.  It is unlikely that an antidepressant alone will address the symptom or her depression in general  Nuedexta markedly helpful for crying until recent visit &  crying worsened again.  May get worse if stopped but will try to DC due to inadequate response.  Continue gabapentin 800 BID if she can remember  for TR anxiety.    Continue Pristiq 100 mg daily which was started at the hospital. And increased recentlly  She is not using the lorazepam excessively.  She is taking it appropriately.   We discussed the short-term risks associated with benzodiazepines including sedation and increased fall risk among others.  Discussed long-term side effect risk including dependence, potential withdrawal symptoms, and the potential eventual dose-related risk of dementia.  Disc the risk of Ambien amnesia.  Continue Topiramate  150 mg BID.   To help weight and anxiety off label.  It was helping with weight loss and she's tolerating it.  Behavioral health increase the dose from 100 to 125 mg twice daily but this is not a very convenient dose to take so we will adjust it to a more convenient dose which may provide some additional benefit as well.  She is tolerating it well.  It also is used for HA.    Treated for OSA by Dr. Baird Lyons.  Had Stopped counseling but agreed to restart counseling.  May need PCP but would have to drive to [facility] for it and it's not really availab.e Also could really benefit from support group.  Disc this at length.  If retires disc the importance of picking good Medicare D plan  that will cover the Nudexta in case it's needed.  Continue medical leave until 01/25/2022  Also needs to continue social contact.  Rec DBT for TR anxiety and depression and lack of response to other TX  FU Dr. Joeseph Amor, MD, DFAPA  Please see After Visit Summary for patient specific instructions.    No future appointments.   No orders of the defined types were placed in this encounter.      -------------------------------

## 2022-01-27 ENCOUNTER — Other Ambulatory Visit (HOSPITAL_COMMUNITY): Payer: Self-pay

## 2022-01-28 DIAGNOSIS — Z634 Disappearance and death of family member: Secondary | ICD-10-CM | POA: Diagnosis not present

## 2022-01-28 DIAGNOSIS — F411 Generalized anxiety disorder: Secondary | ICD-10-CM | POA: Diagnosis not present

## 2022-01-28 DIAGNOSIS — F331 Major depressive disorder, recurrent, moderate: Secondary | ICD-10-CM | POA: Diagnosis not present

## 2022-01-29 ENCOUNTER — Other Ambulatory Visit (HOSPITAL_COMMUNITY): Payer: Self-pay

## 2022-01-30 ENCOUNTER — Other Ambulatory Visit (HOSPITAL_COMMUNITY): Payer: Self-pay

## 2022-01-31 ENCOUNTER — Other Ambulatory Visit (HOSPITAL_COMMUNITY): Payer: Self-pay

## 2022-02-02 DIAGNOSIS — F332 Major depressive disorder, recurrent severe without psychotic features: Secondary | ICD-10-CM | POA: Diagnosis not present

## 2022-02-03 DIAGNOSIS — F332 Major depressive disorder, recurrent severe without psychotic features: Secondary | ICD-10-CM | POA: Diagnosis not present

## 2022-02-04 ENCOUNTER — Other Ambulatory Visit (HOSPITAL_COMMUNITY): Payer: Self-pay

## 2022-02-04 DIAGNOSIS — F332 Major depressive disorder, recurrent severe without psychotic features: Secondary | ICD-10-CM | POA: Diagnosis not present

## 2022-02-04 DIAGNOSIS — F410 Panic disorder [episodic paroxysmal anxiety] without agoraphobia: Secondary | ICD-10-CM | POA: Diagnosis not present

## 2022-02-04 DIAGNOSIS — F411 Generalized anxiety disorder: Secondary | ICD-10-CM | POA: Diagnosis not present

## 2022-02-04 DIAGNOSIS — F482 Pseudobulbar affect: Secondary | ICD-10-CM | POA: Diagnosis not present

## 2022-02-04 DIAGNOSIS — F5105 Insomnia due to other mental disorder: Secondary | ICD-10-CM | POA: Diagnosis not present

## 2022-02-04 MED ORDER — SERTRALINE HCL 100 MG PO TABS
ORAL_TABLET | ORAL | 0 refills | Status: DC
  Filled 2022-02-04: qty 30, 30d supply, fill #0

## 2022-02-07 DIAGNOSIS — F332 Major depressive disorder, recurrent severe without psychotic features: Secondary | ICD-10-CM | POA: Diagnosis not present

## 2022-02-08 DIAGNOSIS — F332 Major depressive disorder, recurrent severe without psychotic features: Secondary | ICD-10-CM | POA: Diagnosis not present

## 2022-02-09 DIAGNOSIS — F332 Major depressive disorder, recurrent severe without psychotic features: Secondary | ICD-10-CM | POA: Diagnosis not present

## 2022-02-11 DIAGNOSIS — F332 Major depressive disorder, recurrent severe without psychotic features: Secondary | ICD-10-CM | POA: Diagnosis not present

## 2022-02-15 DIAGNOSIS — F332 Major depressive disorder, recurrent severe without psychotic features: Secondary | ICD-10-CM | POA: Diagnosis not present

## 2022-02-16 DIAGNOSIS — F332 Major depressive disorder, recurrent severe without psychotic features: Secondary | ICD-10-CM | POA: Diagnosis not present

## 2022-02-16 DIAGNOSIS — Z634 Disappearance and death of family member: Secondary | ICD-10-CM | POA: Diagnosis not present

## 2022-02-16 DIAGNOSIS — F331 Major depressive disorder, recurrent, moderate: Secondary | ICD-10-CM | POA: Diagnosis not present

## 2022-02-16 DIAGNOSIS — F411 Generalized anxiety disorder: Secondary | ICD-10-CM | POA: Diagnosis not present

## 2022-02-17 DIAGNOSIS — F332 Major depressive disorder, recurrent severe without psychotic features: Secondary | ICD-10-CM | POA: Diagnosis not present

## 2022-02-18 DIAGNOSIS — F332 Major depressive disorder, recurrent severe without psychotic features: Secondary | ICD-10-CM | POA: Diagnosis not present

## 2022-02-21 ENCOUNTER — Other Ambulatory Visit (HOSPITAL_COMMUNITY): Payer: Self-pay

## 2022-02-21 DIAGNOSIS — F5105 Insomnia due to other mental disorder: Secondary | ICD-10-CM | POA: Diagnosis not present

## 2022-02-21 DIAGNOSIS — F410 Panic disorder [episodic paroxysmal anxiety] without agoraphobia: Secondary | ICD-10-CM | POA: Diagnosis not present

## 2022-02-21 DIAGNOSIS — F411 Generalized anxiety disorder: Secondary | ICD-10-CM | POA: Diagnosis not present

## 2022-02-21 DIAGNOSIS — Z79899 Other long term (current) drug therapy: Secondary | ICD-10-CM | POA: Diagnosis not present

## 2022-02-21 DIAGNOSIS — F482 Pseudobulbar affect: Secondary | ICD-10-CM | POA: Diagnosis not present

## 2022-02-21 DIAGNOSIS — F332 Major depressive disorder, recurrent severe without psychotic features: Secondary | ICD-10-CM | POA: Diagnosis not present

## 2022-02-21 MED ORDER — NUEDEXTA 20-10 MG PO CAPS
ORAL_CAPSULE | ORAL | 2 refills | Status: DC
  Filled 2022-02-21: qty 30, 30d supply, fill #0

## 2022-02-21 MED ORDER — TRAZODONE HCL 50 MG PO TABS
50.0000 mg | ORAL_TABLET | Freq: Every evening | ORAL | 2 refills | Status: AC | PRN
  Filled 2022-02-21: qty 30, 30d supply, fill #0
  Filled 2022-03-19: qty 30, 30d supply, fill #1

## 2022-02-21 MED ORDER — ZOLPIDEM TARTRATE 10 MG PO TABS
10.0000 mg | ORAL_TABLET | Freq: Every evening | ORAL | 1 refills | Status: DC
  Filled 2022-02-21: qty 30, 30d supply, fill #0
  Filled 2022-03-21: qty 30, 30d supply, fill #1

## 2022-02-21 MED ORDER — SERTRALINE HCL 50 MG PO TABS
150.0000 mg | ORAL_TABLET | Freq: Every day | ORAL | 1 refills | Status: AC
  Filled 2022-02-21: qty 90, 30d supply, fill #0

## 2022-02-22 ENCOUNTER — Other Ambulatory Visit (INDEPENDENT_AMBULATORY_CARE_PROVIDER_SITE_OTHER): Payer: Self-pay

## 2022-02-22 ENCOUNTER — Other Ambulatory Visit (HOSPITAL_COMMUNITY): Payer: Self-pay

## 2022-02-22 DIAGNOSIS — F332 Major depressive disorder, recurrent severe without psychotic features: Secondary | ICD-10-CM | POA: Diagnosis not present

## 2022-02-22 MED ORDER — IBUPROFEN 800 MG PO TABS
ORAL_TABLET | ORAL | 3 refills | Status: AC
  Filled 2022-02-22: qty 60, 20d supply, fill #0

## 2022-02-23 ENCOUNTER — Other Ambulatory Visit (HOSPITAL_COMMUNITY): Payer: Self-pay

## 2022-02-23 DIAGNOSIS — F332 Major depressive disorder, recurrent severe without psychotic features: Secondary | ICD-10-CM | POA: Diagnosis not present

## 2022-02-23 MED ORDER — LORAZEPAM 1 MG PO TABS
ORAL_TABLET | ORAL | 1 refills | Status: AC
  Filled 2022-02-23 – 2022-02-24 (×2): qty 45, 30d supply, fill #0

## 2022-02-24 ENCOUNTER — Other Ambulatory Visit (HOSPITAL_COMMUNITY): Payer: Self-pay

## 2022-02-24 DIAGNOSIS — F332 Major depressive disorder, recurrent severe without psychotic features: Secondary | ICD-10-CM | POA: Diagnosis not present

## 2022-02-25 ENCOUNTER — Other Ambulatory Visit (HOSPITAL_COMMUNITY): Payer: Self-pay

## 2022-02-25 DIAGNOSIS — F411 Generalized anxiety disorder: Secondary | ICD-10-CM | POA: Diagnosis not present

## 2022-02-25 DIAGNOSIS — F332 Major depressive disorder, recurrent severe without psychotic features: Secondary | ICD-10-CM | POA: Diagnosis not present

## 2022-02-25 DIAGNOSIS — F331 Major depressive disorder, recurrent, moderate: Secondary | ICD-10-CM | POA: Diagnosis not present

## 2022-02-25 DIAGNOSIS — Z634 Disappearance and death of family member: Secondary | ICD-10-CM | POA: Diagnosis not present

## 2022-02-25 MED ORDER — CHLORPROMAZINE HCL 25 MG PO TABS
50.0000 mg | ORAL_TABLET | Freq: Every evening | ORAL | 1 refills | Status: DC
  Filled 2022-02-25: qty 60, 30d supply, fill #0

## 2022-02-28 DIAGNOSIS — F332 Major depressive disorder, recurrent severe without psychotic features: Secondary | ICD-10-CM | POA: Diagnosis not present

## 2022-03-01 ENCOUNTER — Other Ambulatory Visit (HOSPITAL_COMMUNITY): Payer: Self-pay

## 2022-03-01 DIAGNOSIS — F332 Major depressive disorder, recurrent severe without psychotic features: Secondary | ICD-10-CM | POA: Diagnosis not present

## 2022-03-01 MED ORDER — TOPIRAMATE 50 MG PO TABS
ORAL_TABLET | ORAL | 0 refills | Status: DC
  Filled 2022-03-01: qty 180, 30d supply, fill #0

## 2022-03-02 DIAGNOSIS — F332 Major depressive disorder, recurrent severe without psychotic features: Secondary | ICD-10-CM | POA: Diagnosis not present

## 2022-03-03 ENCOUNTER — Other Ambulatory Visit (HOSPITAL_COMMUNITY): Payer: Self-pay

## 2022-03-03 DIAGNOSIS — F332 Major depressive disorder, recurrent severe without psychotic features: Secondary | ICD-10-CM | POA: Diagnosis not present

## 2022-03-04 ENCOUNTER — Other Ambulatory Visit (HOSPITAL_COMMUNITY): Payer: Self-pay

## 2022-03-04 DIAGNOSIS — F332 Major depressive disorder, recurrent severe without psychotic features: Secondary | ICD-10-CM | POA: Diagnosis not present

## 2022-03-05 DIAGNOSIS — F411 Generalized anxiety disorder: Secondary | ICD-10-CM | POA: Diagnosis not present

## 2022-03-05 DIAGNOSIS — Z634 Disappearance and death of family member: Secondary | ICD-10-CM | POA: Diagnosis not present

## 2022-03-05 DIAGNOSIS — F331 Major depressive disorder, recurrent, moderate: Secondary | ICD-10-CM | POA: Diagnosis not present

## 2022-03-07 DIAGNOSIS — F332 Major depressive disorder, recurrent severe without psychotic features: Secondary | ICD-10-CM | POA: Diagnosis not present

## 2022-03-08 DIAGNOSIS — F332 Major depressive disorder, recurrent severe without psychotic features: Secondary | ICD-10-CM | POA: Diagnosis not present

## 2022-03-09 DIAGNOSIS — F332 Major depressive disorder, recurrent severe without psychotic features: Secondary | ICD-10-CM | POA: Diagnosis not present

## 2022-03-13 ENCOUNTER — Other Ambulatory Visit (HOSPITAL_COMMUNITY): Payer: Self-pay

## 2022-03-14 ENCOUNTER — Other Ambulatory Visit (HOSPITAL_COMMUNITY): Payer: Self-pay

## 2022-03-14 DIAGNOSIS — F332 Major depressive disorder, recurrent severe without psychotic features: Secondary | ICD-10-CM | POA: Diagnosis not present

## 2022-03-15 ENCOUNTER — Other Ambulatory Visit (HOSPITAL_COMMUNITY): Payer: Self-pay

## 2022-03-15 DIAGNOSIS — F332 Major depressive disorder, recurrent severe without psychotic features: Secondary | ICD-10-CM | POA: Diagnosis not present

## 2022-03-16 DIAGNOSIS — F332 Major depressive disorder, recurrent severe without psychotic features: Secondary | ICD-10-CM | POA: Diagnosis not present

## 2022-03-19 ENCOUNTER — Other Ambulatory Visit (HOSPITAL_COMMUNITY): Payer: Self-pay

## 2022-03-21 ENCOUNTER — Other Ambulatory Visit (HOSPITAL_COMMUNITY): Payer: Self-pay

## 2022-03-21 DIAGNOSIS — F332 Major depressive disorder, recurrent severe without psychotic features: Secondary | ICD-10-CM | POA: Diagnosis not present

## 2022-03-23 DIAGNOSIS — K219 Gastro-esophageal reflux disease without esophagitis: Secondary | ICD-10-CM | POA: Diagnosis not present

## 2022-03-23 DIAGNOSIS — G8929 Other chronic pain: Secondary | ICD-10-CM | POA: Diagnosis not present

## 2022-03-23 DIAGNOSIS — E039 Hypothyroidism, unspecified: Secondary | ICD-10-CM | POA: Diagnosis not present

## 2022-03-23 DIAGNOSIS — F419 Anxiety disorder, unspecified: Secondary | ICD-10-CM | POA: Diagnosis not present

## 2022-03-23 DIAGNOSIS — R001 Bradycardia, unspecified: Secondary | ICD-10-CM | POA: Diagnosis not present

## 2022-03-23 DIAGNOSIS — Z20822 Contact with and (suspected) exposure to covid-19: Secondary | ICD-10-CM | POA: Diagnosis not present

## 2022-03-23 DIAGNOSIS — R45851 Suicidal ideations: Secondary | ICD-10-CM | POA: Diagnosis not present

## 2022-03-23 DIAGNOSIS — I10 Essential (primary) hypertension: Secondary | ICD-10-CM | POA: Diagnosis not present

## 2022-03-23 DIAGNOSIS — R000 Tachycardia, unspecified: Secondary | ICD-10-CM | POA: Diagnosis not present

## 2022-03-23 DIAGNOSIS — F607 Dependent personality disorder: Secondary | ICD-10-CM | POA: Diagnosis not present

## 2022-03-23 DIAGNOSIS — F32A Depression, unspecified: Secondary | ICD-10-CM | POA: Diagnosis not present

## 2022-03-23 DIAGNOSIS — F411 Generalized anxiety disorder: Secondary | ICD-10-CM | POA: Diagnosis not present

## 2022-03-23 DIAGNOSIS — F332 Major depressive disorder, recurrent severe without psychotic features: Secondary | ICD-10-CM | POA: Diagnosis not present

## 2022-03-23 DIAGNOSIS — J45909 Unspecified asthma, uncomplicated: Secondary | ICD-10-CM | POA: Diagnosis not present

## 2022-03-24 DIAGNOSIS — R001 Bradycardia, unspecified: Secondary | ICD-10-CM | POA: Diagnosis not present

## 2022-03-24 DIAGNOSIS — F332 Major depressive disorder, recurrent severe without psychotic features: Secondary | ICD-10-CM | POA: Diagnosis not present

## 2022-03-24 DIAGNOSIS — F32A Depression, unspecified: Secondary | ICD-10-CM | POA: Diagnosis not present

## 2022-03-24 DIAGNOSIS — F419 Anxiety disorder, unspecified: Secondary | ICD-10-CM | POA: Diagnosis not present

## 2022-03-25 ENCOUNTER — Other Ambulatory Visit (HOSPITAL_COMMUNITY): Payer: Self-pay

## 2022-03-25 DIAGNOSIS — F332 Major depressive disorder, recurrent severe without psychotic features: Secondary | ICD-10-CM | POA: Diagnosis not present

## 2022-03-26 DIAGNOSIS — F419 Anxiety disorder, unspecified: Secondary | ICD-10-CM | POA: Diagnosis not present

## 2022-03-26 DIAGNOSIS — F32A Depression, unspecified: Secondary | ICD-10-CM | POA: Diagnosis not present

## 2022-03-26 DIAGNOSIS — F332 Major depressive disorder, recurrent severe without psychotic features: Secondary | ICD-10-CM | POA: Diagnosis not present

## 2022-03-27 DIAGNOSIS — F332 Major depressive disorder, recurrent severe without psychotic features: Secondary | ICD-10-CM | POA: Diagnosis not present

## 2022-03-27 DIAGNOSIS — F419 Anxiety disorder, unspecified: Secondary | ICD-10-CM | POA: Diagnosis not present

## 2022-03-27 DIAGNOSIS — F32A Depression, unspecified: Secondary | ICD-10-CM | POA: Diagnosis not present

## 2022-03-28 ENCOUNTER — Other Ambulatory Visit (HOSPITAL_COMMUNITY): Payer: Self-pay

## 2022-03-28 DIAGNOSIS — F411 Generalized anxiety disorder: Secondary | ICD-10-CM | POA: Diagnosis not present

## 2022-03-28 DIAGNOSIS — F332 Major depressive disorder, recurrent severe without psychotic features: Secondary | ICD-10-CM | POA: Diagnosis not present

## 2022-03-28 DIAGNOSIS — F607 Dependent personality disorder: Secondary | ICD-10-CM | POA: Diagnosis not present

## 2022-03-28 MED ORDER — HYDROXYZINE HCL 25 MG PO TABS
ORAL_TABLET | ORAL | 0 refills | Status: DC
  Filled 2022-03-28: qty 21, 7d supply, fill #0

## 2022-03-29 ENCOUNTER — Other Ambulatory Visit (HOSPITAL_COMMUNITY): Payer: Self-pay

## 2022-03-29 DIAGNOSIS — F411 Generalized anxiety disorder: Secondary | ICD-10-CM | POA: Diagnosis not present

## 2022-03-29 DIAGNOSIS — Z79899 Other long term (current) drug therapy: Secondary | ICD-10-CM | POA: Diagnosis not present

## 2022-03-29 DIAGNOSIS — F482 Pseudobulbar affect: Secondary | ICD-10-CM | POA: Diagnosis not present

## 2022-03-29 DIAGNOSIS — F410 Panic disorder [episodic paroxysmal anxiety] without agoraphobia: Secondary | ICD-10-CM | POA: Diagnosis not present

## 2022-03-29 DIAGNOSIS — G43909 Migraine, unspecified, not intractable, without status migrainosus: Secondary | ICD-10-CM | POA: Diagnosis not present

## 2022-03-29 DIAGNOSIS — F332 Major depressive disorder, recurrent severe without psychotic features: Secondary | ICD-10-CM | POA: Diagnosis not present

## 2022-03-29 DIAGNOSIS — F5105 Insomnia due to other mental disorder: Secondary | ICD-10-CM | POA: Diagnosis not present

## 2022-03-29 MED ORDER — TOPIRAMATE 50 MG PO TABS
ORAL_TABLET | ORAL | 1 refills | Status: AC
  Filled 2022-03-29: qty 180, 30d supply, fill #0

## 2022-03-29 MED ORDER — LORAZEPAM 1 MG PO TABS
ORAL_TABLET | ORAL | 0 refills | Status: AC
  Filled 2022-03-29: qty 60, 30d supply, fill #0

## 2022-03-29 MED ORDER — SERTRALINE HCL 100 MG PO TABS
ORAL_TABLET | ORAL | 1 refills | Status: AC
  Filled 2022-03-29: qty 60, 30d supply, fill #0

## 2022-03-29 MED ORDER — TRAZODONE HCL 100 MG PO TABS
ORAL_TABLET | ORAL | 1 refills | Status: AC
  Filled 2022-03-29: qty 45, 30d supply, fill #0

## 2022-03-29 MED ORDER — GABAPENTIN 400 MG PO CAPS
ORAL_CAPSULE | ORAL | 1 refills | Status: AC
  Filled 2022-03-29: qty 60, 30d supply, fill #0

## 2022-03-29 MED ORDER — HYDROXYZINE HCL 25 MG PO TABS
ORAL_TABLET | ORAL | 1 refills | Status: AC
  Filled 2022-03-29: qty 90, 30d supply, fill #0

## 2022-03-29 MED ORDER — LITHIUM CARBONATE ER 450 MG PO TBCR
EXTENDED_RELEASE_TABLET | ORAL | 1 refills | Status: AC
  Filled 2022-03-29: qty 30, 30d supply, fill #0

## 2022-03-31 ENCOUNTER — Other Ambulatory Visit (HOSPITAL_COMMUNITY): Payer: Self-pay

## 2022-03-31 DIAGNOSIS — F332 Major depressive disorder, recurrent severe without psychotic features: Secondary | ICD-10-CM | POA: Diagnosis not present

## 2022-04-02 ENCOUNTER — Other Ambulatory Visit (HOSPITAL_COMMUNITY): Payer: Self-pay

## 2022-04-05 DIAGNOSIS — F482 Pseudobulbar affect: Secondary | ICD-10-CM | POA: Diagnosis not present

## 2022-04-05 DIAGNOSIS — G43909 Migraine, unspecified, not intractable, without status migrainosus: Secondary | ICD-10-CM | POA: Diagnosis not present

## 2022-04-05 DIAGNOSIS — Z79899 Other long term (current) drug therapy: Secondary | ICD-10-CM | POA: Diagnosis not present

## 2022-04-05 DIAGNOSIS — F332 Major depressive disorder, recurrent severe without psychotic features: Secondary | ICD-10-CM | POA: Diagnosis not present

## 2022-04-05 DIAGNOSIS — F5105 Insomnia due to other mental disorder: Secondary | ICD-10-CM | POA: Diagnosis not present

## 2022-04-05 DIAGNOSIS — F411 Generalized anxiety disorder: Secondary | ICD-10-CM | POA: Diagnosis not present

## 2022-04-05 DIAGNOSIS — F410 Panic disorder [episodic paroxysmal anxiety] without agoraphobia: Secondary | ICD-10-CM | POA: Diagnosis not present

## 2022-04-14 ENCOUNTER — Other Ambulatory Visit (HOSPITAL_COMMUNITY): Payer: Self-pay

## 2022-04-15 ENCOUNTER — Other Ambulatory Visit (HOSPITAL_COMMUNITY): Payer: Self-pay

## 2022-05-05 ENCOUNTER — Other Ambulatory Visit (HOSPITAL_COMMUNITY): Payer: Self-pay

## 2022-05-05 MED ORDER — HYDROXYZINE HCL 25 MG PO TABS
ORAL_TABLET | ORAL | 1 refills | Status: AC
  Filled 2022-05-05: qty 90, 30d supply, fill #0

## 2022-05-05 MED ORDER — LITHIUM CARBONATE ER 450 MG PO TBCR
EXTENDED_RELEASE_TABLET | ORAL | 1 refills | Status: AC
  Filled 2022-05-05: qty 30, 30d supply, fill #0

## 2022-05-05 MED ORDER — TRAZODONE HCL 100 MG PO TABS
ORAL_TABLET | ORAL | 1 refills | Status: AC
  Filled 2022-05-05: qty 45, 30d supply, fill #0

## 2022-05-05 MED ORDER — SERTRALINE HCL 100 MG PO TABS
ORAL_TABLET | ORAL | 1 refills | Status: AC
  Filled 2022-05-05: qty 45, 30d supply, fill #0

## 2022-05-26 ENCOUNTER — Other Ambulatory Visit (HOSPITAL_COMMUNITY): Payer: Self-pay

## 2022-05-30 ENCOUNTER — Other Ambulatory Visit (HOSPITAL_COMMUNITY): Payer: Self-pay

## 2022-05-30 MED ORDER — ESTRADIOL 0.5 MG PO TABS
0.5000 mg | ORAL_TABLET | Freq: Every day | ORAL | 1 refills | Status: AC
  Filled 2022-05-30 – 2022-06-22 (×2): qty 90, 90d supply, fill #0

## 2022-06-06 ENCOUNTER — Other Ambulatory Visit (HOSPITAL_COMMUNITY): Payer: Self-pay

## 2022-06-22 ENCOUNTER — Other Ambulatory Visit (HOSPITAL_COMMUNITY): Payer: Self-pay

## 2023-02-20 ENCOUNTER — Other Ambulatory Visit: Payer: Self-pay | Admitting: Obstetrics and Gynecology

## 2023-03-14 ENCOUNTER — Other Ambulatory Visit (HOSPITAL_COMMUNITY): Payer: Self-pay

## 2023-05-01 ENCOUNTER — Ambulatory Visit: Payer: Commercial Managed Care - PPO | Admitting: Neurology

## 2023-07-19 ENCOUNTER — Ambulatory Visit: Payer: Commercial Managed Care - PPO | Admitting: Neurology

## 2025-01-14 ENCOUNTER — Other Ambulatory Visit: Payer: Self-pay | Admitting: Obstetrics and Gynecology

## 2025-01-14 DIAGNOSIS — R928 Other abnormal and inconclusive findings on diagnostic imaging of breast: Secondary | ICD-10-CM

## 2025-01-21 ENCOUNTER — Other Ambulatory Visit

## 2025-01-21 ENCOUNTER — Encounter
